# Patient Record
Sex: Male | Born: 1950
Health system: Southern US, Community
[De-identification: ages and names within clinical notes are randomized; demographics above are authoritative.]

## PROBLEM LIST (undated history)

## (undated) DIAGNOSIS — I2510 Atherosclerotic heart disease of native coronary artery without angina pectoris: Secondary | ICD-10-CM

## (undated) DIAGNOSIS — F329 Major depressive disorder, single episode, unspecified: Secondary | ICD-10-CM

## (undated) DIAGNOSIS — E785 Hyperlipidemia, unspecified: Secondary | ICD-10-CM

## (undated) DIAGNOSIS — I10 Essential (primary) hypertension: Secondary | ICD-10-CM

## (undated) DIAGNOSIS — M5416 Radiculopathy, lumbar region: Secondary | ICD-10-CM

## (undated) DIAGNOSIS — I209 Angina pectoris, unspecified: Secondary | ICD-10-CM

## (undated) DIAGNOSIS — I639 Cerebral infarction, unspecified: Secondary | ICD-10-CM

## (undated) DIAGNOSIS — Z86010 Personal history of colonic polyps: Secondary | ICD-10-CM

## (undated) DIAGNOSIS — F32A Depression, unspecified: Secondary | ICD-10-CM

## (undated) DIAGNOSIS — M1990 Unspecified osteoarthritis, unspecified site: Secondary | ICD-10-CM

## (undated) DIAGNOSIS — I714 Abdominal aortic aneurysm, without rupture: Secondary | ICD-10-CM

## (undated) DIAGNOSIS — N400 Enlarged prostate without lower urinary tract symptoms: Secondary | ICD-10-CM

## (undated) HISTORY — DX: Atherosclerotic heart disease of native coronary artery without angina pectoris: I25.10

## (undated) HISTORY — DX: Essential (primary) hypertension: I10

## (undated) HISTORY — DX: Personal history of colonic polyps: Z86.010

## (undated) HISTORY — DX: Radiculopathy, lumbar region: M54.16

## (undated) HISTORY — DX: Major depressive disorder, single episode, unspecified: F32.9

## (undated) HISTORY — DX: Benign prostatic hyperplasia without lower urinary tract symptoms: N40.0

## (undated) HISTORY — DX: Depression, unspecified: F32.A

## (undated) HISTORY — PX: CORONARY ANGIOPLASTY WITH STENT PLACEMENT: SHX49

## (undated) HISTORY — PX: VASECTOMY: SHX75

## (undated) HISTORY — DX: Hyperlipidemia, unspecified: E78.5

## (undated) HISTORY — DX: Cerebral infarction, unspecified: I63.9

## (undated) HISTORY — DX: Abdominal aortic aneurysm, without rupture: I71.4

---
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
## 1998-10-30 ENCOUNTER — Encounter: Payer: Self-pay | Admitting: *Deleted

## 1998-10-30 ENCOUNTER — Ambulatory Visit (HOSPITAL_COMMUNITY): Admission: RE | Admit: 1998-10-30 | Discharge: 1998-10-31 | Payer: Self-pay | Admitting: *Deleted

## 2003-06-01 HISTORY — PX: CYSTOSCOPY: SUR368

## 2003-10-23 ENCOUNTER — Observation Stay (HOSPITAL_COMMUNITY): Admission: EM | Admit: 2003-10-23 | Discharge: 2003-10-25 | Payer: Self-pay | Admitting: Emergency Medicine

## 2003-10-29 ENCOUNTER — Ambulatory Visit (HOSPITAL_COMMUNITY): Admission: RE | Admit: 2003-10-29 | Discharge: 2003-10-30 | Payer: Self-pay | Admitting: Cardiology

## 2004-10-27 ENCOUNTER — Ambulatory Visit: Payer: Self-pay | Admitting: *Deleted

## 2004-10-27 ENCOUNTER — Observation Stay (HOSPITAL_COMMUNITY): Admission: AD | Admit: 2004-10-27 | Discharge: 2004-10-28 | Payer: Self-pay | Admitting: *Deleted

## 2004-10-28 ENCOUNTER — Ambulatory Visit: Payer: Self-pay | Admitting: *Deleted

## 2004-11-17 ENCOUNTER — Ambulatory Visit: Payer: Self-pay | Admitting: *Deleted

## 2004-12-24 ENCOUNTER — Ambulatory Visit: Payer: Self-pay | Admitting: Internal Medicine

## 2005-10-27 ENCOUNTER — Ambulatory Visit: Payer: Self-pay | Admitting: *Deleted

## 2005-11-16 ENCOUNTER — Ambulatory Visit: Payer: Self-pay

## 2005-11-16 ENCOUNTER — Ambulatory Visit: Payer: Self-pay | Admitting: *Deleted

## 2005-11-24 ENCOUNTER — Ambulatory Visit: Payer: Self-pay | Admitting: Internal Medicine

## 2006-05-26 ENCOUNTER — Ambulatory Visit: Payer: Self-pay | Admitting: Internal Medicine

## 2006-05-26 LAB — CONVERTED CEMR LAB
ALT: 63 units/L — ABNORMAL HIGH (ref 0–40)
AST: 41 units/L — ABNORMAL HIGH (ref 0–37)
Albumin: 3.6 g/dL (ref 3.5–5.2)
Alkaline Phosphatase: 62 units/L (ref 39–117)
BUN: 11 mg/dL (ref 6–23)
Basophils Absolute: 0 10*3/uL (ref 0.0–0.1)
Basophils Relative: 0.5 % (ref 0.0–1.0)
CO2: 29 meq/L (ref 19–32)
Calcium: 9.1 mg/dL (ref 8.4–10.5)
Chloride: 104 meq/L (ref 96–112)
Chol/HDL Ratio, serum: 3.2
Cholesterol: 129 mg/dL (ref 0–200)
Creatinine, Ser: 0.9 mg/dL (ref 0.4–1.5)
Eosinophil percent: 3 % (ref 0.0–5.0)
GFR calc non Af Amer: 93 mL/min
Glomerular Filtration Rate, Af Am: 113 mL/min/{1.73_m2}
Glucose, Bld: 96 mg/dL (ref 70–99)
HCT: 42 % (ref 39.0–52.0)
HDL: 40.6 mg/dL (ref >39.0)
Hemoglobin: 14.9 g/dL (ref 13.0–17.0)
Hgb A1c MFr Bld: 5.2 % (ref 4.6–6.0)
LDL Cholesterol: 66 mg/dL (ref 0–99)
Lymphocytes Relative: 37.7 % (ref 12.0–46.0)
MCHC: 35.5 g/dL (ref 30.0–36.0)
MCV: 98.5 fL (ref 78.0–100.0)
Monocytes Absolute: 0.3 10*3/uL (ref 0.2–0.7)
Monocytes Relative: 7.4 % (ref 3.0–11.0)
Neutro Abs: 2.2 10*3/uL (ref 1.4–7.7)
Neutrophils Relative %: 51.4 % (ref 43.0–77.0)
PSA: 0.2 ng/mL (ref 0.10–4.00)
Platelets: 228 10*3/uL (ref 150–400)
Potassium: 3.8 meq/L (ref 3.5–5.1)
RBC: 4.27 M/uL (ref 4.22–5.81)
RDW: 12 % (ref 11.5–14.6)
Sodium: 139 meq/L (ref 135–145)
TSH: 1.67 microintl units/mL (ref 0.35–5.50)
Total Bilirubin: 0.9 mg/dL (ref 0.3–1.2)
Total Protein: 6.6 g/dL (ref 6.0–8.3)
Triglyceride fasting, serum: 110 mg/dL (ref 0–149)
VLDL: 22 mg/dL (ref 0–40)
WBC: 4.1 10*3/uL — ABNORMAL LOW (ref 4.5–10.5)

## 2006-05-31 DIAGNOSIS — Z860101 Personal history of adenomatous and serrated colon polyps: Secondary | ICD-10-CM

## 2006-05-31 DIAGNOSIS — Z86010 Personal history of colonic polyps: Secondary | ICD-10-CM

## 2006-05-31 HISTORY — DX: Personal history of colonic polyps: Z86.010

## 2006-05-31 HISTORY — DX: Personal history of adenomatous and serrated colon polyps: Z86.0101

## 2006-05-31 HISTORY — PX: COLONOSCOPY W/ POLYPECTOMY: SHX1380

## 2006-06-29 ENCOUNTER — Ambulatory Visit: Payer: Self-pay

## 2006-07-05 ENCOUNTER — Ambulatory Visit: Payer: Self-pay | Admitting: Gastroenterology

## 2006-07-08 ENCOUNTER — Ambulatory Visit: Payer: Self-pay | Admitting: *Deleted

## 2006-07-27 ENCOUNTER — Encounter (INDEPENDENT_AMBULATORY_CARE_PROVIDER_SITE_OTHER): Payer: Self-pay | Admitting: *Deleted

## 2006-07-27 ENCOUNTER — Ambulatory Visit: Payer: Self-pay | Admitting: Gastroenterology

## 2006-11-08 ENCOUNTER — Ambulatory Visit: Payer: Self-pay | Admitting: *Deleted

## 2006-11-08 ENCOUNTER — Ambulatory Visit: Payer: Self-pay

## 2006-11-08 LAB — CONVERTED CEMR LAB
ALT: 49 units/L — ABNORMAL HIGH (ref 0–40)
AST: 41 units/L — ABNORMAL HIGH (ref 0–37)
Albumin: 4 g/dL (ref 3.5–5.2)
Alkaline Phosphatase: 64 units/L (ref 39–117)
BUN: 14 mg/dL (ref 6–23)
CO2: 29 meq/L (ref 19–32)
Chloride: 101 meq/L (ref 96–112)
Creatinine, Ser: 0.9 mg/dL (ref 0.4–1.5)
Potassium: 3.9 meq/L (ref 3.5–5.1)
Total Bilirubin: 1.1 mg/dL (ref 0.3–1.2)
Total Protein: 6.6 g/dL (ref 6.0–8.3)
Triglycerides: 90 mg/dL (ref 0–149)

## 2006-11-29 ENCOUNTER — Ambulatory Visit: Payer: Self-pay | Admitting: Internal Medicine

## 2006-11-29 DIAGNOSIS — F3289 Other specified depressive episodes: Secondary | ICD-10-CM | POA: Insufficient documentation

## 2006-11-29 DIAGNOSIS — F329 Major depressive disorder, single episode, unspecified: Secondary | ICD-10-CM

## 2006-12-15 ENCOUNTER — Ambulatory Visit: Payer: Self-pay | Admitting: Internal Medicine

## 2006-12-15 LAB — CONVERTED CEMR LAB: T3, Free: 2.6 pg/mL (ref 2.3–4.2)

## 2006-12-22 ENCOUNTER — Ambulatory Visit: Payer: Self-pay | Admitting: Internal Medicine

## 2007-01-13 ENCOUNTER — Telehealth (INDEPENDENT_AMBULATORY_CARE_PROVIDER_SITE_OTHER): Payer: Self-pay | Admitting: *Deleted

## 2007-03-21 ENCOUNTER — Ambulatory Visit: Payer: Self-pay | Admitting: Cardiovascular Disease

## 2007-04-14 ENCOUNTER — Ambulatory Visit: Payer: Self-pay

## 2007-09-26 ENCOUNTER — Ambulatory Visit: Payer: Self-pay | Admitting: Cardiovascular Disease

## 2007-09-26 LAB — CONVERTED CEMR LAB
ALT: 50 units/L (ref 0–53)
Bilirubin, Direct: 0.1 mg/dL (ref 0.0–0.3)
Calcium: 9.3 mg/dL (ref 8.4–10.5)
Creatinine, Ser: 0.8 mg/dL (ref 0.4–1.5)
GFR calc Af Amer: 129 mL/min
GFR calc non Af Amer: 106 mL/min
HDL: 36.4 mg/dL — ABNORMAL LOW (ref >39.0)
LDL Cholesterol: 63 mg/dL (ref 0–99)
Sodium: 142 meq/L (ref 135–145)
Total Bilirubin: 0.9 mg/dL (ref 0.3–1.2)
Total CHOL/HDL Ratio: 3.3
Triglycerides: 105 mg/dL (ref 0–149)
VLDL: 21 mg/dL (ref 0–40)

## 2007-12-07 ENCOUNTER — Ambulatory Visit: Payer: Self-pay

## 2008-03-27 ENCOUNTER — Encounter: Payer: Self-pay | Admitting: Internal Medicine

## 2008-03-28 ENCOUNTER — Ambulatory Visit: Payer: Self-pay | Admitting: Internal Medicine

## 2008-03-28 DIAGNOSIS — I10 Essential (primary) hypertension: Secondary | ICD-10-CM | POA: Insufficient documentation

## 2008-03-28 DIAGNOSIS — T887XXA Unspecified adverse effect of drug or medicament, initial encounter: Secondary | ICD-10-CM | POA: Insufficient documentation

## 2008-03-28 DIAGNOSIS — IMO0002 Reserved for concepts with insufficient information to code with codable children: Secondary | ICD-10-CM | POA: Insufficient documentation

## 2008-03-28 DIAGNOSIS — N400 Enlarged prostate without lower urinary tract symptoms: Secondary | ICD-10-CM | POA: Insufficient documentation

## 2008-03-28 DIAGNOSIS — D126 Benign neoplasm of colon, unspecified: Secondary | ICD-10-CM | POA: Insufficient documentation

## 2008-03-28 DIAGNOSIS — E785 Hyperlipidemia, unspecified: Secondary | ICD-10-CM | POA: Insufficient documentation

## 2008-03-28 DIAGNOSIS — I2510 Atherosclerotic heart disease of native coronary artery without angina pectoris: Secondary | ICD-10-CM | POA: Insufficient documentation

## 2008-03-28 LAB — CONVERTED CEMR LAB
Bilirubin Urine: NEGATIVE
Glucose, Urine, Semiquant: NEGATIVE
Ketones, urine, test strip: NEGATIVE
Protein, U semiquant: NEGATIVE
Urobilinogen, UA: NEGATIVE
pH: 6

## 2008-04-01 LAB — CONVERTED CEMR LAB
Basophils Absolute: 0.1 10*3/uL (ref 0.0–0.1)
Basophils Relative: 1.3 % (ref 0.0–3.0)
Bilirubin, Direct: 0.1 mg/dL (ref 0.0–0.3)
Calcium: 9.1 mg/dL (ref 8.4–10.5)
Cholesterol: 108 mg/dL (ref 0–200)
Creatinine, Ser: 0.9 mg/dL (ref 0.4–1.5)
Eosinophils Absolute: 0.1 10*3/uL (ref 0.0–0.7)
GFR calc Af Amer: 112 mL/min
GFR calc non Af Amer: 92 mL/min
Hemoglobin: 15.4 g/dL (ref 13.0–17.0)
LDL Cholesterol: 49 mg/dL (ref 0–99)
Lymphocytes Relative: 36.3 % (ref 12.0–46.0)
MCHC: 34.7 g/dL (ref 30.0–36.0)
MCV: 99 fL (ref 78.0–100.0)
Neutro Abs: 2.3 10*3/uL (ref 1.4–7.7)
PSA: 0.19 ng/mL (ref 0.10–4.00)
RBC: 4.48 M/uL (ref 4.22–5.81)
RDW: 12 % (ref 11.5–14.6)
Sodium: 140 meq/L (ref 135–145)
TSH: 1.14 microintl units/mL (ref 0.35–5.50)
Total Bilirubin: 1 mg/dL (ref 0.3–1.2)
Triglycerides: 107 mg/dL (ref 0–149)
VLDL: 21 mg/dL (ref 0–40)

## 2008-04-02 ENCOUNTER — Encounter (INDEPENDENT_AMBULATORY_CARE_PROVIDER_SITE_OTHER): Payer: Self-pay | Admitting: *Deleted

## 2008-04-12 ENCOUNTER — Ambulatory Visit: Payer: Self-pay | Admitting: Cardiovascular Disease

## 2008-05-13 ENCOUNTER — Ambulatory Visit: Payer: Self-pay | Admitting: Cardiovascular Disease

## 2008-05-13 LAB — CONVERTED CEMR LAB
AST: 51 units/L — ABNORMAL HIGH (ref 0–37)
Albumin: 3.8 g/dL (ref 3.5–5.2)
Alkaline Phosphatase: 63 units/L (ref 39–117)
Bilirubin, Direct: 0.1 mg/dL (ref 0.0–0.3)
Total Bilirubin: 0.8 mg/dL (ref 0.3–1.2)

## 2008-06-19 ENCOUNTER — Ambulatory Visit: Payer: Self-pay

## 2008-11-05 DIAGNOSIS — E669 Obesity, unspecified: Secondary | ICD-10-CM | POA: Insufficient documentation

## 2008-11-05 DIAGNOSIS — Z9889 Other specified postprocedural states: Secondary | ICD-10-CM | POA: Insufficient documentation

## 2008-11-08 ENCOUNTER — Ambulatory Visit: Payer: Self-pay | Admitting: Cardiovascular Disease

## 2008-11-26 ENCOUNTER — Ambulatory Visit: Payer: Self-pay | Admitting: Internal Medicine

## 2008-11-26 DIAGNOSIS — M545 Low back pain, unspecified: Secondary | ICD-10-CM | POA: Insufficient documentation

## 2008-12-13 ENCOUNTER — Ambulatory Visit: Payer: Self-pay

## 2008-12-13 ENCOUNTER — Encounter: Payer: Self-pay | Admitting: Cardiovascular Disease

## 2009-05-02 ENCOUNTER — Ambulatory Visit: Payer: Self-pay | Admitting: Cardiovascular Disease

## 2009-05-02 DIAGNOSIS — I10 Essential (primary) hypertension: Secondary | ICD-10-CM | POA: Insufficient documentation

## 2009-06-16 ENCOUNTER — Ambulatory Visit: Payer: Self-pay | Admitting: Cardiovascular Disease

## 2009-06-23 LAB — CONVERTED CEMR LAB
ALT: 98 units/L — ABNORMAL HIGH (ref 0–53)
Alkaline Phosphatase: 59 units/L (ref 39–117)
Bilirubin, Direct: 0.1 mg/dL (ref 0.0–0.3)
CO2: 24 meq/L (ref 19–32)
Chloride: 108 meq/L (ref 96–112)
Creatinine, Ser: 1 mg/dL (ref 0.4–1.5)
Glucose, Bld: 103 mg/dL — ABNORMAL HIGH (ref 70–99)
Total Protein: 7.1 g/dL (ref 6.0–8.3)

## 2009-07-01 ENCOUNTER — Telehealth: Payer: Self-pay | Admitting: Cardiovascular Disease

## 2009-07-14 ENCOUNTER — Ambulatory Visit: Payer: Self-pay

## 2009-07-14 ENCOUNTER — Encounter: Payer: Self-pay | Admitting: Cardiovascular Disease

## 2009-11-24 ENCOUNTER — Encounter: Payer: Self-pay | Admitting: Cardiovascular Disease

## 2009-11-25 ENCOUNTER — Ambulatory Visit: Payer: Self-pay | Admitting: Cardiovascular Disease

## 2009-12-08 ENCOUNTER — Encounter: Payer: Self-pay | Admitting: Cardiovascular Disease

## 2009-12-12 ENCOUNTER — Ambulatory Visit: Payer: Self-pay | Admitting: Cardiovascular Disease

## 2009-12-25 LAB — CONVERTED CEMR LAB
AST: 76 units/L — ABNORMAL HIGH (ref 0–37)
Alkaline Phosphatase: 70 units/L (ref 39–117)
BUN: 21 mg/dL (ref 6–23)
CO2: 31 meq/L (ref 19–32)
Calcium: 9.2 mg/dL (ref 8.4–10.5)
Creatinine, Ser: 0.8 mg/dL (ref 0.4–1.5)
Glucose, Bld: 103 mg/dL — ABNORMAL HIGH (ref 70–99)
Total Bilirubin: 1 mg/dL (ref 0.3–1.2)
Total CHOL/HDL Ratio: 3

## 2010-01-09 ENCOUNTER — Encounter: Payer: Self-pay | Admitting: Cardiovascular Disease

## 2010-01-12 ENCOUNTER — Ambulatory Visit: Payer: Self-pay

## 2010-01-12 ENCOUNTER — Encounter: Payer: Self-pay | Admitting: Cardiovascular Disease

## 2010-03-31 ENCOUNTER — Ambulatory Visit: Payer: Self-pay | Admitting: Cardiovascular Disease

## 2010-06-11 ENCOUNTER — Encounter: Payer: Self-pay | Admitting: Cardiovascular Disease

## 2010-06-11 ENCOUNTER — Other Ambulatory Visit: Payer: Self-pay | Admitting: Cardiovascular Disease

## 2010-06-11 ENCOUNTER — Ambulatory Visit
Admission: RE | Admit: 2010-06-11 | Discharge: 2010-06-11 | Payer: Self-pay | Source: Home / Self Care | Attending: Cardiovascular Disease | Admitting: Cardiovascular Disease

## 2010-06-11 LAB — LIPID PANEL
Cholesterol: 138 mg/dL (ref 0–200)
HDL: 45 mg/dL (ref >39.00)
LDL Cholesterol: 71 mg/dL (ref 0–99)
Total CHOL/HDL Ratio: 3
Triglycerides: 110 mg/dL (ref 0.0–149.0)
VLDL: 22 mg/dL (ref 0.0–40.0)

## 2010-06-11 LAB — HEPATIC FUNCTION PANEL
ALT: 80 U/L — ABNORMAL HIGH (ref 0–53)
AST: 59 U/L — ABNORMAL HIGH (ref 0–37)
Albumin: 4.1 g/dL (ref 3.5–5.2)
Alkaline Phosphatase: 66 U/L (ref 39–117)
Bilirubin, Direct: 0.2 mg/dL (ref 0.0–0.3)
Total Bilirubin: 1.2 mg/dL (ref 0.3–1.2)
Total Protein: 7 g/dL (ref 6.0–8.3)

## 2010-06-11 LAB — BASIC METABOLIC PANEL
BUN: 19 mg/dL (ref 6–23)
CO2: 29 mEq/L (ref 19–32)
Calcium: 9.1 mg/dL (ref 8.4–10.5)
Chloride: 98 mEq/L (ref 96–112)
Creatinine, Ser: 0.9 mg/dL (ref 0.4–1.5)
GFR: 94.14 mL/min (ref >60.00)
Glucose, Bld: 103 mg/dL — ABNORMAL HIGH (ref 70–99)
Potassium: 3.9 mEq/L (ref 3.5–5.1)
Sodium: 135 mEq/L (ref 135–145)

## 2010-06-28 LAB — CONVERTED CEMR LAB
ALT: 104 units/L — ABNORMAL HIGH (ref 0–53)
Chloride: 105 meq/L (ref 96–112)
GFR calc non Af Amer: 81.54 mL/min (ref >60)
Glucose, Bld: 108 mg/dL — ABNORMAL HIGH (ref 70–99)
HDL: 44 mg/dL (ref >39.00)
LDL Cholesterol: 74 mg/dL (ref 0–99)
Potassium: 5.6 meq/L — ABNORMAL HIGH (ref 3.5–5.1)
Sodium: 142 meq/L (ref 135–145)
Total Bilirubin: 2 mg/dL — ABNORMAL HIGH (ref 0.3–1.2)
Total CHOL/HDL Ratio: 3
VLDL: 18.4 mg/dL (ref 0.0–40.0)

## 2010-06-30 NOTE — Miscellaneous (Signed)
Summary: Orders Update  Clinical Lists Changes  Orders: Added new Test order of Abdominal Aorta Duplex (Abd Aorta Duplex) - Signed 

## 2010-06-30 NOTE — Progress Notes (Signed)
Summary: med question  Phone Note Call from Patient Call back at Home Phone [phone]   Caller: Patient Reason for Call: Talk to Nurse Summary of Call: needs to know what over the counter meds he can take for cough Initial call taken by: Migdalia Dk,  July 01, 2009 9:07 AM  Follow-up for Phone Call        ok to take Robitussin OTC.  On Sat was blowing leaves and thats when it all started.  Feels like it is progressing.  He is going to go to an Urgent Care near where he lives.  He says he is 30 miles away from Dr [name].  He will call back if needed. Dennis Bast, RN, BSN  July 01, 2009 10:43 AM

## 2010-06-30 NOTE — Assessment & Plan Note (Signed)
Summary: ROV   Visit Type:  Follow-up Primary Provider:  Marga Melnick MD  CC:  No cardiac complains.  History of Present Illness: This is a 60 year old gentleman with coronary artery disease, hypertension and abdominal aortic aneurysm.  He has undergone stenting of the left circumflex and right coronary artery several years back and had a relook catheterization in 2006 when his stents were widely patent.   His most recent abdominal ultrasound was performed in Feb 2011 demonstrating his AAA at 4.8x4.8 in maximum dimension.  He continues to do hard physical work and has no exertional complaints. He specifically denies CP, dyspnea, edema, orthopnea, or PND. No abdominal pain.   Current Medications (verified): 1)  Toprol Xl 100 Mg Xr24h-Tab (Metoprolol Succinate) .... Take One Tablet By Mouth Daily 2)  Plavix 75 Mg  Tabs (Clopidogrel Bisulfate) .Marland Kitchen.. 1 By Mouth Qd 3)  Crestor 10 Mg Tabs (Rosuvastatin Calcium) .... Take One-Half  Tablet By Mouth Daily. 4)  Aspirin 81 Mg Tbec (Aspirin) .... Take One Tablet By Mouth Daily 5)  Multivitamins  Tabs (Multiple Vitamin) .... Take 1 Tablet By Mouth Once A Day 6)  Fish Oil 1200 Mg Caps (Omega-3 Fatty Acids) .... Take 1 Capsule By Mouth Once A Day 7)  Micardis Hct 40-12.5 Mg Tabs (Telmisartan-Hctz) .... Take 1 Tablet By Mouth Once A Day  Allergies (verified): No Known Drug Allergies  Past History:  Past medical history reviewed for relevance to current acute and chronic problems.  Past Medical History: Current Problems:  CAD (ICD-414.00)- He underwent stenting of his circumflex and right coronary artery with drug-eluting stents. ABDOMINAL AORTIC ANEURYSM (ICD-441.4) - 4.8x4.8 cm in 2011 followed by serial duplex ultrasound UNSPECIFIED ESSENTIAL HYPERTENSION (ICD-401.9) DYSLIPIDEMIA (ICD-272.4) OBESITY (ICD-278.00) LUMBAR RADICULOPATHY, LEFT (ICD-724.4) ROUTINE GENERAL MEDICAL EXAM@HEALTH  CARE FACL (ICD-V70.0) HYPERPLASIA PROSTATE UNS W/O UR  OBST & OTH LUTS (ICD-600.90) UNS ADVRS EFF UNS RX MEDICINAL&BIOLOGICAL SBSTNC (ICD-995.20) OTHER AND UNSPECIFIED HYPERLIPIDEMIA (ICD-272.4) COLONIC POLYPS, ADENOMATOUS (ICD-211.3) DEPRESSION (ICD-311)  Review of Systems       Positive for erectile dysfunction, otherwise negative except as per HPI   Vital Signs:  Patient profile:   60 year old male Height:      72 inches Weight:      281.75 pounds BMI:     38.35 Pulse rate:   60 / minute Pulse rhythm:   regular Resp:     18 per minute BP sitting:   124 / 90  (left arm) Cuff size:   large  Vitals Entered By: Vikki Ports (November 25, 2009 8:58 AM)  Physical Exam  General:  Pt is alert and oriented, obese male, in no acute distress. HEENT: normal Neck: normal carotid upstrokes without bruits, JVP normal Lungs: CTA CV: RRR without murmur or gallop Abd: soft, NT, positive BS, no bruit, no organomegaly Ext: no clubbing, cyanosis, or edema. peripheral pulses 2+ and equal Skin: warm and dry without rash    EKG  Procedure date:  11/25/2009  Findings:      NSR, HR 62 bpm, within normal limits.  Impression & Recommendations:  Problem # 1:  CAD (ICD-414.00) Stable without angina. Continue current Rx with ASA, plavix, beta-blocker.  His updated medication list for this problem includes:    Toprol Xl 100 Mg Xr24h-tab (Metoprolol succinate) .Marland Kitchen... Take one tablet by mouth daily    Plavix 75 Mg Tabs (Clopidogrel bisulfate) .Marland Kitchen... 1 by mouth qd    Aspirin 81 Mg Tbec (Aspirin) .Marland Kitchen... Take one tablet by mouth daily  Orders: EKG w/ Interpretation (93000)  Problem # 2:  ABDOMINAL AORTIC ANEURYSM (ICD-441.4) AAA approaching 5 cm. Continue close observation with serial ultrasound.  Problem # 3:  UNSPECIFIED ESSENTIAL HYPERTENSION (ICD-401.9) Diastolic BP borderline. In setting of AAA, we need to be aggressive with BP control. Will increase his Micardis-HCT to 80/25 mg daily.  His updated medication list for this problem  includes:    Toprol Xl 100 Mg Xr24h-tab (Metoprolol succinate) .Marland Kitchen... Take one tablet by mouth daily    Aspirin 81 Mg Tbec (Aspirin) .Marland Kitchen... Take one tablet by mouth daily    Micardis Hct 80-25 Mg Tabs (Telmisartan-hctz) .Marland Kitchen... Take one tablet by mouth once a day  Problem # 4:  DYSLIPIDEMIA (ICD-272.4) Lipids reviewed and pt is at goal. Will repeat since crestor dose has been lowered and check LFT's as well. LFT's have been elevated but less than 3x ULN  His updated medication list for this problem includes:    Crestor 10 Mg Tabs (Rosuvastatin calcium) .Marland Kitchen... Take one-half  tablet by mouth daily.  CHOL: 136 (05/02/2009)   LDL: 74 (05/02/2009)   HDL: 44.00 (05/02/2009)   TG: 92.0 (05/02/2009)  Patient Instructions: 1)  Your physician has recommended you make the following change in your medication: INCREASE Micardis HCT to 80/25mg  once a day 2)  Your physician recommends that you return for a FASTING LIPID, LIVER, BMP in 2 WEEKS (Nothing to eat or drink after midnight)--414.01, 272.0  3)  Your physician wants you to follow-up in:  6 MONTHS.  You will receive a reminder letter in the mail two months in advance. If you don't receive a letter, please call our office to schedule the follow-up appointment. Prescriptions: MICARDIS HCT 80-25 MG TABS (TELMISARTAN-HCTZ) take one tablet by mouth once a day  #30 x 8   Entered by:   Julieta Gutting, RN, BSN   Authorized by:   Norva Karvonen, MD   Signed by:   Julieta Gutting, RN, BSN on 11/25/2009   Method used:   Electronically to        [facility]  [address] [phone]* (retail)       [address]       [place], [place]  [address]       Ph: [phone] or [phone]       Fax: [phone]   RxID:   [id number]

## 2010-06-30 NOTE — Miscellaneous (Signed)
  Clinical Lists Changes  Observations: Added new observation of ABDOM US: Stable dimensions on infrarenal, distal AAA at 4.7cmX 4.8cm  Dilated but stable iliac arteries  f/u 6 months (12/13/2008 10:35)      [place] of Abdomen  Procedure date:  12/13/2008  Findings:      Stable dimensions on infrarenal, distal AAA at 4.7cmX 4.8cm  Dilated but stable iliac arteries  f/u 6 months

## 2010-06-30 NOTE — Letter (Signed)
Summary: [facility] Read[facility]   [facility] Read[facility]   Imported By: Roderic Ovens 12/12/2009 12:50:57  _____________________________________________________________________  External Attachment:    Type:   Image     Comment:   External Document

## 2010-07-02 NOTE — Assessment & Plan Note (Signed)
Summary: f79m   Visit Type:  Follow-up Primary Provider:  Marga Melnick MD  CC:  no complaints.  History of Present Illness: This is a 60 year old gentleman with coronary artery disease, hypertension and abdominal aortic aneurysm.  He has undergone stenting of the left circumflex and right coronary artery several years back and had a relook catheterization in 2006 when his stents were widely patent.   His most recent abdominal ultrasound was performed in August 2011 demonstrating his AAA at 4.8x4.9 cm in maximum dimension. This was stable from his prior study.  The patient denies chest pain, dyspnea, orthopnea, PND, edema, palpitations, lightheadedness, or syncope.  He admits to seasonal depression - this is longstanding. He has been drinking alcohol heavily.  Current Medications (verified): 1)  Toprol Xl 100 Mg Xr24h-Tab (Metoprolol Succinate) .... Take One Tablet By Mouth Daily 2)  Plavix 75 Mg  Tabs (Clopidogrel Bisulfate) .Marland Kitchen.. 1 By Mouth Qd 3)  Crestor 10 Mg Tabs (Rosuvastatin Calcium) .... Take One-Half  Tablet By Mouth Daily. 4)  Aspirin 81 Mg Tbec (Aspirin) .... Take One Tablet By Mouth Daily 5)  Multivitamins  Tabs (Multiple Vitamin) .... Take 1 Tablet By Mouth Once A Day 6)  Fish Oil 1200 Mg Caps (Omega-3 Fatty Acids) .... Take 1 Capsule By Mouth Once A Day 7)  Micardis Hct 80-25 Mg Tabs (Telmisartan-Hctz) .... Take One Tablet By Mouth Once A Day  Allergies (verified): No Known Drug Allergies  Past History:  Past medical history reviewed for relevance to current acute and chronic problems.  Past Medical History: Reviewed history from 11/25/2009 and no changes required. Current Problems:  CAD (ICD-414.00)- He underwent stenting of his circumflex and right coronary artery with drug-eluting stents. ABDOMINAL AORTIC ANEURYSM (ICD-441.4) - 4.8x4.8 cm in 2011 followed by serial duplex ultrasound UNSPECIFIED ESSENTIAL HYPERTENSION (ICD-401.9) DYSLIPIDEMIA (ICD-272.4) OBESITY  (ICD-278.00) LUMBAR RADICULOPATHY, LEFT (ICD-724.4) ROUTINE GENERAL MEDICAL EXAM@HEALTH  CARE FACL (ICD-V70.0) HYPERPLASIA PROSTATE UNS W/O UR OBST & OTH LUTS (ICD-600.90) UNS ADVRS EFF UNS RX MEDICINAL&BIOLOGICAL SBSTNC (ICD-995.20) OTHER AND UNSPECIFIED HYPERLIPIDEMIA (ICD-272.4) COLONIC POLYPS, ADENOMATOUS (ICD-211.3) DEPRESSION (ICD-311)  Review of Systems       Negative except as per HPI   Vital Signs:  Patient profile:   60 year old male Height:      72 inches Weight:      281 pounds BMI:     38.25 Pulse rate:   69 / minute BP sitting:   112 / 80  (left arm) Cuff size:   large  Vitals Entered By: Hardin Negus, RMA (June 11, 2010 8:38 AM)  Physical Exam  General:  Pt is alert and oriented, obese male, in no acute distress. HEENT: normal Neck: normal carotid upstrokes without bruits, JVP normal Lungs: CTA CV: RRR without murmur or gallop Abd: soft, NT, positive BS, no bruit, no organomegaly Ext: no clubbing, cyanosis, or edema. peripheral pulses 2+ and equal Skin: warm and dry without rash    EKG  Procedure date:  06/11/2010  Findings:      NSR, within normal limits, HR 69 bpm.  Impression & Recommendations:  Problem # 1:  CAD (ICD-414.00) Stable without angina. Continue current medical program.  His updated medication list for this problem includes:    Toprol Xl 100 Mg Xr24h-tab (Metoprolol succinate) .Marland Kitchen... Take one tablet by mouth daily    Plavix 75 Mg Tabs (Clopidogrel bisulfate) .Marland Kitchen... 1 by mouth qd    Aspirin 81 Mg Tbec (Aspirin) .Marland Kitchen... Take one tablet by mouth daily  Orders:  EKG w/ Interpretation (93000) Abdominal Aorta Duplex (Abd Aorta Duplex) TLB-BMP (Basic Metabolic Panel-BMET) (80048-METABOL) TLB-Lipid Panel (80061-LIPID) TLB-Hepatic/Liver Function Pnl (80076-HEPATIC)  Problem # 2:  ABDOMINAL AORTIC ANEURYSM (ICD-441.4) Continue with serial duplex scans. Pt with tight blood pressure control.  Problem # 3:  HYPERTENSION, BENIGN  (ICD-401.1) Well-controlled.  His updated medication list for this problem includes:    Toprol Xl 100 Mg Xr24h-tab (Metoprolol succinate) .Marland Kitchen... Take one tablet by mouth daily    Aspirin 81 Mg Tbec (Aspirin) .Marland Kitchen... Take one tablet by mouth daily    Micardis Hct 80-25 Mg Tabs (Telmisartan-hctz) .Marland Kitchen... Take one tablet by mouth once a day  Orders: Abdominal Aorta Duplex (Abd Aorta Duplex) TLB-BMP (Basic Metabolic Panel-BMET) (80048-METABOL) TLB-Lipid Panel (80061-LIPID) TLB-Hepatic/Liver Function Pnl (80076-HEPATIC)  Problem # 4:  DYSLIPIDEMIA (ICD-272.4) Lipids have been at goal. LFT's increased but less than 3x ULN. Continue current med program, followup lipids and LFT's today, and discussed reduction in alcohol intake at length.  His updated medication list for this problem includes:    Crestor 10 Mg Tabs (Rosuvastatin calcium) .Marland Kitchen... Take one-half  tablet by mouth daily.  Orders: Abdominal Aorta Duplex (Abd Aorta Duplex) TLB-BMP (Basic Metabolic Panel-BMET) (80048-METABOL) TLB-Lipid Panel (80061-LIPID) TLB-Hepatic/Liver Function Pnl (80076-HEPATIC)  CHOL: 140 (12/12/2009)   LDL: 76 (12/12/2009)   HDL: 43.80 (12/12/2009)   TG: 103.0 (12/12/2009)  Patient Instructions: 1)  Your physician recommends that you have lab work today: BMP, LIVER, LIPID 2)  Your physician recommends that you continue on your current medications as directed. Please refer to the Current Medication list given to you today. 3)  Your physician wants you to follow-up in:  6 MONTHS. You will receive a reminder letter in the mail two months in advance. If you don't receive a letter, please call our office to schedule the follow-up appointment. 4)  Your physician has requested that you have an abdominal aorta duplex in [place]. During this test, an ultrasound is used to evaluate the aorta. Allow 30 minutes for this exam. Do not eat after midnight the day before and avoid carbonated beverages. There are no restrictions or  special instructions.

## 2010-07-09 ENCOUNTER — Encounter (INDEPENDENT_AMBULATORY_CARE_PROVIDER_SITE_OTHER): Payer: Commercial Indemnity

## 2010-07-09 ENCOUNTER — Encounter: Payer: Self-pay | Admitting: Cardiovascular Disease

## 2010-07-09 DIAGNOSIS — I714 Abdominal aortic aneurysm, without rupture, unspecified: Secondary | ICD-10-CM

## 2010-09-01 ENCOUNTER — Other Ambulatory Visit: Payer: Self-pay | Admitting: Cardiovascular Disease

## 2010-10-13 NOTE — Assessment & Plan Note (Signed)
[facility]                            CARDIOLOGY OFFICE NOTE   Alfred [name]                         MRN:          [id number]  DATE:09/26/2007                            DOB:          Feb 22, 1951    Alfred [name] returns for follow-up at the [facility] Cardiology office on  September 26, 2007.  Alfred [name] is a very nice 60 year old gentleman with  coronary artery disease and abdominal aortic aneurysm.  He underwent  stenting of his circumflex and right coronary artery with drug-eluting  stents in the past and at his most recent catheterization in 2006, these  stents were widely patent.  He also has an infrarenal AAA most recently  measured at just over 4 cm in diameter back in November 2008.   From a symptomatic standpoint Alfred [name] is doing well.  He has been  sedentary over the winter.  He had some depression back last fall but  this is now improved.  He has no chest pain, dyspnea, orthopnea, PND,  edema, palpitations, lightheadedness or syncope.  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   MEDICATIONS:  1. Diovan/HCT 80/12.5 mg daily.  2. Vytorin 10/20 mg daily.  3. Plavix 75 mg daily.  4. Aspirin 81 mg daily.  5. Multivitamin daily.  6. Metoprolol 50 mg daily.  7. Fish oil 1200 mg daily.  8. Fluoxetine 20 mg b.i.d.   ALLERGIES:  No known drug allergies.   On exam the patient is alert and oriented, in no distress.  Weight is 270.  That is up from 261 at his last visit in October.  Blood  pressure 121/80, heart rate 63, respiratory rate 12.  HEENT:  Normal.  NECK:  Normal carotid upstrokes without bruits.  Jugular venous pressure  is normal.  LUNGS:  Clear to auscultation bilaterally.  HEART:  Regular rate and rhythm without murmurs or gallops.  ABDOMEN:  Soft, nontender.  No organomegaly.  No bruits.  No masses.  BACK:  No CVA tenderness.  EXTREMITIES:  No clubbing, cyanosis or edema.   Peripheral pulses 2+ and  equal throughout.   EKG shows sinus rhythm and is within normal limits.  Heart rate 61 beats  per minute.   ASSESSMENT:  1. Coronary artery disease, status post drug-eluting stents in the      right coronary artery and left circumflex.  Remains asymptomatic      with hard physical activity.  Continue dual antiplatelet therapy      with aspirin and Plavix as he is tolerating well.  Continue      secondary risk reduction as outlined below.  Encourage weight loss      and regular aerobic activity.  2. Infrarenal abdominal aortic aneurysm.  Follow-up abdominal      ultrasound in May of this year.  Most recent measurements were 4.1      x 4.1 cm on April 14, 2007.  3. Dyslipidemia.  Cholesterol 125, triglycerides 90, HDL 40, LDL 67.      This  is on Vytorin 10/20 mg.  Repeat lipids and LFTs today.  4. Hypertension.  Blood pressure under ideal control.  5. For follow-up, Alfred [name] will have his abdominal ultrasound in May.      I would like to see him back in 6 months.     Veverly Fells. Excell Seltzer, MD  Electronically Signed    MDC/MedQ  DD: 09/26/2007  DT: 09/26/2007  Job #: [id number]   cc:   Titus Dubin. [name], MD,FACP,FCCP

## 2010-10-13 NOTE — Assessment & Plan Note (Signed)
[facility]                            CARDIOLOGY OFFICE NOTE   Alfred, [name]                         MRN:          [id number]  DATE:04/12/2008                            DOB:          March 10, 1951    [name] returns for followup with the [facility] Cardiology Office on  April 12, 2008.  He is a 60 year old gentleman with coronary artery  disease, hypertension and abdominal aortic aneurysm.  He has undergone  stenting of the left circumflex and right coronary artery several years  back and had a relook catheterization in 2006 when his stents were  widely patent.  His abdominal aortic aneurysm has been followed by  serial ultrasound and more recently a CT angiogram was done.  He is  ultrasound findings were reviewed and his aneurysm has increased from  2.9 cm in diameter in 2000 to 3.4 cm in 2004, up to now 4.5 cm.  His CT  angiogram confirmed this size with a maximum dimension of 4.2 x 4.5 cm  in the transverse and AP dimensions respectively.   From a symptomatic standpoint, he is doing well.  He continues to do  hard work and has no symptoms.  He denies chest pain, dyspnea, edema,  orthopnea, or PND.  He does have some lightheadedness when he first  stands up.  He has no other complaints.  He specifically denies  abdominal pain.   MEDICATIONS:  1. Diovan/HCT 80/12.5 mg daily.  2. Vytorin 10/20 mg daily.  3. Plavix 75 mg daily.  4. Aspirin 81 mg daily.  5. Multivitamin 1 daily.  6. Metoprolol 50 mg daily.  7. Fish oil 1200 mg daily.  8. Keflex.   ALLERGIES:  NKDA.   PHYSICAL EXAMINATION:  GENERAL:  The patient is alert and oriented obese  male in no acute distress.  VITAL SIGNS:  Weight 275 pounds, blood pressure 142/90, heart rate 70,  and respiratory rate is 12.  HEENT:  Normal.  NECK:  Normal carotid upstrokes.  No bruits.  JVP normal.  LUNGS:  Clear bilaterally.  HEART:  Regular rate and rhythm.  No murmurs or gallops.  ABDOMEN:   Soft and nontender no organomegaly.  No masses.  Aneurysm not  palpable.  EXTREMITIES:  No clubbing, cyanosis, or edema.  Peripheral pulses intact  and equal.  SKIN:  Warm and dry without rash.   EKG shows normal sinus rhythm and is within normal limits.   ASSESSMENT:  1. Coronary artery disease, status post percutaneous coronary      intervention.  The patient remains asymptomatic.  He has no angina      or other complaints.  Continue his medical program with aspirin,      Plavix, beta-blocker and statin.  I am going to increase his Toprol      dose to 75 mg of metoprolol succinate at bedtime.  This in the      setting of elevated blood pressure and abdominal aortic      aneurysm/coronary artery disease.  2. Abdominal aortic aneurysm.  He is due for followup abdominal  ultrasound in January.  We will try to improve blood pressure      control as outlined above and increase his beta-blocker dose.  3. Hypertension.  He has a $50 cope a with Diovan/hydrochlorothiazide.      I switched him to Micardis/hydrochlorothiazide 40/12.5 mg.      Hopefully, this is a lower tear drug for his cope.  4. Dyslipidemia.  He remains on Vytorin and fish oil.  His liver      function tests were elevated at last check on March 28, 2008.      There are still within 3 times upper limit normal.  He also has      moderate alcohol consumption, but is not plan on reducing that.  We      will repeat lipids and liver function tests in approximately 4      weeks and he will followup with Dr. [name] for this as well.   For followup, I would like to see [name] in 6 months.  We will be in  touch at the time of his followup labs and followup abdominal  ultrasound.     Alfred [name]. Excell Seltzer, MD  Electronically Signed    MDC/MedQ  DD: 04/12/2008  DT: 04/12/2008  Job #: [id number]   cc:   Titus Dubin. [name], MD,FACP,FCCP

## 2010-10-13 NOTE — Assessment & Plan Note (Signed)
[facility]                            CARDIOLOGY OFFICE NOTE   NAME:Alfred [name], Alfred [name]                         MRN:          [id number]  DATE:03/21/2007                            DOB:          Jul 26, 1950    Alfred [name] was seen in followup at the [facility] Cardiology office on  March 21, 2007. Alfred [name] is a 60 year old gentleman with coronary  artery disease and abdominal aortic aneurysm who presents today for a  followup. He has been a patient of Joe [facility]wen's and I will be assuming  his care from here forward.   Alfred [name] remains very physically active. He does not participate in  regular aerobic exercise but he works very hard. 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 With exertion he denies any chest  pain or pressure. He has no dyspnea, orthopnea, PND or palpitations. He  really has no complaints at this point.   Alfred [name] has undergone stenting of both his circumflex and right  coronary artery with CYPHER drug-eluting stents. He has also had  angioplasty of the PDA. His most recent catheterization in 2006  demonstrated patent stents and nonobstructive CAD as well as normal left  ventricular function.   He has also had a small infrarenal abdominal aortic aneurysm that has  been followed over time. His most recent aneurysm measurements were 4.1  x 4.1 cm. This had increased from the previous year where the aneurysm  measured 3.8 x 3.8 cm by ultrasound.   CURRENT MEDICATIONS:  1. Diovan HCT 80/12.5 mg daily.  2. Vytorin 10/20 mg daily.  3. Plavix 75 mg daily.  4. Aspirin 81 mg daily.  5. Multivitamin daily.  6. Omega 3 daily.  7. Metoprolol succinate 50 mg daily.  8. Fish oil 1200 mg daily.  9. Fluoxetine HCl 20 mg twice daily.   ALLERGIES:  NKDA.   PHYSICAL EXAMINATION:  The patient is alert and oriented, he is in no  acute distress.  Weight is 261, blood pressure is 120/80, heart rate is 57, respiratory  rate 16.  HEENT:  Normal.  NECK:  Normal. Carotid upstrokes without bruits. Jugular venous pressure  is normal.  LUNGS:  Clear to auscultation bilaterally.  HEART:  Regular rate and rhythm without murmurs or gallops.  ABDOMEN:  Soft, nontender, obese, no organomegaly.  EXTREMITIES:  No clubbing, cyanosis or edema. Peripheral pulses 2+ and  equal throughout.   EKG shows sinus bradycardia and is otherwise within normal limits.   ASSESSMENT:  1. Coronary artery disease. Alfred [name] remains on a stable medical      regimen and has no symptoms of angina. He has an excellent capacity      for physical exertion. I did not recommend any changes in his      medical regimen today. I would like to see him back in 6 months for      followup.  2. Dyslipidemia. Most recent lipids from June of this year showed a      cholesterol of 125 with triglycerides 90, HDL 40  and LDL 67. He      should remain on Vytorin 10/20 with repeat lipids and LFTs next      June.  3. Infrarenal abdominal aortic aneurysm.  Dimensions slightly      increased on last ultrasound. Abdominal ultrasound is scheduled for      December. If stable will return to yearly serial studies.   For a followup, I would like to see Alfred [name] back in 6 months.     Veverly Fells. Excell Seltzer, MD  Electronically Signed    MDC/MedQ  DD: 03/22/2007  DT: 03/22/2007  Job #: [id number]   cc:   Titus Dubin. [name], MD,FACP,FCCP

## 2010-10-13 NOTE — Assessment & Plan Note (Signed)
[facility]                            CARDIOLOGY OFFICE NOTE   KURON, DOCKEN                         MRN:          [id number]  DATE:11/08/2006                            DOB:          [birth year]    CARDIOLOGY FOLLOWUP NOTE   Mr. Franca is a very pleasant 60 year old white male from [place], [place].  He owns a nursery.  He has coronary artery disease,  hyperlipidemia, and hypertension, moderate obesity.   He is 3 years post stenting of the circumflex and RCA with CYPHER  stents.  He had a total of 3 stents.  He also had angioplasty of a 90%  stenosis of a posterior descending coronary artery.   The patient got along quite well.  Most recent angiogram by Dr.  Gala Romney Oct 27, 2004 revealed patent stents, normal LV, and EF of 65%.  The patient has had no recurrent symptoms.  Most recent stress Myoview  January, 2008, revealed no ischemia, an EF of 63%.   MEDICATIONS:  1. Diovan/hydrochlorothiazide 180/12.5 daily.  2. Vytorin 10/20.  3. Plavix 75.  4. Aspirin 81.  5. Omega 3.  6. Metoprolol 50.   Blood pressure 137/88, pulse 58.  Normal sinus rhythm.  GENERAL APPEARANCE:  Normal.  JVPs not elevated.  Carotid pulses palpable and equal without bruits.  LUNGS:  Clear.  CARDIAC:  S4.  No murmur.  ABDOMEN:  Normal with no bruits.  EXTREMITIES:  No edema.   IMPRESSION:  1. Diagnosis as above.  The patient quite stable 3 years post CYPHER      stenting of the circumflex and right coronary artery using a total      of 3 CYPHER stents.  At that time, he also had angioplasty of a 90%      lesion of the posterior descending.  2. Hypertension, fair control.  3. Hyperlipidemia, well controlled.  4. Abdominal aortic aneurysm with increased size to 4.1x4.1 today as      compared to 3.8x3.8 a year ago.   An EKG revealed sinus bradycardia.  Otherwise, normal.   I have suggested the patient have a lipid, LFT, and BMP today.  He is to  decrease his sodium intake, lose weight, and return to see Dr. Excell Seltzer in  4 months or p.r.n.     E. Graceann Congress, MD, [facility]  Electronically Signed    EJL/MedQ  DD: 11/08/2006  DT: 11/08/2006  Job #: [id number]

## 2010-10-16 NOTE — Letter (Signed)
May 26, 2006    E. Graceann Congress, MD, [facility]  [address]  [address]  [place], [place]  [address]   RE:  Alfred [name], Alfred [name]  MRN:  [id number]  /  DOB:  [birth year]   Dear Alfred [name] had a complete physical examination May 26, 2006. Other than  dietary noncompliance, there were no significant historical items. He is  going through a trial separation from his wife and has been to  counseling. Since that time he states I have eaten with reckless  abandon. 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 His alcohol intake has increased to  3 drinks per day.   The only positive review of systems was some urethral burning present  for 3-4 weeks. On examination, there was initiation of these symptoms  with examination of the prostate. The prostate was enlarged. He was  prescribed Septra DS for up to 1 month to eliminate prostatitis.   His blood pressure was 132/94 and the Toprol was increased to 1 a day  and sodium restriction recommended.   There is no change in his low-grade systolic murmur. He did have  evidence of degenerative joint disease particularly in the knees.   Normal and negative studies include a CBC and differential, lipid  profile, A1c, TSH and PSA. His CMET was normal except for mild elevation  of SGOT and SGPT at 41 and 63 respectively. I  have recommended that he decrease his alcohol intake and avoid excessive  Tylenol or vitamin A.   I have encouraged him to have a screening colonoscopy.Do you need to see  him for clearance prior to that? At this time, he has no active  cardiopulmonary symptoms and EKG is normal.    Sincerely,      Titus Dubin. [name], MD,FACP,FCCP  Electronically Signed    WFH/MedQ  DD: 05/27/2006  DT: 05/27/2006  Job #: [id number]

## 2010-10-16 NOTE — Discharge Summary (Signed)
NAME:  Alfred [name], Alfred [name] NO.:  [id number]   MEDICAL RECORD NO.:  [id number]          PATIENT TYPE:  INP   LOCATION:  2027                         FACILITY:  [facility]   PHYSICIAN:  Cecil Cranker, M.D.DATE OF BIRTH:  [birth year]   DATE OF ADMISSION:  10/27/2004  DATE OF DISCHARGE:  10/28/2004                                 DISCHARGE SUMMARY   PRINCIPAL DIAGNOSIS:  Chest pain/coronary artery disease.   OTHER DIAGNOSES:  1. Hyperlipidemia.  2. Obesity.  3. History of abdominal aortic aneurysm.     ALLERGIES:  NO KNOWN DRUG ALLERGIES.   PROCEDURE:  Left heart cardiac catheterization.   HISTORY OF PRESENT ILLNESS:  A 60 year old white male with a prior history  of coronary artery disease status post PCI and stenting of the circumflex  and right coronary artery in May of 2005 with Cipher drug-alluding stents  who over the past two to three weeks, has noticed increased exertional chest  discomfort especially during periods of hot weather.  He saw DrCorinda Gubler in  clinic on Oct 27, 2004 and subsequently was admitted to the hospital for  cardiac catheterization.   HOSPITAL COURSE:  Catheterization on Oct 27, 2004 revealed a normal left  main, a 30-40% lesion of the proximal and mid left anterior descending, a  30% lesion in the mid circumflex with a widely patent stent in the distal  circumflex into the OM2.  The right coronary artery had 30-40% stenosis in  the previously placed proximal stent, 20-30% stenosis in the mid section of  the artery and a patent stent in the distal right coronary artery.  Ejection  fraction was measured at 65% and a small stable-appearing abdominal aortic  aneurysm was noted while panning down on the LV gram.  The patient tolerated  this procedure well and this morning, has been ambulating without difficulty  or recurrent symptoms.  He is being discharged home today in satisfactory  condition.   DISCHARGE LABORATORIES:  Hematocrit 42.7,  WBC 5.2, platelets 223,000.  Sodium 140, potassium 3.9, chloride 107, CO2 26, BUN 10, creatinine 0.8,  glucose 110.  PT 12.8, INR 1.0, PTT 31.  Total bilirubin 1.0, alkaline  phosphatase 86, AST 32, ALT 44.  Cardiac enzymes are negative x2.  Calcium  is 8.7.   DISPOSITION:  The patient is being discharged to home in good condition.   FOLLOW UP:  Follow up with Dr. [name] and has an appointment on November 13, 2004.  He also has an appointment with Dr. Graceann Congress on November 17, 2004  at 9:15 a.m.   DISCHARGE MEDICATIONS:  1. Toprol XL 25 mg daily.  2. Lipitor 20 mg daily.  3. Aspirin 81 mg daily.  4. Plavix 75 mg daily.  5. Nitroglycerin 0.4 mg sublingual p.r.n. chest pain.     OUTSTANDING LABORATORY STUDIES:  None.   DURATION OF DISCHARGE ENCOUNTER:  45 minutes.      CRB/MEDQ  D:  10/28/2004  T:  10/28/2004  Job:  [id number]

## 2010-10-16 NOTE — Cardiovascular Report (Signed)
NAME:  Alfred [name], Alfred [name] NO.:  [id number]   MEDICAL RECORD NO.:  [id number]                   PATIENT TYPE:  OIB   LOCATION:  2856                                 FACILITY:  [facility]   PHYSICIAN:  Arturo Morton. Riley Kill, M.D. [facility]         DATE OF BIRTH:  1951-05-05   DATE OF PROCEDURE:  10/29/2003  DATE OF DISCHARGE:                              CARDIAC CATHETERIZATION   INDICATIONS:  Mr. Penton is a 60 year old gentleman who is well-known to [place].  He underwent percutaneous intervention of the circumflex coronary artery  last week.  He had had a previous catheterization five years ago with  basically subtotal occlusion of the right coronary artery with left-to-right  collaterals and was treated medically.  The circumflex was stented last week  and the right was noted to have been recanalized with a chronically  dissected-appearing proximal right coronary artery and a fairly high-grade  lesion in the posterior descending branch.  We had recommended an attempt to  reopen this as his LV function was well-preserved in the RCA territory.  The  patient was well-informed about risks, benefits, and alternatives.  He has  tolerated aspirin and Plavix well.  He has had no complications.  The  patient requested that he have femoral closure if at all possible.   PROCEDURES:  1. Percutaneous stenting of the proximal right coronary artery.  2. Percutaneous stenting of the posterior descending artery.  3. Femoral closure of the left femoral artery.  4. Selective coronary arteriography of the left coronary artery.   DESCRIPTION OF PROCEDURE:  The patient was brought to the catheterization  lab and prepped and draped in the usual fashion.  Through an anterior  puncture the left femoral artery was easily entered.  A 7 French sheath was  placed.  Bivalirudin was given according to protocol.  ACT was checked and  found to be appropriate.  A standard left 4 Judkins catheter was then  utilized to intubate the left coronary artery.  Views of the circumflex were  then laid out.  This demonstrated continued patency at the stent site with  continued patency of the side branch that had been previously dilated  through the stent.  With this we made the decision to proceed on to the  right coronary.  The right coronary was engaged using a JR4 7 [place] guide  with side holes.  We employed a high-torque floppy 0.014 wire for the  initial attempt at crossing this chronically dissected area.  With some wire  manipulation we were ultimately able to get across the lesion and into the  midvessel.  We did not cross the distal lesion because the wire was at this  point fairly tight within the vessel itself.  Following this, pre-dilatation  was done with a 2 mm balloon and then the wire was entirely free.  The wire  was passed across the distal stenosis.  A 2.5  mm balloon was then used to  pre-dilate the proximal vessel.  The distal vessel was then pre-dilated  using a 9 x 2.25 mm Maverick balloon.  This opened the lesion, but there was  a small area of dissection.  The PDA was then promptly stented using a 2.5 x  18 Cypher stent.  The stent had been carefully measured previously.  This  was taken up to about 12-13 atmospheres.  There was marked improvement in  the appearance of the artery with good runoff into the distal vessel.  The  proximal vessel was then stented after careful placement of the stent.  A 28  x 3.0 Cypher stent was placed across the proximal area of chronic  dissection.  This was taken up to about 15 atmospheres.  We elected to go  ahead and post-dilated this area, and a 3.5 mm PowerSail was used to post-  dilate the entire stent.  We then went down and post-dilated the stent in  the posterior descending artery using a 2.5 mm balloon throughout the course  of the stent.  This was then pulled back and the 3.5 reinserted to the  distal edge of the stent, which opened up  into a fairly larger portion of  the artery.  This was then post-dilated up to about 14 atmospheres, yielding  a size of about 3.66.  This was then removed.  Marked improvement was noted  in the appearance of the artery.  The wire was removed and nitroglycerin  given.  Final angiographic views were obtained.  Following this all  catheters were subsequently removed.  The femoral sheath was then removed  over a wire after sterile prep, drape, and changing of gloves.  After an  appropriate period of time, the Angioseal device was then deployed in the  left femoral artery.  The patient became slightly vagal, but this resolved  without treatment.  He was taken to the holding area in satisfactory  clinical condition.   ANGIOGRAPHIC DATA:  1. The left main is widely patent.  This leads into the circumflex.  2. The circumflex has been stented across an AV portion distally.  The     stented area is widely patent.  The vessel does taper distal to this but     is probably chronically undersized.  The AV portion of the vessel also     continues to appear widely patent after this was opened through the sides     of the stent.  3. The right coronary artery is a large, dominant vessel providing a     moderate-sized posterior descending branch.  The PDA looked bigger after     intracoronary nitroglycerin.  The proximal area, which measures around 18-     20 mm in length with an area of what appears to be chronic dissection at     the site of prior subtotal occlusion, was stented to 0 residual from 80%.     This appeared to be smooth and with an excellent angiographic appearance     and no evidence of edge tear or dissection.  The vessel opened up.  There     was about a 30% area of irregularity in the distal vessel leading into     the PDA.  The PDA previously had a 90% stenosis, which was reduced to 0%,     with excellent TIMI-3 runoff into the distal vessel.   CONCLUSION: 1. Successful percutaneous  stenting of the proximal right coronary  artery at     the site of previous subtotal occlusion and chronic dissection.  2. Successful percutaneous stenting of the posterior descending branch.  3. Continued patency of the circumflex coronary artery at the site of     previous stenting.  4. Successful femoral closure.   DISPOSITION:  1. The patient will follow up with Dr. Corinda Gubler in approximately two weeks.  2. The patient will be treated with aspirin and clopidogrel, and I would     recommended treatment for approximately one year if the patient tolerates     this.  3. Continued work at risk factor reduction will be recommended.                                               Arturo Morton. Riley Kill, M.D. [facility]    TDS/MEDQ  D:  10/29/2003  T:  10/29/2003  Job:  [id number]   cc:   Titus Dubin. [name], M.D. [facility]   Bea Laura Graceann Congress, M.D.   Cardiovascular Laboratory

## 2010-10-16 NOTE — Assessment & Plan Note (Signed)
[facility]                         GASTROENTEROLOGY OFFICE NOTE   Alfred [name], Alfred [name]                         MRN:          [id number]  DATE:07/05/2006                            DOB:          04/13/1951    REFERRING PHYSICIAN:  Titus Dubin. [name], MD,FACP,FCCP   REASON FOR ADMISSION:  Family history of colon polyps.   [name] is a very nice, 60 year old white male referred through the  courtesy of Dr. Marga [name].  He relates that his father had colon  polyps diagnosed around age 81.  The patient has no gastrointestinal  complaints and, specifically, denies any change in bowel habits, change  in stool caliber, constipation, diarrhea, melena, hematochezia,  abdominal pain, or rectal pain.  No other family members with colon  polyps or colon cancer.  He has not previously had colonoscopy.   PAST MEDICAL HISTORY:  Coronary artery disease status post drug-eluting  stent placement in May 2005.  Repeat catheterization in May 2006 showed  patency of the stents.  A small infrarenal abdominal aortic aneurysm,  hyperlipidemia, hypertension, status post appendectomy, status post T  and A.   SOCIAL HISTORY:  Per the handwritten forms.   REVIEW OF SYSTEMS:  Per the handwritten forms.   PHYSICAL EXAM:  Overweight white male in no acute distress.  Height 6 feet.  Weight 266 pounds.  Blood pressure is 104/62.  Pulse 72  and regular.  HEENT:  Anicteric sclerae.  Oropharynx clear.  CHEST:  Clear to auscultation bilaterally.  CARDIAC:  Regular rate and rhythm without murmurs appreciated.  ABDOMEN:  Soft, nontender, nondistended.  Normoactive bowel sounds.  No  palpable organomegaly, masses, or hernias.  RECTAL:  Deferred at the time of colonoscopy.  Rectal examination by Dr.  [name] on May 26, 2006, showed no lesions and hemoccult-negative  stool.  EXTREMITIES:  Without clubbing, cyanosis, edema.  NEUROLOGIC:  Alert and oriented x3.  Grossly  nonfocal.   ASSESSMENT AND PLAN:  First-degree relative with colon polyps.  The  patient on Plavix and aspirin status post drug-eluting stent placement  in May 2005.  Risks, benefits, and alternatives to colonoscopy with  possible biopsy and possible polypectomy discussed with the patient.  He  consents to proceed.  This will be scheduled electively.  We will obtain  clearance from Dr. Glennon Hamilton to hold Plavix for 7 days prior to the procedure if possible.  He will remain on enteric-coated aspirin 81 mg daily throughout the time  of the procedure.     Venita Lick. Russella Dar, MD, [facility]  Electronically Signed    MTS/MedQ  DD: 07/05/2006  DT: 07/05/2006  Job #: [id number]   cc:   Titus Dubin. [name], MD,FACP,FCCP  Cecil Cranker, MD, [facility]

## 2010-10-16 NOTE — H&P (Signed)
NAME:  Alfred [name], Alfred [name]                ACCOUNT NO.:  [id number]   MEDICAL RECORD NO.:  [id number]          PATIENT TYPE:  OIB   LOCATION:  6531                         FACILITY:  [facility]   PHYSICIAN:  Arturo Morton. Riley Kill, M.D. [facility] OF BIRTH:  February 02, 1951   DATE OF ADMISSION:  10/29/2003  DATE OF DISCHARGE:  10/30/2003                                HISTORY & PHYSICAL   CHIEF COMPLAINT:  Here for second angioplasty.   HISTORY OF PRESENT ILLNESS:  The patient is a 60 year old gentleman who  returns for an attempt at a PCI of the right coronary artery.  He had the  circumflex previously dilated last week.  He is feeling better.  Films were  reviewed in detail.  He understood the risks, benefits, and alternatives,  and he is brought in today for a repeat cardiac catheterization.  The  patient was initially seen here 5 years ago.  At that time, he was having  chest pain.  He had an abnormal Cardiolite.  He underwent coronary  angiography, which revealed a 99% proximal right, 20% proximal LAD, and a  distal right filter collaterals from the left anterior descending.  He has  been stable with hyperlipidemia and obesity until approximately 2 weeks ago.  He has noted very typical chest discomfort since that time.   PAST HISTORY:  1.  Appendectomy.  2.  Tonsillectomy and adenoidectomy.   ALLERGIES:  None.   CURRENT MEDICATIONS:  1.  Enteric-coated aspirin 325 mg daily.  2.  Plavix 75 mg daily.  3.  Lipitor 20 mg q.h.s.  4.  Toprol-XL 25 mg daily.  5.  Multivitamin daily.  6.  Sublingual nitroglycerin p.r.n.   SOCIAL HISTORY:  The patient is a former smoker.  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   FAMILY HISTORY:  Father died of a myocardial infarction at 75.  Mother died  of an myocardial infarction in her 68s.   PHYSICAL EXAMINATION:  GENERAL:  An alert, oriented gentleman in no  distress.  VITAL SIGNS:  Temperature 96.9, pulse 66, respiratory rate 20,  blood  pressure 126/74, O2 saturation of 94%.  Height 6 feet, weight 269.  NECK:  Jugular venous pressure not elevated.  Carotid pulses are equal.  LUNGS:  Clear.  CARDIAC:  PMI is not displaced.  There is a 1/6 systolic ejection murmur.  No gallop.  ABDOMEN:  Unremarkable.  NEUROLOGIC:  Unremarkable.   IMPRESSION:  1.  Recanalized right coronary artery for percutaneous intervention status      post percutaneous intervention of the circumflex.  2.  Other problems as noted above.   PLAN:  Risks, benefits, and alternatives have been discussed with the  patient in detail, and he is agreeable to proceed.   ADDENDUM:  The patient does have an identified abdominal aortic aneurysm.       TDS/MEDQ  D:  02/27/2004  T:  02/27/2004  Job:  [id number]

## 2010-10-16 NOTE — Cardiovascular Report (Signed)
NAME:  Alfred [name], BRANCATO NO.:  [id number]   MEDICAL RECORD NO.:  [id number]          PATIENT TYPE:  INP   LOCATION:  2027                         FACILITY:  [facility]   PHYSICIAN:  Arvilla Meres, M.D. [facility] OF BIRTH:  April 12, 1951   DATE OF PROCEDURE:  10/27/2004  DATE OF DISCHARGE:                              CARDIAC CATHETERIZATION   PRIMARY CARE PHYSICIAN:  Titus Dubin. [name], M.D. [facility].   CARDIOLOGIST:  Cecil Cranker, M.D.   PROCEDURES PERFORMED:  1.  Selective coronary angiography.  2.  Left heart catheterization.  3.  Left ventriculogram.   DESCRIPTION OF PROCEDURE:  The risks and benefits of the catheterization  were explained to Mr. Bowden.  Consent was signed and placed on the chart.  Right groin area was prepped and draped in routine sterile fashion and then  anesthetized with 1% local lidocaine.  A 6 French arterial sheath was placed  in the right femoral artery using a modified Seldinger technique.  Standard  catheters including a JL-4, JR-4, and angled pigtail were used for the  procedure.  All catheter exchanges were made over a wire.  There were no  apparent complications.  At the end of the procedure, the patient was taken  to the holding area for removal of his vascular access.   FINDINGS:  Central aortic pressure was 136/85, with a mean of 106.  LV  pressure was 149/12/17.   CORONARY ANATOMY:  Left main was normal.   LAD was a large vessel wrapping the apex.  It gave off a very large first  diagonal and two small distal diagonals.  There was a 30-40% tubular lesion  in the proximal LAD prior to the takeoff of the first diagonal.  There was a  30% lesion in the mid-LAD after the first diagonal.  Otherwise, there was no  significant CAD.   The left circumflex was a large vessel.  It gave off a large ramus as well  as a large OM1, a small to moderate-size OM2, and a small OM3.  There was a  previous stent extending from the mid-left  circumflex in the AV groove into  the large first marginal.  There was a 30% lesion in the AV groove  circumflex prior to the beginning of the stent.  The stent was widely  patent.  There was no significant CAD vessel otherwise.   Right coronary artery was a large dominant vessel.  It gave off a large PDA  and a small PL.  There was a long area of stenting in the proximal right  coronary as well as in the proximal portion of the PDA.  There was a 20%  ostial lesion inside of the proximal stent.  There was also a 30-40% focal  stenosis in the midportion of the stent.  There was a 20-30% tubular lesion  in the mid-RCA as well as minor irregularities throughout the distal RCA  prior to the takeoff of the PDA.  The stent in the PDA was widely patent.   Left ventriculogram done in the RAO approach showed an EF of  65% with no  mitral regurgitation.  On panning down over the abdominal aorta after the  ventriculogram, there was again the appearance of a small abdominal aneurysm  which appeared stable.   ASSESSMENT:  1.  Nonobstructive coronary artery disease as above, with patent stents.  2.  Normal left ventricular function.  3.  Small infrarenal abdominal aortic aneurysm which appears stable.   PLAN:  To continue medical management and risk factor modification.  Consider a proton pump inhibitor for his recent chest pain.  If this is  unsuccessful, he may warrant GI followup.  Presuming his groin remains  stable, he should be suitable for discharge home in the morning after his  bed rest.      DB/MEDQ  D:  10/27/2004  T:  10/28/2004  Job:  [id number]   cc:   Titus Dubin. [name], M.D. [facility]   Bea Laura Graceann Congress, M.D.

## 2010-10-16 NOTE — Discharge Summary (Signed)
NAME:  Alfred [name], Alfred [name]                            ACCOUNT NO.:  [id number]   MEDICAL RECORD NO.:  [id number]                   PATIENT TYPE:  OIB   LOCATION:  6531                                 FACILITY:  [facility]   PHYSICIAN:  Arturo Morton. Riley Kill, M.D. [facility]         DATE OF BIRTH:  Sep 30, 1950   DATE OF ADMISSION:  10/29/2003  DATE OF DISCHARGE:  10/30/2003                           DISCHARGE SUMMARY - REFERRING   DISCHARGE DIAGNOSES:  1. Coronary artery disease status post stent placement to the circumflex as     well as the right coronary artery.  2. Dyslipidemia, treated.  3. Infrarenal abdominal aortic aneurysm.  4. Hypertension, treated.  5. Remote tobacco use.  6. Family history of coronary artery disease.   HOSPITAL COURSE:  Mr. Noxon is a 60 year old male patient who was admitted  on Oct 23, 2003, through Oct 25, 2003, for unstable angina.  During that  hospitalization, he received a Sypher stent to the left circumflex at the  bifurcation of the obtuse marginal.  He was also shown at that time to have  an 80% chronic dissection of the proximal right coronary artery.  He was  discharged to home with plans for elective intervention on Oct 29, 2003.  On  Oct 29, 2003, he was admitted for this procedure and underwent a Sypher  stent placement to the proximal right coronary artery lesion reducing the 80-  90% lesion to a 0% postprocedure.  There was a proximal 30% right lesion and  there was a PDA lesion, proximal, that was 90%.  A Sypher stent was placed  at that lesion reducing to a 0% postprocedure.   The patient tolerated the procedure well and the following morning, his  vitals were stable, blood pressure 130/80, pulse 72, heart regular with no  murmur, abdomen soft, and extremities were with good pulses.  The BUN was 9,  creatinine 0.9, potassium  4.0, hemoglobin 13.8, hematocrit 38.7, and  platelets were 218.   DISPOSITION/DISCHARGE MEDICATIONS:  The patient will be discharged  to home  today in stable condition.  He will continue his home medications which  include:  1. Enteric coated aspirin 325 mg.  2. Plavix 75 mg daily.  3. Lipitor 20 mg q.h.s.  4. Toprol XL 25 mg daily.  5. Multivitamin daily.  6. Sublingual nitroglycerin p.r.n. chest pain.  7. Tylenol 1-2 tablets q.4-6h. as needed for pain.   He is not to strain or lift over 10 pounds for 1 week, no tub soaking for 1  week, remain on a low-fat diet, clean over catheterization site with soap  and water, no scrubbing.  He is to call for any questions or concerns.  He  does have a followup appointment with Dr. Glennon Hamilton on November 14, 2003, at  10:45 a.m.      Guy Franco, P.A. [facility]  Arturo Morton. Riley Kill, M.D. [facility]    LB/MEDQ  D:  10/30/2003  T:  10/30/2003  Job:  [id number]   cc:   Cecil Cranker, M.D.

## 2010-10-16 NOTE — Assessment & Plan Note (Signed)
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  He is nearly 3 years post Cypher stenting  of the circumflex and RCA.  He had a total of 3 Cypher stents.  He also  had angioplasty for 90% posterior descending coronary artery.   The patient has gotten along quite well.  Most recent angiogram by Dr.  Gala Romney Oct 27, 2004, revealed patent stents.  There was normal LV  with an EF of 65%.  The patient has had no recurrent symptoms.  He is  scheduled for screening colonoscopy, and is referred here for pre-op  evaluation and question concerning Plavix.   He had a stress Myoview which revealed no ischemia and an EF of 63%.   MEDICATIONS:  1. Diovan HCT 180/12.5.  2. Vytorin 10/20.  3. Plavix 75.  4. Toprol XL 50.  5. Aspirin 81.  6. Metoprolol 50.   PHYSICAL EXAMINATION:  Blood pressure 120/81, pulse 68, normal sinus  rhythm.  GENERAL APPEARANCE:  Normal.  JVP is not elevated.  Carotid pulses are palpable and equal without  bruits.  LUNGS:  Clear.  CARDIAC:  Exam normal.  ABDOMEN:  Exam normal.  EXTREMITIES:  Normal.   IMPRESSION:  Diagnoses as above.  We plan to discontinue the Plavix 7  days prior to the colonoscopy, then he can restart following that.  Apparently he was told by Dr. Ardell Isaacs office that he could continue on  the aspirin.  I suggest the patient be seen in 5 to 6 months and he will  see Dr. Riley Kill, who did his prior stenting.     Cecil Cranker, MD, [facility]  Electronically Signed    EJL/MedQ  DD: 07/08/2006  DT: 07/09/2006  Job #: [id number]

## 2010-10-16 NOTE — Cardiovascular Report (Signed)
NAME:  Alfred [name], Alfred [name]                            ACCOUNT NO.:  [id number]   MEDICAL RECORD NO.:  [id number]                   PATIENT TYPE:  INP   LOCATION:  6532                                 FACILITY:  [facility]   PHYSICIAN:  Arturo Morton. Riley Kill, M.D. [facility]         DATE OF BIRTH:  1950-06-08   DATE OF PROCEDURE:  10/24/2003  DATE OF DISCHARGE:  10/25/2003                              CARDIAC CATHETERIZATION   INDICATIONS:  Mr. Mandler is a 60 year old gentleman who presents with some  exertional angina.  He underwent cardiac catheterization in 2000 at which  time he had a subtotal occlusion in the right coronary artery with left-to-  right collaterals.  He has had recurrent chest discomfort.  He also has a  known abdominal aortic aneurysm and distal aortography was recommended.   PROCEDURES:  1. Left heart catheterization.  2. Selective coronary arteriography.  3. Selective left ventriculography.  4. Distal aortography.  5. Percutaneous coronary intervention of the circumflex coronary artery.   DESCRIPTION OF PROCEDURE:  The patient was brought to the catheterization  lab and prepped and draped in the usual fashion.  Through an anterior  puncture, the right femoral artery was easily entered.  A 6 French sheath  was placed.  Views of the left and right coronary arteries were obtained in  multiple angiographic projections.  Central aortic and left ventricular  pressures were measured with pigtail.  Ventriculography was performed in the  RAO projection.  Distal aortography was also performed to assess abdominal  aortic aneurysm.  The patient was noted to have a chronically dissected  right coronary artery with recannulization and a distal stenosis.  He was  also noted to have a distal high grade circumflex stenosis.  Given the  contrast load, it was felt that the best approach would be to approach the  circumflex today and to stage the right coronary procedure as it could  potentially be a  long procedure.  The risks and benefits were discussed with  the patient and subsequently with his parents.  The patient was agreeable to  proceed.  Heparin and integrilin were given according to protocol.  ACT was  checked and found to be appropriate.  The JL-4 guiding catheter was  utilized.  Hi-Torque Floppy was placed down to the circumflex artery across  the origin of the distal portion of the circumflex.  We then predilated  using a 2.0 x 12 balloon.  Following predilatation, the vessel was then  stented with a 2.5 x 18 Cypher stent.  There was marked improvement in  appearance of the native vessel.  However, there was some pinching of the AV  circumflex beyond this which had about 40% narrowing and then 70% after this  was done.  We then post dilated using a 3 mm x 8 mm length Voyage PowerSail.  Multiple inflations were done throughout the course of the stent. The wire  was then  redirected through the side of the stent into the AV portion of the  circumflex and a couple of low pressure inflations done with a 2.25 mm  balloon.  This resulted in improvement in the appearance of the side branch  from 40 to subsequently 70 and then back down to 40% narrowing.  The patient  tolerated the procedure well.  All catheters were subsequently removed and  the femoral sheath sewn into place.  He was taken to the holding area in  satisfactory clinical condition.   HEMODYNAMIC DATA:  1. Central aorta:  157/101, mean 124.  2. Left ventricle:  141/15.  3. No gradient on pullback across the aortic valve.   ANGIOGRAPHIC DATA:  1. Ventriculography was performed in the RAO projection.  Overall systolic     function appears to be well preserved.  There is not a definite wall     motion abnormality.  2. Distal aortography reveals an infrarenal abdominal aortic aneurysm.  This     extends down to the aortic bifurcation.  Overall, the vessels appear to     be relatively smooth.  The aneurysm itself is  calcified and at least     moderate in size.  This has been previously documented by ultrasound as     well.  3. The left main coronary artery is free of critical disease.  4. The LAD courses to the apex and wraps the apex providing a significant     inferior component.  There are two diagonal branches.  There is 30-40%     area of segmental narrowing in the mid portion of the LAD does not appear     to be high grade nor flow-limiting.  5. There is a ramus intermedius that is free of critical disease.  6. The AV circumflex has about a 90% stenosis leading into a very large     marginal branch.  The AV circumflex has a slightly smaller marginal     branch coming off of this area and the stenosis goes up to about the     bifurcation point.  Following stenting, this area was reduced from 90 to     0%.  The continuation portion of the AV circumflex was 40% prior to the     stent placement, then 70% after the stent placement, but then 40% after     redilatation through the side of the stent.  7. The right coronary artery is a large caliber vessel providing     predominantly posterior descending branch.  Up in the proximal vessel is     a chronically appearing dissected vessel with about 80% narrowing, but     with good TIMI-3 antegrade flow and no evidence of retrograde collaterals     as noted on the previous study.  The proximal portion of the PDA has     about a 75% area of narrowing. Both of these appear to be approachable by     percutaneous intervention.   CONCLUSION:  1. Preserved left ventricular function.  2. Recannulization of the right coronary artery with chronically dissected     vessel and tandem lesions involving the posterior descending artery.  3. New high grade stenosis in the distal circumflex with successful     percutaneous bifurcation intervention as noted above.   DISPOSITION:  The patient will be treated with aspirin and Plavix. Lifestyle modification should be  recommended.  Finally, the patient will be  brought back for percutaneous intervention in  the right coronary artery if  he is agreeable.                                               Arturo Morton. Riley Kill, M.D. [facility]    TDS/MEDQ  D:  10/24/2003  T:  10/25/2003  Job:  [id number]

## 2010-10-16 NOTE — Discharge Summary (Signed)
NAME:  Alfred, THRALL                            ACCOUNT NO.:  [id number]   MEDICAL RECORD NO.:  [id number]                   PATIENT TYPE:  INP   LOCATION:  6532                                 FACILITY:  [facility]   PHYSICIAN:  Cecil Cranker, M.D.             DATE OF BIRTH:  10-04-1950   DATE OF ADMISSION:  10/23/2003  DATE OF DISCHARGE:  10/25/2003                                 DISCHARGE SUMMARY   DISCHARGE DIAGNOSES:  1. Admitted Oct 23, 2003 with unstable angina.  2. History of coronary artery disease with left heart catheterization, Oct 23, 2003, left ventricular function preserved, a CYPHER stent placed at     the circumflex at the bifurcation of the first obtuse marginal.  There is     residual disease:  Chronic proximal right coronary artery dissection.   SECONDARY DISCHARGE DIAGNOSES:  1. Known coronary artery disease.  2. Dyslipidemia.  3. Strong family history of coronary artery disease.  4. Infrarenal abdominal aortic aneurysm.  5. Remote tobacco.  6. Hypertension.   PROCEDURE:  Oct 24, 2003, left heart catheterization with finding of 90%  stenosis at the left circumflex at the bifurcation of the obtuse marginal;  this 90% stenosis was reduced with 0% with a CYPHER stent.  He was also  shown to have an 80% chronic dissection in the proximal right coronary  artery which will have percutaneous coronary intervention, Oct 29, 2003,  next week.  He had a 75% distal stenosis in the posterior descending artery  and a 30% stenosis in the left anterior descending, just after the first  diagonal.  Once again, left ventricular function preserved.   DISCHARGE DISPOSITION:  Alfred [name] is going home post-procedure day #1  after undergoing left heart catheterization and stent placement in the left  circumflex.  He has had no chest pain since the procedure, his vital signs  are stable, he has maintained sinus rhythm, this hospitalization, he is  achieving 94% oxygen  saturation on room air.  His discharge laboratories are  within normal limits.  He also underwent fasting lipid profile and TSH  study, this hospitalization.   DISCHARGE MEDICATIONS:  He goes home on the following medications:  1. Enteric-coated aspirin 325 mg daily.  2. Plavix 75 mg daily.  3. Lipitor 20 mg daily at bedtime.  4. Toprol-XL 25 mg daily.  5. Multivitamin daily.  6. Nitroglycerin 0.4 mg 1 tablet under the tongue every 5 minutes x3 doses     as needed for chest pain.  7. Tylenol 325 mg 1-2 tabs every 4-6 hours as needed for pain at the     catheterization site.   ACTIVITY:  He is to avoid straining of heavy lifting for the next 2 weeks.  He may drive beginning Sunday, Oct 27, 2003.  He is able to shower.   DISCHARGE DIET:  Low-sodium, low-cholesterol diet.  SPECIAL DISCHARGE INSTRUCTIONS:  He is to call [facility] at [phone] if he experiences pain or increased swelling at the catheterization  site.  He is to have nothing to eat after midnight, Monday, Oct 28, 2003.  He reports to Short-Stay C at [facility], Tuesday morning, Oct 29, 2003, at 7:30 for a 9:30 case.   BRIEF HISTORY:  Alfred [name] is a 60 year old male.  [place]  He has known coronary artery disease.  He presents with unstable  angina.  He was initially seen 5 years ago.  He had chest pain with exertion  and an abnormal Cardiolite study.  He underwent coronary angiography which  showed a 99% proximal right, 20% proximal left anterior descending and  irregular circumflex.  The distal right filled with collaterals from the  left anterior descending and circumflex.  The LAD was normal.  He was noted  to have a probable infrarenal aortic aneurysm, which was subsequently  documented by abdominal ultrasound, and has been followed on a regular  basis.  The patient has been stable with his hyperlipidemia and obesity,  until approximately 2 weeks ago; since that time,  he has had very typical  chest discomfort radiating to both shoulders with exertion, particularly  walking up slight incline; this resolves with rest or when he takes  nitroglycerin.  The patient has a strong family history of coronary artery  disease.  He has no shortness of breath, diaphoresis, palpitations or  dizziness.  The patient claims that his most severe pain to date occurred  Oct 22, 2003, the day before this admission.  He decided to seek attention  because he was concerned about progression of angina to acute myocardial  infarction.   HOSPITAL COURSE:  Admitted to [facility], Oct 23, 2003, started on  IV heparin, scheduled for a left heart catheterization, which was done the  following day, Oct 24, 2003, with the study as dictated above.  Stent was  placed in the left circumflex and he returned to having his residual disease  in the right coronary artery attended to on Tuesday, Oct 29, 2003.  He goes  home, post-procedure day #1, with the medications and followup as dictated.   DISCHARGE PROCESS:  This has taken greater than 30 minutes, rounding and  dictation.   LABORATORY STUDIES AT DISCHARGE:  The serum electrolytes:  Sodium 141,  potassium 4.2, chloride 107, bicarbonate 31, BUN 7, creatinine 1, glucose  102.  Cardiac enzymes, post procedure, Oct 24, 2003 at 2105 hours:  CK is  79, CK-MB is 1.5.  TSH, this admission, 2.8.  Lipid study:  Cholesterol 115,  triglycerides 67, HDL cholesterol 40, LDL 62.  Complete blood count on Oct 25, 2003:  White cells 4.9, hemoglobin 13.8, hematocrit 39.6, platelets  242,000.      Maple Mirza, Anders Simmonds, M.D.    GM/MEDQ  D:  10/25/2003  T:  10/25/2003  Job:  [id number]   cc:   Cecil Cranker, M.D.   Titus Dubin. [name], M.D. [facility]   [facility] Short-Stay C

## 2010-11-04 ENCOUNTER — Other Ambulatory Visit: Payer: Self-pay | Admitting: *Deleted

## 2010-11-04 MED ORDER — ROSUVASTATIN CALCIUM 10 MG PO TABS: ORAL_TABLET | ORAL | Status: DC

## 2010-11-25 ENCOUNTER — Encounter: Payer: Self-pay | Admitting: Cardiovascular Disease

## 2010-12-08 ENCOUNTER — Telehealth: Payer: Self-pay | Admitting: *Deleted

## 2010-12-08 ENCOUNTER — Telehealth: Payer: Self-pay | Admitting: Cardiovascular Disease

## 2010-12-08 MED ORDER — LOSARTAN POTASSIUM-HCTZ 100-25 MG PO TABS: 1.0000 | ORAL_TABLET | Freq: Every day | ORAL | Status: DC

## 2010-12-08 NOTE — Telephone Encounter (Signed)
Fine with me for him to switch to hyzaar 100/25mg 

## 2010-12-08 NOTE — Telephone Encounter (Signed)
I talked with pt. Pt currently taking Micardis/HCT 80/25. Pt states Hyzaar is on preferred drug list. Pt is asking if he can change Micardis/HCT to Hyzaar. Pt took last Micardis/HCT today.  Pt will go  ahead and get a 30 day supply of Micardis/HCT. I will forward to Dr Excell Seltzer for review.

## 2010-12-08 NOTE — Telephone Encounter (Signed)
I talked with pt and he is aware he can change to Hyzaar 100/25 from Micardis/HCT 80/25.

## 2010-12-08 NOTE — Telephone Encounter (Signed)
Per pt call, pt would like to know if he can substitute cozarr for the micardis. If so pt needs RX for cozarr called in to [facility] in [place]. Please call pt to inform which RX he must take. Pt would like to switch from micardis to cozarr due to price difference.

## 2010-12-08 NOTE — Telephone Encounter (Signed)
Katina Dung, RN 12/08/2010 3:52 PM Signed  I talked with pt and he is aware he can change to Hyzaar 100/25 from Micardis/HCT 80/25. Tonny Bollman, MD 12/08/2010 3:36 PM Signed  Fine with me for him to switch to hyzaar 100/25mg  Katina Dung, RN 12/08/2010 3:22 PM Signed  I talked with pt. Pt currently taking Micardis/HCT 80/25. Pt states Hyzaar is on preferred drug list. Pt is asking if he can change Micardis/HCT to Hyzaar. Pt took last Micardis/HCT today. Pt will go ahead and get a 30 day supply of Micardis/HCT. I will forward to Dr Excell Seltzer for review

## 2010-12-25 ENCOUNTER — Encounter: Payer: Self-pay | Admitting: Internal Medicine

## 2010-12-25 ENCOUNTER — Ambulatory Visit (INDEPENDENT_AMBULATORY_CARE_PROVIDER_SITE_OTHER): Payer: Commercial Indemnity | Admitting: Internal Medicine

## 2010-12-25 ENCOUNTER — Telehealth: Payer: Self-pay | Admitting: *Deleted

## 2010-12-25 DIAGNOSIS — I714 Abdominal aortic aneurysm, without rupture, unspecified: Secondary | ICD-10-CM

## 2010-12-25 DIAGNOSIS — E785 Hyperlipidemia, unspecified: Secondary | ICD-10-CM

## 2010-12-25 DIAGNOSIS — I10 Essential (primary) hypertension: Secondary | ICD-10-CM

## 2010-12-25 NOTE — Telephone Encounter (Signed)
Per Dr.Hopper he will check in 3 weeks- Dr.Cooper has been checking labs.

## 2010-12-25 NOTE — Telephone Encounter (Signed)
Pt called and was just making sure that he did not need to have labs done while he was here.

## 2010-12-25 NOTE — Patient Instructions (Addendum)
Preventive Health Care: Blood Pressure Goal  Ideally is an AVERAGE < 135/85. This AVERAGE should be calculated from @ least 5-7 BP readings taken @ different times of day on different days of week. You should not respond to isolated BP readings , but rather the AVERAGE for that week  The  smokeless tobacco is the greatest risk for oral cancer. It also increases acid production. Exercise at least 30-45 minutes a day,  3-4 days a week.  Eat a low-fat diet with lots of fruits and vegetables, up to 7-9 servings per day. Avoid obesity; your goal is waist measurement < 40 inches.Consume less than 40 grams of sugar per day from foods & drinks with High Fructose Corn Sugar as # 1,2,3 or # 4 on label.                         Alcohol If you drink, do it moderately,less than 9 drinks per week, preferably less than 6 @ most. Health Care Power of Attorney & Living Will. Complete if not in place ; these place you in charge of your health care decisions. Mild elevation of liver enzyme test; avoid excess Tylenol, alcohol & vitamin A.Recheck in 3-4 months with A1c ( 790.4, 790.29)

## 2010-12-25 NOTE — Progress Notes (Signed)
Subjective:    Patient ID: Alfred [name], male    DOB: 08-May-1951, 60 y.o.   MRN: [id number]  HPI  Mr Swor  is here for a physical; he has no acute issues.      Review of Systems Patient reports no  vision/ hearing changes,anorexia, weight change, fever ,adenopathy, persistant / recurrent hoarseness, swallowing issues, chest pain,palpitations, edema,persistant / recurrent cough, hemoptysis, dyspnea(rest, exertional, paroxysmal nocturnal), gastrointestinal  bleeding (melena, rectal bleeding), abdominal pain, excessive heart burn, GU symptoms( dysuria, hematuria, pyuria, voiding/incontinence  issues) syncope, focal weakness, memory loss,  skin/hair/nail changes,depression, anxiety, abnormal bruising/bleeding, or significant  musculoskeletal symptoms/signs.  He will have numbness and tingling in ulnar distribution  after sleeping in recliner occasionally.  Labs 06/11/2010 were reviewed. Glucose was minimally elevated at 103. AST was 59 and ALT 80. Triglycerides were 110.     Objective:   Physical Exam Gen.: Healthy and well-nourished in appearance. Alert, appropriate and cooperative throughout exam. Head: Normocephalic without obvious abnormalities; goatee  Eyes: No corneal or conjunctival inflammation noted. Pupils equal round reactive to light and accommodation. Fundal exam is benign without hemorrhages, exudate, papilledema. Extraocular motion intact. Vision grossly normal with lenses. Ears: External  ear exam reveals no significant lesions or deformities. Canals clear .TMs normal. Hearing is grossly normal bilaterally. Nose: External nasal exam reveals no deformity or inflammation. Nasal mucosa are pink and moist. No lesions or exudates noted.  Mouth: Oral mucosa and oropharynx reveal no lesions or exudates. Teeth in good repair. Neck: No deformities, masses, or tenderness noted. Range of motion &. Thyroid normal( chiefly SS in location). Lungs: Normal respiratory effort; chest expands  symmetrically. Lungs are clear to auscultation without rales, wheezes, or increased work of breathing. Heart: Normal rate and rhythm. Normal S1 and S2. No gallop, click, or rub. No  murmur. Abdomen: Bowel sounds normal; abdomen soft and nontender. No masses, organomegaly or hernias noted. No palpable  AAA  Genitalia/DRE: Normal exam with no prostate enlargement or nodularity.   .                                                                                   Musculoskeletal/extremities: No deformity or scoliosis noted of  the thoracic or lumbar spine. No clubbing, cyanosis, edema, or deformity noted. Range of motion  normal .Tone & strength  normal.Joints normal. Nail health  good. Vascular: Carotid, radial artery, dorsalis pedis and  posterior tibial pulses are full and equal. No bruits present. Neurologic: Alert and oriented x3. Deep tendon reflexes symmetrical and normal.          Skin: Intact without suspicious lesions or rashes. Lymph: No cervical, axillary, or inguinal lymphadenopathy present. Psych: Mood and affect are normal. Normally interactive  Assessment & Plan:  #1 comprehensive physical exam; no acute findings #2 see Problem List with Assessments & Recommendations Plan: see Orders

## 2010-12-29 ENCOUNTER — Other Ambulatory Visit: Payer: Self-pay | Admitting: Cardiovascular Disease

## 2011-01-05 ENCOUNTER — Telehealth: Payer: Self-pay | Admitting: Cardiovascular Disease

## 2011-01-05 NOTE — Telephone Encounter (Signed)
Crestor 5 mg --7 tablets left at front desk. Crestor savings card also left with meds.

## 2011-01-05 NOTE — Telephone Encounter (Signed)
Spoke with pt and told him there was no generic for Crestor at this time. He states cost of med has increased recently.  I told pt I would leave samples of Crestor 5 mg at desk for him to pick up.  I also told pt about Crestor savings card available to pt's with insurance.  Pt has appointment with Dr. Excell Seltzer on January 11, 2011. He is due for lipid and liver profile at that time and will fast prior to appt.

## 2011-01-05 NOTE — Telephone Encounter (Signed)
Pt requesting generic for crestor, uses [facility] [place], 

## 2011-01-11 ENCOUNTER — Other Ambulatory Visit: Payer: Self-pay | Admitting: Cardiology

## 2011-01-11 ENCOUNTER — Ambulatory Visit (INDEPENDENT_AMBULATORY_CARE_PROVIDER_SITE_OTHER): Payer: Commercial Indemnity | Admitting: Cardiovascular Disease

## 2011-01-11 ENCOUNTER — Encounter: Payer: Self-pay | Admitting: Cardiovascular Disease

## 2011-01-11 ENCOUNTER — Encounter (INDEPENDENT_AMBULATORY_CARE_PROVIDER_SITE_OTHER): Payer: Commercial Indemnity | Admitting: Cardiology

## 2011-01-11 DIAGNOSIS — E785 Hyperlipidemia, unspecified: Secondary | ICD-10-CM

## 2011-01-11 DIAGNOSIS — I714 Abdominal aortic aneurysm, without rupture, unspecified: Secondary | ICD-10-CM

## 2011-01-11 DIAGNOSIS — I2510 Atherosclerotic heart disease of native coronary artery without angina pectoris: Secondary | ICD-10-CM

## 2011-01-11 DIAGNOSIS — E7800 Pure hypercholesterolemia, unspecified: Secondary | ICD-10-CM

## 2011-01-11 DIAGNOSIS — I10 Essential (primary) hypertension: Secondary | ICD-10-CM

## 2011-01-11 LAB — BASIC METABOLIC PANEL
CO2: 28 mEq/L (ref 19–32)
Calcium: 9.1 mg/dL (ref 8.4–10.5)
GFR: 96.48 mL/min (ref >60.00)
Glucose, Bld: 103 mg/dL — ABNORMAL HIGH (ref 70–99)
Potassium: 3.8 mEq/L (ref 3.5–5.1)
Sodium: 137 mEq/L (ref 135–145)

## 2011-01-11 LAB — HEPATIC FUNCTION PANEL
AST: 46 U/L — ABNORMAL HIGH (ref 0–37)
Albumin: 4.2 g/dL (ref 3.5–5.2)
Total Bilirubin: 1 mg/dL (ref 0.3–1.2)

## 2011-01-11 LAB — LIPID PANEL
HDL: 46.6 mg/dL (ref >39.00)
Total CHOL/HDL Ratio: 3
Triglycerides: 96 mg/dL (ref 0.0–149.0)
VLDL: 19.2 mg/dL (ref 0.0–40.0)

## 2011-01-11 MED ORDER — ATORVASTATIN CALCIUM 10 MG PO TABS: 10.0000 mg | ORAL_TABLET | Freq: Every day | ORAL | Status: DC

## 2011-01-11 NOTE — Assessment & Plan Note (Signed)
Stable without angina. Continue dual antiplatelet therapy with aspirin and Plavix. Secondary risk reduction measures including statin therapy with low-dose Crestor. See discussion under 'hyperlipidemia.'

## 2011-01-11 NOTE — Assessment & Plan Note (Signed)
Blood pressure is very well controlled on combination of losartan, hydrochlorothiazide, and metoprolol

## 2011-01-11 NOTE — Assessment & Plan Note (Signed)
The patient requests a change from receive a statin to atorvastatin. He is taking 5 mg of Crestor we will change him to 10 mg of Lipitor. He is due for followup lipids and LFTs today. The patient has had elevated LFTs in the past and this needs to continue to be under close monitoring.

## 2011-01-11 NOTE — Progress Notes (Signed)
HPI:  This is a 60 year old gentleman presenting for followup evaluation. He is followed for coronary disease, hypertension, and abdominal aortic aneurysm. The patient has been undergoing semiannual surveillance of his abdominal aneurysm. 6 months ago his maximum dimensions were 5.2 x 5.2 cm. He is going to undergo repeat duplex scanning today. Overall he is doing well. He denies chest pain, abdominal pain, dyspnea, or edema. He reports compliance with his medications. He is very active and continues to do regular outdoor work. He is not engaged in formal exercise.  Outpatient Encounter Prescriptions as of 01/11/2011  Medication Sig Dispense Refill  . aspirin 81 MG tablet Take 81 mg by mouth daily.        . clopidogrel (PLAVIX) 75 MG tablet Take 75 mg by mouth daily.        Marland Kitchen losartan-hydrochlorothiazide (HYZAAR) 100-25 MG per tablet Take 1 tablet by mouth daily.  30 tablet  6  . metoprolol (TOPROL-XL) 100 MG 24 hr tablet Take 100 mg by mouth daily.        . Multiple Vitamin (MULTIVITAMIN) capsule Take 1 capsule by mouth daily.        . rosuvastatin (CRESTOR) 10 MG tablet Take 10 mg by mouth daily.       . vitamin E 400 UNIT capsule Take 400 Units by mouth daily.        Marland Kitchen DISCONTD: CRESTOR 10 MG tablet TAKE 1 TABLET BY MOUTH EVERY DAY  30 tablet  5  . DISCONTD: Omega-3 Fatty Acids (FISH OIL) 1200 MG CAPS Take 1 capsule by mouth daily.          No Known Allergies  Past Medical History  Diagnosis Date  . CAD (coronary artery disease)     DES to RCA and CFX  . AAA (abdominal aortic aneurysm)   . HTN (hypertension)     unspecified essential  . Dyslipidemia   . Left lumbar radiculopathy   . Hyperplasia of prostate without lower urinary tract symptoms (LUTS)   . Other and unspecified hyperlipidemia   . Hx of adenomatous colonic polyps 2008  . Depression     ROS: Negative except as per HPI  BP 118/79  Pulse 64  Resp 14  Ht 6\' 1"  (1.854 m)  Wt 284 lb (128.822 kg)  BMI 37.47  kg/m2  PHYSICAL EXAM: Pt is alert and oriented, overweight male in NAD HEENT: normal Neck: JVP - normal, carotids 2+= without bruits Lungs: CTA bilaterally CV: RRR without murmur or gallop Abd: soft, obese, NT, Positive BS, no hepatomegaly Ext: no C/C/E, distal pulses intact and equal Skin: warm/dry no rash  EKG:  Normal sinus rhythm 64 beats per minute, within normal limits.  ASSESSMENT AND PLAN:

## 2011-01-11 NOTE — Patient Instructions (Addendum)
Your physician wants you to follow-up in: 6 months.  You will receive a reminder letter in the mail two months in advance. If you don't receive a letter, please call our office to schedule the follow-up appointment.  Your physician has recommended you make the following change in your medication: Stop Crestor. Start Lipitor (Atorvastatin) 10 mg by mouth daily.

## 2011-01-11 NOTE — Assessment & Plan Note (Signed)
The patient is approaching the need for repair of his aneurysm. He is asymptomatic and he has been below the maximum size threshold for repair of 5.5 cm. He will have a repeat duplex scan today for surveillance. He has had previous CT angiography of the abdominal aorta and his dimensions correlate well with ultrasound. Will plan on referral to vascular surgery if the size of the aneurysm is greater than 5.5 cm in maximum diameter.

## 2011-01-14 ENCOUNTER — Encounter: Payer: Self-pay | Admitting: Cardiovascular Disease

## 2011-04-07 ENCOUNTER — Other Ambulatory Visit: Payer: Self-pay | Admitting: Cardiovascular Disease

## 2011-05-10 ENCOUNTER — Other Ambulatory Visit: Payer: Self-pay | Admitting: Cardiovascular Disease

## 2011-07-10 ENCOUNTER — Other Ambulatory Visit: Payer: Self-pay | Admitting: Cardiovascular Disease

## 2011-07-12 NOTE — Telephone Encounter (Signed)
Refilled generic hyzaar

## 2011-07-15 ENCOUNTER — Encounter: Payer: Self-pay | Admitting: Cardiovascular Disease

## 2011-07-15 ENCOUNTER — Ambulatory Visit (INDEPENDENT_AMBULATORY_CARE_PROVIDER_SITE_OTHER): Payer: Commercial Indemnity | Admitting: Cardiovascular Disease

## 2011-07-15 DIAGNOSIS — E785 Hyperlipidemia, unspecified: Secondary | ICD-10-CM

## 2011-07-15 DIAGNOSIS — I714 Abdominal aortic aneurysm, without rupture, unspecified: Secondary | ICD-10-CM

## 2011-07-15 DIAGNOSIS — I2510 Atherosclerotic heart disease of native coronary artery without angina pectoris: Secondary | ICD-10-CM

## 2011-07-15 DIAGNOSIS — I10 Essential (primary) hypertension: Secondary | ICD-10-CM

## 2011-07-15 LAB — HEPATIC FUNCTION PANEL
ALT: 85 U/L — ABNORMAL HIGH (ref 0–53)
AST: 68 U/L — ABNORMAL HIGH (ref 0–37)
Total Bilirubin: 1 mg/dL (ref 0.3–1.2)
Total Protein: 7.3 g/dL (ref 6.0–8.3)

## 2011-07-15 LAB — LIPID PANEL
Cholesterol: 122 mg/dL (ref 0–200)
HDL: 45.4 mg/dL (ref >39.00)
Triglycerides: 82 mg/dL (ref 0.0–149.0)

## 2011-07-15 LAB — BASIC METABOLIC PANEL
Calcium: 9.4 mg/dL (ref 8.4–10.5)
GFR: 86.92 mL/min (ref >60.00)
Sodium: 138 mEq/L (ref 135–145)

## 2011-07-15 NOTE — Assessment & Plan Note (Signed)
The patient is stable with ideal control of his blood pressure. He is due for a repeat duplex scan and is approaching the need for treatment of his aneurysm. We discussed this today and discussed the fact that I will likely refer him to vascular surgery in the near future.

## 2011-07-15 NOTE — Assessment & Plan Note (Signed)
Well-controlled on a combination of losartan/hydrochlorothiazide and metoprolol

## 2011-07-15 NOTE — Patient Instructions (Signed)
Your physician recommends that you have lab work today: BMP, LIPID, LIVER  Your physician has requested that you have an abdominal aorta duplex. During this test, an ultrasound is used to evaluate the aorta. Allow 30 minutes for this exam. Do not eat after midnight the day before and avoid carbonated beverages  Your physician wants you to follow-up in: 6 MONTHS.  You will receive a reminder letter in the mail two months in advance. If you don't receive a letter, please call our office to schedule the follow-up appointment.

## 2011-07-15 NOTE — Progress Notes (Signed)
HPI:  61 year old gentleman presented for followup of abdominal aortic aneurysm, hypertension, and coronary artery disease. [name] is doing well. He denies cardiopulmonary symptoms and specifically denies chest pain or pressure, dyspnea, edema, orthopnea, PND, or palpitations. He has no abdominal pain.  His last abdominal duplex from August 2012 showed that his infrarenal abdominal aortic aneurysm measured 5.4 x 5.3 cm. This was slight growth from his previous study where his maximum dimension was 5.2 cm.  Outpatient Encounter Prescriptions as of 07/15/2011  Medication Sig Dispense Refill  . aspirin 81 MG tablet Take 81 mg by mouth daily.        Marland Kitchen atorvastatin (LIPITOR) 10 MG tablet Take 1 tablet (10 mg total) by mouth daily.  30 tablet  11  . clopidogrel (PLAVIX) 75 MG tablet TAKE 1 TABLET BY MOUTH EVERY DAY  30 tablet  10  . losartan-hydrochlorothiazide (HYZAAR) 100-25 MG per tablet TAKE 1 TABLET BY MOUTH DAILY.  30 tablet  6  . metoprolol (TOPROL-XL) 100 MG 24 hr tablet TAKE ONE TABLET BY MOUTH DAILY  30 tablet  8  . Multiple Vitamin (MULTIVITAMIN) capsule Take 1 capsule by mouth daily.        . vitamin E 400 UNIT capsule Take 400 Units by mouth daily.          No Known Allergies  Past Medical History  Diagnosis Date  . CAD (coronary artery disease)     DES to RCA and CFX  . AAA (abdominal aortic aneurysm)   . HTN (hypertension)     unspecified essential  . Dyslipidemia   . Left lumbar radiculopathy   . Hyperplasia of prostate without lower urinary tract symptoms (LUTS)   . Other and unspecified hyperlipidemia   . Hx of adenomatous colonic polyps 2008  . Depression     ROS: Negative except as per HPI  BP 122/76  Pulse 64  Ht 6' (1.829 m)  Wt 130.545 kg (287 lb 12.8 oz)  BMI 39.03 kg/m2  PHYSICAL EXAM: Pt is alert and oriented, NAD HEENT: normal Neck: JVP - normal, carotids 2+= without bruits Lungs: CTA bilaterally CV: RRR without murmur or gallop Abd: soft, NT,  Positive BS, enlarged abdominal aorta is palpable Ext: no C/C/E, distal pulses intact and equal Skin: warm/dry no rash  EKG:  Normal sinus rhythm 64 beats per minute, within normal limits.  ASSESSMENT AND PLAN:

## 2011-07-15 NOTE — Assessment & Plan Note (Signed)
The patient is stable without angina. He has been on long-term oral antiplatelet therapy after treatment with first generation drug alluding stents. We'll continue the same for now.

## 2011-07-15 NOTE — Assessment & Plan Note (Signed)
Last LDL was 81. He has a history of elevated transaminases. Will followup lipids and LFTs today as he is fasting.

## 2011-07-16 ENCOUNTER — Encounter: Payer: Self-pay | Admitting: Gastroenterology

## 2011-07-21 ENCOUNTER — Encounter: Payer: Self-pay | Admitting: Gastroenterology

## 2011-08-04 ENCOUNTER — Encounter (INDEPENDENT_AMBULATORY_CARE_PROVIDER_SITE_OTHER): Payer: Commercial Indemnity

## 2011-08-04 DIAGNOSIS — I714 Abdominal aortic aneurysm, without rupture, unspecified: Secondary | ICD-10-CM

## 2011-08-04 DIAGNOSIS — I700 Atherosclerosis of aorta: Secondary | ICD-10-CM

## 2011-08-06 ENCOUNTER — Encounter: Payer: Commercial Indemnity | Admitting: Vascular Surgery

## 2011-08-09 ENCOUNTER — Other Ambulatory Visit: Payer: Self-pay | Admitting: *Deleted

## 2011-08-09 DIAGNOSIS — I714 Abdominal aortic aneurysm, without rupture, unspecified: Secondary | ICD-10-CM

## 2011-08-18 ENCOUNTER — Encounter: Payer: Self-pay | Admitting: Vascular Surgery

## 2011-08-19 ENCOUNTER — Encounter: Payer: Self-pay | Admitting: Vascular Surgery

## 2011-08-19 ENCOUNTER — Ambulatory Visit (INDEPENDENT_AMBULATORY_CARE_PROVIDER_SITE_OTHER): Payer: Commercial Indemnity | Admitting: Vascular Surgery

## 2011-08-19 ENCOUNTER — Ambulatory Visit
Admission: RE | Admit: 2011-08-19 | Discharge: 2011-08-19 | Disposition: A | Discharge status: Home / Self Care | Payer: Commercial Indemnity | Source: Ambulatory Visit | Attending: Vascular Surgery | Admitting: Vascular Surgery

## 2011-08-19 VITALS — BP 114/71 | HR 78 | Temp 98.2°F | Ht 72.0 in | Wt 283.0 lb

## 2011-08-19 DIAGNOSIS — I714 Abdominal aortic aneurysm, without rupture, unspecified: Secondary | ICD-10-CM

## 2011-08-19 MED ORDER — IOHEXOL 350 MG/ML SOLN
125.0000 mL | Freq: Once | INTRAVENOUS | Status: AC | PRN
  Administered 2011-08-19: 125 mL via INTRAVENOUS

## 2011-08-19 NOTE — Progress Notes (Signed)
[facility] [place] HISTORY AND PHYSICAL   History of Present Illness:  Patient is a 61 y.o. year old male who presents for evaluation of abdominal aortic aneurysm.  The aneurysm is currently 5.6 cm in diameter by CT performed today.  The patient denies abdominal pain.  The patient denies back pain.  The patient hahas family history of AAA in a grand parent.  The aneurysm has been slowly growing over time under surveillance ultrasound by Dr. Excell Seltzer.  Other medical problems include coronary artery disease status post stenting, hyperlipidemia, hypertension, tobacco abuse in the form of snuff.  These problems are all currently stable. I did discuss with the patient potentially quitting his tobacco use and he is not interested at this time  Past Medical History  Diagnosis Date  . CAD (coronary artery disease)     DES to RCA and CFX  . AAA (abdominal aortic aneurysm)   . Dyslipidemia   . Left lumbar radiculopathy   . Hyperplasia of prostate without lower urinary tract symptoms (LUTS)   . Other and unspecified hyperlipidemia   . Hx of adenomatous colonic polyps 2008  . Depression   . HTN (hypertension)     unspecified essential    Past Surgical History  Procedure Date  . Cardiac catheterization 2005  . Appendectomy     age 49  . Colonoscopy w/ polypectomy 2008    adenomatous  & hyperplastic polyp;due 2013  . Cystoscopy 2005    varicose veins in bladder, Dr Retta Diones  . Coronary artey stent 2002 & 2005     Social History History  Substance Use Topics  . Smoking status: Former Smoker    Quit date: 05/31/1993  . Smokeless tobacco: Current User    Types: Snuff  . Alcohol Use: 16.8 oz/week    14 Cans of beer, 14 Shots of liquor per week    Family History Family History  Problem Relation Age of Onset  . Coronary artery disease Father     CABG in 70s  . Diabetes Father   . Colon polyps Father   . Heart attack Father     ?  Marland Kitchen Cancer Father     non-small  cell carcinoma/bone CA  . Hypertension Father   . Heart disease Father   . Coronary artery disease Maternal Grandfather   . Coronary artery disease Paternal Grandfather   . Other Mother     varicose veins    Allergies  No Known Allergies   Current Outpatient Prescriptions  Medication Sig Dispense Refill  . aspirin 81 MG tablet Take 81 mg by mouth daily.        Marland Kitchen atorvastatin (LIPITOR) 10 MG tablet Take 1 tablet (10 mg total) by mouth daily.  30 tablet  11  . clopidogrel (PLAVIX) 75 MG tablet TAKE 1 TABLET BY MOUTH EVERY DAY  30 tablet  10  . fish oil-omega-3 fatty acids 1000 MG capsule Take 1 g by mouth daily.      Marland Kitchen losartan-hydrochlorothiazide (HYZAAR) 100-25 MG per tablet TAKE 1 TABLET BY MOUTH DAILY.  30 tablet  6  . metoprolol (TOPROL-XL) 100 MG 24 hr tablet TAKE ONE TABLET BY MOUTH DAILY  30 tablet  8  . Multiple Vitamin (MULTIVITAMIN) capsule Take 1 capsule by mouth daily.        . vitamin E 400 UNIT capsule Take 400 Units by mouth daily.         No current facility-administered medications for this visit.  Facility-Administered Medications Ordered in Other Visits  Medication Dose Route Frequency Provider Last Rate Last Dose  . iohexol (OMNIPAQUE) 350 MG/ML injection 125 mL  125 mL Intravenous Once PRN Medication Radiologist, MD   125 mL at 08/19/11 0846    ROS:   General:  No weight loss, Fever, chills  HEENT: No recent headaches, no nasal bleeding, no visual changes, no sore throat  Neurologic: No dizziness, blackouts, seizures. No recent symptoms of stroke or mini- stroke. No recent episodes of slurred speech, or temporary blindness.  Cardiac: No recent episodes of chest pain/pressure, no shortness of breath at rest.  No shortness of breath with exertion.  Denies history of atrial fibrillation or irregular heartbeat  Vascular: No history of rest pain in feet.  No history of claudication.  No history of non-healing ulcer, No history of DVT   Pulmonary: No home  oxygen, no productive cough, no hemoptysis,  No asthma or wheezing  Musculoskeletal:  [ ]  Arthritis, [ ]  Low back pain,  [ ]  Joint pain  Hematologic:No history of hypercoagulable state.  No history of easy bleeding.  No history of anemia  Gastrointestinal: No hematochezia or melena,  No gastroesophageal reflux, no trouble swallowing  Urinary: [ ]  chronic Kidney disease, [ ]  on HD - [ ]  MWF or [ ]  TTHS, [ ]  Burning with urination, [ ]  Frequent urination, [ ]  Difficulty urinating;   Skin: No rashes  Psychological: No history of anxiety,  No history of depression   Physical Examination  Filed Vitals:   08/19/11 1225  BP: 114/71  Pulse: 78  Temp: 98.2 F (36.8 C)  Height: 6' (1.829 m)  Weight: 283 lb (128.368 kg)  SpO2: 98%    Body mass index is 38.38 kg/(m^2).  General:  Alert and oriented, no acute distress HEENT: Normal Neck: No bruit or JVD Pulmonary: Clear to auscultation bilaterally Cardiac: Regular Rate and Rhythm without murmur Gastrointestinal: Soft, non-tender, non-distended, no mass, right lower quadrant scar Skin: No rash Extremity Pulses:  2+ radial, brachial, femoral, 2+ dorsalis pedis right ,2+ posterior tibial pulse left Musculoskeletal: No deformity or edema  Neurologic: Upper and lower extremity motor 5/5 and symmetric  DATA: CT angiogram the abdomen and pelvis is reviewed today. He has an adequate neck for aortic stent grafting. The aneurysm is 5. 6 cm with no iliac aneurysm there is mild to moderate tortuosity of iliacs   ASSESSMENT: Abdominal aortic aneurysm of size it now warrants repair   PLAN:  The patient should be a candidate for Gore Excluder stent graft repair. We have scheduled this for the second week in April. Risks benefits possible complications procedure details of open versus stent graft repair were explained the patient today. He understands and agrees to proceed. He has been seen by Dr. Excell Seltzer recently and is okay from a cardiac  standpoint to undergo the operation.   Fabienne Bruns, MD [facility] [place] Office: [phone] Pager: [phone]

## 2011-08-23 ENCOUNTER — Other Ambulatory Visit: Payer: Self-pay

## 2011-08-24 ENCOUNTER — Encounter (HOSPITAL_COMMUNITY): Payer: Self-pay | Admitting: Pharmacy Technician

## 2011-08-30 DIAGNOSIS — I714 Abdominal aortic aneurysm, without rupture, unspecified: Secondary | ICD-10-CM

## 2011-08-30 HISTORY — DX: Abdominal aortic aneurysm, without rupture, unspecified: I71.40

## 2011-08-30 HISTORY — DX: Abdominal aortic aneurysm, without rupture: I71.4

## 2011-08-31 ENCOUNTER — Encounter (HOSPITAL_COMMUNITY)
Admission: RE | Admit: 2011-08-31 | Discharge: 2011-08-31 | Disposition: A | Discharge status: Home / Self Care | Payer: Managed Care, Other (non HMO) | Source: Ambulatory Visit | Attending: Anesthesiology | Admitting: Anesthesiology

## 2011-08-31 ENCOUNTER — Encounter (HOSPITAL_COMMUNITY)
Admission: RE | Admit: 2011-08-31 | Discharge: 2011-08-31 | Disposition: A | Discharge status: Home / Self Care | Payer: Managed Care, Other (non HMO) | Source: Ambulatory Visit | Attending: Vascular Surgery | Admitting: Vascular Surgery

## 2011-08-31 ENCOUNTER — Encounter (HOSPITAL_COMMUNITY): Payer: Self-pay

## 2011-08-31 LAB — DIFFERENTIAL
Eosinophils Relative: 3 % (ref 0–5)
Lymphocytes Relative: 41 % (ref 12–46)
Lymphs Abs: 1.7 10*3/uL (ref 0.7–4.0)

## 2011-08-31 LAB — CBC
MCH: 34.7 pg — ABNORMAL HIGH (ref 26.0–34.0)
MCHC: 36 g/dL (ref 30.0–36.0)
Platelets: 158 10*3/uL (ref 150–400)
RDW: 12.8 % (ref 11.5–15.5)

## 2011-08-31 LAB — COMPREHENSIVE METABOLIC PANEL
ALT: 58 U/L — ABNORMAL HIGH (ref 0–53)
AST: 46 U/L — ABNORMAL HIGH (ref 0–37)
Calcium: 9.4 mg/dL (ref 8.4–10.5)
GFR calc Af Amer: >90 mL/min (ref >90)
Sodium: 136 mEq/L (ref 135–145)
Total Protein: 6.9 g/dL (ref 6.0–8.3)

## 2011-08-31 LAB — BLOOD GAS, ARTERIAL
Drawn by: 206361
FIO2: 0.21 %
O2 Saturation: 96.9 %
Patient temperature: 98.6

## 2011-08-31 LAB — URINALYSIS, ROUTINE W REFLEX MICROSCOPIC
Hgb urine dipstick: NEGATIVE
Leukocytes, UA: NEGATIVE
Nitrite: NEGATIVE
Specific Gravity, Urine: 1.017 (ref 1.005–1.030)
Urobilinogen, UA: 1 mg/dL (ref 0.0–1.0)

## 2011-08-31 LAB — APTT: aPTT: 26 seconds (ref 24–37)

## 2011-08-31 LAB — PROTIME-INR: Prothrombin Time: 13.4 seconds (ref 11.6–15.2)

## 2011-08-31 LAB — SURGICAL PCR SCREEN: MRSA, PCR: NEGATIVE

## 2011-08-31 NOTE — Pre-Procedure Instructions (Signed)
20 Alfred [name]  08/31/2011   Your procedure is scheduled on:  April 8  Report to [facility] at 0530 AM.  Call this number if you have problems the morning of surgery: [phone]   Remember:   Do not eat food:After Midnight.  May have clear liquids: up to 4 Hours before arrival.  Clear liquids include soda, tea, black coffee, apple or grape juice, broth.  Take these medicines the morning of surgery with A SIP OF WATER: Metoprolol   STOP fish oil, multiple vitamins today  Do not wear jewelry, make-up or nail polish.  Do not wear lotions, powders, or perfumes. You may wear deodorant.  Do not shave 48 hours prior to surgery.  Do not bring valuables to the hospital.  Contacts, dentures or bridgework may not be worn into surgery.  Leave suitcase in the car. After surgery it may be brought to your room.  For patients admitted to the hospital, checkout time is 11:00 AM the day of discharge.   Patients discharged the day of surgery will not be allowed to drive home.  Special Instructions: CHG Shower Use Special Wash: 1/2 bottle night before surgery and 1/2 bottle morning of surgery.   Please read over the following fact sheets that you were given: Pain Booklet, Coughing and Deep Breathing, Blood Transfusion Information and Surgical Site Infection Prevention

## 2011-08-31 NOTE — Progress Notes (Signed)
At patient request type and screen will be drawn DOS. 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

## 2011-09-05 MED ORDER — DEXTROSE 5 % IV SOLN
1.5000 g | Freq: Once | INTRAVENOUS | Status: DC
  Filled 2011-09-05: qty 1.5

## 2011-09-06 ENCOUNTER — Ambulatory Visit (HOSPITAL_COMMUNITY): Payer: Managed Care, Other (non HMO)

## 2011-09-06 ENCOUNTER — Ambulatory Visit (HOSPITAL_COMMUNITY): Payer: Managed Care, Other (non HMO) | Admitting: Certified Registered"

## 2011-09-06 ENCOUNTER — Encounter (HOSPITAL_COMMUNITY): Payer: Self-pay | Admitting: *Deleted

## 2011-09-06 ENCOUNTER — Inpatient Hospital Stay (HOSPITAL_COMMUNITY)
Admission: RE | Admit: 2011-09-06 | Discharge: 2011-09-07 | DRG: 238 | Disposition: A | Discharge status: Home / Self Care | Payer: Managed Care, Other (non HMO) | Source: Ambulatory Visit | Attending: Vascular Surgery | Admitting: Vascular Surgery

## 2011-09-06 ENCOUNTER — Encounter (HOSPITAL_COMMUNITY): Payer: Self-pay | Admitting: Certified Registered"

## 2011-09-06 ENCOUNTER — Encounter (HOSPITAL_COMMUNITY)
Admission: RE | Disposition: A | Discharge status: Home / Self Care | Payer: Self-pay | Source: Ambulatory Visit | Attending: Vascular Surgery

## 2011-09-06 DIAGNOSIS — Z01818 Encounter for other preprocedural examination: Secondary | ICD-10-CM

## 2011-09-06 DIAGNOSIS — Z6837 Body mass index (BMI) 37.0-37.9, adult: Secondary | ICD-10-CM

## 2011-09-06 DIAGNOSIS — Z7982 Long term (current) use of aspirin: Secondary | ICD-10-CM

## 2011-09-06 DIAGNOSIS — Z01812 Encounter for preprocedural laboratory examination: Secondary | ICD-10-CM

## 2011-09-06 DIAGNOSIS — I714 Abdominal aortic aneurysm, without rupture, unspecified: Principal | ICD-10-CM | POA: Diagnosis present

## 2011-09-06 DIAGNOSIS — F17200 Nicotine dependence, unspecified, uncomplicated: Secondary | ICD-10-CM | POA: Diagnosis present

## 2011-09-06 DIAGNOSIS — N400 Enlarged prostate without lower urinary tract symptoms: Secondary | ICD-10-CM | POA: Diagnosis present

## 2011-09-06 DIAGNOSIS — IMO0002 Reserved for concepts with insufficient information to code with codable children: Secondary | ICD-10-CM | POA: Diagnosis not present

## 2011-09-06 DIAGNOSIS — E785 Hyperlipidemia, unspecified: Secondary | ICD-10-CM | POA: Diagnosis present

## 2011-09-06 DIAGNOSIS — Z79899 Other long term (current) drug therapy: Secondary | ICD-10-CM

## 2011-09-06 DIAGNOSIS — Y838 Other surgical procedures as the cause of abnormal reaction of the patient, or of later complication, without mention of misadventure at the time of the procedure: Secondary | ICD-10-CM | POA: Diagnosis not present

## 2011-09-06 DIAGNOSIS — I2510 Atherosclerotic heart disease of native coronary artery without angina pectoris: Secondary | ICD-10-CM | POA: Diagnosis present

## 2011-09-06 DIAGNOSIS — I10 Essential (primary) hypertension: Secondary | ICD-10-CM | POA: Diagnosis present

## 2011-09-06 DIAGNOSIS — F3289 Other specified depressive episodes: Secondary | ICD-10-CM | POA: Diagnosis present

## 2011-09-06 DIAGNOSIS — F329 Major depressive disorder, single episode, unspecified: Secondary | ICD-10-CM | POA: Diagnosis present

## 2011-09-06 DIAGNOSIS — Z9861 Coronary angioplasty status: Secondary | ICD-10-CM

## 2011-09-06 DIAGNOSIS — Z7902 Long term (current) use of antithrombotics/antiplatelets: Secondary | ICD-10-CM

## 2011-09-06 DIAGNOSIS — Z860100 Personal history of colon polyps, unspecified: Secondary | ICD-10-CM

## 2011-09-06 HISTORY — PX: ABDOMINAL AORTIC ANEURYSM REPAIR: SUR1152

## 2011-09-06 HISTORY — PX: ARTERY REPAIR: SHX5117

## 2011-09-06 LAB — CBC
Hemoglobin: 12 g/dL — ABNORMAL LOW (ref 13.0–17.0)
Platelets: 137 10*3/uL — ABNORMAL LOW (ref 150–400)
RBC: 3.47 MIL/uL — ABNORMAL LOW (ref 4.22–5.81)
WBC: 7.5 10*3/uL (ref 4.0–10.5)

## 2011-09-06 LAB — ABO/RH: ABO/RH(D): O POS

## 2011-09-06 LAB — BASIC METABOLIC PANEL
BUN: 18 mg/dL (ref 6–23)
Calcium: 8.4 mg/dL (ref 8.4–10.5)
GFR calc Af Amer: >90 mL/min (ref >90)
GFR calc non Af Amer: >90 mL/min (ref >90)
Potassium: 3.4 mEq/L — ABNORMAL LOW (ref 3.5–5.1)
Sodium: 141 mEq/L (ref 135–145)

## 2011-09-06 LAB — PROTIME-INR: INR: 1.5 — ABNORMAL HIGH (ref 0.00–1.49)

## 2011-09-06 SURGERY — INSERTION, ENDOVASCULAR STENT GRAFT, AORTA, ABDOMINAL
Anesthesia: General | Site: Groin | Wound class: Clean

## 2011-09-06 MED ORDER — DEXTROSE 5 % IV SOLN
1.5000 g | INTRAVENOUS | Status: DC | PRN
  Administered 2011-09-06: 1.5 g via INTRAVENOUS

## 2011-09-06 MED ORDER — BISACODYL 5 MG PO TBEC: 5.0000 mg | DELAYED_RELEASE_TABLET | Freq: Every day | ORAL | Status: DC | PRN

## 2011-09-06 MED ORDER — GUAIFENESIN-DM 100-10 MG/5ML PO SYRP: 15.0000 mL | ORAL_SOLUTION | ORAL | Status: DC | PRN

## 2011-09-06 MED ORDER — 0.9 % SODIUM CHLORIDE (POUR BTL) OPTIME
TOPICAL | Status: DC | PRN
  Administered 2011-09-06: 2000 mL

## 2011-09-06 MED ORDER — HEPARIN SODIUM (PORCINE) 1000 UNIT/ML IJ SOLN
INTRAMUSCULAR | Status: DC | PRN
  Administered 2011-09-06 (×2): 5000 [IU] via INTRAVENOUS
  Administered 2011-09-06: 10000 [IU] via INTRAVENOUS

## 2011-09-06 MED ORDER — LIDOCAINE HCL (CARDIAC) 20 MG/ML IV SOLN
INTRAVENOUS | Status: DC | PRN
  Administered 2011-09-06: 70 mg via INTRAVENOUS

## 2011-09-06 MED ORDER — ACETAMINOPHEN 650 MG RE SUPP: 325.0000 mg | RECTAL | Status: DC | PRN

## 2011-09-06 MED ORDER — HYDROMORPHONE HCL PF 1 MG/ML IJ SOLN
0.2500 mg | INTRAMUSCULAR | Status: DC | PRN
  Administered 2011-09-06 (×2): 0.5 mg via INTRAVENOUS

## 2011-09-06 MED ORDER — PROPOFOL 10 MG/ML IV EMUL
INTRAVENOUS | Status: DC | PRN
  Administered 2011-09-06: 200 mg via INTRAVENOUS

## 2011-09-06 MED ORDER — LOSARTAN POTASSIUM-HCTZ 100-25 MG PO TABS
1.0000 | ORAL_TABLET | Freq: Every day | ORAL | Status: DC
Start: 2011-09-06 — End: 2011-09-06

## 2011-09-06 MED ORDER — DOCUSATE SODIUM 100 MG PO CAPS: 100.0000 mg | ORAL_CAPSULE | Freq: Every day | ORAL | Status: DC

## 2011-09-06 MED ORDER — SODIUM CHLORIDE 0.9 % IV SOLN: 500.0000 mL | Freq: Once | INTRAVENOUS | Status: AC | PRN

## 2011-09-06 MED ORDER — METOPROLOL TARTRATE 1 MG/ML IV SOLN: 2.0000 mg | INTRAVENOUS | Status: DC | PRN

## 2011-09-06 MED ORDER — OXYCODONE HCL 5 MG PO TABS: 5.0000 mg | ORAL_TABLET | ORAL | Status: DC | PRN

## 2011-09-06 MED ORDER — ASPIRIN 81 MG PO TABS: 81.0000 mg | ORAL_TABLET | Freq: Every day | ORAL | Status: DC

## 2011-09-06 MED ORDER — HYDRALAZINE HCL 20 MG/ML IJ SOLN: 10.0000 mg | INTRAMUSCULAR | Status: DC | PRN

## 2011-09-06 MED ORDER — POTASSIUM CHLORIDE IN NACL 20-0.9 MEQ/L-% IV SOLN
INTRAVENOUS | Status: DC
  Administered 2011-09-06: 125 mL/h via INTRAVENOUS
  Administered 2011-09-07: 03:00:00 via INTRAVENOUS
  Filled 2011-09-06 (×4): qty 1000

## 2011-09-06 MED ORDER — ACETAMINOPHEN 325 MG PO TABS: 325.0000 mg | ORAL_TABLET | ORAL | Status: DC | PRN

## 2011-09-06 MED ORDER — SUFENTANIL CITRATE 50 MCG/ML IV SOLN
INTRAVENOUS | Status: DC | PRN
  Administered 2011-09-06: 10 ug via INTRAVENOUS
  Administered 2011-09-06: 30 ug via INTRAVENOUS

## 2011-09-06 MED ORDER — CLOPIDOGREL BISULFATE 75 MG PO TABS
75.0000 mg | ORAL_TABLET | Freq: Every day | ORAL | Status: DC
  Filled 2011-09-06 (×2): qty 1

## 2011-09-06 MED ORDER — SENNOSIDES-DOCUSATE SODIUM 8.6-50 MG PO TABS
1.0000 | ORAL_TABLET | Freq: Every evening | ORAL | Status: DC | PRN
  Filled 2011-09-06: qty 1

## 2011-09-06 MED ORDER — MORPHINE SULFATE 2 MG/ML IJ SOLN: 2.0000 mg | INTRAMUSCULAR | Status: DC | PRN

## 2011-09-06 MED ORDER — METOPROLOL SUCCINATE ER 100 MG PO TB24
100.0000 mg | ORAL_TABLET | Freq: Every day | ORAL | Status: DC
  Filled 2011-09-06: qty 1

## 2011-09-06 MED ORDER — ONDANSETRON HCL 4 MG/2ML IJ SOLN
4.0000 mg | Freq: Once | INTRAMUSCULAR | Status: AC | PRN
  Administered 2011-09-06: 4 mg via INTRAVENOUS

## 2011-09-06 MED ORDER — DOPAMINE-DEXTROSE 3.2-5 MG/ML-% IV SOLN: 3.0000 ug/kg/min | INTRAVENOUS | Status: DC

## 2011-09-06 MED ORDER — LORAZEPAM 2 MG/ML IJ SOLN
1.0000 mg | Freq: Once | INTRAMUSCULAR | Status: AC
  Administered 2011-09-07: 04:00:00 via INTRAVENOUS
  Filled 2011-09-06: qty 1

## 2011-09-06 MED ORDER — LACTATED RINGERS IV SOLN
INTRAVENOUS | Status: DC | PRN
  Administered 2011-09-06 (×3): via INTRAVENOUS

## 2011-09-06 MED ORDER — ASPIRIN EC 81 MG PO TBEC
81.0000 mg | DELAYED_RELEASE_TABLET | Freq: Every day | ORAL | Status: DC
  Filled 2011-09-06 (×2): qty 1

## 2011-09-06 MED ORDER — MIDAZOLAM HCL 5 MG/5ML IJ SOLN
INTRAMUSCULAR | Status: DC | PRN
  Administered 2011-09-06: 1 mg via INTRAVENOUS

## 2011-09-06 MED ORDER — ATORVASTATIN CALCIUM 10 MG PO TABS
10.0000 mg | ORAL_TABLET | Freq: Every day | ORAL | Status: DC
  Administered 2011-09-06: 10 mg via ORAL
  Filled 2011-09-06 (×2): qty 1

## 2011-09-06 MED ORDER — ROCURONIUM BROMIDE 100 MG/10ML IV SOLN
INTRAVENOUS | Status: DC | PRN
  Administered 2011-09-06: 50 mg via INTRAVENOUS

## 2011-09-06 MED ORDER — LABETALOL HCL 5 MG/ML IV SOLN: 10.0000 mg | INTRAVENOUS | Status: DC | PRN

## 2011-09-06 MED ORDER — MAGNESIUM SULFATE 40 MG/ML IJ SOLN
2.0000 g | Freq: Once | INTRAMUSCULAR | Status: AC | PRN
  Administered 2011-09-06: 2 g via INTRAVENOUS
  Filled 2011-09-06: qty 50

## 2011-09-06 MED ORDER — DEXTROSE 5 % IV SOLN
1.5000 g | Freq: Two times a day (BID) | INTRAVENOUS | Status: AC
  Administered 2011-09-06 – 2011-09-07 (×2): 1.5 g via INTRAVENOUS
  Filled 2011-09-06 (×2): qty 1.5

## 2011-09-06 MED ORDER — LOSARTAN POTASSIUM 50 MG PO TABS
100.0000 mg | ORAL_TABLET | Freq: Every day | ORAL | Status: DC
  Administered 2011-09-06: 100 mg via ORAL
  Filled 2011-09-06 (×2): qty 2

## 2011-09-06 MED ORDER — GLYCOPYRROLATE 0.2 MG/ML IJ SOLN
INTRAMUSCULAR | Status: DC | PRN
  Administered 2011-09-06: 0.4 mg via INTRAVENOUS

## 2011-09-06 MED ORDER — FAMOTIDINE IN NACL 20-0.9 MG/50ML-% IV SOLN
20.0000 mg | Freq: Two times a day (BID) | INTRAVENOUS | Status: DC
  Administered 2011-09-06: 20 mg via INTRAVENOUS
  Filled 2011-09-06 (×3): qty 50

## 2011-09-06 MED ORDER — ONDANSETRON HCL 4 MG/2ML IJ SOLN: 4.0000 mg | Freq: Four times a day (QID) | INTRAMUSCULAR | Status: DC | PRN

## 2011-09-06 MED ORDER — NEOSTIGMINE METHYLSULFATE 1 MG/ML IJ SOLN
INTRAMUSCULAR | Status: DC | PRN
  Administered 2011-09-06: 3 mg via INTRAVENOUS

## 2011-09-06 MED ORDER — VECURONIUM BROMIDE 10 MG IV SOLR
INTRAVENOUS | Status: DC | PRN
  Administered 2011-09-06 (×2): 2 mg via INTRAVENOUS

## 2011-09-06 MED ORDER — EPHEDRINE SULFATE 50 MG/ML IJ SOLN
INTRAMUSCULAR | Status: DC | PRN
  Administered 2011-09-06: 10 mg via INTRAVENOUS
  Administered 2011-09-06 (×3): 5 mg via INTRAVENOUS
  Administered 2011-09-06: 10 mg via INTRAVENOUS
  Administered 2011-09-06 (×2): 5 mg via INTRAVENOUS

## 2011-09-06 MED ORDER — PHENOL 1.4 % MT LIQD: 1.0000 | OROMUCOSAL | Status: DC | PRN

## 2011-09-06 MED ORDER — LABETALOL HCL 5 MG/ML IV SOLN
INTRAVENOUS | Status: DC | PRN
  Administered 2011-09-06: 5 mg via INTRAVENOUS

## 2011-09-06 MED ORDER — SODIUM CHLORIDE 0.9 % IR SOLN
Status: DC | PRN
  Administered 2011-09-06: 08:00:00

## 2011-09-06 MED ORDER — POTASSIUM CHLORIDE CRYS ER 20 MEQ PO TBCR
20.0000 meq | EXTENDED_RELEASE_TABLET | Freq: Once | ORAL | Status: AC | PRN
  Administered 2011-09-06: 20 meq via ORAL
  Filled 2011-09-06: qty 1

## 2011-09-06 MED ORDER — HYDROCHLOROTHIAZIDE 25 MG PO TABS
25.0000 mg | ORAL_TABLET | Freq: Every day | ORAL | Status: DC
  Administered 2011-09-06: 25 mg via ORAL
  Filled 2011-09-06 (×2): qty 1

## 2011-09-06 MED ORDER — IODIXANOL 320 MG/ML IV SOLN
INTRAVENOUS | Status: DC | PRN
  Administered 2011-09-06: 150 mL via INTRA_ARTERIAL

## 2011-09-06 MED ORDER — SODIUM CHLORIDE 0.9 % IV SOLN: INTRAVENOUS | Status: DC

## 2011-09-06 MED ORDER — PROTAMINE SULFATE 10 MG/ML IV SOLN
INTRAVENOUS | Status: DC | PRN
  Administered 2011-09-06: 50 mg via INTRAVENOUS

## 2011-09-06 SURGICAL SUPPLY — 85 items
ADH SKN CLS APL DERMABOND .7 (GAUZE/BANDAGES/DRESSINGS) ×4
BAG SNAP BAND KOVER 36X36 (MISCELLANEOUS) ×3 IMPLANT
BALLN CODA OCL 2-9.0-35-120-3 (BALLOONS)
BALLOON COD OCL 2-9.0-35-120-3 (BALLOONS) ×2 IMPLANT
CANISTER SUCTION 2500CC (MISCELLANEOUS) ×3 IMPLANT
CATH BEACON 5.038 65CM KMP-01 (CATHETERS) ×1 IMPLANT
CATH OMNI FLUSH .035X70CM (CATHETERS) ×1 IMPLANT
CLIP TI MEDIUM 24 (CLIP) ×1 IMPLANT
CLIP TI WIDE RED SMALL 24 (CLIP) ×1 IMPLANT
CLOTH BEACON ORANGE TIMEOUT ST (SAFETY) ×3 IMPLANT
COVER MAYO STAND STRL (DRAPES) ×3 IMPLANT
COVER PROBE W GEL 5X96 (DRAPES) ×1 IMPLANT
COVER SURGICAL LIGHT HANDLE (MISCELLANEOUS) ×6 IMPLANT
DERMABOND ADVANCED (GAUZE/BANDAGES/DRESSINGS) ×2
DERMABOND ADVANCED .7 DNX12 (GAUZE/BANDAGES/DRESSINGS) ×2 IMPLANT
DEVICE CLOSURE PERCLS PRGLD 6F (VASCULAR PRODUCTS) IMPLANT
DEVICE TORQUE 50000 (MISCELLANEOUS) ×1 IMPLANT
DRAIN CHANNEL 10F 3/8 F FF (DRAIN) IMPLANT
DRAIN CHANNEL 10M FLAT 3/4 FLT (DRAIN) IMPLANT
DRAPE C-ARM 42X72 X-RAY (DRAPES) ×3 IMPLANT
DRAPE PROXIMA HALF (DRAPES) ×2 IMPLANT
DRAPE TABLE COVER HEAVY DUTY (DRAPES) ×3 IMPLANT
DRESSING OPSITE X SMALL 2X3 (GAUZE/BANDAGES/DRESSINGS) ×2 IMPLANT
DRYSEAL FLEXSHEATH 18FR 33CM (SHEATH) ×1
ELECT CAUTERY BLADE 6.4 (BLADE) ×3 IMPLANT
ELECT REM PT RETURN 9FT ADLT (ELECTROSURGICAL) ×6
ELECTRODE REM PT RTRN 9FT ADLT (ELECTROSURGICAL) ×4 IMPLANT
EVACUATOR 3/16  PVC DRAIN (DRAIN)
EVACUATOR 3/16 PVC DRAIN (DRAIN) IMPLANT
EVACUATOR SILICONE 100CC (DRAIN) IMPLANT
EXCLUDER TRUNK (Endovascular Graft) ×1 IMPLANT
Excluder Contralateral Leg ×1 IMPLANT
Excluder Contralaterel Leg ×1 IMPLANT
GAUZE SPONGE 4X4 16PLY XRAY LF (GAUZE/BANDAGES/DRESSINGS) ×2 IMPLANT
GLOVE BIO SURGEON STRL SZ 6 (GLOVE) ×1 IMPLANT
GLOVE BIO SURGEON STRL SZ7.5 (GLOVE) ×3 IMPLANT
GLOVE BIOGEL PI IND STRL 6 (GLOVE) IMPLANT
GLOVE BIOGEL PI INDICATOR 6 (GLOVE) ×1
GOWN PREVENTION PLUS XLARGE (GOWN DISPOSABLE) ×5 IMPLANT
GOWN STRL NON-REIN LRG LVL3 (GOWN DISPOSABLE) ×12 IMPLANT
GRAFT BALLN CATH 65CM (STENTS) IMPLANT
GUIDEWIRE ANGLED .035X150CM (WIRE) ×1 IMPLANT
KIT BASIN OR (CUSTOM PROCEDURE TRAY) ×3 IMPLANT
KIT ROOM TURNOVER OR (KITS) ×3 IMPLANT
NDL PERC 18GX7CM (NEEDLE) ×2 IMPLANT
NEEDLE PERC 18GX7CM (NEEDLE) ×3 IMPLANT
NS IRRIG 1000ML POUR BTL (IV SOLUTION) ×6 IMPLANT
PACK AORTA (CUSTOM PROCEDURE TRAY) ×3 IMPLANT
PAD ARMBOARD 7.5X6 YLW CONV (MISCELLANEOUS) ×6 IMPLANT
PATCH VASCULAR VASCU GUARD 1X6 (Vascular Products) ×1 IMPLANT
PENCIL BUTTON HOLSTER BLD 10FT (ELECTRODE) IMPLANT
PERCLOSE PROGLIDE 6F (VASCULAR PRODUCTS) ×24
SHEATH AVANTI 11CM 8FR (MISCELLANEOUS) ×1 IMPLANT
SHEATH BRITE TIP 8FR 23CM (MISCELLANEOUS) ×1 IMPLANT
SHEATH DRYSEAL FLEX 18FR 33CM (SHEATH) IMPLANT
SHEATH DRYSEAL GORE 12FRX28 (SHEATH) ×1 IMPLANT
SPONGE GAUZE 4X4 12PLY (GAUZE/BANDAGES/DRESSINGS) ×2 IMPLANT
SPONGE LAP 18X18 X RAY DECT (DISPOSABLE) ×1 IMPLANT
SPONGE SURGIFOAM ABS GEL 100 (HEMOSTASIS) IMPLANT
STAPLER VISISTAT 35W (STAPLE) IMPLANT
STENT GRAFT BALLN CATH 65CM (STENTS) ×1
STOPCOCK MORSE 400PSI 3WAY (MISCELLANEOUS) ×3 IMPLANT
SUT PROLENE 5 0 C 1 24 (SUTURE) ×2 IMPLANT
SUT PROLENE 6 0 CC (SUTURE) ×12 IMPLANT
SUT SILK 3 0 (SUTURE) ×3
SUT SILK 3-0 18XBRD TIE 12 (SUTURE) IMPLANT
SUT VIC AB 2-0 CT1 27 (SUTURE) ×6
SUT VIC AB 2-0 CT1 TAPERPNT 27 (SUTURE) IMPLANT
SUT VIC AB 3-0 SH 27 (SUTURE) ×6
SUT VIC AB 3-0 SH 27X BRD (SUTURE) IMPLANT
SUT VICRYL 4-0 PS2 18IN ABS (SUTURE) ×6 IMPLANT
SYR 20CC LL (SYRINGE) ×6 IMPLANT
SYR 30ML LL (SYRINGE) ×1 IMPLANT
SYR 5ML LL (SYRINGE) ×3 IMPLANT
SYR MEDRAD MARK V 150ML (SYRINGE) ×3 IMPLANT
SYRINGE 10CC LL (SYRINGE) ×9 IMPLANT
TAPE CLOTH SURG 4X10 WHT LF (GAUZE/BANDAGES/DRESSINGS) ×2 IMPLANT
TOWEL OR 17X24 6PK STRL BLUE (TOWEL DISPOSABLE) ×8 IMPLANT
TOWEL OR 17X26 10 PK STRL BLUE (TOWEL DISPOSABLE) ×6 IMPLANT
TRAY FOLEY CATH 14FRSI W/METER (CATHETERS) ×3 IMPLANT
TUBING HIGH PRESSURE 120CM (CONNECTOR) ×3 IMPLANT
VISIPAQUE 150ML ×1 IMPLANT
WATER STERILE IRR 1000ML POUR (IV SOLUTION) ×3 IMPLANT
WIRE AMPLATZ SS-J .035X180CM (WIRE) ×2 IMPLANT
WIRE BENTSON .035X145CM (WIRE) ×2 IMPLANT

## 2011-09-06 NOTE — Transfer of Care (Signed)
Immediate Anesthesia Transfer of Care Note  Patient: Alfred [name]  Procedure(s) Performed: Procedure(s) (LRB): ABDOMINAL AORTIC ENDOVASCULAR STENT GRAFT (N/A) ARTERY REPAIR (Bilateral)  Patient Location: PACU  Anesthesia Type: General  Level of Consciousness: awake, alert , oriented and patient cooperative  Airway & Oxygen Therapy: Patient Spontanous Breathing and Patient connected to face mask oxygen  Post-op Assessment: Report given to PACU RN  Post vital signs: Reviewed and stable  Complications: No apparent anesthesia complications

## 2011-09-06 NOTE — H&P (View-Only) (Signed)
[facility] [place] HISTORY AND PHYSICAL   History of Present Illness:  Patient is a 61 y.o. year old male who presents for evaluation of abdominal aortic aneurysm.  The aneurysm is currently 5.6 cm in diameter by CT performed today.  The patient denies abdominal pain.  The patient denies back pain.  The patient hahas family history of AAA in a grand parent.  The aneurysm has been slowly growing over time under surveillance ultrasound by Dr. Excell Seltzer.  Other medical problems include coronary artery disease status post stenting, hyperlipidemia, hypertension, tobacco abuse in the form of snuff.  These problems are all currently stable. I did discuss with the patient potentially quitting his tobacco use and he is not interested at this time  Past Medical History  Diagnosis Date  . CAD (coronary artery disease)     DES to RCA and CFX  . AAA (abdominal aortic aneurysm)   . Dyslipidemia   . Left lumbar radiculopathy   . Hyperplasia of prostate without lower urinary tract symptoms (LUTS)   . Other and unspecified hyperlipidemia   . Hx of adenomatous colonic polyps 2008  . Depression   . HTN (hypertension)     unspecified essential    Past Surgical History  Procedure Date  . Cardiac catheterization 2005  . Appendectomy     age 45  . Colonoscopy w/ polypectomy 2008    adenomatous  & hyperplastic polyp;due 2013  . Cystoscopy 2005    varicose veins in bladder, Dr Retta Diones  . Coronary artey stent 2002 & 2005     Social History History  Substance Use Topics  . Smoking status: Former Smoker    Quit date: 05/31/1993  . Smokeless tobacco: Current User    Types: Snuff  . Alcohol Use: 16.8 oz/week    14 Cans of beer, 14 Shots of liquor per week    Family History Family History  Problem Relation Age of Onset  . Coronary artery disease Father     CABG in 25s  . Diabetes Father   . Colon polyps Father   . Heart attack Father     ?  Marland Kitchen Cancer Father     non-small  cell carcinoma/bone CA  . Hypertension Father   . Heart disease Father   . Coronary artery disease Maternal Grandfather   . Coronary artery disease Paternal Grandfather   . Other Mother     varicose veins    Allergies  No Known Allergies   Current Outpatient Prescriptions  Medication Sig Dispense Refill  . aspirin 81 MG tablet Take 81 mg by mouth daily.        Marland Kitchen atorvastatin (LIPITOR) 10 MG tablet Take 1 tablet (10 mg total) by mouth daily.  30 tablet  11  . clopidogrel (PLAVIX) 75 MG tablet TAKE 1 TABLET BY MOUTH EVERY DAY  30 tablet  10  . fish oil-omega-3 fatty acids 1000 MG capsule Take 1 g by mouth daily.      Marland Kitchen losartan-hydrochlorothiazide (HYZAAR) 100-25 MG per tablet TAKE 1 TABLET BY MOUTH DAILY.  30 tablet  6  . metoprolol (TOPROL-XL) 100 MG 24 hr tablet TAKE ONE TABLET BY MOUTH DAILY  30 tablet  8  . Multiple Vitamin (MULTIVITAMIN) capsule Take 1 capsule by mouth daily.        . vitamin E 400 UNIT capsule Take 400 Units by mouth daily.         No current facility-administered medications for this visit.  Facility-Administered Medications Ordered in Other Visits  Medication Dose Route Frequency Provider Last Rate Last Dose  . iohexol (OMNIPAQUE) 350 MG/ML injection 125 mL  125 mL Intravenous Once PRN Medication Radiologist, MD   125 mL at 08/19/11 0846    ROS:   General:  No weight loss, Fever, chills  HEENT: No recent headaches, no nasal bleeding, no visual changes, no sore throat  Neurologic: No dizziness, blackouts, seizures. No recent symptoms of stroke or mini- stroke. No recent episodes of slurred speech, or temporary blindness.  Cardiac: No recent episodes of chest pain/pressure, no shortness of breath at rest.  No shortness of breath with exertion.  Denies history of atrial fibrillation or irregular heartbeat  Vascular: No history of rest pain in feet.  No history of claudication.  No history of non-healing ulcer, No history of DVT   Pulmonary: No home  oxygen, no productive cough, no hemoptysis,  No asthma or wheezing  Musculoskeletal:  [ ]  Arthritis, [ ]  Low back pain,  [ ]  Joint pain  Hematologic:No history of hypercoagulable state.  No history of easy bleeding.  No history of anemia  Gastrointestinal: No hematochezia or melena,  No gastroesophageal reflux, no trouble swallowing  Urinary: [ ]  chronic Kidney disease, [ ]  on HD - [ ]  MWF or [ ]  TTHS, [ ]  Burning with urination, [ ]  Frequent urination, [ ]  Difficulty urinating;   Skin: No rashes  Psychological: No history of anxiety,  No history of depression   Physical Examination  Filed Vitals:   08/19/11 1225  BP: 114/71  Pulse: 78  Temp: 98.2 F (36.8 C)  Height: 6' (1.829 m)  Weight: 283 lb (128.368 kg)  SpO2: 98%    Body mass index is 38.38 kg/(m^2).  General:  Alert and oriented, no acute distress HEENT: Normal Neck: No bruit or JVD Pulmonary: Clear to auscultation bilaterally Cardiac: Regular Rate and Rhythm without murmur Gastrointestinal: Soft, non-tender, non-distended, no mass, right lower quadrant scar Skin: No rash Extremity Pulses:  2+ radial, brachial, femoral, 2+ dorsalis pedis right ,2+ posterior tibial pulse left Musculoskeletal: No deformity or edema  Neurologic: Upper and lower extremity motor 5/5 and symmetric  DATA: CT angiogram the abdomen and pelvis is reviewed today. He has an adequate neck for aortic stent grafting. The aneurysm is 5. 6 cm with no iliac aneurysm there is mild to moderate tortuosity of iliacs   ASSESSMENT: Abdominal aortic aneurysm of size it now warrants repair   PLAN:  The patient should be a candidate for Gore Excluder stent graft repair. We have scheduled this for the second week in April. Risks benefits possible complications procedure details of open versus stent graft repair were explained the patient today. He understands and agrees to proceed. He has been seen by Dr. Excell Seltzer recently and is okay from a cardiac  standpoint to undergo the operation.   Fabienne Bruns, MD [facility] [place] Office: [phone] Pager: [phone]

## 2011-09-06 NOTE — Progress Notes (Signed)
09/06/2011 1650 Pt arrived from pacu. Vss. Bilateral groin dressing are C,D,&I. Right DP and PT are palpable and left DP and PT are dopplerable. Patient has no complaints of pain. Safety discussed. Bed in low positon and call bell within reach. Family at bedside. Will continue to monitor. Celesta Gentile

## 2011-09-06 NOTE — Preoperative (Signed)
Beta Blockers   Reason not to administer Beta Blockers:Not Applicable 

## 2011-09-06 NOTE — Anesthesia Procedure Notes (Addendum)
Procedure Name: Intubation Date/Time: 09/06/2011 7:51 AM Performed by: Glendora Score A Pre-anesthesia Checklist: Patient identified, Emergency Drugs available, Suction available, Patient being monitored and Timeout performed Patient Re-evaluated:Patient Re-evaluated prior to inductionOxygen Delivery Method: Circle system utilized Preoxygenation: Pre-oxygenation with 100% oxygen Intubation Type: IV induction Ventilation: Two handed mask ventilation required and Nasal airway inserted- appropriate to patient size Laryngoscope Size: Mac and 3 Grade View: Grade III Tube type: Oral Tube size: 7.5 mm Number of attempts: 1 Airway Equipment and Method: Stylet Secured at: 23 cm Tube secured with: Tape Dental Injury: Teeth and Oropharynx as per pre-operative assessment

## 2011-09-06 NOTE — Op Note (Signed)
Operative findings:   #1 Attempted Bilateral Proglide closure   #2 28x14x12 cm main body Gore Excluder device delivered via a right femoral system   #3 18 x 13.5 cm left iliac limb contralateral    #4 16 x 9.5 ipsilateral iliac extension right   #5 Patch angioplasty left common femoral artery  #6 Cutdown and repair of right common femoral artery  PROCEDURE DETAIL: After obtaining informed consent the patient was taken to the operating room. The patient was placed in supine position the operating room table. After induction of general anesthesia and endotracheal intubation a Foley catheter was placed. Next the patient was prepped and draped in usual sterile fashion from the nipples down to the knees. Ultrasound was used to identify the right common femoral artery as well as the femoral bifurcation. An 11 blade was used to make a small neck in the skin over the level of the right common femoral artery. An introducer needle was then used to cannulate the right common femoral artery without difficulty. A 0.035 Bentson wire was then threaded up into the abdominal aorta through the right femoral system. A short 9 French dilator was placed over the guidewire the right femoral system. This was used to dilate the tract. The dilator was then removed and a Proglide device inserted over the guidewire into the right femoral system and this was deployed at the 2:00 position. The Proglide device was then removed and an additional Proglide device was brought in operative field and deployed at the 10:00 position. The sutures were kept separate and tagged with suture tags. Next the short 9 French sheath was then placed back over the guidewire into the right femoral system and the dilator removed and the sheath thoroughly flushed with heparinized saline. Attention was then turned to the left groin. Again using ultrasound the left common femoral artery was identified. The femoral bifurcation was also identified. A small  nick was made in the skin with the 11 blade. A hemostat was used to dilate a tract down to the artery. An introducer needle was then used to cannulate the left common femoral artery without difficulty. A 0.035 Bentson wire was then threaded up into the abdominal aorta under fluoroscopic guidance. A 9 French dilator was then placed over the wire to dilate the tract. Two Proglide devices were again brought on operative field and these were fired sequentially in the 10:00 position followed by an additional Proglide at the 2:00 position. The Proglide delivery systems were removed and the long 9 French sheath placed back over the guidewire up to the level of the iliac of the aortic bifurcation.  At this point, a 5 [place] Omni flush catheter was placed over the guidewire and the left groin up into the abdominal aorta. An abdominal aortogram was obtained in the AP position to determine level of the left and right renal arteries. At this point a 28 x 14 x 12 Gore Excluder main body device was selected. The Bentson wire from the right groin was advanced up into the descending thoracic aorta over a Kumpe catheter and the Bentsen wire replaced with an 035 Amplatz wire. The main body device was then placed over the Amplatz wire in the right groin and advanced up to the level of the renal arteries.    Magnified views of the renal arteries were performed to make sure that we were not covering these. The top portion of the stent graft was then deployed with the end of the stent  just below the level of the left renal artery and this came over to just below the level of the right renal artery. The main body was delivered all the way down to the contralateral gate. Attention was then turned to the left groin. The Omni flush catheter was pulled down over a guidewire and removed and the Bentson wire placed in position to cannulate the contralateral gate. The Kumpe catheter was placed back over the guidewire in the left femoral  system. A 5 [place] Kumpe catheter was placed over this and this was used to selectively catheterize the contralateral gate using and 035 angled Glidewire and the guidewire was then advanced into the descending thoracic aorta. The main body portion of the gate was confirmed by twirling the pigtail catheter. The pigtail  catheter was then placed in a location so that we could use its markers to determine the exact length to the iliac bifurcation. An Amplatz wire was placed back through the pigtail catheter. A retrograde contrast angiogram was performed to determine the level of the left internal iliac artery and a 18 x 13.5 cm iliac limb was selected. The pigtail catheter was removed over the guidewire and the 18 x 13.5 cm limb advanced so there was full coverage of the long marker on the contralateral limb. This was then deployed in the usual fashion down to the iliac bifurcation. The delivery system was then removed. The remainder of the ipsilateral iliac limb was also deployed.  A retrograde contrast angiogram was also performed to make sure that the right iliac limb did not cover the right internal iliac artery.  Measurement was made of the right iliac bifurcation and a 16 x 9.5 cm ipsilateral limb was placed.  Attention was then turned back to the left iliac system and a Gore aortic balloon placed over the wire up to the level of the proximal end of the stent and this was ballooned to profile. The limb attachment site was also ballooned as well as the distal attachment site. Attention was then turned to the right groin and the balloon was advanced over the guidewire on the right side and the distal attachment site as well as the midportion of iliac limb were also ballooned. The 5 [place] Omni flush catheter was then placed back to the guidewire on the right side and a completion arteriogram was obtained. This showed no evidence of proximal or distal type I endoleak. There was no type II endoleak. There is no  filling of the aneurysm sac.    At this point the Omni flush catheter was removed over a guidewire. All delivery devices were removed. We then proceeded to remove the large 18 French sheath from the right side with the guidewire in place. With pressure held above and below the insertion site the lateral Proglide closure was secured down.  An additional lateral Proglide was then placed over the guidewire and deployed.  There was poor hemostasis so an additional proglide was placed over the wire and fired in an AP orientation.  I snugged all of the proglides down once again and there was still bleeding so the 9 Fr sheath was placed back over the guidewire to achieve hemostasis.   The guidewire was removed from the right side.   Attention was then turned to the left groin. In similar fashion the 12 French sheath was removed and the guidewire left in place.  The lateral and medial Proglides were secured but were not hemostatic so an additional Proglides was  deployed over the wire to obtain hemostasis.  There was still poor hemostasis so a 9 Fr sheath was also placed back over the wire and the guidewire removed.   The patient had been given 10000 units of heparin before introducing the main body device. I gave an additional 5000 units of heparin and a cutdown was performed via an oblique incision in the left groin.  The was staining of the tissues and some oozing which made dissection more tedious but I was able to dissect the common femoral artery circumferentially above the sheath and a vessel loop placed around it.  I then dissected out the profunda and SFA and loops were placed around these.  The artery was controlled proximally with a Henley clamp and distally with loops and the sheath removed.  There was a tear in the lateral wall of the artery which required repair with several interrupted 6 0 prolenes.  To repair the artery it had to be opened some longitudinally so a bovine pericardial patch was brought up  and sewn on as a patch angioplasty using a running 6 0 Prolene. Just prior to completion everything was flushed thoroughly and the clamps were released.  The foot was immediately pink and everything was hemostatic.     I gave an additional 5000 units of heparin and a cutdown was performed via an oblique incision in the right groin.  The was staining of the tissues and some oozing which made dissection more tedious but I was able to dissect the common femoral artery circumferentially above the sheath and a vessel loop placed around it.  I then dissected out the distal common femoral and a loop was placed around this.  The artery was controlled proximally with a Henley clamp and distally with loops and the sheath removed.  The artery was then repaired using interrupted 6 0 Prolene. Just prior to completion everything was flushed thouroughly and the clamps were relaeased.  The foot was immediately pink and everything was hemostatic.    The heparin was reversed with 50 mg of protamine at the end of the case. Both groins were closed in multiple layers using running 2 0, 3 0, and 4 0 Vicryl sutures.  Each groin site was then closed with a running 4-0 Vicryl subcuticular stitch.  The patient tolerated procedure well and there were no complications. Instrument sponge and needle counts correct in the case. Patient was awakened in the operating room extubated and taken to the recovery room in stable condition.  Fabienne Bruns, MD [facility] [place] Office: [phone] Pager: [phone]

## 2011-09-06 NOTE — Anesthesia Preprocedure Evaluation (Addendum)
Anesthesia Evaluation  Patient identified by MRN, date of birth, ID band Patient awake    Reviewed: Allergy & Precautions, H&P , NPO status , Patient's Chart, lab work & pertinent test results, reviewed documented beta blocker date and time   Airway Mallampati: I TM Distance: >3 FB Neck ROM: Full    Dental  (+) Teeth Intact   Pulmonary  breath sounds clear to auscultation        Cardiovascular hypertension, Pt. on medications - angina+ CAD and + Peripheral Vascular Disease Rhythm:regular Rate:Normal  AAA   Neuro/Psych Depression    GI/Hepatic   Endo/Other  Morbid obesity  Renal/GU      Musculoskeletal   Abdominal   Peds  Hematology   Anesthesia Other Findings   Reproductive/Obstetrics                          Anesthesia Physical Anesthesia Plan  ASA: III  Anesthesia Plan: General   Post-op Pain Management:    Induction: Intravenous  Airway Management Planned: Oral ETT  Additional Equipment: Arterial line and PA Cath  Intra-op Plan:   Post-operative Plan: Extubation in OR  Informed Consent: I have reviewed the patients History and Physical, chart, labs and discussed the procedure including the risks, benefits and alternatives for the proposed anesthesia with the patient or authorized representative who has indicated his/her understanding and acceptance.   Dental advisory given  Plan Discussed with: CRNA, Anesthesiologist and Surgeon  Anesthesia Plan Comments:         Anesthesia Quick Evaluation

## 2011-09-06 NOTE — Plan of Care (Signed)
Problem: Consults Goal: Diagnosis CEA/CES/AAA Stent AAA stent repair

## 2011-09-06 NOTE — Anesthesia Postprocedure Evaluation (Signed)
  Anesthesia Post-op Note  Patient: Alfred [name]  Procedure(s) Performed: Procedure(s) (LRB): ABDOMINAL AORTIC ENDOVASCULAR STENT GRAFT (N/A) ARTERY REPAIR (Bilateral)  Patient Location: PACU  Anesthesia Type: General  Level of Consciousness: awake  Airway and Oxygen Therapy: Patient Spontanous Breathing and Patient connected to nasal cannula oxygen  Post-op Pain: mild  Post-op Assessment: Post-op Vital signs reviewed, Patient's Cardiovascular Status Stable, Respiratory Function Stable, Patent Airway and No signs of Nausea or vomiting  Post-op Vital Signs: Reviewed and stable  Complications: No apparent anesthesia complications

## 2011-09-06 NOTE — Interval H&P Note (Signed)
History and Physical Interval Note:  09/06/2011 7:33 AM  Alfred [name]  has presented today for surgery, with the diagnosis of Abdominal Aortic Aneurysm  The various methods of treatment have been discussed with the patient and family. After consideration of risks, benefits and other options for treatment, the patient has consented to  Procedure(s) (LRB): ABDOMINAL AORTIC ENDOVASCULAR STENT GRAFT (N/A) as a surgical intervention .  The patients' history has been reviewed, patient examined, no change in status, stable for surgery.  I have reviewed the patients' chart and labs.  Questions were answered to the patient's satisfaction.     Shakeira Rhee E

## 2011-09-06 NOTE — Progress Notes (Signed)
09/06/2011 2000  Unable to doppler left dorsalis pedis pulse. Dr. Darrick Penna notified. No new orders received. Will continue to monitor.  [place] Syriah Delisi, RN

## 2011-09-07 ENCOUNTER — Encounter (HOSPITAL_COMMUNITY): Payer: Self-pay | Admitting: Vascular Surgery

## 2011-09-07 ENCOUNTER — Other Ambulatory Visit: Payer: Self-pay | Admitting: Thoracic Diseases

## 2011-09-07 DIAGNOSIS — I714 Abdominal aortic aneurysm, without rupture, unspecified: Secondary | ICD-10-CM

## 2011-09-07 LAB — CBC
Hemoglobin: 10.2 g/dL — ABNORMAL LOW (ref 13.0–17.0)
MCH: 34.3 pg — ABNORMAL HIGH (ref 26.0–34.0)
MCHC: 34.7 g/dL (ref 30.0–36.0)
MCV: 99 fL (ref 78.0–100.0)

## 2011-09-07 LAB — BASIC METABOLIC PANEL
BUN: 13 mg/dL (ref 6–23)
CO2: 25 mEq/L (ref 19–32)
Calcium: 8.1 mg/dL — ABNORMAL LOW (ref 8.4–10.5)
Creatinine, Ser: 0.75 mg/dL (ref 0.50–1.35)
GFR calc non Af Amer: >90 mL/min (ref >90)
Glucose, Bld: 134 mg/dL — ABNORMAL HIGH (ref 70–99)
Sodium: 135 mEq/L (ref 135–145)

## 2011-09-07 MED ORDER — SODIUM CHLORIDE 0.9 % IJ SOLN
INTRAMUSCULAR | Status: AC
  Administered 2011-09-07: 10 mL
  Filled 2011-09-07: qty 20

## 2011-09-07 MED ORDER — OXYCODONE HCL 5 MG PO TABS: 5.0000 mg | ORAL_TABLET | ORAL | Status: AC | PRN

## 2011-09-07 MED ORDER — SODIUM CHLORIDE 0.9 % IJ SOLN
INTRAMUSCULAR | Status: AC
  Administered 2011-09-07: 10 mL
  Filled 2011-09-07: qty 10

## 2011-09-07 NOTE — Progress Notes (Signed)
09/07/2011 0950  Discharge instructions reviewed with patient and patient's niece.  Both stated understanding.   IV discontinued without complications. Patient ready for discharge.  Jaclyn Shaggy Everhart

## 2011-09-07 NOTE — Discharge Summary (Signed)
[facility] Discharge Summary   Patient ID:  Alfred [name] MRN: [id number] DOB/AGE: 12/09/1950 61 y.o.  Admit date: 09/06/2011 Discharge date: 09/07/2011 Date of Surgery: 09/06/2011 Surgeon: Surgeon(s): Sherren Kerns, MD  Admission Diagnosis: Abdominal Aortic Aneurysm  Discharge Diagnoses:  Abdominal Aortic Aneurysm  Secondary Diagnoses: Past Medical History  Diagnosis Date  . AAA (abdominal aortic aneurysm)   . Dyslipidemia   . Left lumbar radiculopathy   . Hyperplasia of prostate without lower urinary tract symptoms (LUTS)   . Other and unspecified hyperlipidemia   . Hx of adenomatous colonic polyps 2008  . Depression   . HTN (hypertension)     unspecified essential  . CAD (coronary artery disease)     DES to RCA and CFX; Dr. Excell Seltzer cardiologist    Procedure(s): ABDOMINAL AORTIC ENDOVASCULAR STENT GRAFT ARTERY REPAIR  Discharged Condition: good  HPI:  Patient is a 61 y.o. year old male who presents for evaluation of abdominal aortic aneurysm. The aneurysm is currently 5.6 cm in diameter by CT performed today. The patient denies abdominal pain. The patient denies back pain. The patient hahas family history of AAA in a grand parent. The aneurysm has been slowly growing over time under surveillance ultrasound by Dr. Excell Seltzer. Other medical problems include coronary artery disease status post stenting, hyperlipidemia, hypertension, tobacco abuse in the form of snuff. These problems are all currently stable. I did discuss with the patient potentially quitting his tobacco use and he is not interested at this time Pt scheduled for AAA  Repair with stent graft.  Hospital Course:  Alfred [name] is a 61 y.o. male is S/P  Procedure(s): ABDOMINAL AORTIC ENDOVASCULAR STENT GRAFT ARTERY REPAIR Extubated: POD # 0 Post-op wounds healing well Pt. Ambulating, voiding and taking PO diet without difficulty. Pt pain controlled with PO pain meds. Labs as  below Complications:attempted perclose devices converted to open sec to bleeding     Significant Diagnostic Studies: CBC Lab Results  Component Value Date   WBC 5.6 09/07/2011   HGB 10.2* 09/07/2011   HCT 29.4* 09/07/2011   MCV 99.0 09/07/2011   PLT 128* 09/07/2011    BMET    Component Value Date/Time   NA 135 09/07/2011 0420   K 3.6 09/07/2011 0420   CL 100 09/07/2011 0420   CO2 25 09/07/2011 0420   GLUCOSE 134* 09/07/2011 0420   GLUCOSE 96 05/26/2006 1118   BUN 13 09/07/2011 0420   CREATININE 0.75 09/07/2011 0420   CALCIUM 8.1* 09/07/2011 0420   GFRNONAA >90 09/07/2011 0420   GFRAA >90 09/07/2011 0420   COAG Lab Results  Component Value Date   INR 1.50* 09/06/2011   INR 1.00 08/31/2011     Disposition:  Discharge to :Home Discharge Orders    Future Orders Please Complete By Expires   Resume previous diet      Driving Restrictions      Comments:   No driving for 4 weeks   Lifting restrictions      Comments:   No lifting for 6 weeks   Call MD for:  temperature >100.5      Call MD for:  redness, tenderness, or signs of infection (pain, swelling, bleeding, redness, odor or green/yellow discharge around incision site)      Call MD for:  severe or increased pain, loss or decreased feeling  in affected limb(s)      Increase activity slowly      Comments:   Walk with assistance use  walker or cane as needed   May shower       Scheduling Instructions:   Wednesday    may wash over wound with mild soap and water      No dressing needed      ABDOMINAL PROCEDURE/ANEURYSM REPAIR/AORTO-BIFEMORAL BYPASS:  Call MD for increased abdominal pain; cramping diarrhea; nausea/vomiting      Discharge wound care:      Comments:   Dry wounds well after shower      Koki, [place]  Home Medication Instructions [id number]   Printed on:09/07/11 1028  Medication Information                    aspirin 81 MG tablet Take 81 mg by mouth daily.             Multiple Vitamin (MULTIVITAMIN) capsule Take 1  capsule by mouth daily.            fish oil-omega-3 fatty acids 1000 MG capsule Take 1 g by mouth daily.           clopidogrel (PLAVIX) 75 MG tablet Take 75 mg by mouth daily.           metoprolol succinate (TOPROL-XL) 100 MG 24 hr tablet Take 100 mg by mouth daily. Take with or immediately following a meal.           losartan-hydrochlorothiazide (HYZAAR) 100-25 MG per tablet Take 1 tablet by mouth daily.           atorvastatin (LIPITOR) 10 MG tablet Take 10 mg by mouth daily.           oxyCODONE (OXY IR/ROXICODONE) 5 MG immediate release tablet Take 1-2 tablets (5-10 mg total) by mouth every 4 (four) hours as needed.            Verbal and written Discharge instructions given to the patient. Wound care per Discharge AVS   Signed: Marlowe Shores 09/07/2011, 10:28 AM

## 2011-09-07 NOTE — Progress Notes (Addendum)
[facility] [place]  Post-op EVAR Date of Surgery: 09/06/2011 Surgeon: Surgeon(s): Sherren Kerns, MD POD: 1 Day Post-Op Device: [phone] cm main body Gore Excluder device delivered via a right femoral system  #3 18 x 13.5 cm left iliac limb contralateral  #4 16 x 9.5 ipsilateral iliac extension right  Endoleak: No:   History of Present Illness  Alfred [name] is a 61 y.o. male who is s/p EVAR. The patient denies back pain; denies abdominal pain; denies lower extremity pain.  He is Ambulating and taking PO well without nausea or vomiting. Pt. has not yet voided with foley out  IMAGING: Dg Chest Portable 1 View  09/06/2011  *RADIOLOGY REPORT*  Clinical Data: Line placement.  PORTABLE CHEST - 1 VIEW  Comparison: 08/31/2011 radiographs.  Findings: 1345 hours.  Right IJ sheath tip overlies the right lung apex, near the confluence of the internal jugular and subclavian veins.  There are low lung volumes with increased bibasilar atelectasis.  No confluent airspace opacity, pneumothorax or significant pleural effusion is seen.  Heart size and mediastinal contours are stable.  IMPRESSION: Right IJ sheath near the confluence of the IJ and subclavian veins. No pneumothorax.  Original Report Authenticated By: Gerrianne Scale, M.D.   Dg Abd 2 Views  09/06/2011  *RADIOLOGY REPORT*  Clinical Data: Abdominal aortic aneurysm, endovascular stent graft repair  C-ARM 61-120 MIN,ABDOMEN - 2 VIEW  Comparison: 08/19/2011  Findings: Nine spot fluoroscopic intraoperative views demonstrate endovascular stent graft repair of the infrarenal abdominal aortic aneurysm.  Stent graft extends from below the renal arteries into the common iliac arteries bilaterally.  Stent graft lumen is patent.  No gross Endo leak demonstrated by limited intraoperative views.  IMPRESSION: Intraoperative stent graft repair of the infrarenal abdominal aortic aneurysm.  Original Report Authenticated By: Judie Petit. Ruel Favors,  M.D.   Dg Abd Portable 1v  09/06/2011  *RADIOLOGY REPORT*  Clinical Data: Aortic stent graft placement  PORTABLE ABDOMEN - 1 VIEW  Comparison: 08/19/2011  Findings: Bifurcated aortic stent graft projects in expected location.  There is residual contrast material in the nondistended renal collecting systems and urinary bladder.  Foley catheter in the bladder.  Normal bowel gas pattern.  Minimal spondylitic changes in the lumbar spine.  IMPRESSION:  1.  Aortic stent graft placement without apparent complication.  Original Report Authenticated By: Osa Craver, M.D.   Dg C-arm [phone] Min  09/06/2011  *RADIOLOGY REPORT*  Clinical Data: Abdominal aortic aneurysm, endovascular stent graft repair  C-ARM 61-120 MIN,ABDOMEN - 2 VIEW  Comparison: 08/19/2011  Findings: Nine spot fluoroscopic intraoperative views demonstrate endovascular stent graft repair of the infrarenal abdominal aortic aneurysm.  Stent graft extends from below the renal arteries into the common iliac arteries bilaterally.  Stent graft lumen is patent.  No gross Endo leak demonstrated by limited intraoperative views.  IMPRESSION: Intraoperative stent graft repair of the infrarenal abdominal aortic aneurysm.  Original Report Authenticated By: Judie Petit. Ruel Favors, M.D.    Significant Diagnostic Studies: CBC Lab Results  Component Value Date   WBC 5.6 09/07/2011   HGB 10.2* 09/07/2011   HCT 29.4* 09/07/2011   MCV 99.0 09/07/2011   PLT 128* 09/07/2011     BMET    Component Value Date/Time   NA 135 09/07/2011 0420   K 3.6 09/07/2011 0420   CL 100 09/07/2011 0420   CO2 25 09/07/2011 0420   GLUCOSE 134* 09/07/2011 0420   GLUCOSE 96 05/26/2006 1118   BUN  13 09/07/2011 0420   CREATININE 0.75 09/07/2011 0420   CALCIUM 8.1* 09/07/2011 0420   GFRNONAA >90 09/07/2011 0420   GFRAA >90 09/07/2011 0420    COAG Lab Results  Component Value Date   INR 1.50* 09/06/2011   INR 1.00 08/31/2011   No results found for this basename: PTT     I/O last 3 completed  shifts: In: 3835 [P.O.:240; I.V.:3595] Out: 1965 [Urine:1315; Blood:650] No data found.   Physical Examination  BP Readings from Last 3 Encounters:  09/07/11 112/57  09/07/11 112/57  08/31/11 116/74   Temp Readings from Last 3 Encounters:  09/07/11 98.1 F (36.7 C) Oral  09/07/11 98.1 F (36.7 C) Oral  08/31/11 97.1 F (36.2 C) Oral   SpO2 Readings from Last 3 Encounters:  09/07/11 98%  09/07/11 98%  08/31/11 95%   Pulse Readings from Last 3 Encounters:  09/07/11 79  09/07/11 79  08/31/11 66    General: A&O x 3, WDWN male in NAD Gait: Normal Pulmonary: normal non-labored breathing  Cardiac: RRR Abdomen: soft, NT, NABS Bilateral groin wounds: clean, dry, intact, without hematoma Extremities without ischemic changes, no Gangrene , no cellulitis; no open wounds;   Neurologic: A&O X 3; Appropriate Affect  Assessment: Alfred [name] is a 61 y.o. male who is 1 Day Post-Op EVAR.  Pt is doing well with no complaints Bilateral groin wounds without hematoma  Plan: Home after voids, ambulates and takes PO  The importance of surveillance of the endograft was discussed with the patient  A CTA of abdomen and pelvis will be scheduled for one month to assess for endoleak.  The patient will follow up with [place] in one month with these studies.   SignedMarlowe Shores [phone] 09/07/2011 7:47 AM.   Agree with above Hopefully d/c later today  Fabienne Bruns, MD [facility] [place] Office: [phone] Pager: [phone]

## 2011-09-08 LAB — TYPE AND SCREEN
ABO/RH(D): O POS
Antibody Screen: NEGATIVE
Unit division: 0

## 2011-09-15 ENCOUNTER — Encounter: Payer: Commercial Indemnity | Admitting: Vascular Surgery

## 2011-10-06 ENCOUNTER — Encounter: Payer: Self-pay | Admitting: Vascular Surgery

## 2011-10-07 ENCOUNTER — Ambulatory Visit
Admission: RE | Admit: 2011-10-07 | Discharge: 2011-10-07 | Disposition: A | Discharge status: Home / Self Care | Payer: Managed Care, Other (non HMO) | Source: Ambulatory Visit | Attending: Thoracic Diseases | Admitting: Thoracic Diseases

## 2011-10-07 ENCOUNTER — Encounter: Payer: Self-pay | Admitting: Vascular Surgery

## 2011-10-07 ENCOUNTER — Ambulatory Visit (INDEPENDENT_AMBULATORY_CARE_PROVIDER_SITE_OTHER): Payer: Managed Care, Other (non HMO) | Admitting: Vascular Surgery

## 2011-10-07 VITALS — BP 116/67 | HR 75 | Temp 98.1°F | Ht 72.0 in | Wt 272.0 lb

## 2011-10-07 DIAGNOSIS — I714 Abdominal aortic aneurysm, without rupture, unspecified: Secondary | ICD-10-CM

## 2011-10-07 DIAGNOSIS — Z48812 Encounter for surgical aftercare following surgery on the circulatory system: Secondary | ICD-10-CM

## 2011-10-07 MED ORDER — IOHEXOL 350 MG/ML SOLN
100.0000 mL | Freq: Once | INTRAVENOUS | Status: AC | PRN
  Administered 2011-10-07: 100 mL via INTRAVENOUS

## 2011-10-07 NOTE — Progress Notes (Signed)
[facility] [place] HISTORY AND PHYSICAL    History of Present Illness:  Patient is a 61 y.o.61 y.o. year old male who presents for follow-up evaluation of AAA. He underwent Gore Excluder aneurysm stent graft repair on 09/16/2011.  He had bilateral femoral cutdowns. He also required a patch angioplasty of his left common femoral artery. He reports overall he is feeling well. He does still has some right groin soreness. The patient denies new abdominal or back pain.  The patient's atherosclerotic risk factors remain hyperlipidemia, hypertension, and coronary artery disease.  These are all currently stable and followed by his primary care physician.   He denies claudication symptoms  Past Medical History  Diagnosis Date  . AAA (abdominal aortic aneurysm)   . Dyslipidemia   . Left lumbar radiculopathy   . Hyperplasia of prostate without lower urinary tract symptoms (LUTS)   . Other and unspecified hyperlipidemia   . Hx of adenomatous colonic polyps 2008  . Depression   . HTN (hypertension)     unspecified essential  . CAD (coronary artery disease)     DES to RCA and CFX; Dr. Excell Seltzer cardiologist     Past Surgical History  Procedure Date  . Cardiac catheterization 2005  . Appendectomy     age 81  . Colonoscopy w/ polypectomy 2008    adenomatous  & hyperplastic polyp;due 2013  . Cystoscopy 2005    varicose veins in bladder, Dr Retta Diones  . Coronary artey stent 2002 & 2005  . Tonsillectomy and adenoidectomy     removed at age 47  . Artery repair 09/06/2011    Procedure: ARTERY REPAIR;  Surgeon: Sherren Kerns, MD;  Location: [facility] OR;  Service: Vascular;  Laterality: Bilateral;       Review of Systems:  Neurologic: denies symptoms of TIA, amaurosis, or stroke Cardiac:denies shortness of breath or chest pain Pulmonary: denies cough or wheeze Abdomen: denies abdominal pain nausea or vomiting  History   Social History  . Marital Status: Married    Spouse Name:  N/A    Number of Children: N/A  . Years of Education: N/A   Occupational History  . Farming    Social History Main Topics  . Smoking status: Former Smoker -- 1.0 packs/day for 27 years    Quit date: 05/31/1993  . Smokeless tobacco: Current User    Types: Snuff  . Alcohol Use: 12.6 oz/week    21 Shots of liquor per week     2-3 drinks of Vodka a night, occasional beer  . Drug Use: No  . Sexually Active: Not on file   Other Topics Concern  . Not on file   Social History Narrative  . No narrative on file    No Known Allergies  Current Outpatient Prescriptions on File Prior to Visit  Medication Sig Dispense Refill  . aspirin 81 MG tablet Take 81 mg by mouth daily.        Marland Kitchen atorvastatin (LIPITOR) 10 MG tablet Take 10 mg by mouth daily.      . clopidogrel (PLAVIX) 75 MG tablet Take 75 mg by mouth daily.      . fish oil-omega-3 fatty acids 1000 MG capsule Take 1 g by mouth daily.      Marland Kitchen losartan-hydrochlorothiazide (HYZAAR) 100-25 MG per tablet Take 1 tablet by mouth daily.      . metoprolol succinate (TOPROL-XL) 100 MG 24 hr tablet Take 100 mg by mouth daily. Take with or immediately following  a meal.      . Multiple Vitamin (MULTIVITAMIN) capsule Take 1 capsule by mouth daily.        No current facility-administered medications on file prior to visit.       Physical Examination    Filed Vitals:   10/07/11 1118  BP: 116/67  Pulse: 75  Temp: 98.1 F (36.7 C)  TempSrc: Oral  Height: 6' (1.829 m)  Weight: 272 lb (123.378 kg)     General:  Alert and oriented, no acute distress HEENT: Normal Neck: No bruit or JVD Pulmonary: Clear to auscultation bilaterally Cardiac: Regular Rate and Rhythm without murmur Abdomen: Soft, non-tender, non-distended, normal bowel sounds, no pulsatile mass Extremities: 2+ femoral pulses, 2+ right dorsalis pedis pulse absent left dorsalis pedis pulse feet pink warm well perfused    DATA:  CT angiogram the abdomen and pelvis images were  reviewed today. Current aneurysm diameter is  5.6-5.7cm.  There is no evidence of endoleak. The top portion of the stent graft is adjacent to the renal arteries and there is no evidence of migration.  There is a small seroma in the right groin   ASSESSMENT:  Doing well status post Gore Excluder stent graft repair aneurysm   PLAN: Patient will return in 6 months to review his stent graft.   Fabienne Bruns, MD [facility] [place] Office: [phone] Pager: [phone]

## 2011-10-07 NOTE — Progress Notes (Signed)
Addended by: Sharee Pimple on: 10/07/2011 02:56 PM   Modules accepted: Orders

## 2011-11-22 ENCOUNTER — Telehealth: Payer: Self-pay | Admitting: Cardiovascular Disease

## 2011-11-22 NOTE — Telephone Encounter (Signed)
I reccommended that Alfred [name] go to Urgent Care for his possible infection and to tell them his history and that if they have any questions to call.

## 2011-11-22 NOTE — Telephone Encounter (Signed)
Pt thinks he has a kidney infection, going to an er or urgent care to have it checked and wants to know due to his medical  History does he need to tell them anything? pls call [phone]

## 2011-11-22 NOTE — Telephone Encounter (Signed)
N/A.  LMTC on voicemail. 

## 2011-11-22 NOTE — Telephone Encounter (Signed)
xfer to [employer], per Debby direction

## 2011-12-21 ENCOUNTER — Encounter: Payer: Self-pay | Admitting: Internal Medicine

## 2011-12-21 ENCOUNTER — Ambulatory Visit (INDEPENDENT_AMBULATORY_CARE_PROVIDER_SITE_OTHER): Payer: Managed Care, Other (non HMO) | Admitting: Internal Medicine

## 2011-12-21 VITALS — BP 110/80 | HR 78 | Temp 98.3°F | Wt 256.8 lb

## 2011-12-21 DIAGNOSIS — I714 Abdominal aortic aneurysm, without rupture, unspecified: Secondary | ICD-10-CM

## 2011-12-21 DIAGNOSIS — K5730 Diverticulosis of large intestine without perforation or abscess without bleeding: Secondary | ICD-10-CM

## 2011-12-21 DIAGNOSIS — R109 Unspecified abdominal pain: Secondary | ICD-10-CM

## 2011-12-21 DIAGNOSIS — K5790 Diverticulosis of intestine, part unspecified, without perforation or abscess without bleeding: Secondary | ICD-10-CM

## 2011-12-21 DIAGNOSIS — R509 Fever, unspecified: Secondary | ICD-10-CM

## 2011-12-21 LAB — CBC WITH DIFFERENTIAL/PLATELET
Basophils Absolute: 0 10*3/uL (ref 0.0–0.1)
Basophils Relative: 0.2 % (ref 0.0–3.0)
Eosinophils Absolute: 0.1 10*3/uL (ref 0.0–0.7)
MCHC: 33.5 g/dL (ref 30.0–36.0)
MCV: 96.4 fl (ref 78.0–100.0)
Monocytes Absolute: 0.7 10*3/uL (ref 0.1–1.0)
Neutrophils Relative %: 72.2 % (ref 43.0–77.0)
RBC: 4.09 Mil/uL — ABNORMAL LOW (ref 4.22–5.81)
RDW: 12.4 % (ref 11.5–14.6)

## 2011-12-21 LAB — BASIC METABOLIC PANEL
BUN: 9 mg/dL (ref 6–23)
Creatinine, Ser: 0.8 mg/dL (ref 0.4–1.5)
GFR: 104.54 mL/min (ref >60.00)
Potassium: 3.1 mEq/L — ABNORMAL LOW (ref 3.5–5.1)

## 2011-12-21 LAB — POCT URINALYSIS DIPSTICK
Ketones, UA: NEGATIVE
Leukocytes, UA: NEGATIVE
Nitrite, UA: NEGATIVE
Protein, UA: NEGATIVE
Urobilinogen, UA: 0.2
pH, UA: 6.5

## 2011-12-21 MED ORDER — TRAMADOL HCL 50 MG PO TABS: 50.0000 mg | ORAL_TABLET | Freq: Four times a day (QID) | ORAL | Status: DC | PRN

## 2011-12-21 NOTE — Patient Instructions (Addendum)
Until symptoms resolve  stay on a low residue diet and avoid items such as popcorn, peanuts, fruits with seeds or other  Please try to go on My Chart within the next 24 hours to allow me to release the results directly to you.

## 2011-12-21 NOTE — Progress Notes (Signed)
  Subjective:    Patient ID: Alfred [name], male    DOB: 04-Oct-1950, 61 y.o.   MRN: [id number]  HPI He was awakened 11/29/11 with "gas pain" in the suprapubic area. This pain can be up to level 6 on a 10 scale. Position seems to cause it to radiate to the left hip, back, or testicle. It has been intermittent since. He actually began to have some sensitivity in the lower abdomen following his vascular surgery 09/06/11 in the area of the "femoral split". CAT scan in May had shown a localized fluid pocket (seroma). This stent in the aortic aneurysm was noted to be in good position with no endovascular leak. Severe fatty infiltration the liver was noted. That study did show a few scattered rectosigmoid colonic diverticula. The vascular  surgery  required that he delay followup colonoscopy. In 2008 he been found to have adenomatous polyps.  He was seen @ an urgent care 2 weeks ago; no treatment was initiated. At that time his red cells "looked a little large"    Review of Systems Nausea/Vomiting: no Diarrhea: no  Constipation: no Melena/BRBPR: no Hematemesis: no Anorexia: yes over 2-3 days Fever/Chills:temp to 100.4  Dysuria/hamaturia/pyuria: no Rash: no Wt loss:purposeful, 30-40 # EtOH use: 21/ week NSAIDs/ASA: no        Objective:   Physical Exam General appearance good  nourishment w/o distress but uncomfortable.  Eyes: No conjunctival inflammation or scleral icterus is present.  Oral exam: Dental hygiene is good; lips and gums are healthy appearing.There is minimal oropharyngeal erythema w/o  exudate noted.   Heart:  Normal rate and regular rhythm. S1 and S2 normal without gallop, murmur, click, rub . S 4 w/o extra sounds     Lungs:Chest clear to auscultation; no wheezes, rhonchi,rales ,or rubs present.No increased work of breathing.   Abdomen: bowel sounds normal, soft and non-tender without masses, organomegaly or hernias noted.  No guarding or rebound   No tenderness to  percussion over the flanks  Skin:Warm & dry.  Intact without suspicious lesions or rashes ; no jaundice . Minor tenting  Lymphatic: No lymphadenopathy is noted about the head, neck, axilla, or inguinal areas.   Genital exam unremarkable. Prostate upper limits of normal to minimally enlarged. No nodules, asymmetry, or induration. Scant stool was Hemoccult negative  Musculoskeletal: Some discomfort with range of motion left hip. Negative straight leg raising. Gait normal             Assessment & Plan:  #1 suprapubic pain in the context of AAA stent placement 09/06/11.  #2 fever  #3 history of adenomatous colon polyps & diverticulosis  Plan: See orders and recommendations. Repeat imaging may be needed if labs are nondiagnostic

## 2011-12-22 ENCOUNTER — Telehealth: Payer: Self-pay

## 2011-12-22 ENCOUNTER — Encounter: Payer: Self-pay | Admitting: Internal Medicine

## 2011-12-22 MED ORDER — METRONIDAZOLE 500 MG PO TABS: 500.0000 mg | ORAL_TABLET | Freq: Three times a day (TID) | ORAL | Status: DC

## 2011-12-22 MED ORDER — CIPROFLOXACIN HCL 500 MG PO TABS: 500.0000 mg | ORAL_TABLET | Freq: Two times a day (BID) | ORAL | Status: DC

## 2011-12-22 MED ORDER — HYDROCODONE-ACETAMINOPHEN 5-325 MG PO TABS: 1.0000 | ORAL_TABLET | ORAL | Status: DC | PRN

## 2011-12-22 NOTE — Telephone Encounter (Signed)
RX's sent to pharmacy 

## 2011-12-22 NOTE — Telephone Encounter (Signed)
Message copied by Maurice Small on Wed Dec 22, 2011  4:22 PM ------      Message from: Pecola Lawless      Created: Wed Dec 22, 2011  2:39 PM       Ciprofloxacin 500 mg 1 twice a day dispense 14; metronidazole 500 mg 1 3 times a day dispense 21. Generic Vicodin one every 4 hours as needed for pain dispense 30

## 2011-12-24 ENCOUNTER — Encounter: Payer: Self-pay | Admitting: Internal Medicine

## 2011-12-24 ENCOUNTER — Ambulatory Visit
Admission: RE | Admit: 2011-12-24 | Discharge: 2011-12-24 | Disposition: A | Discharge status: Home / Self Care | Payer: Managed Care, Other (non HMO) | Source: Ambulatory Visit | Attending: Internal Medicine | Admitting: Internal Medicine

## 2011-12-24 ENCOUNTER — Telehealth: Payer: Self-pay | Admitting: Internal Medicine

## 2011-12-24 ENCOUNTER — Encounter (HOSPITAL_COMMUNITY): Payer: Self-pay | Admitting: Emergency Medicine

## 2011-12-24 ENCOUNTER — Inpatient Hospital Stay (HOSPITAL_COMMUNITY)
Admission: EM | Admit: 2011-12-24 | Discharge: 2011-12-29 | DRG: 316 | Disposition: A | Discharge status: Home Health | Payer: Managed Care, Other (non HMO) | Attending: Vascular Surgery | Admitting: Vascular Surgery

## 2011-12-24 ENCOUNTER — Ambulatory Visit (INDEPENDENT_AMBULATORY_CARE_PROVIDER_SITE_OTHER): Payer: Managed Care, Other (non HMO) | Admitting: Internal Medicine

## 2011-12-24 VITALS — BP 124/82 | HR 95 | Temp 98.1°F | Wt 256.0 lb

## 2011-12-24 DIAGNOSIS — R109 Unspecified abdominal pain: Secondary | ICD-10-CM

## 2011-12-24 DIAGNOSIS — R1031 Right lower quadrant pain: Secondary | ICD-10-CM

## 2011-12-24 DIAGNOSIS — Z860100 Personal history of colon polyps, unspecified: Secondary | ICD-10-CM

## 2011-12-24 DIAGNOSIS — R1032 Left lower quadrant pain: Secondary | ICD-10-CM

## 2011-12-24 DIAGNOSIS — Z7902 Long term (current) use of antithrombotics/antiplatelets: Secondary | ICD-10-CM

## 2011-12-24 DIAGNOSIS — T827XXA Infection and inflammatory reaction due to other cardiac and vascular devices, implants and grafts, initial encounter: Principal | ICD-10-CM | POA: Diagnosis present

## 2011-12-24 DIAGNOSIS — Y832 Surgical operation with anastomosis, bypass or graft as the cause of abnormal reaction of the patient, or of later complication, without mention of misadventure at the time of the procedure: Secondary | ICD-10-CM | POA: Diagnosis present

## 2011-12-24 DIAGNOSIS — R509 Fever, unspecified: Secondary | ICD-10-CM

## 2011-12-24 DIAGNOSIS — F3289 Other specified depressive episodes: Secondary | ICD-10-CM | POA: Diagnosis present

## 2011-12-24 DIAGNOSIS — Z87891 Personal history of nicotine dependence: Secondary | ICD-10-CM

## 2011-12-24 DIAGNOSIS — Z7982 Long term (current) use of aspirin: Secondary | ICD-10-CM

## 2011-12-24 DIAGNOSIS — Z79899 Other long term (current) drug therapy: Secondary | ICD-10-CM

## 2011-12-24 DIAGNOSIS — E785 Hyperlipidemia, unspecified: Secondary | ICD-10-CM | POA: Diagnosis present

## 2011-12-24 DIAGNOSIS — I10 Essential (primary) hypertension: Secondary | ICD-10-CM | POA: Diagnosis present

## 2011-12-24 DIAGNOSIS — F329 Major depressive disorder, single episode, unspecified: Secondary | ICD-10-CM | POA: Diagnosis present

## 2011-12-24 DIAGNOSIS — E876 Hypokalemia: Secondary | ICD-10-CM | POA: Diagnosis present

## 2011-12-24 DIAGNOSIS — I2510 Atherosclerotic heart disease of native coronary artery without angina pectoris: Secondary | ICD-10-CM | POA: Diagnosis present

## 2011-12-24 LAB — URINALYSIS, ROUTINE W REFLEX MICROSCOPIC
Bilirubin Urine: NEGATIVE
Glucose, UA: NEGATIVE mg/dL
Ketones, ur: 15 mg/dL — AB
Leukocytes, UA: NEGATIVE
pH: 6.5 (ref 5.0–8.0)

## 2011-12-24 LAB — COMPREHENSIVE METABOLIC PANEL
ALT: 20 U/L (ref 0–53)
AST: 24 U/L (ref 0–37)
CO2: 29 mEq/L (ref 19–32)
Chloride: 89 mEq/L — ABNORMAL LOW (ref 96–112)
GFR calc non Af Amer: >90 mL/min (ref >90)
Sodium: 131 mEq/L — ABNORMAL LOW (ref 135–145)
Total Bilirubin: 0.6 mg/dL (ref 0.3–1.2)

## 2011-12-24 LAB — CBC WITH DIFFERENTIAL/PLATELET
Basophils Absolute: 0 10*3/uL (ref 0.0–0.1)
HCT: 36.8 % — ABNORMAL LOW (ref 39.0–52.0)
Lymphocytes Relative: 14 % (ref 12–46)
Neutro Abs: 6.7 10*3/uL (ref 1.7–7.7)
Platelets: 304 10*3/uL (ref 150–400)
RDW: 12.2 % (ref 11.5–15.5)
WBC: 8.6 10*3/uL (ref 4.0–10.5)

## 2011-12-24 MED ORDER — SODIUM CHLORIDE 0.9 % IJ SOLN: 3.0000 mL | INTRAMUSCULAR | Status: DC | PRN

## 2011-12-24 MED ORDER — CIPROFLOXACIN HCL 500 MG PO TABS
500.0000 mg | ORAL_TABLET | Freq: Two times a day (BID) | ORAL | Status: DC
  Filled 2011-12-24: qty 1

## 2011-12-24 MED ORDER — POTASSIUM CHLORIDE CRYS ER 20 MEQ PO TBCR
20.0000 meq | EXTENDED_RELEASE_TABLET | Freq: Once | ORAL | Status: AC
  Administered 2011-12-24: 40 meq via ORAL
  Filled 2011-12-24: qty 2

## 2011-12-24 MED ORDER — ACETAMINOPHEN 500 MG PO TABS
1000.0000 mg | ORAL_TABLET | ORAL | Status: DC | PRN
  Filled 2011-12-24: qty 2

## 2011-12-24 MED ORDER — METRONIDAZOLE 500 MG PO TABS
500.0000 mg | ORAL_TABLET | Freq: Once | ORAL | Status: AC
  Administered 2011-12-24: 500 mg via ORAL
  Filled 2011-12-24: qty 1

## 2011-12-24 MED ORDER — TRAMADOL HCL 50 MG PO TABS
50.0000 mg | ORAL_TABLET | Freq: Four times a day (QID) | ORAL | Status: DC | PRN
  Administered 2011-12-25 – 2011-12-28 (×7): 50 mg via ORAL
  Filled 2011-12-24 (×8): qty 1

## 2011-12-24 MED ORDER — MORPHINE SULFATE 2 MG/ML IJ SOLN: 2.0000 mg | INTRAMUSCULAR | Status: DC | PRN

## 2011-12-24 MED ORDER — GUAIFENESIN-DM 100-10 MG/5ML PO SYRP
15.0000 mL | ORAL_SOLUTION | ORAL | Status: DC | PRN
Start: 2011-12-24 — End: 2011-12-29

## 2011-12-24 MED ORDER — ADULT MULTIVITAMIN W/MINERALS CH
1.0000 | ORAL_TABLET | Freq: Every morning | ORAL | Status: DC
  Administered 2011-12-25 – 2011-12-29 (×5): 1 via ORAL
  Filled 2011-12-24 (×5): qty 1

## 2011-12-24 MED ORDER — METOPROLOL SUCCINATE ER 100 MG PO TB24
100.0000 mg | ORAL_TABLET | Freq: Every day | ORAL | Status: DC
  Administered 2011-12-24 – 2011-12-28 (×5): 100 mg via ORAL
  Filled 2011-12-24 (×6): qty 1

## 2011-12-24 MED ORDER — LABETALOL HCL 5 MG/ML IV SOLN
10.0000 mg | INTRAVENOUS | Status: DC | PRN
  Filled 2011-12-24: qty 4

## 2011-12-24 MED ORDER — IOHEXOL 300 MG/ML  SOLN
125.0000 mL | Freq: Once | INTRAMUSCULAR | Status: AC | PRN
  Administered 2011-12-24: 125 mL via INTRAVENOUS

## 2011-12-24 MED ORDER — CIPROFLOXACIN HCL 500 MG PO TABS
500.0000 mg | ORAL_TABLET | Freq: Once | ORAL | Status: AC
  Administered 2011-12-24: 500 mg via ORAL
  Filled 2011-12-24: qty 1

## 2011-12-24 MED ORDER — CIPROFLOXACIN HCL 500 MG PO TABS
500.0000 mg | ORAL_TABLET | Freq: Two times a day (BID) | ORAL | Status: DC
  Filled 2011-12-24 (×3): qty 1

## 2011-12-24 MED ORDER — METRONIDAZOLE 500 MG PO TABS
500.0000 mg | ORAL_TABLET | Freq: Three times a day (TID) | ORAL | Status: DC
  Filled 2011-12-24: qty 1

## 2011-12-24 MED ORDER — MORPHINE SULFATE 4 MG/ML IJ SOLN
4.0000 mg | Freq: Once | INTRAMUSCULAR | Status: AC
  Administered 2011-12-24: 4 mg via INTRAVENOUS
  Filled 2011-12-24: qty 1

## 2011-12-24 MED ORDER — ATORVASTATIN CALCIUM 10 MG PO TABS
10.0000 mg | ORAL_TABLET | Freq: Every day | ORAL | Status: DC
  Administered 2011-12-24 – 2011-12-28 (×5): 10 mg via ORAL
  Filled 2011-12-24 (×6): qty 1

## 2011-12-24 MED ORDER — HYDROCHLOROTHIAZIDE 25 MG PO TABS
25.0000 mg | ORAL_TABLET | Freq: Every day | ORAL | Status: DC
  Administered 2011-12-25 – 2011-12-29 (×5): 25 mg via ORAL
  Filled 2011-12-24 (×5): qty 1

## 2011-12-24 MED ORDER — ONDANSETRON HCL 4 MG/2ML IJ SOLN: 4.0000 mg | Freq: Four times a day (QID) | INTRAMUSCULAR | Status: DC | PRN

## 2011-12-24 MED ORDER — HYDROCODONE-ACETAMINOPHEN 5-325 MG PO TABS
1.0000 | ORAL_TABLET | ORAL | Status: DC | PRN
  Administered 2011-12-24 – 2011-12-25 (×3): 1 via ORAL
  Filled 2011-12-24 (×5): qty 1

## 2011-12-24 MED ORDER — METRONIDAZOLE 500 MG PO TABS
500.0000 mg | ORAL_TABLET | Freq: Three times a day (TID) | ORAL | Status: DC
  Administered 2011-12-24: 500 mg via ORAL
  Filled 2011-12-24 (×4): qty 1

## 2011-12-24 MED ORDER — PANTOPRAZOLE SODIUM 40 MG PO TBEC
40.0000 mg | DELAYED_RELEASE_TABLET | Freq: Every day | ORAL | Status: DC
  Administered 2011-12-28 – 2011-12-29 (×2): 40 mg via ORAL
  Filled 2011-12-24 (×4): qty 1

## 2011-12-24 MED ORDER — OXYCODONE-ACETAMINOPHEN 5-325 MG PO TABS
1.0000 | ORAL_TABLET | Freq: Once | ORAL | Status: AC
  Administered 2011-12-24: 1 via ORAL
  Filled 2011-12-24: qty 1

## 2011-12-24 MED ORDER — ALUM & MAG HYDROXIDE-SIMETH 200-200-20 MG/5ML PO SUSP: 15.0000 mL | ORAL | Status: DC | PRN

## 2011-12-24 MED ORDER — HYDRALAZINE HCL 20 MG/ML IJ SOLN
10.0000 mg | INTRAMUSCULAR | Status: DC | PRN
  Filled 2011-12-24: qty 0.5

## 2011-12-24 MED ORDER — LOSARTAN POTASSIUM-HCTZ 100-25 MG PO TABS: 1.0000 | ORAL_TABLET | Freq: Every day | ORAL | Status: DC

## 2011-12-24 MED ORDER — METOPROLOL TARTRATE 1 MG/ML IV SOLN: 2.0000 mg | INTRAVENOUS | Status: DC | PRN

## 2011-12-24 MED ORDER — SODIUM CHLORIDE 0.9 % IJ SOLN
3.0000 mL | Freq: Two times a day (BID) | INTRAMUSCULAR | Status: DC
  Administered 2011-12-24 – 2011-12-28 (×7): 3 mL via INTRAVENOUS

## 2011-12-24 MED ORDER — ASPIRIN 81 MG PO CHEW
81.0000 mg | CHEWABLE_TABLET | Freq: Every day | ORAL | Status: DC
  Administered 2011-12-24 – 2011-12-28 (×5): 81 mg via ORAL
  Filled 2011-12-24 (×9): qty 1

## 2011-12-24 MED ORDER — CLOPIDOGREL BISULFATE 75 MG PO TABS
75.0000 mg | ORAL_TABLET | Freq: Every morning | ORAL | Status: DC
  Administered 2011-12-25 – 2011-12-29 (×5): 75 mg via ORAL
  Filled 2011-12-24 (×5): qty 1

## 2011-12-24 MED ORDER — LOSARTAN POTASSIUM 50 MG PO TABS
100.0000 mg | ORAL_TABLET | Freq: Every day | ORAL | Status: DC
  Administered 2011-12-25 – 2011-12-29 (×5): 100 mg via ORAL
  Filled 2011-12-24 (×5): qty 2

## 2011-12-24 MED ORDER — PHENOL 1.4 % MT LIQD
1.0000 | OROMUCOSAL | Status: DC | PRN
  Filled 2011-12-24: qty 177

## 2011-12-24 NOTE — ED Notes (Signed)
Pt c/o increasing pain in his abd

## 2011-12-24 NOTE — ED Notes (Signed)
EKg shown to Dr. Beaton, copies in the chart 

## 2011-12-24 NOTE — ED Notes (Addendum)
The pt was eating soup when i came in.  He was told by the doctor that he could eat.  Pain med given

## 2011-12-24 NOTE — ED Notes (Signed)
The pt was seen by his medical doctor Wednesday and was placed on antibiotics for poss diverticulitis.  He does not feel better.

## 2011-12-24 NOTE — Telephone Encounter (Signed)
Caller: Rooney/Patient; PCP: Marga Melnick; CB#: [phone]; ; ; Call regarding Abdominal Pain and Fever; Cedrik was in the office on 12/21/11 d/t pain in lower abdomen.  Had an aortic aneurysm stent placed in April 2013.  Started Cipro on 12/22/11 d/t diverticulitis.  Pain is worsening.  Temp 99.2 O.  Pain is constant  today.  Denies diarrhea.  Pain radiates to hips and back.  Utilized Abdominal Pain Guideline.  ED disposition.  Scheduled appt in office today at 1130 with Dr. [name].

## 2011-12-24 NOTE — ED Notes (Signed)
The pt had a Lobbyist in April.  He did well until 3 weeks ago when he began to experience lower abd pain and for the past 3 days he has had a temp.Marland Kitchen

## 2011-12-24 NOTE — ED Notes (Signed)
Iv inserted.  Bi-lateral pedal pulses strong.  Both legs normal color and sensation.  No visible swelling

## 2011-12-24 NOTE — H&P (Signed)
The patient presents today status post stent graft repair of infrarenal abdominal aortic aneurysm in April. He had an uneventful course. He did have bilateral groin cutdowns for the procedure. He had a CT scan as planned in one month showing good placement of the stent graft and no evidence of endoleak. This was in mid May. Since that time he has had vague lower abdominal discomfort. He has taken pain meds for this with some relief. He has seen urgent care center thinking this may be urinary tract infection which was negative. He has had a fever on one occasion up to 100.4 but otherwise has been afebrile. Today he saw his medical doctor and was sent for a CT scan to further evaluation. This reveals a very unusual situation with thickening of his aortic wall and potentially some fluid around his proximal aorta below the level of the renal arteries. There is no evidence of leak.  Past Medical History  Diagnosis Date  . AAA (abdominal aortic aneurysm)   . Dyslipidemia   . Left lumbar radiculopathy   . Hyperplasia of prostate without lower urinary tract symptoms (LUTS)   . Other and unspecified hyperlipidemia   . Hx of adenomatous colonic polyps 2008  . Depression   . HTN (hypertension)     unspecified essential  . CAD (coronary artery disease)     DES to RCA and CFX; Dr. Excell Seltzer cardiologist    History  Substance Use Topics  . Smoking status: Former Smoker -- 1.0 packs/day for 27 years    Quit date: 05/31/1993  . Smokeless tobacco: Current User    Types: Snuff  . Alcohol Use: 12.6 oz/week    21 Shots of liquor per week     2-3 drinks of Vodka a night, occasional beer    Family History  Problem Relation Age of Onset  . Coronary artery disease Father     CABG in 29s  . Diabetes Father   . Colon polyps Father   . Heart attack Father     ?  Marland Kitchen Cancer Father     non-small cell carcinoma/bone CA  . Hypertension Father   . Heart disease Father   . Coronary artery disease Maternal  Grandfather   . Coronary artery disease Paternal Grandfather   . Other Mother     varicose veins  . Anesthesia problems Neg Hx     No Known Allergies  Current facility-administered medications:oxyCODONE-acetaminophen (PERCOCET/ROXICET) 5-325 MG per tablet 1 tablet, 1 tablet, Oral, Once, [employer], PA-C, 1 tablet at 12/24/11 1825 Current outpatient prescriptions:acetaminophen (TYLENOL) 500 MG tablet, Take 1,000 mg by mouth every 4 (four) hours as needed. For pain, Disp: , Rfl: ;  aspirin 81 MG tablet, Take 81 mg by mouth at bedtime. , Disp: , Rfl: ;  atorvastatin (LIPITOR) 10 MG tablet, Take 10 mg by mouth at bedtime. , Disp: , Rfl: ;  ciprofloxacin (CIPRO) 500 MG tablet, Take 500 mg by mouth 2 (two) times daily., Disp: , Rfl:  clopidogrel (PLAVIX) 75 MG tablet, Take 75 mg by mouth every morning. , Disp: , Rfl: ;  HYDROcodone-acetaminophen (NORCO/VICODIN) 5-325 MG per tablet, Take 1 tablet by mouth every 4 (four) hours as needed. For paim, Disp: , Rfl: ;  losartan-hydrochlorothiazide (HYZAAR) 100-25 MG per tablet, Take 1 tablet by mouth at bedtime. , Disp: , Rfl:  metoprolol succinate (TOPROL-XL) 100 MG 24 hr tablet, Take 100 mg by mouth at bedtime. Take with or immediately following a meal., Disp: , Rfl: ;  metroNIDAZOLE (FLAGYL) 500 MG tablet, Take 500 mg by mouth 3 (three) times daily., Disp: , Rfl: ;  Multiple Vitamin (MULTIVITAMIN WITH MINERALS) TABS, Take 1 tablet by mouth every morning., Disp: , Rfl:  traMADol (ULTRAM) 50 MG tablet, Take 50 mg by mouth every 6 (six) hours as needed. For pain, Disp: , Rfl:  Facility-Administered Medications Ordered in Other Encounters: iohexol (OMNIPAQUE) 300 MG/ML solution 125 mL, 125 mL, Intravenous, Once PRN, Medication Radiologist, MD, 125 mL at 12/24/11 1521  BP 131/81  Pulse 88  Temp 98.2 F (36.8 C) (Oral)  Resp 20  SpO2 95%  There is no height or weight on file to calculate BMI.       Review of systems is unchanged since his  last admission. Negative except for past medical history  Physical exam alert oriented white male in no acute distress Abdomen soft nontender no rebound and no masses noted Groins well healed with no evidence of fluctuance no erythema and no tenderness Pulses are 2+ on his popliteal bilaterally. He has 2+ femoral pulses bilaterally  Impression and plan: Unusual situation with the inflammation in his aortic wall 2 months status post aortic stent graft repair. Discussed this at length with the family and the patient. We will admit him for observation and for blood cultures. I have consulted infectious disease for their assistance as well.

## 2011-12-24 NOTE — Progress Notes (Signed)
  Subjective:    Patient ID: Alfred [name], male    DOB: 07/10/1950, 61 y.o.   MRN: [id number]  HPI The tramadol was ineffective in controlling the pain; he took one Vicodin without relief also. He's continued to have fever up to 100.4 despite taking Tylenol on a  regular basis and taking Cipro and metronidazole. The pain is described as sharp and throbbing and constant. It is across the lower abdomen with some radiation to the left flank. Position change can result in temporary improvement of the pain. It does not radiate into the legs. He has had no bowel movement for 2 days.  Labs done 7/24 were reviewed. There was no elevation of white count or anemia. Potassium was 3.1. Renal function was normal  He noted some tingling in the right thigh which he related possibly to the Cipro    Review of Systems He has had intention gaseous distention after meals. He is not having diarrhea, melena, or rectal bleeding. He also denies dysuria, pyuria or hematuria.     Objective:   Physical Exam  General appearance: He appears uncomfortable and exhibits some grimacing facially Eyes: No conjunctival inflammation or scleral icterus is present.  Oral exam: Dental hygiene is good; lips and gums are healthy appearing.There is no oropharyngeal erythema or exudate noted. Tongue is somewhat dry  Heart:  Normal rate and regular rhythm. S1 and S2 normal without gallop, murmur, click, rub .S 4 .  Lungs:Chest clear to auscultation; no wheezes, rhonchi,rales ,or rubs present.No increased work of breathing.   Abdomen: bowel sounds normal, soft and non-tender without masses, organomegaly or hernias noted.  No guarding or rebound   Skin:Warm & dry.  Intact without suspicious lesions or rashes ; no jaundice or tenting  Lymphatic: No lymphadenopathy is noted about the head, neck, axilla areas.   Genital rectal exam was performed 7/24 not repeated            Assessment & Plan:  #1 constant lower abdominal  pain; no relief with tramadol or Vicodin (x1)  #2 fever, intermittent  #3 status post stenting of abdominal aortic aneurysm  #4 diverticulosis on prior CT scan  Plan: CT scan needed to define the intra-abdominal process in the context of #3 above and lack of response to broad-spectrum antibiotics for possible diverticulitis

## 2011-12-24 NOTE — ED Provider Notes (Signed)
History     CSN: [id number]  Arrival date & time 12/24/11  1626   First MD Initiated Contact with Patient 12/24/11 1712      Chief Complaint  Patient presents with  . Abdominal Pain    (Consider location/radiation/quality/duration/timing/severity/associated sxs/prior treatment) HPI 61 y/o M with PMH HTN, CAD, HLD, diverticulitis, and AAA s/p aortic stent placement 08/2011 by Dr Darrick Penna presents to ED with c/c gradually worsening mid abd pain x 3 weeks. Pain rad to back and left hip. Assoc with fever for the last 3 days, tmax 100.8 at home. Denies N/V. Does endorse constipation x 2 days that he feels is related to narcotic use. No CP, SOB, extremity weakness, numbness, or pain. No aggravating factors, pain initially relieved with PO narcotics that are no longer effective. Recent tx includes abx for suspected diverticulitis with no improvement. CT scan of abd/pelvis was ordered by Dr [name] (PMD) today and demonstrates possible periaortic infection- pt was advised to go directly to ED.     Past Medical History  Diagnosis Date  . AAA (abdominal aortic aneurysm)   . Dyslipidemia   . Left lumbar radiculopathy   . Hyperplasia of prostate without lower urinary tract symptoms (LUTS)   . Other and unspecified hyperlipidemia   . Hx of adenomatous colonic polyps 2008  . Depression   . HTN (hypertension)     unspecified essential  . CAD (coronary artery disease)     DES to RCA and CFX; Dr. Excell Seltzer cardiologist    Past Surgical History  Procedure Date  . Cardiac catheterization 2005  . Appendectomy     age 4  . Colonoscopy w/ polypectomy 2008    adenomatous  & hyperplastic polyp;due 2013  . Cystoscopy 2005    varicose veins in bladder, Dr Retta Diones  . Coronary artey stent 2002 & 2005  . Tonsillectomy and adenoidectomy     removed at age 36  . Artery repair 09/06/2011    Procedure: ARTERY REPAIR;  Surgeon: Sherren Kerns, MD;  Location: [facility] OR;  Service: Vascular;  Laterality:  Bilateral;    Family History  Problem Relation Age of Onset  . Coronary artery disease Father     CABG in 18s  . Diabetes Father   . Colon polyps Father   . Heart attack Father     ?  Marland Kitchen Cancer Father     non-small cell carcinoma/bone CA  . Hypertension Father   . Heart disease Father   . Coronary artery disease Maternal Grandfather   . Coronary artery disease Paternal Grandfather   . Other Mother     varicose veins  . Anesthesia problems Neg Hx     History  Substance Use Topics  . Smoking status: Former Smoker -- 1.0 packs/day for 27 years    Quit date: 05/31/1993  . Smokeless tobacco: Current User    Types: Snuff  . Alcohol Use: 12.6 oz/week    21 Shots of liquor per week     2-3 drinks of Vodka a night, occasional beer      Review of Systems 10 systems reviewed and are negative for acute change except as noted in the HPI.  Allergies  Review of patient's allergies indicates no known allergies.  Home Medications   Current Outpatient Rx  Name Route Sig Dispense Refill  . ACETAMINOPHEN 500 MG PO TABS Oral Take 1,000 mg by mouth every 4 (four) hours as needed. For pain    . ASPIRIN 81 MG PO TABS  Oral Take 81 mg by mouth at bedtime.     . ATORVASTATIN CALCIUM 10 MG PO TABS Oral Take 10 mg by mouth at bedtime.     Marland Kitchen CIPROFLOXACIN HCL 500 MG PO TABS Oral Take 500 mg by mouth 2 (two) times daily.    Marland Kitchen CLOPIDOGREL BISULFATE 75 MG PO TABS Oral Take 75 mg by mouth every morning.     Marland Kitchen HYDROCODONE-ACETAMINOPHEN 5-325 MG PO TABS Oral Take 1 tablet by mouth every 4 (four) hours as needed. For paim    . LOSARTAN POTASSIUM-HCTZ 100-25 MG PO TABS Oral Take 1 tablet by mouth at bedtime.     Marland Kitchen METOPROLOL SUCCINATE ER 100 MG PO TB24 Oral Take 100 mg by mouth at bedtime. Take with or immediately following a meal.    . METRONIDAZOLE 500 MG PO TABS Oral Take 500 mg by mouth 3 (three) times daily.    . ADULT MULTIVITAMIN W/MINERALS CH Oral Take 1 tablet by mouth every morning.    Marland Kitchen  TRAMADOL HCL 50 MG PO TABS Oral Take 50 mg by mouth every 6 (six) hours as needed. For pain      BP 131/81  Pulse 88  Temp 98.2 F (36.8 C) (Oral)  Resp 20  SpO2 95%  Physical Exam  Nursing note and vitals reviewed. Constitutional: He is oriented to person, place, and time. He appears well-developed and well-nourished. No distress.          HENT:  Head: Normocephalic and atraumatic.  Right Ear: External ear normal.       MMM  Eyes: Conjunctivae are normal. Pupils are equal, round, and reactive to light.  Neck: Neck supple.  Cardiovascular: Normal rate, regular rhythm and normal heart sounds.        Bilateral radial and DP pulses are 2+, symmetric.  Pulmonary/Chest: Effort normal and breath sounds normal. No respiratory distress. He has no wheezes. He exhibits no tenderness.  Abdominal: Soft. Bowel sounds are normal. He exhibits no distension. There is no tenderness.  Musculoskeletal: He exhibits no edema and no tenderness.  Lymphadenopathy:    He has no cervical adenopathy.  Neurological: He is alert and oriented to person, place, and time.       MAEW. Sensation intact to light touch in all extremities distally.  Skin: Skin is warm and dry. No rash noted.       Well-healed surgical scar to right groin, no surrounding erythema    ED Course  Procedures (including critical care time)  Labs Reviewed  CBC WITH DIFFERENTIAL - Abnormal; Notable for the following:    RBC 3.98 (*)     Hemoglobin 12.8 (*)     HCT 36.8 (*)     Neutrophils Relative 78 (*)     All other components within normal limits  COMPREHENSIVE METABOLIC PANEL - Abnormal; Notable for the following:    Sodium 131 (*)     Potassium 3.0 (*)     Chloride 89 (*)     Glucose, Bld 120 (*)     Albumin 3.0 (*)     All other components within normal limits  SEDIMENTATION RATE - Abnormal; Notable for the following:    Sed Rate 129 (*)     All other components within normal limits  URINALYSIS, ROUTINE W REFLEX  MICROSCOPIC - Abnormal; Notable for the following:    Ketones, ur 15 (*)     All other components within normal limits  C-REACTIVE PROTEIN   Ct Abdomen Pelvis  W Contrast  12/24/2011  *RADIOLOGY REPORT*  Clinical Data:  Lower abdominal pain for several weeks with associated intermittent fevers not responsive to Vicodin, superolateral Flagyl.  History of EVAR for AAA on 09/06/2011  CT ABDOMEN AND PELVIS WITH CONTRAST  Technique:  Multidetector CT imaging of the abdomen and pelvis was performed following the standard protocol during bolus administration of intravenous contrast.  Contrast: OMNIPAQUE IOHEXOL 300 MG/ML  SOLN  Comparison: CT abdomen and pelvis 10/07/2011, and pretreatment CT scan 08/19/2011.  Findings:  VASCULAR  Aorta: Interval development of a 3.3 x 2.0 cm ring enhancing fluid collection at the left posterolateral aspect of the of the excluded infrarenal abdominal aortic aneurysm sac.  The fluid collection is located at the level of the endoprosthesis flow divider. There is associated stranding of the retroperitoneal periaortic fat extending inferiorly along the iliac vessels to the pelvis. The Gore Excluder endograft is in unchanged position.  No crimping, or thrombosis.  The aneurysm sac itself is unchanged in size at 5.6 x 5.6 cm in greatest transverse dimension.  Additionally, there is no enhancement of the excluded aneurysm sac on the portal venous, or delayed phase images confirming that there is no endoleak.  Celiac: Patent, unremarkable  SMA: Patent, unremarkable  Renals: Single renal arteries bilaterally.  The renal arteries are widely patent.  Next flow next flow minimal atherosclerotic vascular disease, no evidence of focal stenosis, or aneurysmal dilatation.  Hypogastric arteries remain patent bilaterally. Interventional radiology  IMA: Excluded by the Endograft.  Distal branches fill via retrograde flow.  Inflow: The trace atherosclerotic vascular disease.  Bilateral hypogastric  arteries remain patent.  Surgical changes of bilateral groins consistent with bilateral surgical cut downs.  Veins: Limited evaluation of the veins given non venous timing.  No gross abnormality.  NON-VASCULAR  Lower Chest: Normal sized heart.  Atherosclerotic vascular calcification noted in the left anterior descending, circumflex and right coronary arteries.  No pericardial effusion.  Lung bases are clear.  Abdomen: Diffuse hypoattenuation of the liver consistent with steatosis.  The spleen, pancreas, adrenal glands, and kidneys are unremarkable.  Scattered colonic diverticular disease without active inflammation to suggest acute diverticulitis.  No free fluid in the abdomen, or pelvis. No suspicious adenopathy.  Pelvis: Normal bladder, prostate and seminal vesicles.  No free fluid in the pelvis.  No suspicious pelvic adenopathy.  Bones: No acute fracture or aggressive appearing lytic or blastic osseous lesion.  IMPRESSION:  1.  CT images are concerning for periaortic infection with possible involvement of the aortic endoprosthesis.  Interval development of a 3 x 5.2 cm rim enhancing fluid collection at the left posterolateral aspect of the aortic endograft with associated inflammatory stranding of the a periaortic retroperitoneal fat. Mild inflammatory stranding continues inferiorly along the course of the iliac vessels into the pelvis.  The finding is located at the L3-L4 interspace and begins at the region of the flow divider of the endovascular graft.  This fluid collection is separate from the excluded aneurysm sac.  Recommend clinical correlation with serum CRP and ESR. Additionally, a tagged white blood cell study may be useful to confirm the presence of infection associated with the endograft and periaortic space.  2. The aneurysm sac itself is intact, and unchanged in size compared to prior.  There is no evidence of endoleak on delayed imaging.  Critical Value/emergent results were called by telephone at  the time of interpretation on 12/24/2011 at 1530 to Dr. Marga Melnick, and also to Dr. Fabienne Bruns  at approximately 1600., who verbally acknowledged these results.  Signed,  Sterling Big, MD Vascular & Interventional Radiologist [facility] Rad[facility]  Original Report Authenticated By: Vilma Prader    Date: 12/24/2011  Rate: 92  Rhythm: sinus  QRS Axis: normal  Intervals: normal  ST/T Wave abnormalities: normal  Conduction Disutrbances:incomplete RBBB  Old EKG Reviewed: unchanged from 10/28/2004    1. Abdominal pain       MDM  Abd pain x 3 weeks with fever, pt arrives with CT scan already completed with concerning findings for ?periaortic infection. ESR, CRP ordered as mentioned in interpretation for assistance in diagnosis. EDP spoke with Dr Karlene Lineman, on-call for NSGY upon pt arrival to unit and he will see in ED for admission.         Shaaron Adler, [place] 12/24/11 2306

## 2011-12-24 NOTE — ED Notes (Signed)
The pt says his pain is better

## 2011-12-24 NOTE — ED Notes (Signed)
Dr Karlene Lineman here to see

## 2011-12-24 NOTE — ED Notes (Signed)
Pt c/o mid abd pain for approx 3 weeks and has been getting worse.  Pt st's he had abd aortic stent placed in April had CT today and was told to come to ED ref. placement

## 2011-12-24 NOTE — ED Notes (Signed)
Pain med given 

## 2011-12-24 NOTE — Patient Instructions (Addendum)
Share results with all medical staff seen   Share results with all medical staff seen

## 2011-12-25 MED ORDER — HYDROCODONE-ACETAMINOPHEN 5-325 MG PO TABS
2.0000 | ORAL_TABLET | ORAL | Status: DC | PRN
  Administered 2011-12-25: 2 via ORAL
  Administered 2011-12-25: 1 via ORAL
  Administered 2011-12-26 – 2011-12-28 (×11): 2 via ORAL
  Filled 2011-12-25 (×13): qty 2

## 2011-12-25 MED ORDER — BISACODYL 10 MG RE SUPP
10.0000 mg | Freq: Every day | RECTAL | Status: DC | PRN
  Administered 2011-12-25: 10 mg via RECTAL
  Filled 2011-12-25: qty 1

## 2011-12-25 MED ORDER — POLYETHYLENE GLYCOL 3350 17 G PO PACK
17.0000 g | PACK | Freq: Every day | ORAL | Status: DC
  Administered 2011-12-27: 17 g via ORAL
  Filled 2011-12-25 (×5): qty 1

## 2011-12-25 NOTE — Consult Note (Signed)
Infectious Diseases Initial Consultation  Reason for Consultation:  Possible endovascular graft infection   HPI: Alfred [name] is a 61 y.o. male with CAD s/p PCI, HLD, AAA who underwent abdominal aneurysm gore excluder endovascular stent graft placement on 09/06/11 with bilateral fem cutdown requiring a patch angioplasty of Left- CFA. He was doing well at his 1 month follow up visit with repeat CT scan showing stable aneurysm of 5.6-5.7cm and no endovascular leaks associated with graft. There was evidence of a small seroma in right groin. Since beginning of July, he has episodic gas pain but otherwise doing well. His abd pain/back pain has become increasingly more painful over the last 3 wks. He presented to urgent care on 7/24 for "gas pain"/ abdominal discomfort that was sharp, constant radiated to left flank. He also mentions low back pain which have all started since his surgery.In addition to his abdominal/back pain, he has had nightsweats and chillls for the past week. He was started on ciprofloxacin and metronidazole for possible enteritis? UTI ruled out but over the next 2 days despite antibiotics, he developed a fever of 100.4. He was admitted on 7/26 for further evaluation. He had repeat abd ct which now shows signs of possible endovascular graft infection since there is a new ring enhancing lesion, fluid collection 3.3 x 2 cm.  Past Medical History  Diagnosis Date  . AAA (abdominal aortic aneurysm)   . Dyslipidemia   . Left lumbar radiculopathy   . Hyperplasia of prostate without lower urinary tract symptoms (LUTS)   . Other and unspecified hyperlipidemia   . Hx of adenomatous colonic polyps 2008  . Depression   . HTN (hypertension)     unspecified essential  . CAD (coronary artery disease)     DES to RCA and CFX; Dr. Excell Seltzer cardiologist    Allergies: No Known Allergies  Current antibiotics: cipro 500mg  BID (started as o/p on 7/24) Metronidazole 500mg  TID (started as an o/p on  7/24)   MEDICATIONS:    . aspirin  81 mg Oral QHS  . atorvastatin  10 mg Oral QHS  . ciprofloxacin  500 mg Oral Once  . clopidogrel  75 mg Oral q morning - 10a  . hydrochlorothiazide  25 mg Oral Daily  . losartan  100 mg Oral Daily  . metoprolol succinate  100 mg Oral QHS  . metroNIDAZOLE  500 mg Oral Once  .  morphine injection  4 mg Intravenous Once  . multivitamin with minerals  1 tablet Oral q morning - 10a  . oxyCODONE-acetaminophen  1 tablet Oral Once  . pantoprazole  40 mg Oral Q1200  . potassium chloride  20-40 mEq Oral Once  . sodium chloride  3 mL Intravenous Q12H  . DISCONTD: ciprofloxacin  500 mg Oral BID  . DISCONTD: ciprofloxacin  500 mg Oral BID  . DISCONTD: losartan-hydrochlorothiazide  1 tablet Oral QHS  . DISCONTD: metroNIDAZOLE  500 mg Oral TID  . DISCONTD: metroNIDAZOLE  500 mg Oral TID    History  Substance Use Topics  . Smoking status: Former Smoker -- 1.0 packs/day for 27 years    Quit date: 05/31/1993  . Smokeless tobacco: Current User    Types: Snuff  . 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 He has a cat and dog, occasionally gets scratched  by his cat, but no non-healing wounds of late.  Family History  Problem Relation Age of Onset  . Coronary artery disease Father     CABG in 37s  . Diabetes Father   . Colon polyps Father   . Heart attack Father     ?  Marland Kitchen Cancer Father     non-small cell carcinoma/bone CA  . Hypertension Father   . Heart disease Father   . Coronary artery disease Maternal Grandfather   . Coronary artery disease Paternal Grandfather   . Other Mother     varicose veins  . Anesthesia problems Neg Hx     Review of Systems  Constitutional: positive for fever, chills, diaphoresis, activity change, appetite change, fatigue and unexpected weight change.  HENT: Negative for congestion, sore  throat, rhinorrhea, sneezing, trouble swallowing and sinus pressure.  Eyes: Negative for photophobia and visual disturbance.  Respiratory: Negative for cough, chest tightness, shortness of breath, wheezing and stridor.  Cardiovascular: Negative for chest pain, palpitations and leg swelling.  Gastrointestinal: constipation x 3 days; abd pain per hpi Genitourinary: Negative for dysuria, hematuria, flank pain and difficulty urinating.  Musculoskeletal: per hpi Skin: Negative for color change, pallor, rash and wound.  Neurological: Negative for dizziness, tremors, weakness and light-headedness.  Hematological: Negative for adenopathy. Does not bruise/bleed easily.  Psychiatric/Behavioral: Negative for behavioral problems, confusion, sleep disturbance, dysphoric mood, decreased concentration and agitation.    OBJECTIVE: Temp:  [97.6 F (36.4 C)-99.4 F (37.4 C)] 97.6 F (36.4 C) (07/27 0942) Pulse Rate:  [67-107] 67  (07/27 0942) Resp:  [18-21] 20  (07/27 0942) BP: (107-139)/(62-86) 129/76 mmHg (07/27 0942) SpO2:  [89 %-96 %] 95 % (07/27 0942) Weight:  [256 lb (116.121 kg)] 256 lb (116.121 kg) (07/26 2241) BP 129/76  Pulse 67  Temp 97.6 F (36.4 C) (Oral)  Resp 20  Ht 5\' 11"  (1.803 m)  Wt 256 lb (116.121 kg)  BMI 35.70 kg/m2  SpO2 95%  General Appearance:    Alert, cooperative, no distress, appears stated age  Head:    Normocephalic, without obvious abnormality, atraumatic  Eyes:    PERRL, conjunctiva/corneas clear, EOM's intact,     Nose:   Nares normal, septum midline, mucosa normal, no drainage   or sinus tenderness  Throat:   Lips, mucosa, and tongue normal; teeth and gums normal  Neck:   Supple, symmetrical, trachea midline, no adenopathy;         Back:     Symmetric, no curvature, ROM normal, no CVA tenderness. TTP on lower spine  Lungs:     Clear to auscultation bilaterally, respirations unlabored  Chest wall:    No tenderness or deformity  Heart:    Regular rate and  rhythm, S1 and S2 normal, no murmur, rub   or gallop  Abdomen:     Soft, non-tender, bowel sounds active all four quadrants,    no masses, no organomegaly        Extremities:   Extremities normal, atraumatic, no cyanosis or edema  Pulses:   2+ and symmetric all extremities  Skin:   Skin color, texture, turgor normal, no rashes or lesions  Lymph nodes:   Cervical, supraclavicular, and axillary nodes normal  Neurologic:   CNII-XII intact. Normal strength, sensation and reflexes      throughout   LABS: Results for orders placed during the hospital encounter of 12/24/11 (from the past 48 hour(s))  CBC WITH DIFFERENTIAL     Status: Abnormal   Collection  Time   12/24/11  5:22 PM      Component Value Range Comment   WBC 8.6  4.0 - 10.5 K/uL    RBC 3.98 (*) 4.22 - 5.81 MIL/uL    Hemoglobin 12.8 (*) 13.0 - 17.0 g/dL    HCT 29.5 (*) 28.4 - 52.0 %    MCV 92.5  78.0 - 100.0 fL    MCH 32.2  26.0 - 34.0 pg    MCHC 34.8  30.0 - 36.0 g/dL    RDW 13.2  44.0 - 10.2 %    Platelets 304  150 - 400 K/uL    Neutrophils Relative 78 (*) 43 - 77 %    Neutro Abs 6.7  1.7 - 7.7 K/uL    Lymphocytes Relative 14  12 - 46 %    Lymphs Abs 1.2  0.7 - 4.0 K/uL    Monocytes Relative 7  3 - 12 %    Monocytes Absolute 0.6  0.1 - 1.0 K/uL    Eosinophils Relative 1  0 - 5 %    Eosinophils Absolute 0.0  0.0 - 0.7 K/uL    Basophils Relative 0  0 - 1 %    Basophils Absolute 0.0  0.0 - 0.1 K/uL   COMPREHENSIVE METABOLIC PANEL     Status: Abnormal   Collection Time   12/24/11  5:22 PM      Component Value Range Comment   Sodium 131 (*) 135 - 145 mEq/L    Potassium 3.0 (*) 3.5 - 5.1 mEq/L    Chloride 89 (*) 96 - 112 mEq/L    CO2 29  19 - 32 mEq/L    Glucose, Bld 120 (*) 70 - 99 mg/dL    BUN 11  6 - 23 mg/dL    Creatinine, Ser 7.25  0.50 - 1.35 mg/dL    Calcium 9.4  8.4 - 36.6 mg/dL    Total Protein 7.7  6.0 - 8.3 g/dL    Albumin 3.0 (*) 3.5 - 5.2 g/dL    AST 24  0 - 37 U/L    ALT 20  0 - 53 U/L    Alkaline  Phosphatase 80  39 - 117 U/L    Total Bilirubin 0.6  0.3 - 1.2 mg/dL    GFR calc non Af Amer >90  >90 mL/min    GFR calc Af Amer >90  >90 mL/min   C-REACTIVE PROTEIN     Status: Abnormal   Collection Time   12/24/11  5:22 PM      Component Value Range Comment   CRP 26.1 (*) <0.60 mg/dL   SEDIMENTATION RATE     Status: Abnormal   Collection Time   12/24/11  5:22 PM      Component Value Range Comment   Sed Rate 129 (*) 0 - 16 mm/hr   URINALYSIS, ROUTINE W REFLEX MICROSCOPIC     Status: Abnormal   Collection Time   12/24/11  5:41 PM      Component Value Range Comment   Color, Urine YELLOW  YELLOW    APPearance CLEAR  CLEAR    Specific Gravity, Urine 1.005  1.005 - 1.030    pH 6.5  5.0 - 8.0    Glucose, UA NEGATIVE  NEGATIVE mg/dL    Hgb urine dipstick NEGATIVE  NEGATIVE    Bilirubin Urine NEGATIVE  NEGATIVE    Ketones, ur 15 (*) NEGATIVE mg/dL    Protein, ur NEGATIVE  NEGATIVE mg/dL  Urobilinogen, UA 0.2  0.0 - 1.0 mg/dL    Nitrite NEGATIVE  NEGATIVE    Leukocytes, UA NEGATIVE  NEGATIVE MICROSCOPIC NOT DONE ON URINES WITH NEGATIVE PROTEIN, BLOOD, LEUKOCYTES, NITRITE, OR GLUCOSE <1000 mg/dL.  GLUCOSE, CAPILLARY     Status: Abnormal   Collection Time   12/25/11  6:25 AM      Component Value Range Comment   Glucose-Capillary 131 (*) 70 - 99 mg/dL     MICRO: 1/61 blood cx x 2 NGTD  IMAGING: Ct Abdomen Pelvis W Contrast  12/24/2011  *RADIOLOGY REPORT*  Clinical Data:  Lower abdominal pain for several weeks with associated intermittent fevers not responsive to Vicodin, superolateral Flagyl.  History of EVAR for AAA on 09/06/2011  CT ABDOMEN AND PELVIS WITH CONTRAST  Technique:  Multidetector CT imaging of the abdomen and pelvis was performed following the standard protocol during bolus administration of intravenous contrast.  Contrast: OMNIPAQUE IOHEXOL 300 MG/ML  SOLN  Comparison: CT abdomen and pelvis 10/07/2011, and pretreatment CT scan 08/19/2011.  Findings:  VASCULAR  Aorta:  Interval development of a 3.3 x 2.0 cm ring enhancing fluid collection at the left posterolateral aspect of the of the excluded infrarenal abdominal aortic aneurysm sac.  The fluid collection is located at the level of the endoprosthesis flow divider. There is associated stranding of the retroperitoneal periaortic fat extending inferiorly along the iliac vessels to the pelvis. The Gore Excluder endograft is in unchanged position.  No crimping, or thrombosis.  The aneurysm sac itself is unchanged in size at 5.6 x 5.6 cm in greatest transverse dimension.  Additionally, there is no enhancement of the excluded aneurysm sac on the portal venous, or delayed phase images confirming that there is no endoleak.  Celiac: Patent, unremarkable  SMA: Patent, unremarkable  Renals: Single renal arteries bilaterally.  The renal arteries are widely patent.  Next flow next flow minimal atherosclerotic vascular disease, no evidence of focal stenosis, or aneurysmal dilatation.  Hypogastric arteries remain patent bilaterally. Interventional radiology  IMA: Excluded by the Endograft.  Distal branches fill via retrograde flow.  Inflow: The trace atherosclerotic vascular disease.  Bilateral hypogastric arteries remain patent.  Surgical changes of bilateral groins consistent with bilateral surgical cut downs.  Veins: Limited evaluation of the veins given non venous timing.  No gross abnormality.  NON-VASCULAR  Lower Chest: Normal sized heart.  Atherosclerotic vascular calcification noted in the left anterior descending, circumflex and right coronary arteries.  No pericardial effusion.  Lung bases are clear.  Abdomen: Diffuse hypoattenuation of the liver consistent with steatosis.  The spleen, pancreas, adrenal glands, and kidneys are unremarkable.  Scattered colonic diverticular disease without active inflammation to suggest acute diverticulitis.  No free fluid in the abdomen, or pelvis. No suspicious adenopathy.  Pelvis: Normal bladder,  prostate and seminal vesicles.  No free fluid in the pelvis.  No suspicious pelvic adenopathy.  Bones: No acute fracture or aggressive appearing lytic or blastic osseous lesion.  IMPRESSION:  1.  CT images are concerning for periaortic infection with possible involvement of the aortic endoprosthesis.  Interval development of a 3 x 5.2 cm rim enhancing fluid collection at the left posterolateral aspect of the aortic endograft with associated inflammatory stranding of the a periaortic retroperitoneal fat. Mild inflammatory stranding continues inferiorly along the course of the iliac vessels into the pelvis.  The finding is located at the L3-L4 interspace and begins at the region of the flow divider of the endovascular graft.  This fluid collection  is separate from the excluded aneurysm sac.  Recommend clinical correlation with serum CRP and ESR. Additionally, a tagged white blood cell study may be useful to confirm the presence of infection associated with the endograft and periaortic space.  2. The aneurysm sac itself is intact, and unchanged in size compared to prior.  There is no evidence of endoleak on delayed imaging.  Critical Value/emergent results were called by telephone at the time of interpretation on 12/24/2011 at 1530 to Dr. Marga Melnick, and also to Dr. Fabienne Bruns at approximately 1600., who verbally acknowledged these results.  Signed,  Sterling Big, MD Vascular & Interventional Radiologist [facility] Radiology  Original Report Authenticated By: Barkley Bruns classification paradigm used by some, (grades 3-5 termed major graft infections) with higher grade infections more likely to require partial or total graft explanation:  Grade 1: superficial, involving the skin and/or subcutaneous tissue  Grade 2: deep incisional, involving deep soft tissues, such as fascia and muscle including abscess cavities, but not coming in grossly observable direct contact with the graft  Grade 3:  infection involving the body of the graft but without anastomosis site involvement  Grade 4: infection with anastomosis site involvement  Grade 5: infection with anastomotic disruption with bleeding and/or sepsis with bloodstream infection   Assessment/Plan:  61 yo Male with presumed to have early (< 61mo.) endovascular abdominal graft infection, presenting with abdominal discomfort, low grade fever, found to have CT findings of new abscess near graft as well as elevated inflammatory markers. Appears to be Grade 2 vs. Grade 3 by samson classification.  - I have discussed with interventional radiology if it is possible to sample the abscess to send fluid for culture which does not appear to be ideal/safe  -  Will still recommend to temporarily hold antibiotics in order to improve yield of blood culture results, since we  will repeat blood cultures today, as well as tomorrow.  - after repeat blood cultures on Sunday, we will start empiric regimen of vancomycin, ceftriaxone plus oral metronidazole. Will likely add rifampin 300mg  BID to help prevent biofilm.  - will likely need a prolonged course of antibiotics of 6 wks plus chronic suppression  - recommend to get MRI of spine to see if evidence of epidural abscess that could also explain significant back pain.  - for high grade vascular graft infections (grade 3 +), optimal treatment includes surgery to do partial resection of infected graft, will discuss with Dr. Arbie Cookey if there is possibility of resection if needed.   Thank you for consultation. Duke Salvia Drue Second MD MPH [facility] [phone] pager [phone] cell

## 2011-12-25 NOTE — Progress Notes (Addendum)
[facility] [place]  Post-op EVAR Date of Surgery: April 2013 - EVAR Endoleak: No:   History of Present Illness  Alfred [name] is a 61 y.o. male who is s/p EVAR. Pt states he pasdsed a large amount of flatus yesterday and feels much better today. He is taking po well.  Pt had C/O left flank pain as part of his pre-hospital symptoms. Denies HX of kidney stones. U/A with trace RBC on 12/21/11 / urobilinogen 0.2.     The patient denies back pain; denies abdominal pain; denies lower extremity pain.  He is Ambulating and taking PO well without nausea or vomiting.   IMAGING: Ct Abdomen Pelvis W Contrast  12/24/2011  *RADIOLOGY REPORT*  Clinical Data:  Lower abdominal pain for several weeks with associated intermittent fevers not responsive to Vicodin, superolateral Flagyl.  History of EVAR for AAA on 09/06/2011  CT ABDOMEN AND PELVIS WITH CONTRAST  Technique:  Multidetector CT imaging of the abdomen and pelvis was performed following the standard protocol during bolus administration of intravenous contrast.  Contrast: OMNIPAQUE IOHEXOL 300 MG/ML  SOLN  Comparison: CT abdomen and pelvis 10/07/2011, and pretreatment CT scan 08/19/2011.  Findings:  VASCULAR  Aorta: Interval development of a 3.3 x 2.0 cm ring enhancing fluid collection at the left posterolateral aspect of the of the excluded infrarenal abdominal aortic aneurysm sac.  The fluid collection is located at the level of the endoprosthesis flow divider. There is associated stranding of the retroperitoneal periaortic fat extending inferiorly along the iliac vessels to the pelvis. The Gore Excluder endograft is in unchanged position.  No crimping, or thrombosis.  The aneurysm sac itself is unchanged in size at 5.6 x 5.6 cm in greatest transverse dimension.  Additionally, there is no enhancement of the excluded aneurysm sac on the portal venous, or delayed phase images confirming that there is no endoleak.  Celiac: Patent,  unremarkable  SMA: Patent, unremarkable  Renals: Single renal arteries bilaterally.  The renal arteries are widely patent.  Next flow next flow minimal atherosclerotic vascular disease, no evidence of focal stenosis, or aneurysmal dilatation.  Hypogastric arteries remain patent bilaterally. Interventional radiology  IMA: Excluded by the Endograft.  Distal branches fill via retrograde flow.  Inflow: The trace atherosclerotic vascular disease.  Bilateral hypogastric arteries remain patent.  Surgical changes of bilateral groins consistent with bilateral surgical cut downs.  Veins: Limited evaluation of the veins given non venous timing.  No gross abnormality.  NON-VASCULAR  Lower Chest: Normal sized heart.  Atherosclerotic vascular calcification noted in the left anterior descending, circumflex and right coronary arteries.  No pericardial effusion.  Lung bases are clear.  Abdomen: Diffuse hypoattenuation of the liver consistent with steatosis.  The spleen, pancreas, adrenal glands, and kidneys are unremarkable.  Scattered colonic diverticular disease without active inflammation to suggest acute diverticulitis.  No free fluid in the abdomen, or pelvis. No suspicious adenopathy.  Pelvis: Normal bladder, prostate and seminal vesicles.  No free fluid in the pelvis.  No suspicious pelvic adenopathy.  Bones: No acute fracture or aggressive appearing lytic or blastic osseous lesion.  IMPRESSION:  1.  CT images are concerning for periaortic infection with possible involvement of the aortic endoprosthesis.  Interval development of a 3 x 5.2 cm rim enhancing fluid collection at the left posterolateral aspect of the aortic endograft with associated inflammatory stranding of the a periaortic retroperitoneal fat. Mild inflammatory stranding continues inferiorly along the course of the iliac vessels into the pelvis.  The finding is located at the L3-L4 interspace and begins at the region of the flow divider of the endovascular  graft.  This fluid collection is separate from the excluded aneurysm sac.  Recommend clinical correlation with serum CRP and ESR. Additionally, a tagged white blood cell study may be useful to confirm the presence of infection associated with the endograft and periaortic space.  2. The aneurysm sac itself is intact, and unchanged in size compared to prior.  There is no evidence of endoleak on delayed imaging.  Critical Value/emergent results were called by telephone at the time of interpretation on 12/24/2011 at 1530 to Dr. Marga Melnick, and also to Dr. Fabienne Bruns at approximately 1600., who verbally acknowledged these results.  Signed,  Sterling Big, MD Vascular & Interventional Radiologist [facility] Radiology  Original Report Authenticated By: Rocky Link Diagnostic Studies: CBC Lab Results  Component Value Date   WBC 8.6 12/24/2011   HGB 12.8* 12/24/2011   HCT 36.8* 12/24/2011   MCV 92.5 12/24/2011   PLT 304 12/24/2011     BMET    Component Value Date/Time   NA 131* 12/24/2011 1722   K 3.0* 12/24/2011 1722   CL 89* 12/24/2011 1722   CO2 29 12/24/2011 1722   GLUCOSE 120* 12/24/2011 1722   GLUCOSE 96 05/26/2006 1118   BUN 11 12/24/2011 1722   CREATININE 0.75 12/24/2011 1722   CALCIUM 9.4 12/24/2011 1722   GFRNONAA >90 12/24/2011 1722   GFRAA >90 12/24/2011 1722    COAG Lab Results  Component Value Date   INR 1.50* 09/06/2011   INR 1.00 08/31/2011   No results found for this basename: PTT   Physical Examination  BP Readings from Last 3 Encounters:  12/25/11 129/78  12/24/11 124/82  12/21/11 110/80   Temp Readings from Last 3 Encounters:  12/25/11 97.7 F (36.5 C) Oral  12/24/11 98.1 F (36.7 C) Oral  12/21/11 98.3 F (36.8 C) Oral   SpO2 Readings from Last 3 Encounters:  12/25/11 95%  12/24/11 98%  12/21/11 98%   Pulse Readings from Last 3 Encounters:  12/25/11 75  12/24/11 95  12/21/11 78    General: A&O x 3, WDWN male in NAD Gait:  Normal Pulmonary: normal non-labored breathing  Cardiac: RRR Abdomen: soft, NT, NABS Bilateral groin wounds: clean, dry, intact, well healed without signs of infection Vascular Exam/Pulses: palpable PT bilat Extremities without ischemic changes, no Gangrene , no cellulitis; no open wounds;   Neurologic: A&O X 3; Appropriate Affect  Assessment: CALAHAN PAK is a 61 y.o. male who is 3 1/2 mo s/p EVAR.  Pt abdominal discomfort and overall malaise much better Remains afebrile  repeat labs in am Hypokalemia - replaced yesterday - pt on HCTZ  Signed: Marlowe Shores [phone] 12/25/2011 9:22 AM.   I have examined the patient, reviewed and agree with above. Remains afebrile. Comfortable. Await ID consult Pixie Burgener, MD 12/25/2011 11:19 AM

## 2011-12-26 DIAGNOSIS — Y832 Surgical operation with anastomosis, bypass or graft as the cause of abnormal reaction of the patient, or of later complication, without mention of misadventure at the time of the procedure: Secondary | ICD-10-CM

## 2011-12-26 DIAGNOSIS — T827XXA Infection and inflammatory reaction due to other cardiac and vascular devices, implants and grafts, initial encounter: Secondary | ICD-10-CM

## 2011-12-26 LAB — CBC
MCH: 31.9 pg (ref 26.0–34.0)
MCHC: 34 g/dL (ref 30.0–36.0)
Platelets: 275 10*3/uL (ref 150–400)

## 2011-12-26 LAB — BASIC METABOLIC PANEL
BUN: 10 mg/dL (ref 6–23)
Calcium: 9.1 mg/dL (ref 8.4–10.5)
GFR calc non Af Amer: >90 mL/min (ref >90)
Glucose, Bld: 135 mg/dL — ABNORMAL HIGH (ref 70–99)

## 2011-12-26 MED ORDER — POTASSIUM CHLORIDE CRYS ER 20 MEQ PO TBCR
20.0000 meq | EXTENDED_RELEASE_TABLET | Freq: Two times a day (BID) | ORAL | Status: DC
  Administered 2011-12-26 – 2011-12-29 (×7): 20 meq via ORAL
  Filled 2011-12-26 (×7): qty 1

## 2011-12-26 MED ORDER — DEXTROSE 5 % IV SOLN
2.0000 g | INTRAVENOUS | Status: DC
  Administered 2011-12-26 – 2011-12-28 (×3): 2 g via INTRAVENOUS
  Filled 2011-12-26 (×4): qty 2

## 2011-12-26 MED ORDER — RIFAMPIN 300 MG PO CAPS
300.0000 mg | ORAL_CAPSULE | Freq: Two times a day (BID) | ORAL | Status: DC
  Administered 2011-12-27 – 2011-12-29 (×5): 300 mg via ORAL
  Filled 2011-12-26 (×6): qty 1

## 2011-12-26 MED ORDER — VANCOMYCIN HCL 1000 MG IV SOLR
1750.0000 mg | Freq: Once | INTRAVENOUS | Status: AC
  Administered 2011-12-26: 1750 mg via INTRAVENOUS
  Filled 2011-12-26: qty 1750

## 2011-12-26 MED ORDER — VANCOMYCIN HCL IN DEXTROSE 1-5 GM/200ML-% IV SOLN
1000.0000 mg | Freq: Two times a day (BID) | INTRAVENOUS | Status: DC
  Administered 2011-12-27 – 2011-12-29 (×5): 1000 mg via INTRAVENOUS
  Filled 2011-12-26 (×6): qty 200

## 2011-12-26 MED ORDER — METRONIDAZOLE 500 MG PO TABS
500.0000 mg | ORAL_TABLET | Freq: Three times a day (TID) | ORAL | Status: DC
  Administered 2011-12-26 – 2011-12-29 (×9): 500 mg via ORAL
  Filled 2011-12-26 (×11): qty 1

## 2011-12-26 NOTE — Progress Notes (Addendum)
[facility] [place]   History of Present Illness  Alfred [name] is a 61 y.o. male who is s/p EVAR in April. The patient complains of back pain; denies abdominal pain; denies lower extremity pain.  He is Ambulating and taking PO well without nausea or vomiting. Pt continues to have flatus.  Significant Diagnostic Studies: CBC Lab Results  Component Value Date   WBC 7.7 12/26/2011   HGB 11.5* 12/26/2011   HCT 33.8* 12/26/2011   MCV 93.9 12/26/2011   PLT 275 12/26/2011     BMET    Component Value Date/Time   NA 135 12/26/2011 0500   K 3.4* 12/26/2011 0500   CL 95* 12/26/2011 0500   CO2 33* 12/26/2011 0500   GLUCOSE 135* 12/26/2011 0500   GLUCOSE 96 05/26/2006 1118   BUN 10 12/26/2011 0500   CREATININE 0.79 12/26/2011 0500   CALCIUM 9.1 12/26/2011 0500   GFRNONAA >90 12/26/2011 0500   GFRAA >90 12/26/2011 0500    COAG Lab Results  Component Value Date   INR 1.50* 09/06/2011   INR 1.00 08/31/2011   No results found for this basename: PTT       Patient Vitals for the past 24 hrs:  Urine Occurrence  12/26/11 0300 1     Physical Examination  BP Readings from Last 3 Encounters:  12/26/11 113/72  12/24/11 124/82  12/21/11 110/80   Temp Readings from Last 3 Encounters:  12/26/11 98.3 F (36.8 C) Oral  12/24/11 98.1 F (36.7 C) Oral  12/21/11 98.3 F (36.8 C) Oral   SpO2 Readings from Last 3 Encounters:  12/26/11 94%  12/24/11 98%  12/21/11 98%   Pulse Readings from Last 3 Encounters:  12/26/11 68  12/24/11 95  12/21/11 78    General: A&O x 3, WDWN male in NAD Gait: Normal Pulmonary: normal non-labored breathing  Cardiac: RRR Abdomen: soft, NT, NABS Bilateral groin wounds: well healed  Assessment: Alfred [name] is a 61 y.o. male s/p EVAR.  With graft infection Appreciate ID input May need PICC for long term ABX BC again today K+ 3.4 - Will supplement K+ daily - pt on HCTZ   Signed: Marlowe Shores [phone] 12/26/2011 8:18  AM.   I have examined the patient, reviewed and agree with above. Appreciate Dr. Stanford Breed help with an ID consultation. Patient remains afebrile and does not have an elevated white count. Abdomen remains completely benign year he does continue to have some back and left flank pain. Continue with the wound culture and observation prior to initiation of antibiotics. I did discuss this again at length with the patient. This could be an inflammatory aneurysm response it is not infectious. Is difficult to rule this out. Mikayla Chiusano, MD 12/26/2011 10:23 AM

## 2011-12-26 NOTE — Progress Notes (Signed)
INFECTIOUS DISEASE PROGRESS NOTE  ID: Alfred [name] is a 61 y.o. male with  Hx of AAA repair with endovascular graft in April 2013 presents with hx of a week of progressive abdominal/back pain (started 3 wks ago) and low grade fevers. Ct showing ring enhancing fluid collection concerning for endovascular graft infection  Subjective: Afebrile. Still has some back pain, less abdominal pain. Denies fever/chills/nightsweats. He is passing more flatus, feels that improves his abdominal distension.  Abtx:  cipro  7/24-7/27 Metro 7/24-7/27  Medications:  Scheduled Meds:   . aspirin  81 mg Oral QHS  . atorvastatin  10 mg Oral QHS  . clopidogrel  75 mg Oral q morning - 10a  . hydrochlorothiazide  25 mg Oral Daily  . losartan  100 mg Oral Daily  . metoprolol succinate  100 mg Oral QHS  . multivitamin with minerals  1 tablet Oral q morning - 10a  . pantoprazole  40 mg Oral Q1200  . polyethylene glycol  17 g Oral Daily  . potassium chloride  20 mEq Oral BID  . sodium chloride  3 mL Intravenous Q12H    Objective: Vital signs in last 24 hours: Temp:  [97.9 F (36.6 C)-99.5 F (37.5 C)] 98.3 F (36.8 C) (07/28 0330) Pulse Rate:  [68-90] 68  (07/28 0330) Resp:  [18-20] 18  (07/28 0330) BP: (113-125)/(70-76) 113/72 mmHg (07/28 0330) SpO2:  [94 %-98 %] 94 % (07/28 0330)  Physical Exam  Constitutional: He is oriented to person, place, and time. He appears well-developed and well-nourished. No distress.  HENT:  Mouth/Throat: Oropharynx is clear and moist. No oropharyngeal exudate.  Cardiovascular: Normal rate, regular rhythm and normal heart sounds. Exam reveals no gallop and no friction rub.  No murmur heard.  Pulmonary/Chest: Effort normal and breath sounds normal. No respiratory distress. He has no wheezes.  Abdominal: Soft. Bowel sounds are normal. He exhibits no distension. There is no tenderness.  Lymphadenopathy:  He has no cervical adenopathy.  Neurological: He is alert and  oriented to person, place, and time.  Skin: Skin is warm and dry. No rash noted. No erythema.  Psychiatric: He has a normal mood and affect. His behavior is normal.    Lab Results  Basename 12/26/11 0500 12/24/11 1722  WBC 7.7 8.6  HGB 11.5* 12.8*  HCT 33.8* 36.8*  NA 135 131*  K 3.4* 3.0*  CL 95* 89*  CO2 33* 29  BUN 10 11  CREATININE 0.79 0.75  GLU -- --   Liver Panel  Basename 12/24/11 1722  PROT 7.7  ALBUMIN 3.0*  AST 24  ALT 20  ALKPHOS 80  BILITOT 0.6  BILIDIR --  IBILI --   Sedimentation Rate  Basename 12/24/11 1722  ESRSEDRATE 129*   C-Reactive Protein  Basename 12/24/11 1722  CRP 26.1*    Microbiology: Recent Results (from the past 240 hour(s))  URINE CULTURE     Status: Normal   Collection Time   12/21/11  1:16 PM      Component Value Range Status Comment   Colony Count NO GROWTH   Final    Organism ID, Bacteria NO GROWTH   Final   CULTURE, BLOOD (ROUTINE X 2)     Status: Normal (Preliminary result)   Collection Time   12/25/11 11:05 AM      Component Value Range Status Comment   Specimen Description BLOOD RIGHT ARM   Final    Special Requests BOTTLES DRAWN AEROBIC ONLY 8CC   Final  Culture  Setup Time 12/25/2011 19:29   Final    Culture     Final    Value:        BLOOD CULTURE RECEIVED NO GROWTH TO DATE CULTURE WILL BE HELD FOR 5 DAYS BEFORE ISSUING A FINAL NEGATIVE REPORT   Report Status PENDING   Incomplete   CULTURE, BLOOD (ROUTINE X 2)     Status: Normal (Preliminary result)   Collection Time   12/25/11 11:15 AM      Component Value Range Status Comment   Specimen Description BLOOD RIGHT HAND   Final    Special Requests BOTTLES DRAWN AEROBIC ONLY 6CC   Final    Culture  Setup Time 12/25/2011 19:29   Final    Culture     Final    Value:        BLOOD CULTURE RECEIVED NO GROWTH TO DATE CULTURE WILL BE HELD FOR 5 DAYS BEFORE ISSUING A FINAL NEGATIVE REPORT   Report Status PENDING   Incomplete     Studies/Results: Ct Abdomen Pelvis W  Contrast  12/24/2011  *RADIOLOGY REPORT*  Clinical Data:  Lower abdominal pain for several weeks with associated intermittent fevers not responsive to Vicodin, superolateral Flagyl.  History of EVAR for AAA on 09/06/2011  CT ABDOMEN AND PELVIS WITH CONTRAST  Technique:  Multidetector CT imaging of the abdomen and pelvis was performed following the standard protocol during bolus administration of intravenous contrast.  Contrast: OMNIPAQUE IOHEXOL 300 MG/ML  SOLN  Comparison: CT abdomen and pelvis 10/07/2011, and pretreatment CT scan 08/19/2011.  Findings:  VASCULAR  Aorta: Interval development of a 3.3 x 2.0 cm ring enhancing fluid collection at the left posterolateral aspect of the of the excluded infrarenal abdominal aortic aneurysm sac.  The fluid collection is located at the level of the endoprosthesis flow divider. There is associated stranding of the retroperitoneal periaortic fat extending inferiorly along the iliac vessels to the pelvis. The Gore Excluder endograft is in unchanged position.  No crimping, or thrombosis.  The aneurysm sac itself is unchanged in size at 5.6 x 5.6 cm in greatest transverse dimension.  Additionally, there is no enhancement of the excluded aneurysm sac on the portal venous, or delayed phase images confirming that there is no endoleak.  Celiac: Patent, unremarkable  SMA: Patent, unremarkable  Renals: Single renal arteries bilaterally.  The renal arteries are widely patent.  Next flow next flow minimal atherosclerotic vascular disease, no evidence of focal stenosis, or aneurysmal dilatation.  Hypogastric arteries remain patent bilaterally. Interventional radiology  IMA: Excluded by the Endograft.  Distal branches fill via retrograde flow.  Inflow: The trace atherosclerotic vascular disease.  Bilateral hypogastric arteries remain patent.  Surgical changes of bilateral groins consistent with bilateral surgical cut downs.  Veins: Limited evaluation of the veins given non venous  timing.  No gross abnormality.  NON-VASCULAR  Lower Chest: Normal sized heart.  Atherosclerotic vascular calcification noted in the left anterior descending, circumflex and right coronary arteries.  No pericardial effusion.  Lung bases are clear.  Abdomen: Diffuse hypoattenuation of the liver consistent with steatosis.  The spleen, pancreas, adrenal glands, and kidneys are unremarkable.  Scattered colonic diverticular disease without active inflammation to suggest acute diverticulitis.  No free fluid in the abdomen, or pelvis. No suspicious adenopathy.  Pelvis: Normal bladder, prostate and seminal vesicles.  No free fluid in the pelvis.  No suspicious pelvic adenopathy.  Bones: No acute fracture or aggressive appearing lytic or blastic osseous lesion.  IMPRESSION:  1.  CT images are concerning for periaortic infection with possible involvement of the aortic endoprosthesis.  Interval development of a 3 x 5.2 cm rim enhancing fluid collection at the left posterolateral aspect of the aortic endograft with associated inflammatory stranding of the a periaortic retroperitoneal fat. Mild inflammatory stranding continues inferiorly along the course of the iliac vessels into the pelvis.  The finding is located at the L3-L4 interspace and begins at the region of the flow divider of the endovascular graft.  This fluid collection is separate from the excluded aneurysm sac.  Recommend clinical correlation with serum CRP and ESR. Additionally, a tagged white blood cell study may be useful to confirm the presence of infection associated with the endograft and periaortic space.  2. The aneurysm sac itself is intact, and unchanged in size compared to prior.  There is no evidence of endoleak on delayed imaging.  Critical Value/emergent results were called by telephone at the time of interpretation on 12/24/2011 at 1530 to Dr. Marga Melnick, and also to Dr. Fabienne Bruns at approximately 1600., who verbally acknowledged these  results.  Signed,  Sterling Big, MD Vascular & Interventional Radiologist [facility] Radiology  Original Report Authenticated By: Vilma Prader     Assessment/Plan: ddx thought to be presumed endovascular graft infection vs. Inflammatory non-infectious process. His report of having chills, nightsweats, low grade fever x 1 wk is concerning for infection.  - will repeat blood cultures today and start empiric regimen for presumed endovascular graft infection on 7/29 with vancomycin (goal 15-20), rifampin 300mg  PO BID, ceftriaxone 2gm Iv daily, and metronidazole 500mg  po TID.  - will change his regimen if blood cultures isolates anything, not presently covered.  - would recommend 6 wk course of therapy and possibly chronic suppression depending on follow up imaging and serial blood work on inflammatory markers  Neely Cecena Infectious Diseases 12/26/2011, 11:15 AM

## 2011-12-26 NOTE — ED Provider Notes (Signed)
Medical screening examination/treatment/procedure(s) were performed by non-physician practitioner and as supervising physician I was immediately available for consultation/collaboration.    Nelia Shi, MD 12/26/11 2207

## 2011-12-26 NOTE — Progress Notes (Signed)
ANTIBIOTIC CONSULT NOTE - INITIAL  Pharmacy Consult for Vanco Indication: abdominal aortic endovascular graft ineff  No Known Allergies  Patient Measurements: Height: 5\' 11"  (180.3 cm) Weight: 256 lb (116.121 kg) IBW/kg (Calculated) : 75.3  Adjusted Body Weight:    Vital Signs: Temp: 99 F (37.2 C) (07/28 1446) Temp src: Oral (07/28 1446) BP: 143/84 mmHg (07/28 1446) Pulse Rate: 80  (07/28 1446) Intake/Output from previous day:   Intake/Output from this shift:    Labs:  Basename 12/26/11 0500 12/24/11 1722  WBC 7.7 8.6  HGB 11.5* 12.8*  PLT 275 304  LABCREA -- --  CREATININE 0.79 0.75   Estimated Creatinine Clearance: 127.2 ml/min (by C-G formula based on Cr of 0.79). No results found for this basename: VANCOTROUGH:2,VANCOPEAK:2,VANCORANDOM:2,GENTTROUGH:2,GENTPEAK:2,GENTRANDOM:2,TOBRATROUGH:2,TOBRAPEAK:2,TOBRARND:2,AMIKACINPEAK:2,AMIKACINTROU:2,AMIKACIN:2, in the last 72 hours   Microbiology: Recent Results (from the past 720 hour(s))  URINE CULTURE     Status: Normal   Collection Time   12/21/11  1:16 PM      Component Value Range Status Comment   Colony Count NO GROWTH   Final    Organism ID, Bacteria NO GROWTH   Final   CULTURE, BLOOD (ROUTINE X 2)     Status: Normal (Preliminary result)   Collection Time   12/25/11 11:05 AM      Component Value Range Status Comment   Specimen Description BLOOD RIGHT ARM   Final    Special Requests BOTTLES DRAWN AEROBIC ONLY 8CC   Final    Culture  Setup Time 12/25/2011 19:29   Final    Culture     Final    Value:        BLOOD CULTURE RECEIVED NO GROWTH TO DATE CULTURE WILL BE HELD FOR 5 DAYS BEFORE ISSUING A FINAL NEGATIVE REPORT   Report Status PENDING   Incomplete   CULTURE, BLOOD (ROUTINE X 2)     Status: Normal (Preliminary result)   Collection Time   12/25/11 11:15 AM      Component Value Range Status Comment   Specimen Description BLOOD RIGHT HAND   Final    Special Requests BOTTLES DRAWN AEROBIC ONLY 6CC   Final    Culture  Setup Time 12/25/2011 19:29   Final    Culture     Final    Value:        BLOOD CULTURE RECEIVED NO GROWTH TO DATE CULTURE WILL BE HELD FOR 5 DAYS BEFORE ISSUING A FINAL NEGATIVE REPORT   Report Status PENDING   Incomplete     Medical History: Past Medical History  Diagnosis Date  . AAA (abdominal aortic aneurysm)   . Dyslipidemia   . Left lumbar radiculopathy   . Hyperplasia of prostate without lower urinary tract symptoms (LUTS)   . Other and unspecified hyperlipidemia   . Hx of adenomatous colonic polyps 2008  . Depression   . HTN (hypertension)     unspecified essential  . CAD (coronary artery disease)     DES to RCA and CFX; Dr. Excell Seltzer cardiologist    Medications:  Prescriptions prior to admission  Medication Sig Dispense Refill  . acetaminophen (TYLENOL) 500 MG tablet Take 1,000 mg by mouth every 4 (four) hours as needed. For pain      . aspirin 81 MG tablet Take 81 mg by mouth at bedtime.       Marland Kitchen atorvastatin (LIPITOR) 10 MG tablet Take 10 mg by mouth at bedtime.       . ciprofloxacin (CIPRO) 500 MG tablet Take  500 mg by mouth 2 (two) times daily.      . clopidogrel (PLAVIX) 75 MG tablet Take 75 mg by mouth every morning.       Marland Kitchen HYDROcodone-acetaminophen (NORCO/VICODIN) 5-325 MG per tablet Take 1 tablet by mouth every 4 (four) hours as needed. For paim      . losartan-hydrochlorothiazide (HYZAAR) 100-25 MG per tablet Take 1 tablet by mouth at bedtime.       . metoprolol succinate (TOPROL-XL) 100 MG 24 hr tablet Take 100 mg by mouth at bedtime. Take with or immediately following a meal.      . metroNIDAZOLE (FLAGYL) 500 MG tablet Take 500 mg by mouth 3 (three) times daily.      . Multiple Vitamin (MULTIVITAMIN WITH MINERALS) TABS Take 1 tablet by mouth every morning.      . traMADol (ULTRAM) 50 MG tablet Take 50 mg by mouth every 6 (six) hours as needed. For pain       Assessment: Status post stent graft repair of infrarenal abdominal aortic aneurysm in April. One  week h/o progressive abdominal/back pain (started 3 wks ago) and low grade fevers, nightsweats, chills. CT: ring enhancing fluid collection concerning for endovascular graft infection  Goal of Therapy:  Vancomycin trough level 15-20 mcg/ml PER MD note.  Plan:  Vancomycin 1750mg  IV load x 1 then 1g IV q12h. Trough after 3-5 doses Rifampin 300 po bid Rocephin 2g IV daily Flagyl 500mg  po TID Abx x 6 weeks per ID recommendation.    Alfred [name], [employer] 12/26/2011,6:35 PM

## 2011-12-27 LAB — BASIC METABOLIC PANEL
BUN: 9 mg/dL (ref 6–23)
Calcium: 8.6 mg/dL (ref 8.4–10.5)
GFR calc Af Amer: >90 mL/min (ref >90)
GFR calc non Af Amer: >90 mL/min (ref >90)
Potassium: 3.5 mEq/L (ref 3.5–5.1)

## 2011-12-27 NOTE — Progress Notes (Signed)
Pt still with some flank pain but overall improved since admission.  He remains afebrile.  Filed Vitals:   12/27/11 0412  BP: 118/77  Pulse: 70  Temp: 97.7 F (36.5 C)  Resp: 18    Abdomen- soft non tender non distended  CBC    Component Value Date/Time   WBC 7.7 12/26/2011 0500   RBC 3.60* 12/26/2011 0500   HGB 11.5* 12/26/2011 0500   HCT 33.8* 12/26/2011 0500   PLT 275 12/26/2011 0500   MCV 93.9 12/26/2011 0500   MCH 31.9 12/26/2011 0500   MCHC 34.0 12/26/2011 0500   RDW 12.4 12/26/2011 0500   LYMPHSABS 1.2 12/24/2011 1722   MONOABS 0.6 12/24/2011 1722   EOSABS 0.0 12/24/2011 1722   BASOSABS 0.0 12/24/2011 1722    Assessment: Possible endograft infection.  Will do IV antibiotics for now and rescan in 6 weeks.  Will keep in hospital a few more days until pain is better controlled.  Appreciate ID input.  If clinical condition deteriorates may need graft removal. All of these details discussed with patient today.  Fabienne Bruns, MD [facility] [place] Office: [phone] Pager: [phone]

## 2011-12-27 NOTE — Care Management Note (Unsigned)
    Page 1 of 2   12/29/2011     2:00:04 PM   CARE MANAGEMENT NOTE 12/29/2011  Patient:  Alfred [name],Alfred [name]   Account Number:  [id number]  Date Initiated:  12/27/2011  Documentation initiated by:  SIMMONS,Annalie Wenner  Subjective/Objective Assessment:   ADMITTED WITH AORTIC WALL INFLAMMATION; LIVES AT HOME WITH WIFE- LARESA; WAS IPTA- WORKS AS A FARMER IN [place]; USES [facility] IN [place] FOR RX.     Action/Plan:   DISCHARGE PLANNING DISCUSSED AT BEDSIDE.   Anticipated DC Date:  12/29/2011   Anticipated DC Plan:  HOME/SELF CARE      DC Planning Services  CM consult      PAC Choice  DURABLE MEDICAL EQUIPMENT  HOME HEALTH   Choice offered to / List presented to:  C-1 Patient   DME arranged  IV PUMP/EQUIPMENT      DME agency  [facility]     [facility] arranged  HH-1 RN      [facility] agency  [facility]   Status of service:  In process, will continue to follow Medicare Important Message given?   (If response is "NO", the following Medicare IM given date fields will be blank) Date Medicare IM given:   Date Additional Medicare IM given:    Discharge Disposition:    Per UR Regulation:  Reviewed for med. necessity/level of care/duration of stay  If discussed at Long Length of Stay Meetings, dates discussed:    Comments:  12/29/11  1041  Mikah Poss SIMMONS RN, BSN [phone] REFERRAL PLACED TO [facility] WITH [facility] FOR [facility]HRN-  IV ABX PER PT CHOICE;  SOC DATE: WITHIN 24HRS POST DISCHARGE; ALL SET UP- HOPEFUL FOR DISCHARGE TODAY NCM WILL FOLLOW.  12/27/11  1037 Gunda Maqueda SIMMONS RN, BSN [phone] NCM WILL FOLLOW.

## 2011-12-27 NOTE — Progress Notes (Signed)
Patient ID: Alfred [name], male   DOB: 1950-10-06, 61 y.o.   MRN: [id number]    [facility] for Infectious Disease    Date of Admission:  12/24/2011   Total days of antibiotics 5        Day 5 metronidazole        Day 2 vancomycin        Day 2 ceftriaxone        Day 2 rifampin     . aspirin  81 mg Oral QHS  . atorvastatin  10 mg Oral QHS  . cefTRIAXone (ROCEPHIN)  IV  2 g Intravenous Q24H  . clopidogrel  75 mg Oral q morning - 10a  . hydrochlorothiazide  25 mg Oral Daily  . losartan  100 mg Oral Daily  . metoprolol succinate  100 mg Oral QHS  . metroNIDAZOLE  500 mg Oral Q8H  . multivitamin with minerals  1 tablet Oral q morning - 10a  . pantoprazole  40 mg Oral Q1200  . polyethylene glycol  17 g Oral Daily  . potassium chloride  20 mEq Oral BID  . rifampin  300 mg Oral Q12H  . sodium chloride  3 mL Intravenous Q12H  . vancomycin  1,750 mg Intravenous Once  . vancomycin  1,000 mg Intravenous Q12H    Subjective: He is feeling a little bit better. He doesn't feel like his fevers are going up as frequently. He has not had any more chills and his abdominal pain seems to be a little less frequent. He describes his pain as intermittent. When he has it it is a dull ache that can radiate into his back on the left side down into his left hip and down into his left testicle.  Objective: Temp:  [97.7 F (36.5 C)-99.7 F (37.6 C)] 99.5 F (37.5 C) (07/29 1312) Pulse Rate:  [66-83] 66  (07/29 1312) Resp:  [18-20] 18  (07/29 1312) BP: (118-132)/(72-80) 132/80 mmHg (07/29 1312) SpO2:  [95 %-97 %] 96 % (07/29 1312)  General: He is alert and in good spirits; he does not appear to be in any distress Skin: No rash Lungs: Clear Cor: Regular S1-S2 no murmur Abdomen: Obese, soft and nontender. He can set up on the side of the bed without any discomfort.   Lab Results Lab Results  Component Value Date   WBC 7.7 12/26/2011   HGB 11.5* 12/26/2011   HCT 33.8* 12/26/2011   MCV 93.9  12/26/2011   PLT 275 12/26/2011    Lab Results  Component Value Date   CREATININE 0.72 12/27/2011   BUN 9 12/27/2011   NA 130* 12/27/2011   K 3.5 12/27/2011   CL 92* 12/27/2011   CO2 29 12/27/2011    Lab Results  Component Value Date   ALT 20 12/24/2011   AST 24 12/24/2011   ALKPHOS 80 12/24/2011   BILITOT 0.6 12/24/2011      Microbiology: Recent Results (from the past 240 hour(s))  URINE CULTURE     Status: Normal   Collection Time   12/21/11  1:16 PM      Component Value Range Status Comment   Colony Count NO GROWTH   Final    Organism ID, Bacteria NO GROWTH   Final   CULTURE, BLOOD (ROUTINE X 2)     Status: Normal (Preliminary result)   Collection Time   12/25/11 11:05 AM      Component Value Range Status Comment   Specimen Description BLOOD RIGHT  ARM   Final    Special Requests BOTTLES DRAWN AEROBIC ONLY 8CC   Final    Culture  Setup Time 12/25/2011 19:29   Final    Culture     Final    Value:        BLOOD CULTURE RECEIVED NO GROWTH TO DATE CULTURE WILL BE HELD FOR 5 DAYS BEFORE ISSUING A FINAL NEGATIVE REPORT   Report Status PENDING   Incomplete   CULTURE, BLOOD (ROUTINE X 2)     Status: Normal (Preliminary result)   Collection Time   12/25/11 11:15 AM      Component Value Range Status Comment   Specimen Description BLOOD RIGHT HAND   Final    Special Requests BOTTLES DRAWN AEROBIC ONLY 6CC   Final    Culture  Setup Time 12/25/2011 19:29   Final    Culture     Final    Value:        BLOOD CULTURE RECEIVED NO GROWTH TO DATE CULTURE WILL BE HELD FOR 5 DAYS BEFORE ISSUING A FINAL NEGATIVE REPORT   Report Status PENDING   Incomplete   CULTURE, BLOOD (ROUTINE X 2)     Status: Normal (Preliminary result)   Collection Time   12/26/11  2:30 PM      Component Value Range Status Comment   Specimen Description BLOOD RIGHT ARM   Final    Special Requests BOTTLES DRAWN AEROBIC ONLY 6CC   Final    Culture  Setup Time 12/26/2011 18:09   Final    Culture     Final    Value:        BLOOD  CULTURE RECEIVED NO GROWTH TO DATE CULTURE WILL BE HELD FOR 5 DAYS BEFORE ISSUING A FINAL NEGATIVE REPORT   Report Status PENDING   Incomplete   CULTURE, BLOOD (ROUTINE X 2)     Status: Normal (Preliminary result)   Collection Time   12/26/11  2:40 PM      Component Value Range Status Comment   Specimen Description BLOOD RIGHT HAND   Final    Special Requests BOTTLES DRAWN AEROBIC ONLY 4CC   Final    Culture  Setup Time 12/26/2011 18:09   Final    Culture     Final    Value:        BLOOD CULTURE RECEIVED NO GROWTH TO DATE CULTURE WILL BE HELD FOR 5 DAYS BEFORE ISSUING A FINAL NEGATIVE REPORT   Report Status PENDING   Incomplete     Studies/Results: No results found.  Assessment: He seems to be responding to broad empiric antibiotic therapy for presumed peri-stent graft infection. If his blood cultures remain negative I will place a PICC tomorrow and plan on a minimum of 6 weeks of empiric therapy with IV vancomycin, along with oral ciprofloxacin, metronidazole and rifampin.  Plan: 1. Place PICC tomorrow with blood cultures remain negative 2. Continue current antibiotics for now  Cliffton Asters, MD [facility] for Infectious Disease [facility] [phone] pager   [phone] cell 12/27/2011, 3:32 PM

## 2011-12-28 MED ORDER — SODIUM CHLORIDE 0.9 % IJ SOLN: 10.0000 mL | INTRAMUSCULAR | Status: DC | PRN

## 2011-12-28 MED ORDER — OXYCODONE-ACETAMINOPHEN 5-325 MG PO TABS
1.0000 | ORAL_TABLET | ORAL | Status: DC | PRN
  Administered 2011-12-28 (×3): 2 via ORAL
  Administered 2011-12-29: 1 via ORAL
  Filled 2011-12-28 (×3): qty 2
  Filled 2011-12-28: qty 1

## 2011-12-28 NOTE — Progress Notes (Signed)
Peripherally Inserted Central Catheter/Midline Placement  The IV Nurse has discussed with the patient and/or persons authorized to consent for the patient, the purpose of this procedure and the potential benefits and risks involved with this procedure.  The benefits include less needle sticks, lab draws from the catheter and patient may be discharged home with the catheter.  Risks include, but not limited to, infection, bleeding, blood clot (thrombus formation), and puncture of an artery; nerve damage and irregular heat beat.  Alternatives to this procedure were also discussed.  PICC/Midline Placement Documentation        Alfred [name] 12/28/2011, 3:48 PM

## 2011-12-28 NOTE — Progress Notes (Addendum)
[facility] [place]  Post-op EVAR History of Present Illness  Alfred [name] is a 61 y.o. male who is s/p EVAR April 2013. The patient denies back pain; complains of abdominal pain; denies lower extremity pain.  He is Ambulating and taking PO well without nausea or vomiting.  Pt states the pain in his abdomen is in the left flank to LLQ to suprapubic area into the testicle. It is intermittent and somewhat relieved with passing flatus. He continues to have low grade fever - 99.  Significant Diagnostic Studies: CBC Lab Results  Component Value Date   WBC 7.7 12/26/2011   HGB 11.5* 12/26/2011   HCT 33.8* 12/26/2011   MCV 93.9 12/26/2011   PLT 275 12/26/2011     BMET    Component Value Date/Time   NA 130* 12/27/2011 0605   K 3.5 12/27/2011 0605   CL 92* 12/27/2011 0605   CO2 29 12/27/2011 0605   GLUCOSE 143* 12/27/2011 0605   GLUCOSE 96 05/26/2006 1118   BUN 9 12/27/2011 0605   CREATININE 0.72 12/27/2011 0605   CALCIUM 8.6 12/27/2011 0605   GFRNONAA >90 12/27/2011 0605   GFRAA >90 12/27/2011 0605    COAG Lab Results  Component Value Date   INR 1.50* 09/06/2011   INR 1.00 08/31/2011   No results found for this basename: PTT     I/O last 3 completed shifts: In: 1510 [P.O.:960; IV Piggyback:550] Out: -  Patient Vitals for the past 24 hrs:  Urine Occurrence  12/27/11 2132 1   12/27/11 1700 1   12/27/11 1230 2     Physical Examination  BP Readings from Last 3 Encounters:  12/28/11 120/77  12/24/11 124/82  12/21/11 110/80   Temp Readings from Last 3 Encounters:  12/28/11 98.6 F (37 C) Oral  12/24/11 98.1 F (36.7 C) Oral  12/21/11 98.3 F (36.8 C) Oral   SpO2 Readings from Last 3 Encounters:  12/28/11 95%  12/24/11 98%  12/21/11 98%   Pulse Readings from Last 3 Encounters:  12/28/11 63  12/24/11 95  12/21/11 78    General: A&O x 3, WDWN male in NAD Gait: Normal Pulmonary: normal non-labored breathing  Cardiac: RRR Abdomen: soft, NT,  NABS Bilateral groin wounds: clean, dry, intact and well healed Extremities without ischemic changes, no Gangrene , no cellulitis; no open wounds;   Neurologic: A&O X 3; Appropriate Affect  Assessment: Alfred [name] is a 61 y.o. male who continues to have low grade fever and pain in abd / left flank, LLQ 3 months S/P EVAR.  Pt states vicodin not helping when pain is severe  Plan: Will recheck UA to R/O blood -no HX kidney stones - ? In DDx Low grade fever - cont antibiotics Change pain meds to percocet  Signed: Marlowe Shores [phone] 12/28/2011 8:21 AM.   Overall improved but still some pain.  Appreciate ID input.  Will place PICC.  Will plan on d/c when abx set up as outpt.  Will need repeat CT and follow up with me in 6 weeks.  If decline in clinical condition will need graft removal.  Fabienne Bruns, MD [facility] [place] Office: [phone] Pager: [phone]

## 2011-12-28 NOTE — Progress Notes (Signed)
Patient ID: Alfred [name], male   DOB: 1950-06-17, 61 y.o.   MRN: [id number]    [facility]    Date of Admission:  12/24/2011   Total days of antibiotics 6         Active Problems:  * No active hospital problems. *       . aspirin  81 mg Oral QHS  . atorvastatin  10 mg Oral QHS  . cefTRIAXone (ROCEPHIN)  IV  2 g Intravenous Q24H  . clopidogrel  75 mg Oral q morning - 10a  . hydrochlorothiazide  25 mg Oral Daily  . losartan  100 mg Oral Daily  . metoprolol succinate  100 mg Oral QHS  . metroNIDAZOLE  500 mg Oral Q8H  . multivitamin with minerals  1 tablet Oral q morning - 10a  . pantoprazole  40 mg Oral Q1200  . polyethylene glycol  17 g Oral Daily  . potassium chloride  20 mEq Oral BID  . rifampin  300 mg Oral Q12H  . sodium chloride  3 mL Intravenous Q12H  . vancomycin  1,000 mg Intravenous Q12H    Subjective: He is feeling a little bit better. He believes that his pain has a little less severe and coming less frequently.  Objective: Temp:  [98.6 F (37 C)-100.1 F (37.8 C)] 100.1 F (37.8 C) (07/30 1324) Pulse Rate:  [63-74] 74  (07/30 1324) Resp:  [16-18] 18  (07/30 1324) BP: (120-126)/(73-77) 126/73 mmHg (07/30 1324) SpO2:  [95 %-98 %] 98 % (07/30 1324)  General: He is alert and in no distress Lungs: Clear Cor: Regular S1-S2 no murmurs Abdomen: Soft and nontender  Lab Results Lab Results  Component Value Date   WBC 7.7 12/26/2011   HGB 11.5* 12/26/2011   HCT 33.8* 12/26/2011   MCV 93.9 12/26/2011   PLT 275 12/26/2011    Lab Results  Component Value Date   CREATININE 0.72 12/27/2011   BUN 9 12/27/2011   NA 130* 12/27/2011   K 3.5 12/27/2011   CL 92* 12/27/2011   CO2 29 12/27/2011    Lab Results  Component Value Date   ALT 20 12/24/2011   AST 24 12/24/2011   ALKPHOS 80 12/24/2011   BILITOT 0.6 12/24/2011      Microbiology: Recent Results (from the past 240 hour(s))  URINE CULTURE     Status: Normal   Collection Time   12/21/11   1:16 PM      Component Value Range Status Comment   Colony Count NO GROWTH   Final    Organism ID, Bacteria NO GROWTH   Final   CULTURE, BLOOD (ROUTINE X 2)     Status: Normal (Preliminary result)   Collection Time   12/25/11 11:05 AM      Component Value Range Status Comment   Specimen Description BLOOD RIGHT ARM   Final    Special Requests BOTTLES DRAWN AEROBIC ONLY 8CC   Final    Culture  Setup Time 12/25/2011 19:29   Final    Culture     Final    Value:        BLOOD CULTURE RECEIVED NO GROWTH TO DATE CULTURE WILL BE HELD FOR 5 DAYS BEFORE ISSUING A FINAL NEGATIVE REPORT   Report Status PENDING   Incomplete   CULTURE, BLOOD (ROUTINE X 2)     Status: Normal (Preliminary result)   Collection Time   12/25/11 11:15 AM      Component  Value Range Status Comment   Specimen Description BLOOD RIGHT HAND   Final    Special Requests BOTTLES DRAWN AEROBIC ONLY 6CC   Final    Culture  Setup Time 12/25/2011 19:29   Final    Culture     Final    Value:        BLOOD CULTURE RECEIVED NO GROWTH TO DATE CULTURE WILL BE HELD FOR 5 DAYS BEFORE ISSUING A FINAL NEGATIVE REPORT   Report Status PENDING   Incomplete   CULTURE, BLOOD (ROUTINE X 2)     Status: Normal (Preliminary result)   Collection Time   12/26/11  2:30 PM      Component Value Range Status Comment   Specimen Description BLOOD RIGHT ARM   Final    Special Requests BOTTLES DRAWN AEROBIC ONLY 6CC   Final    Culture  Setup Time 12/26/2011 18:09   Final    Culture     Final    Value:        BLOOD CULTURE RECEIVED NO GROWTH TO DATE CULTURE WILL BE HELD FOR 5 DAYS BEFORE ISSUING A FINAL NEGATIVE REPORT   Report Status PENDING   Incomplete   CULTURE, BLOOD (ROUTINE X 2)     Status: Normal (Preliminary result)   Collection Time   12/26/11  2:40 PM      Component Value Range Status Comment   Specimen Description BLOOD RIGHT HAND   Final    Special Requests BOTTLES DRAWN AEROBIC ONLY 4CC   Final    Culture  Setup Time 12/26/2011 18:09   Final     Culture     Final    Value:        BLOOD CULTURE RECEIVED NO GROWTH TO DATE CULTURE WILL BE HELD FOR 5 DAYS BEFORE ISSUING A FINAL NEGATIVE REPORT   Report Status PENDING   Incomplete     Studies/Results: No results found.  Assessment: He seems to be responding slowly to broaden empiric antibiotic therapy. His buccal are negative so I will go ahead and order a PICC anticipating at least 6 weeks of outpatient IV vancomycin along with all her oral therapy. I would consider changing ceftriaxone back to ciprofloxacin on discharge and continued along with oral metronidazole and rifampin for a broad 4 drug regimen.  Plan: 1. Continue current antibiotics 2. PICC placement  Cliffton Asters, MD [facility] [phone] pager   [phone] cell 12/28/2011, 2:08 PM

## 2011-12-29 ENCOUNTER — Other Ambulatory Visit: Payer: Self-pay | Admitting: *Deleted

## 2011-12-29 DIAGNOSIS — I714 Abdominal aortic aneurysm, without rupture, unspecified: Secondary | ICD-10-CM

## 2011-12-29 DIAGNOSIS — Z48812 Encounter for surgical aftercare following surgery on the circulatory system: Secondary | ICD-10-CM

## 2011-12-29 LAB — URINALYSIS, ROUTINE W REFLEX MICROSCOPIC
Bilirubin Urine: NEGATIVE
Ketones, ur: NEGATIVE mg/dL
Nitrite: NEGATIVE
Protein, ur: NEGATIVE mg/dL
Specific Gravity, Urine: 1.019 (ref 1.005–1.030)
Urobilinogen, UA: 1 mg/dL (ref 0.0–1.0)

## 2011-12-29 LAB — VANCOMYCIN, TROUGH: Vancomycin Tr: 12.7 ug/mL (ref 10.0–20.0)

## 2011-12-29 LAB — URINE MICROSCOPIC-ADD ON

## 2011-12-29 MED ORDER — METRONIDAZOLE 500 MG PO TABS: 500.0000 mg | ORAL_TABLET | Freq: Three times a day (TID) | ORAL | Status: DC

## 2011-12-29 MED ORDER — CIPROFLOXACIN HCL 500 MG PO TABS: 500.0000 mg | ORAL_TABLET | Freq: Two times a day (BID) | ORAL | Status: DC

## 2011-12-29 MED ORDER — RIFAMPIN 300 MG PO CAPS: 300.0000 mg | ORAL_CAPSULE | Freq: Two times a day (BID) | ORAL | Status: AC

## 2011-12-29 MED ORDER — SODIUM CHLORIDE 0.9 % IV SOLN
500.0000 mg | INTRAVENOUS | Status: AC
  Administered 2011-12-29: 500 mg via INTRAVENOUS
  Filled 2011-12-29: qty 500

## 2011-12-29 MED ORDER — VANCOMYCIN HCL 1000 MG IV SOLR
1500.0000 mg | Freq: Two times a day (BID) | INTRAVENOUS | Status: DC
  Filled 2011-12-29 (×2): qty 1500

## 2011-12-29 MED ORDER — VANCOMYCIN HCL 1000 MG IV SOLR: 1500.0000 mg | Freq: Two times a day (BID) | INTRAVENOUS | Status: DC

## 2011-12-29 MED ORDER — HYDROCODONE-ACETAMINOPHEN 5-325 MG PO TABS: 1.0000 | ORAL_TABLET | ORAL | Status: DC | PRN

## 2011-12-29 NOTE — Progress Notes (Signed)
ANTIBIOTIC CONSULT NOTE - Follow-Up  Pharmacy Consult for Vancomycin Indication: endovascular graft infection  No Known Allergies  Patient Measurements: Height: 5\' 11"  (180.3 cm) Weight: 256 lb (116.121 kg) IBW/kg (Calculated) : 75.3   Vital Signs: Temp: 98.6 F (37 C) (07/31 0604) Temp src: Oral (07/31 0604) BP: 121/79 mmHg (07/31 0604) Pulse Rate: 73  (07/31 0604) Intake/Output from previous day: 07/30 0701 - 07/31 0700 In: 1640 [P.O.:1440; IV Piggyback:200] Out: -  Intake/Output from this shift:    Labs:  [facility] 12/27/11 0605  WBC --  HGB --  PLT --  LABCREA --  CREATININE 0.72   Estimated Creatinine Clearance: 127.2 ml/min (by C-G formula based on Cr of 0.72).  Basename 12/29/11 0630  VANCOTROUGH 12.7  VANCOPEAK --  VANCORANDOM --  GENTTROUGH --  GENTPEAK --  GENTRANDOM --  TOBRATROUGH --  TOBRAPEAK --  TOBRARND --  AMIKACINPEAK --  AMIKACINTROU --  AMIKACIN --     Microbiology: Recent Results (from the past 720 hour(s))  URINE CULTURE     Status: Normal   Collection Time   12/21/11  1:16 PM      Component Value Range Status Comment   Colony Count NO GROWTH   Final    Organism ID, Bacteria NO GROWTH   Final   CULTURE, BLOOD (ROUTINE X 2)     Status: Normal (Preliminary result)   Collection Time   12/25/11 11:05 AM      Component Value Range Status Comment   Specimen Description BLOOD RIGHT ARM   Final    Special Requests BOTTLES DRAWN AEROBIC ONLY 8CC   Final    Culture  Setup Time 12/25/2011 19:29   Final    Culture     Final    Value:        BLOOD CULTURE RECEIVED NO GROWTH TO DATE CULTURE WILL BE HELD FOR 5 DAYS BEFORE ISSUING A FINAL NEGATIVE REPORT   Report Status PENDING   Incomplete   CULTURE, BLOOD (ROUTINE X 2)     Status: Normal (Preliminary result)   Collection Time   12/25/11 11:15 AM      Component Value Range Status Comment   Specimen Description BLOOD RIGHT HAND   Final    Special Requests BOTTLES DRAWN AEROBIC ONLY 6CC    Final    Culture  Setup Time 12/25/2011 19:29   Final    Culture     Final    Value:        BLOOD CULTURE RECEIVED NO GROWTH TO DATE CULTURE WILL BE HELD FOR 5 DAYS BEFORE ISSUING A FINAL NEGATIVE REPORT   Report Status PENDING   Incomplete   CULTURE, BLOOD (ROUTINE X 2)     Status: Normal (Preliminary result)   Collection Time   12/26/11  2:30 PM      Component Value Range Status Comment   Specimen Description BLOOD RIGHT ARM   Final    Special Requests BOTTLES DRAWN AEROBIC ONLY 6CC   Final    Culture  Setup Time 12/26/2011 18:09   Final    Culture     Final    Value:        BLOOD CULTURE RECEIVED NO GROWTH TO DATE CULTURE WILL BE HELD FOR 5 DAYS BEFORE ISSUING A FINAL NEGATIVE REPORT   Report Status PENDING   Incomplete   CULTURE, BLOOD (ROUTINE X 2)     Status: Normal (Preliminary result)   Collection Time   12/26/11  2:40 PM  Component Value Range Status Comment   Specimen Description BLOOD RIGHT HAND   Final    Special Requests BOTTLES DRAWN AEROBIC ONLY 4CC   Final    Culture  Setup Time 12/26/2011 18:09   Final    Culture     Final    Value:        BLOOD CULTURE RECEIVED NO GROWTH TO DATE CULTURE WILL BE HELD FOR 5 DAYS BEFORE ISSUING A FINAL NEGATIVE REPORT   Report Status PENDING   Incomplete     Assessment:  Pt on Vanc/Rocephin/rifampin D#4/ Flaygl D#7 (total abx D#7-on cipro/flagyl PTA since 7/24) for endovascular graft infection. Plan for PICC/6 wks abx -IV vanc, po flagyl/rifampin/cipro. Bld cx x 4 ngtd. ID MD following. Tm 100.1, wbc wnl.  Vanco trough 12.7 mcg/ml (subtherapeutic) on 1 gm IV q12h   Goal of Therapy:  Vancomycin trough level 15-20 mcg/ml Per MD note  Plan:  1) Will give extra 500mg  IV Vanco now to make a.m. dose equal 1500mg . Change vancomycin to 1500 mg IV q12h (to give trough ~18 mcg/ml) 2) Will check trough at new Css if pt still here. If pt d/c home, suggest trough on Friday or Saturday then once weekly while on IV Vancomycin at home.  Christoper Fabian, PharmD, BCPS Clinical pharmacist, pager [phone] 12/29/2011,8:17 AM

## 2011-12-29 NOTE — Progress Notes (Addendum)
[facility] Progress Note  12/29/2011 8:12 AM HD 6  Subjective:  States he feels better after evacuating his flatus and having BM"  Tm 100.1 now afebrile  Otherwise VSS  92%RA Filed Vitals:   12/29/11 0604  BP: 121/79  Pulse: 73  Temp: 98.6 F (37 C)  Resp: 18    Physical Exam: Cardiac:  RRR Lungs:  CTAB Abdomen:  Soft NT/ND +BS Extremities:  +palpable DP bilaterally  CBC    Component Value Date/Time   WBC 7.7 12/26/2011 0500   RBC 3.60* 12/26/2011 0500   HGB 11.5* 12/26/2011 0500   HCT 33.8* 12/26/2011 0500   PLT 275 12/26/2011 0500   MCV 93.9 12/26/2011 0500   MCH 31.9 12/26/2011 0500   MCHC 34.0 12/26/2011 0500   RDW 12.4 12/26/2011 0500   LYMPHSABS 1.2 12/24/2011 1722   MONOABS 0.6 12/24/2011 1722   EOSABS 0.0 12/24/2011 1722   BASOSABS 0.0 12/24/2011 1722    BMET    Component Value Date/Time   NA 130* 12/27/2011 0605   K 3.5 12/27/2011 0605   CL 92* 12/27/2011 0605   CO2 29 12/27/2011 0605   GLUCOSE 143* 12/27/2011 0605   GLUCOSE 96 05/26/2006 1118   BUN 9 12/27/2011 0605   CREATININE 0.72 12/27/2011 0605   CALCIUM 8.6 12/27/2011 0605   GFRNONAA >90 12/27/2011 0605   GFRAA >90 12/27/2011 0605    INR    Component Value Date/Time   INR 1.50* 09/06/2011 1730     Intake/Output Summary (Last 24 hours) at 12/29/11 1191 Last data filed at 12/28/11 2103  Gross per 24 hour  Intake   1400 ml  Output      0 ml  Net   1400 ml   Blood Cx from 7/28 NGTD Blood Cx from 7/27 NGTD  Assessment/Plan:  61 y.o. male is  S/p AAA endograft in April HD 6  -continue ABx per ID -pt received PICC line for long term IV ABx -blood cultures thus far are negative -possibly home today if pt home ABx can be arranged. -f/u with Dr. Darrick Penna in 6 weeks-sooner if he decompensates.  Doreatha Massed, PA-C [facility] [phone] 12/29/2011 8:12 AM  Overall improved, less pain D/c home today F/U 6 weeks  Fabienne Bruns, MD [facility] [place] Office: [phone] Pager: [phone]

## 2011-12-29 NOTE — Discharge Summary (Signed)
[facility] Discharge Summary  Alfred [name] November 30, 1950 61 y.o. male  [id number]  Admission Date: 12/24/2011  Discharge Date: 12/29/11  Physician: No att. providers found  Admission Diagnosis: Abdominal pain [789.00] Stent Concern   HPI:   This is a 61 y.o. male who presents today status post stent graft repair of infrarenal abdominal aortic aneurysm in April. He had an uneventful course. He did have bilateral groin cutdowns for the procedure. He had a CT scan as planned in one month showing good placement of the stent graft and no evidence of endoleak. This was in mid May. Since that time he has had vague lower abdominal discomfort. He has taken pain meds for this with some relief. He has seen urgent care center thinking this may be urinary tract infection which was negative. He has had a fever on one occasion up to 100.4 but otherwise has been afebrile. Today he saw his medical doctor and was sent for a CT scan to further evaluation. This reveals a very unusual situation with thickening of his aortic wall and potentially some fluid around his proximal aorta below the level of the renal arteries. There is no evidence of leak.   Hospital Course:  The patient was admitted to the hospital. He underwent CTA and was found to have unusual situation with inflammation in his aortic wall 2 months s/p aortic stent graft repair.  He was admitted for IV ABx, and blood cx were drawn and have been negative to date.  An ID consult was obtained.  It was recommended that he undergo 6 weeks of IV ABx.  He remained afebrile and has had a normal WBC count.  He has continued to have decreasing abdominal pain throughout his hospitalization.  Pt did get a PICC line for anticipation of 6 weeks of IV ABx.  Pt appeared to be responding slowly to the broaden empiric ABx therapy.  He is discharged on broad spectrum ABx and home health for administration of IV Vancomycin.  The remainder of the  hospital course consisted of increasing mobilization and increasing intake of solids without difficulty.  CBC    Component Value Date/Time   WBC 7.7 12/26/2011 0500   RBC 3.60* 12/26/2011 0500   HGB 11.5* 12/26/2011 0500   HCT 33.8* 12/26/2011 0500   PLT 275 12/26/2011 0500   MCV 93.9 12/26/2011 0500   MCH 31.9 12/26/2011 0500   MCHC 34.0 12/26/2011 0500   RDW 12.4 12/26/2011 0500   LYMPHSABS 1.2 12/24/2011 1722   MONOABS 0.6 12/24/2011 1722   EOSABS 0.0 12/24/2011 1722   BASOSABS 0.0 12/24/2011 1722    BMET    Component Value Date/Time   NA 130* 12/27/2011 0605   K 3.5 12/27/2011 0605   CL 92* 12/27/2011 0605   CO2 29 12/27/2011 0605   GLUCOSE 143* 12/27/2011 0605   GLUCOSE 96 05/26/2006 1118   BUN 9 12/27/2011 0605   CREATININE 0.72 12/27/2011 0605   CALCIUM 8.6 12/27/2011 0605   GFRNONAA >90 12/27/2011 0605   GFRAA >90 12/27/2011 4098     Discharge Instructions:   The patient is discharged to home with extensive instructions on wound care and progressive ambulation.  They are instructed not to drive or perform any heavy lifting until returning to see the physician in his office.  Discharge Orders    Future Appointments: Provider: Department: Dept Phone: Center:   01/21/2012 8:30 AM [facility]  None   01/21/2012 9:15 AM Tonny Bollman,  MD [facility] [phone]   04/13/2012 12:00 PM [facility] [phone] [facility]   04/13/2012 1:00 PM Sherren Kerns, MD [facility] [phone] [facility]      Discharge Diagnosis:  Abdominal pain [789.00] Stent Concern  Secondary Diagnosis: Patient Active Problem List  Diagnosis  . COLONIC POLYPS, ADENOMATOUS  . Other and unspecified hyperlipidemia  . OBESITY  . DEPRESSION  . HYPERTENSION, BENIGN  . UNSPECIFIED ESSENTIAL HYPERTENSION  . CAD  . ABDOMINAL AORTIC ANEURYSM  . HYPERPLASIA PROSTATE UNS W/O UR OBST & OTH LUTS  . LOW BACK PAIN, ACUTE  . LUMBAR RADICULOPATHY, LEFT  . UNS ADVRS  EFF UNS RX MEDICINAL&BIOLOGICAL SBSTNC   Past Medical History  Diagnosis Date  . AAA (abdominal aortic aneurysm)   . Dyslipidemia   . Left lumbar radiculopathy   . Hyperplasia of prostate without lower urinary tract symptoms (LUTS)   . Other and unspecified hyperlipidemia   . Hx of adenomatous colonic polyps 2008  . Depression   . HTN (hypertension)     unspecified essential  . CAD (coronary artery disease)     DES to RCA and CFX; Dr. Excell Seltzer cardiologist   Discharge medications:  [place], [place]  Home Medication Instructions [id number]   Printed on:12/29/11 1440  Medication Information                    aspirin 81 MG tablet Take 81 mg by mouth at bedtime.            clopidogrel (PLAVIX) 75 MG tablet Take 75 mg by mouth every morning.            metoprolol succinate (TOPROL-XL) 100 MG 24 hr tablet Take 100 mg by mouth at bedtime. Take with or immediately following a meal.           losartan-hydrochlorothiazide (HYZAAR) 100-25 MG per tablet Take 1 tablet by mouth at bedtime.            atorvastatin (LIPITOR) 10 MG tablet Take 10 mg by mouth at bedtime.            Multiple Vitamin (MULTIVITAMIN WITH MINERALS) TABS Take 1 tablet by mouth every morning.           acetaminophen (TYLENOL) 500 MG tablet Take 1,000 mg by mouth every 4 (four) hours as needed. For pain           traMADol (ULTRAM) 50 MG tablet Take 50 mg by mouth every 6 (six) hours as needed. For pain           ciprofloxacin (CIPRO) 500 MG tablet Take 1 tablet (500 mg total) by mouth 2 (two) times daily. Rx given          HYDROcodone-acetaminophen (NORCO/VICODIN) 5-325 MG per tablet Take 1 tablet by mouth every 4 (four) hours as needed. For paim #30 NR          metroNIDAZOLE (FLAGYL) 500 MG tablet Take 1 tablet (500 mg total) by mouth 3 (three) times daily. Rx given          rifampin (RIFADIN) 300 MG capsule Take 1 capsule (300 mg total) by mouth every 12 (twelve) hours. Rx given          sodium  chloride 0.9 % SOLN 500 mL with vancomycin 1000 MG SOLR 1,500 mg Inject 1,500 mg into the vein every 12 (twelve) hours. Rx given             Disposition:  home  Patient's condition: is Good  Follow up: 1. Dr. Darrick Penna in 6 weeks with CTA.  He will follow up sooner if his condition decompensates. 2. Dr. Orvan Falconer in 2 weeks.  Doreatha Massed, PA-C [facility] [phone] 12/29/2011  8:45 AM

## 2011-12-30 ENCOUNTER — Telehealth: Payer: Self-pay | Admitting: Vascular Surgery

## 2011-12-30 NOTE — Telephone Encounter (Signed)
Message copied by Fredrich Birks on Thu Dec 30, 2011 11:25 AM ------      Message from: Lorin Mercy K      Created: Wed Dec 29, 2011  8:59 AM      Regarding: schedule                   ----- Message -----         From: Dara Lords, PA         Sent: 12/29/2011   8:42 AM           To: Sharee Pimple, CMA            S/p endograft for AAA in April.      Admitted past week with possible infection.            Is going home with 6 weeks of ABX and Dr. Darrick Penna wants to see the pt back in 6 weeks with a CTA for endograft protocol.            (will you order the CTA?)  If not, let me know and I will order.            Thanks,      Lelon Mast

## 2011-12-30 NOTE — Telephone Encounter (Signed)
Spoke with patient to schedule cta and ov, pt aware, sent letter, dpm

## 2011-12-31 LAB — CULTURE, BLOOD (ROUTINE X 2): Culture: NO GROWTH

## 2012-01-01 LAB — CULTURE, BLOOD (ROUTINE X 2): Culture: NO GROWTH

## 2012-01-03 ENCOUNTER — Ambulatory Visit (INDEPENDENT_AMBULATORY_CARE_PROVIDER_SITE_OTHER): Payer: Managed Care, Other (non HMO) | Admitting: Neurosurgery

## 2012-01-03 ENCOUNTER — Telehealth: Payer: Self-pay

## 2012-01-03 ENCOUNTER — Encounter: Payer: Self-pay | Admitting: Neurosurgery

## 2012-01-03 ENCOUNTER — Ambulatory Visit
Admission: RE | Admit: 2012-01-03 | Discharge: 2012-01-03 | Disposition: A | Discharge status: Home / Self Care | Payer: Managed Care, Other (non HMO) | Source: Ambulatory Visit | Attending: Surgery | Admitting: Surgery

## 2012-01-03 ENCOUNTER — Telehealth: Payer: Self-pay | Admitting: Vascular Surgery

## 2012-01-03 VITALS — BP 99/67 | HR 93 | Temp 98.1°F | Resp 18 | Ht 72.0 in | Wt 251.9 lb

## 2012-01-03 DIAGNOSIS — I714 Abdominal aortic aneurysm, without rupture, unspecified: Secondary | ICD-10-CM

## 2012-01-03 DIAGNOSIS — M549 Dorsalgia, unspecified: Secondary | ICD-10-CM

## 2012-01-03 DIAGNOSIS — R109 Unspecified abdominal pain: Secondary | ICD-10-CM | POA: Insufficient documentation

## 2012-01-03 MED ORDER — IOHEXOL 350 MG/ML SOLN
100.0000 mL | Freq: Once | INTRAVENOUS | Status: AC | PRN
  Administered 2012-01-03: 100 mL via INTRAVENOUS

## 2012-01-03 MED ORDER — IOHEXOL 350 MG/ML SOLN: 100.0000 mL | Freq: Once | INTRAVENOUS | Status: AC | PRN

## 2012-01-03 NOTE — Telephone Encounter (Signed)
Phone call from pt.  C/o having "worsening of pain in (L) low back/upper hip toward the spine, and radiating down into the left testicle".  States he has had a fever of approx. 100 degrees in the afternoon the past couple of days.  States the difference in the pain that he had in the hospital, is there is no abdominal pain at this time...reports only the (L) back/upper hip and pain into left testicle.  Describes pain as "relentless".  States the Hydrocodone is not working at all.  States Hydrocodone is like taking [employer] candy.  Denies any nausea or vomiting.  Did state that having a BM, eases-up the pain for a little while.  Discussed w/ Dr. Myra Gianotti.  Stated to schedule a CTA abdomen/pelvis, and bring pt. In to see the NP today.  Wife advised of the above.  Agrees with plan.

## 2012-01-03 NOTE — Progress Notes (Signed)
[facility] [place] AAA/PAD/PVD Office Note  CC: Post discharge followup for left low back flank and groin pain Referring Physician: Fields  History of Present Illness: 61-year-old patient of Dr. Darrick Penna who underwent AAA aneurysm repair with stent graft in April. The patient was subsequently admitted July 26 July 31 for abdominal pain. During the hospitalization there was a CT that showed good placement of the graft and no evidence of endoleak and had a vague finding of thickening of the aortic wall and fluid around proximal aorta. The patient call the office today and Dr. Myra Gianotti ordered a repeat CT scan and the patient came in to discuss that with him.  Past Medical History  Diagnosis Date  . AAA (abdominal aortic aneurysm)   . Dyslipidemia   . Left lumbar radiculopathy   . Hyperplasia of prostate without lower urinary tract symptoms (LUTS)   . Other and unspecified hyperlipidemia   . Hx of adenomatous colonic polyps 2008  . Depression   . HTN (hypertension)     unspecified essential  . CAD (coronary artery disease)     DES to RCA and CFX; Dr. Excell Seltzer cardiologist    ROS: [x]  Positive   [ ]  Denies    General: [ ]  Weight loss, [ ]  Fever, [ ]  chills Neurologic: [ ]  Dizziness, [ ]  Blackouts, [ ]  Seizure [ ]  Stroke, [ ]  "Mini stroke", [ ]  Slurred speech, [ ]  Temporary blindness; [ ]  weakness in arms or legs, [ ]  Hoarseness Cardiac: [ ]  Chest pain/pressure, [ ]  Shortness of breath at rest [ ]  Shortness of breath with exertion, [ ]  Atrial fibrillation or irregular heartbeat Vascular: [ ]  Pain in legs with walking, [ ]  Pain in legs at rest, [ ]  Pain in legs at night,  [ ]  Non-healing ulcer, [ ]  Blood clot in vein/DVT,   Pulmonary: [ ]  Home oxygen, [ ]  Productive cough, [ ]  Coughing up blood, [ ]  Asthma,  [ ]  Wheezing Musculoskeletal:  [ ]  Arthritis, [ ]  Low back pain, [ ]  Joint pain Hematologic: [ ]  Easy Bruising, [ ]  Anemia; [ ]  Hepatitis Gastrointestinal: [ ]  Blood  in stool, [ ]  Gastroesophageal Reflux/heartburn, [ ]  Trouble swallowing Urinary: [ ]  chronic Kidney disease, [ ]  on HD - [ ]  MWF or [ ]  TTHS, [ ]  Burning with urination, [ ]  Difficulty urinating Skin: [ ]  Rashes, [ ]  Wounds Psychological: [ ]  Anxiety, [ ]  Depression   Social History History  Substance Use Topics  . Smoking status: Former Smoker -- 1.0 packs/day for 27 years    Quit date: 05/31/1993  . Smokeless tobacco: Current User    Types: Snuff  . Alcohol Use: 12.6 oz/week    21 Shots of liquor per week     2-3 drinks of Vodka a night, occasional beer    Family History Family History  Problem Relation Age of Onset  . Coronary artery disease Father     CABG in 63s  . Diabetes Father   . Colon polyps Father   . Heart attack Father     ?  Marland Kitchen Cancer Father     non-small cell carcinoma/bone CA  . Hypertension Father   . Heart disease Father   . Coronary artery disease Maternal Grandfather   . Coronary artery disease Paternal Grandfather   . Other Mother     varicose veins  . Anesthesia problems Neg Hx     No Known Allergies  Current Outpatient Prescriptions  Medication Sig Dispense Refill  . acetaminophen (TYLENOL) 500 MG tablet Take 1,000 mg by mouth every 4 (four) hours as needed. For pain      . aspirin 81 MG tablet Take 81 mg by mouth at bedtime.       Marland Kitchen atorvastatin (LIPITOR) 10 MG tablet Take 10 mg by mouth at bedtime.       . ciprofloxacin (CIPRO) 500 MG tablet Take 1 tablet (500 mg total) by mouth 2 (two) times daily.  84 tablet  0  . clopidogrel (PLAVIX) 75 MG tablet Take 75 mg by mouth every morning.       . Heparin Sodium, Porcine, (SASH KIT IJ) Inject as directed.      Marland Kitchen HYDROcodone-acetaminophen (NORCO/VICODIN) 5-325 MG per tablet Take 1 tablet by mouth every 4 (four) hours as needed. For paim  30 tablet  0  . losartan-hydrochlorothiazide (HYZAAR) 100-25 MG per tablet Take 1 tablet by mouth at bedtime.       . metoprolol succinate (TOPROL-XL) 100 MG 24 hr  tablet Take 100 mg by mouth at bedtime. Take with or immediately following a meal.      . metroNIDAZOLE (FLAGYL) 500 MG tablet Take 1 tablet (500 mg total) by mouth 3 (three) times daily.  126 tablet  0  . Multiple Vitamin (MULTIVITAMIN WITH MINERALS) TABS Take 1 tablet by mouth every morning.      . rifampin (RIFADIN) 300 MG capsule Take 1 capsule (300 mg total) by mouth every 12 (twelve) hours.  84 capsule  0  . sodium chloride 0.9 % SOLN 500 mL with vancomycin 1000 MG SOLR 1,500 mg Inject 1,500 mg into the vein every 12 (twelve) hours.  84 application  0   No current facility-administered medications for this visit.   Facility-Administered Medications Ordered in Other Visits  Medication Dose Route Frequency Provider Last Rate Last Dose  . iohexol (OMNIPAQUE) 350 MG/ML injection 100 mL  100 mL Intravenous Once PRN Medication Radiologist, MD   100 mL at 01/03/12 1503  . iohexol (OMNIPAQUE) 350 MG/ML injection 100 mL  100 mL Intravenous Once PRN Medication Radiologist, MD        Physical Examination  Filed Vitals:   01/03/12 1550  BP: 99/67  Pulse: 93  Temp: 98.1 F (36.7 C)  Resp: 18    Body mass index is 34.16 kg/(m^2).  General:  WDWN in NAD Gait: Normal HEENT: WNL Eyes: Pupils equal Pulmonary: normal non-labored breathing , without Rales, rhonchi,  wheezing Cardiac: RRR, without  Murmurs, rubs or gallops; No carotid bruits Abdomen: soft, NT, no masses Skin: no rashes, ulcers noted Vascular Exam/Pulses: Femoral pulses are palpable bilaterally, no abdominal pulses palpated  Extremities without ischemic changes, no Gangrene , no cellulitis; no open wounds;  Musculoskeletal: no muscle wasting or atrophy  Neurologic: A&O X 3; Appropriate Affect ; SENSATION: normal; MOTOR FUNCTION:  moving all extremities equally. Speech is fluent/normal  Non-Invasive Vascular Imaging: Please refer to CT A. findings, (slight increase in appearance of questionable fluid)  ASSESSMENT/PLAN: Dr.  Myra Gianotti spoke with the patient and his wife at length and reviewed the CTA from today with them. The plan will be for Dr. Myra Gianotti to discuss the situation with Dr. Darrick Penna and someone from our office will contact the patient tomorrow regarding a final disposition and plan. The patient's questions were encouraged and answered, they're in agreement with this plan. Dr. Myra Gianotti issued a new pain prescription for the patient today.  Lauree Chandler ANP next  Clinic M.D.: Myra Gianotti

## 2012-01-03 NOTE — Telephone Encounter (Signed)
Spoke with patient regarding worsening pain and increase in perigraft fluid collection on CT today.  He will be NPO post midnight and come to [facility] tomorrow morning for CT guided aspiration of the fluid collection with planned hospital admission post procedure.  The patient is to go to the [facility] ER tonight if worsening pain or fever.  Fabienne Bruns, MD [facility] [place] Office: [phone] Pager: [phone]

## 2012-01-04 ENCOUNTER — Inpatient Hospital Stay (HOSPITAL_COMMUNITY)
Admission: AD | Admit: 2012-01-04 | Discharge: 2012-01-10 | DRG: 920 | Disposition: A | Discharge status: Home / Self Care | Payer: Managed Care, Other (non HMO) | Source: Ambulatory Visit | Attending: Vascular Surgery | Admitting: Vascular Surgery

## 2012-01-04 ENCOUNTER — Encounter (HOSPITAL_COMMUNITY): Payer: Self-pay

## 2012-01-04 ENCOUNTER — Other Ambulatory Visit: Payer: Self-pay | Admitting: Vascular Surgery

## 2012-01-04 ENCOUNTER — Inpatient Hospital Stay (HOSPITAL_COMMUNITY)
Admission: RE | Admit: 2012-01-04 | Discharge: 2012-01-04 | Disposition: A | Discharge status: Home / Self Care | Payer: Managed Care, Other (non HMO) | Source: Ambulatory Visit | Attending: Vascular Surgery | Admitting: Vascular Surgery

## 2012-01-04 DIAGNOSIS — Z9861 Coronary angioplasty status: Secondary | ICD-10-CM

## 2012-01-04 DIAGNOSIS — Y832 Surgical operation with anastomosis, bypass or graft as the cause of abnormal reaction of the patient, or of later complication, without mention of misadventure at the time of the procedure: Secondary | ICD-10-CM | POA: Diagnosis present

## 2012-01-04 DIAGNOSIS — E785 Hyperlipidemia, unspecified: Secondary | ICD-10-CM | POA: Diagnosis present

## 2012-01-04 DIAGNOSIS — M545 Low back pain, unspecified: Secondary | ICD-10-CM | POA: Diagnosis present

## 2012-01-04 DIAGNOSIS — N138 Other obstructive and reflux uropathy: Secondary | ICD-10-CM | POA: Diagnosis present

## 2012-01-04 DIAGNOSIS — T8579XA Infection and inflammatory reaction due to other internal prosthetic devices, implants and grafts, initial encounter: Principal | ICD-10-CM | POA: Diagnosis present

## 2012-01-04 DIAGNOSIS — L0291 Cutaneous abscess, unspecified: Secondary | ICD-10-CM

## 2012-01-04 DIAGNOSIS — M79609 Pain in unspecified limb: Secondary | ICD-10-CM

## 2012-01-04 DIAGNOSIS — N401 Enlarged prostate with lower urinary tract symptoms: Secondary | ICD-10-CM | POA: Diagnosis present

## 2012-01-04 DIAGNOSIS — I10 Essential (primary) hypertension: Secondary | ICD-10-CM | POA: Diagnosis present

## 2012-01-04 DIAGNOSIS — Y92009 Unspecified place in unspecified non-institutional (private) residence as the place of occurrence of the external cause: Secondary | ICD-10-CM

## 2012-01-04 DIAGNOSIS — I2510 Atherosclerotic heart disease of native coronary artery without angina pectoris: Secondary | ICD-10-CM | POA: Diagnosis present

## 2012-01-04 DIAGNOSIS — Z87891 Personal history of nicotine dependence: Secondary | ICD-10-CM

## 2012-01-04 DIAGNOSIS — F3289 Other specified depressive episodes: Secondary | ICD-10-CM | POA: Diagnosis present

## 2012-01-04 DIAGNOSIS — F329 Major depressive disorder, single episode, unspecified: Secondary | ICD-10-CM | POA: Diagnosis present

## 2012-01-04 DIAGNOSIS — E876 Hypokalemia: Secondary | ICD-10-CM | POA: Diagnosis present

## 2012-01-04 DIAGNOSIS — Z8249 Family history of ischemic heart disease and other diseases of the circulatory system: Secondary | ICD-10-CM

## 2012-01-04 DIAGNOSIS — Z860100 Personal history of colon polyps, unspecified: Secondary | ICD-10-CM

## 2012-01-04 DIAGNOSIS — R188 Other ascites: Secondary | ICD-10-CM | POA: Diagnosis present

## 2012-01-04 DIAGNOSIS — IMO0002 Reserved for concepts with insufficient information to code with codable children: Secondary | ICD-10-CM | POA: Diagnosis present

## 2012-01-04 DIAGNOSIS — R109 Unspecified abdominal pain: Secondary | ICD-10-CM | POA: Diagnosis present

## 2012-01-04 DIAGNOSIS — Z9089 Acquired absence of other organs: Secondary | ICD-10-CM

## 2012-01-04 LAB — URINALYSIS, ROUTINE W REFLEX MICROSCOPIC
Leukocytes, UA: NEGATIVE
Nitrite: NEGATIVE
Specific Gravity, Urine: 1.013 (ref 1.005–1.030)
pH: 6.5 (ref 5.0–8.0)

## 2012-01-04 LAB — CBC
MCV: 91.2 fL (ref 78.0–100.0)
Platelets: 534 10*3/uL — ABNORMAL HIGH (ref 150–400)
RDW: 12.5 % (ref 11.5–15.5)
WBC: 6.7 10*3/uL (ref 4.0–10.5)

## 2012-01-04 LAB — PROTIME-INR: INR: 1.24 (ref 0.00–1.49)

## 2012-01-04 LAB — COMPREHENSIVE METABOLIC PANEL
AST: 17 U/L (ref 0–37)
Albumin: 2.8 g/dL — ABNORMAL LOW (ref 3.5–5.2)
Calcium: 8.9 mg/dL (ref 8.4–10.5)
Chloride: 91 mEq/L — ABNORMAL LOW (ref 96–112)
Creatinine, Ser: 0.74 mg/dL (ref 0.50–1.35)
Sodium: 132 mEq/L — ABNORMAL LOW (ref 135–145)
Total Bilirubin: 0.5 mg/dL (ref 0.3–1.2)

## 2012-01-04 MED ORDER — CIPROFLOXACIN HCL 500 MG PO TABS
500.0000 mg | ORAL_TABLET | Freq: Two times a day (BID) | ORAL | Status: DC
  Administered 2012-01-04 – 2012-01-05 (×2): 500 mg via ORAL
  Filled 2012-01-04 (×4): qty 1

## 2012-01-04 MED ORDER — FENTANYL CITRATE 0.05 MG/ML IJ SOLN
INTRAMUSCULAR | Status: AC | PRN
  Administered 2012-01-04: 25 ug via INTRAVENOUS
  Administered 2012-01-04 (×2): 50 ug via INTRAVENOUS

## 2012-01-04 MED ORDER — ASPIRIN 81 MG PO TABS: 81.0000 mg | ORAL_TABLET | Freq: Every day | ORAL | Status: DC

## 2012-01-04 MED ORDER — PHENOL 1.4 % MT LIQD
1.0000 | OROMUCOSAL | Status: DC | PRN
  Filled 2012-01-04: qty 177

## 2012-01-04 MED ORDER — CLOPIDOGREL BISULFATE 75 MG PO TABS
75.0000 mg | ORAL_TABLET | Freq: Every morning | ORAL | Status: DC
  Administered 2012-01-05 – 2012-01-10 (×6): 75 mg via ORAL
  Filled 2012-01-04 (×7): qty 1

## 2012-01-04 MED ORDER — MIDAZOLAM HCL 2 MG/2ML IJ SOLN
INTRAMUSCULAR | Status: AC
  Filled 2012-01-04: qty 4

## 2012-01-04 MED ORDER — POTASSIUM CHLORIDE 20 MEQ PO PACK: 20.0000 meq | PACK | Freq: Three times a day (TID) | ORAL | Status: DC

## 2012-01-04 MED ORDER — LOSARTAN POTASSIUM-HCTZ 100-25 MG PO TABS: 1.0000 | ORAL_TABLET | Freq: Every day | ORAL | Status: DC

## 2012-01-04 MED ORDER — ADULT MULTIVITAMIN W/MINERALS CH
1.0000 | ORAL_TABLET | Freq: Every morning | ORAL | Status: DC
  Administered 2012-01-05 – 2012-01-10 (×6): 1 via ORAL
  Filled 2012-01-04 (×7): qty 1

## 2012-01-04 MED ORDER — RIFAMPIN 300 MG PO CAPS
300.0000 mg | ORAL_CAPSULE | Freq: Two times a day (BID) | ORAL | Status: DC
  Administered 2012-01-04 – 2012-01-09 (×11): 300 mg via ORAL
  Filled 2012-01-04 (×14): qty 1

## 2012-01-04 MED ORDER — VANCOMYCIN HCL IN DEXTROSE 1-5 GM/200ML-% IV SOLN
1000.0000 mg | Freq: Three times a day (TID) | INTRAVENOUS | Status: DC
  Administered 2012-01-05 – 2012-01-10 (×17): 1000 mg via INTRAVENOUS
  Filled 2012-01-04 (×21): qty 200

## 2012-01-04 MED ORDER — DIPHENHYDRAMINE HCL 12.5 MG/5ML PO ELIX
12.5000 mg | ORAL_SOLUTION | Freq: Four times a day (QID) | ORAL | Status: DC | PRN
  Filled 2012-01-04: qty 5

## 2012-01-04 MED ORDER — VANCOMYCIN HCL 1000 MG IV SOLR
1500.0000 mg | Freq: Two times a day (BID) | INTRAVENOUS | Status: DC
  Administered 2012-01-04: 1500 mg via INTRAVENOUS
  Filled 2012-01-04 (×2): qty 1500

## 2012-01-04 MED ORDER — ONDANSETRON HCL 4 MG/2ML IJ SOLN: 4.0000 mg | Freq: Four times a day (QID) | INTRAMUSCULAR | Status: DC | PRN

## 2012-01-04 MED ORDER — POTASSIUM CHLORIDE CRYS ER 20 MEQ PO TBCR
20.0000 meq | EXTENDED_RELEASE_TABLET | Freq: Three times a day (TID) | ORAL | Status: DC
  Administered 2012-01-04 – 2012-01-10 (×19): 20 meq via ORAL
  Filled 2012-01-04 (×21): qty 1

## 2012-01-04 MED ORDER — DEXTROSE-NACL 5-0.45 % IV SOLN
INTRAVENOUS | Status: DC
  Administered 2012-01-09: 13:00:00 via INTRAVENOUS

## 2012-01-04 MED ORDER — ASPIRIN 81 MG PO CHEW
81.0000 mg | CHEWABLE_TABLET | Freq: Every day | ORAL | Status: DC
  Administered 2012-01-04 – 2012-01-09 (×6): 81 mg via ORAL
  Filled 2012-01-04 (×6): qty 1

## 2012-01-04 MED ORDER — ATORVASTATIN CALCIUM 10 MG PO TABS
10.0000 mg | ORAL_TABLET | Freq: Every day | ORAL | Status: DC
  Administered 2012-01-04 – 2012-01-09 (×6): 10 mg via ORAL
  Filled 2012-01-04 (×8): qty 1

## 2012-01-04 MED ORDER — LOSARTAN POTASSIUM 50 MG PO TABS
100.0000 mg | ORAL_TABLET | Freq: Every day | ORAL | Status: DC
  Administered 2012-01-04 – 2012-01-10 (×7): 100 mg via ORAL
  Filled 2012-01-04 (×7): qty 2

## 2012-01-04 MED ORDER — VANCOMYCIN HCL 1000 MG IV SOLR: 1500.0000 mg | Freq: Two times a day (BID) | INTRAVENOUS | Status: DC

## 2012-01-04 MED ORDER — MIDAZOLAM HCL 5 MG/5ML IJ SOLN
INTRAMUSCULAR | Status: AC | PRN
  Administered 2012-01-04: 1 mg via INTRAVENOUS
  Administered 2012-01-04: 2 mg via INTRAVENOUS

## 2012-01-04 MED ORDER — GUAIFENESIN-DM 100-10 MG/5ML PO SYRP: 15.0000 mL | ORAL_SOLUTION | ORAL | Status: DC | PRN

## 2012-01-04 MED ORDER — FENTANYL CITRATE 0.05 MG/ML IJ SOLN
INTRAMUSCULAR | Status: AC
  Filled 2012-01-04: qty 4

## 2012-01-04 MED ORDER — METOPROLOL TARTRATE 1 MG/ML IV SOLN: 2.0000 mg | INTRAVENOUS | Status: DC | PRN

## 2012-01-04 MED ORDER — SODIUM CHLORIDE 0.9 % IJ SOLN: 9.0000 mL | INTRAMUSCULAR | Status: DC | PRN

## 2012-01-04 MED ORDER — HYDROCHLOROTHIAZIDE 25 MG PO TABS
25.0000 mg | ORAL_TABLET | Freq: Every day | ORAL | Status: DC
  Administered 2012-01-05 – 2012-01-10 (×6): 25 mg via ORAL
  Filled 2012-01-04 (×7): qty 1

## 2012-01-04 MED ORDER — PANTOPRAZOLE SODIUM 40 MG PO TBEC
40.0000 mg | DELAYED_RELEASE_TABLET | Freq: Every day | ORAL | Status: DC
  Administered 2012-01-04 – 2012-01-09 (×6): 40 mg via ORAL
  Filled 2012-01-04 (×6): qty 1

## 2012-01-04 MED ORDER — DIPHENHYDRAMINE HCL 50 MG/ML IJ SOLN: 12.5000 mg | Freq: Four times a day (QID) | INTRAMUSCULAR | Status: DC | PRN

## 2012-01-04 MED ORDER — HYDRALAZINE HCL 20 MG/ML IJ SOLN
10.0000 mg | INTRAMUSCULAR | Status: DC | PRN
  Filled 2012-01-04: qty 0.5

## 2012-01-04 MED ORDER — NALOXONE HCL 0.4 MG/ML IJ SOLN: 0.4000 mg | INTRAMUSCULAR | Status: DC | PRN

## 2012-01-04 MED ORDER — ALUM & MAG HYDROXIDE-SIMETH 200-200-20 MG/5ML PO SUSP: 15.0000 mL | ORAL | Status: DC | PRN

## 2012-01-04 MED ORDER — METOPROLOL SUCCINATE ER 100 MG PO TB24
100.0000 mg | ORAL_TABLET | Freq: Every day | ORAL | Status: DC
  Administered 2012-01-04 – 2012-01-09 (×6): 100 mg via ORAL
  Filled 2012-01-04 (×8): qty 1

## 2012-01-04 MED ORDER — LABETALOL HCL 5 MG/ML IV SOLN
10.0000 mg | INTRAVENOUS | Status: DC | PRN
  Filled 2012-01-04: qty 4

## 2012-01-04 MED ORDER — METRONIDAZOLE 500 MG PO TABS
500.0000 mg | ORAL_TABLET | Freq: Three times a day (TID) | ORAL | Status: DC
  Administered 2012-01-04 – 2012-01-10 (×18): 500 mg via ORAL
  Filled 2012-01-04 (×22): qty 1

## 2012-01-04 MED ORDER — POTASSIUM CHLORIDE CRYS ER 20 MEQ PO TBCR
20.0000 meq | EXTENDED_RELEASE_TABLET | Freq: Once | ORAL | Status: AC
  Administered 2012-01-04: 20 meq via ORAL
  Filled 2012-01-04: qty 1

## 2012-01-04 MED ORDER — HYDROMORPHONE 0.3 MG/ML IV SOLN
INTRAVENOUS | Status: DC
  Administered 2012-01-04: 12:00:00 via INTRAVENOUS
  Administered 2012-01-04: 3.3 mg via INTRAVENOUS
  Administered 2012-01-05: 0.6 mg via INTRAVENOUS
  Administered 2012-01-05: 1.5 mg via INTRAVENOUS
  Administered 2012-01-05: 0.6 mg via INTRAVENOUS
  Administered 2012-01-05: 03:00:00 via INTRAVENOUS
  Administered 2012-01-05 – 2012-01-06 (×2): 1.2 mg via INTRAVENOUS
  Administered 2012-01-06: 0.9 mg via INTRAVENOUS
  Administered 2012-01-06: 05:00:00 via INTRAVENOUS
  Filled 2012-01-04 (×3): qty 25

## 2012-01-04 NOTE — Progress Notes (Signed)
ANTIBIOTIC CONSULT NOTE - INITIAL  Pharmacy Consult for Vancomycion Indication: Suspected graft infection; abscess  No Known Allergies  Patient Measurements: Height: 6' (182.9 cm) Weight: 246 lb 0.5 oz (111.6 kg) IBW/kg (Calculated) : 77.6  Adjusted Body Weight:   Vital Signs: Temp: 98.4 F (36.9 C) (08/06 1321) Temp src: Oral (08/06 1321) BP: 118/73 mmHg (08/06 1442) Pulse Rate: 84  (08/06 1442) Intake/Output from previous day:   Intake/Output from this shift:    Labs:  Basename 01/04/12 1212  WBC 6.7  HGB 11.7*  PLT 534*  LABCREA --  CREATININE 0.74   Estimated Creatinine Clearance: 126.7 ml/min (by C-G formula based on Cr of 0.74). No results found for this basename: VANCOTROUGH:2,VANCOPEAK:2,VANCORANDOM:2,GENTTROUGH:2,GENTPEAK:2,GENTRANDOM:2,TOBRATROUGH:2,TOBRAPEAK:2,TOBRARND:2,AMIKACINPEAK:2,AMIKACINTROU:2,AMIKACIN:2, in the last 72 hours   Microbiology: Recent Results (from the past 720 hour(s))  URINE CULTURE     Status: Normal   Collection Time   12/21/11  1:16 PM      Component Value Range Status Comment   Colony Count NO GROWTH   Final    Organism ID, Bacteria NO GROWTH   Final   CULTURE, BLOOD (ROUTINE X 2)     Status: Normal   Collection Time   12/25/11 11:05 AM      Component Value Range Status Comment   Specimen Description BLOOD RIGHT ARM   Final    Special Requests BOTTLES DRAWN AEROBIC ONLY 8CC   Final    Culture  Setup Time 12/25/2011 19:29   Final    Culture NO GROWTH 5 DAYS   Final    Report Status 12/31/2011 FINAL   Final   CULTURE, BLOOD (ROUTINE X 2)     Status: Normal   Collection Time   12/25/11 11:15 AM      Component Value Range Status Comment   Specimen Description BLOOD RIGHT HAND   Final    Special Requests BOTTLES DRAWN AEROBIC ONLY [facility]   Final    Culture  Setup Time 12/25/2011 19:29   Final    Culture NO GROWTH 5 DAYS   Final    Report Status 12/31/2011 FINAL   Final   CULTURE, BLOOD (ROUTINE X 2)     Status: Normal   Collection Time   12/26/11  2:30 PM      Component Value Range Status Comment   Specimen Description BLOOD RIGHT ARM   Final    Special Requests BOTTLES DRAWN AEROBIC ONLY [facility]   Final    Culture  Setup Time 12/26/2011 18:09   Final    Culture NO GROWTH 5 DAYS   Final    Report Status 01/01/2012 FINAL   Final   CULTURE, BLOOD (ROUTINE X 2)     Status: Normal   Collection Time   12/26/11  2:40 PM      Component Value Range Status Comment   Specimen Description BLOOD RIGHT HAND   Final    Special Requests BOTTLES DRAWN AEROBIC ONLY 4CC   Final    Culture  Setup Time 12/26/2011 18:09   Final    Culture NO GROWTH 5 DAYS   Final    Report Status 01/01/2012 FINAL   Final     Medical History: Past Medical History  Diagnosis Date  . AAA (abdominal aortic aneurysm)   . Dyslipidemia   . Left lumbar radiculopathy   . Hyperplasia of prostate without lower urinary tract symptoms (LUTS)   . Other and unspecified hyperlipidemia   . Hx of adenomatous colonic polyps 2008  . Depression   .  HTN (hypertension)     unspecified essential  . CAD (coronary artery disease)     DES to RCA and CFX; Dr. Excell Seltzer cardiologist    Medications:  Prescriptions prior to admission  Medication Sig Dispense Refill  . acetaminophen (TYLENOL) 500 MG tablet Take 1,000 mg by mouth every 4 (four) hours as needed. For pain      . aspirin 81 MG tablet Take 81 mg by mouth at bedtime.       Marland Kitchen atorvastatin (LIPITOR) 10 MG tablet Take 10 mg by mouth at bedtime.       . ciprofloxacin (CIPRO) 500 MG tablet Take 1 tablet (500 mg total) by mouth 2 (two) times daily.  84 tablet  0  . clopidogrel (PLAVIX) 75 MG tablet Take 75 mg by mouth every morning.       . Heparin Sodium, Porcine, (SASH KIT IJ) Inject as directed.      Marland Kitchen HYDROcodone-acetaminophen (NORCO/VICODIN) 5-325 MG per tablet Take 1 tablet by mouth every 4 (four) hours as needed. For paim  30 tablet  0  . losartan-hydrochlorothiazide (HYZAAR) 100-25 MG per tablet Take 1  tablet by mouth at bedtime.       . metoprolol succinate (TOPROL-XL) 100 MG 24 hr tablet Take 100 mg by mouth at bedtime. Take with or immediately following a meal.      . metroNIDAZOLE (FLAGYL) 500 MG tablet Take 1 tablet (500 mg total) by mouth 3 (three) times daily.  126 tablet  0  . Multiple Vitamin (MULTIVITAMIN WITH MINERALS) TABS Take 1 tablet by mouth every morning.      . rifampin (RIFADIN) 300 MG capsule Take 1 capsule (300 mg total) by mouth every 12 (twelve) hours.  84 capsule  0  . sodium chloride 0.9 % SOLN 500 mL with vancomycin 1000 MG SOLR 1,500 mg Inject 1,500 mg into the vein every 12 (twelve) hours.  84 application  0   Assessment: 60yom admitted for worsening flank and groin pain to continue antibiotics for possible graft infection and abscess. Patient started Vancomycin during previous hospitalization on 7/28 and continued as outpatient. Vancomycin dose was increased to 1.5g IV q12h on 7/31 due to subtherapeutic level (12.7) - regimen was continued and patient reports receiving most recent dose this AM ~0800. Will plan to check Vancomycin trough this evening prior to next scheduled dose. Patient will also have Flagyl, Rifampin and Cipro continued. - Afebrile, WBC wnl - CrCl >100 - Wt: 112kg  Goal of Therapy:  Vancomycin trough level 15-20 mcg/ml  Plan:  1. Vancomycin trough @ 1900 tonight - if therapeutic, will continue Vancomycin 1.5g IV q12h 2. Continue Cipro 500mg  po q12h, Rifampin 300mg  po q12h, and Flagyl 500mg  q8h - doses appropriate 3. Monitor renal function, UOP and vascular plan  Waylin, Dorko [phone] 01/04/2012,2:42 PM

## 2012-01-04 NOTE — Progress Notes (Signed)
ANTIBIOTIC CONSULT NOTE - Follow up  Pharmacy Consult for Vancomycion Indication: Suspected graft infection; abscess   Labs:  [facility] 01/04/12 1212  WBC 6.7  HGB 11.7*  PLT 534*  LABCREA --  CREATININE 0.74   Estimated Creatinine Clearance: 126.7 ml/min (by C-G formula based on Cr of 0.74).  Basename 01/04/12 2009  VANCOTROUGH 10.0  VANCOPEAK --  VANCORANDOM --  GENTTROUGH --  GENTPEAK --  GENTRANDOM --  TOBRATROUGH --  TOBRAPEAK --  TOBRARND --  AMIKACINPEAK --  AMIKACINTROU --  AMIKACIN --     Assessment: 60yom admitted for worsening flank and groin pain to continue antibiotics for possible graft infection and abscess. Patient started Vancomycin during previous hospitalization on 7/28 and continued as outpatient. Vancomycin dose was increased to 1.5g IV q12h on 7/31 due to subtherapeutic level (12.7) - regimen was continued and patient reports receiving most recent dose this AM ~0800. Patient will also have Flagyl, Rifampin and Cipro continued. Vancomycin trough still low at 10 mg/L.  Goal of Therapy:  Vancomycin trough level 15-20 mcg/ml  Plan:  1. Change vancomycin to 1g IV q8h aiming for a goal trough of 15-20 2. Continue Cipro 500mg  po q12h, Rifampin 300mg  po q12h, and Flagyl 500mg  q8h - doses appropriate 3. Monitor renal function, UOP and vascular plan  Mickeal Skinner [phone] 01/04/2012,9:37 PM

## 2012-01-04 NOTE — Progress Notes (Signed)
PA on-call Doreatha Massed notified of critical lab result. At 1425. RN received call back at 1430. New orders given. Patient in procedure at this time.    Ostin Mathey Keefe,RN,BSN

## 2012-01-04 NOTE — Progress Notes (Signed)
Patient returned from Radiology, band-aide on left side of back. No drainage noted. Patient placed on monitor.   Denis Koppel Goulart,RN,BSN

## 2012-01-04 NOTE — Procedures (Signed)
Procedure:  CT guided aspiration of left retroperitoneal abscess Findings:  Very little liquified material in left retroperitoneal process.  With saline lavage, 4 mL of turbid fluid able to be aspirated. Sent for culture studies.

## 2012-01-04 NOTE — H&P (Addendum)
[facility]  HISTORY AND PHYSICAL   History of Present Illness:  Patient is a 61 y.o. year old male who presents for evaluation of worsening left flank and groin pain with know periaortic fluid collection and possible stent graft infection.  He was recently admitted and placed on IV antibiotics.  He had a repeat CT yesterday which showed increase in the fluid collection.  He is requiring frequent narcotic for pain control.  Intermittent fever 99-100.  Other medical problems include hypertension, CAD.  Past Medical History  Diagnosis Date  . AAA (abdominal aortic aneurysm)   . Dyslipidemia   . Left lumbar radiculopathy   . Hyperplasia of prostate without lower urinary tract symptoms (LUTS)   . Other and unspecified hyperlipidemia   . Hx of adenomatous colonic polyps 2008  . Depression   . HTN (hypertension)     unspecified essential  . CAD (coronary artery disease)     DES to RCA and CFX; Dr. Excell Seltzer cardiologist    Past Surgical History  Procedure Date  . Cardiac catheterization 2005  . Appendectomy     age 46  . Colonoscopy w/ polypectomy 2008    adenomatous  & hyperplastic polyp;due 2013  . Cystoscopy 2005    varicose veins in bladder, Dr Retta Diones  . Coronary artey stent 2002 & 2005  . Tonsillectomy and adenoidectomy     removed at age 23  . Artery repair 09/06/2011    Procedure: ARTERY REPAIR;  Surgeon: Sherren Kerns, MD;  Location: [facility] OR;  Service: Vascular;  Laterality: Bilateral;     Social History History  Substance Use Topics  . Smoking status: Former Smoker -- 1.0 packs/day for 27 years    Quit date: 05/31/1993  . Smokeless tobacco: Current User    Types: Snuff  . Alcohol Use: 12.6 oz/week    21 Shots of liquor per week     2-3 drinks of Vodka a night, occasional beer    Family History Family History  Problem Relation Age of Onset  . Coronary artery disease Father     CABG in 35s  . Diabetes Father   . Colon polyps Father     . Heart attack Father     ?  Marland Kitchen Cancer Father     non-small cell carcinoma/bone CA  . Hypertension Father   . Heart disease Father   . Coronary artery disease Maternal Grandfather   . Coronary artery disease Paternal Grandfather   . Other Mother     varicose veins  . Anesthesia problems Neg Hx     Allergies  No Known Allergies   Current Facility-Administered Medications  Medication Dose Route Frequency Provider Last Rate Last Dose  . diphenhydrAMINE (BENADRYL) injection 12.5 mg  12.5 mg Intravenous Q6H PRN Sherren Kerns, MD       Or  . diphenhydrAMINE (BENADRYL) 12.5 MG/5ML elixir 12.5 mg  12.5 mg Oral Q6H PRN Sherren Kerns, MD      . HYDROmorphone (DILAUDID) PCA injection 0.3 mg/mL   Intravenous Q4H Sherren Kerns, MD      . naloxone [facility]) injection 0.4 mg  0.4 mg Intravenous PRN Sherren Kerns, MD       And  . sodium chloride 0.9 % injection 9 mL  9 mL Intravenous PRN Sherren Kerns, MD      . ondansetron [facility]) injection 4 mg  4 mg Intravenous Q6H PRN Sherren Kerns, MD  Facility-Administered Medications Ordered in Other Encounters  Medication Dose Route Frequency Provider Last Rate Last Dose  . iohexol (OMNIPAQUE) 350 MG/ML injection 100 mL  100 mL Intravenous Once PRN Medication Radiologist, MD   100 mL at 01/03/12 1503  . iohexol (OMNIPAQUE) 350 MG/ML injection 100 mL  100 mL Intravenous Once PRN Medication Radiologist, MD       Current Facility-Administered Medications on File Prior to Encounter  Medication Dose Route Frequency Provider Last Rate Last Dose  . iohexol (OMNIPAQUE) 350 MG/ML injection 100 mL  100 mL Intravenous Once PRN Medication Radiologist, MD   100 mL at 01/03/12 1503  . iohexol (OMNIPAQUE) 350 MG/ML injection 100 mL  100 mL Intravenous Once PRN Medication Radiologist, MD       Current Outpatient Prescriptions on File Prior to Encounter  Medication Sig Dispense Refill  . acetaminophen (TYLENOL) 500 MG tablet Take 1,000 mg by  mouth every 4 (four) hours as needed. For pain      . aspirin 81 MG tablet Take 81 mg by mouth at bedtime.       Marland Kitchen atorvastatin (LIPITOR) 10 MG tablet Take 10 mg by mouth at bedtime.       . ciprofloxacin (CIPRO) 500 MG tablet Take 1 tablet (500 mg total) by mouth 2 (two) times daily.  84 tablet  0  . clopidogrel (PLAVIX) 75 MG tablet Take 75 mg by mouth every morning.       . Heparin Sodium, Porcine, (SASH KIT IJ) Inject as directed.      Marland Kitchen HYDROcodone-acetaminophen (NORCO/VICODIN) 5-325 MG per tablet Take 1 tablet by mouth every 4 (four) hours as needed. For paim  30 tablet  0  . losartan-hydrochlorothiazide (HYZAAR) 100-25 MG per tablet Take 1 tablet by mouth at bedtime.       . metoprolol succinate (TOPROL-XL) 100 MG 24 hr tablet Take 100 mg by mouth at bedtime. Take with or immediately following a meal.      . metroNIDAZOLE (FLAGYL) 500 MG tablet Take 1 tablet (500 mg total) by mouth 3 (three) times daily.  126 tablet  0  . Multiple Vitamin (MULTIVITAMIN WITH MINERALS) TABS Take 1 tablet by mouth every morning.      . rifampin (RIFADIN) 300 MG capsule Take 1 capsule (300 mg total) by mouth every 12 (twelve) hours.  84 capsule  0  . sodium chloride 0.9 % SOLN 500 mL with vancomycin 1000 MG SOLR 1,500 mg Inject 1,500 mg into the vein every 12 (twelve) hours.  84 application  0    ROS:   General:  No weight loss, Fever, chills  HEENT: No recent headaches, no nasal bleeding, no visual changes, no sore throat  Neurologic: No dizziness, blackouts, seizures. No recent symptoms of stroke or mini- stroke. No recent episodes of slurred speech, or temporary blindness.  Cardiac: No recent episodes of chest pain/pressure, no shortness of breath at rest.  No shortness of breath with exertion.  Denies history of atrial fibrillation or irregular heartbeat  Vascular: No history of rest pain in feet.  No history of claudication.  No history of non-healing ulcer, No history of DVT   Pulmonary: No home  oxygen, no productive cough, no hemoptysis,  No asthma or wheezing  Musculoskeletal:  [ ]  Arthritis, [x ] Low back pain,  [ ]  Joint pain  Hematologic:No history of hypercoagulable state.  No history of easy bleeding.  No history of anemia  Gastrointestinal: No hematochezia or melena,  No gastroesophageal  reflux, no trouble swallowing  Urinary: [ ]  chronic Kidney disease, [ ]  on HD - [ ]  MWF or [ ]  TTHS, [ ]  Burning with urination, [ ]  Frequent urination, [ ]  Difficulty urinating;   Skin: No rashes  Psychological: No history of anxiety,  No history of depression   Physical Examination  Filed Vitals:   01/04/12 1140  BP: 103/70  Pulse: 74  Temp: 98 F (36.7 C)  TempSrc: Oral  Resp: 18  Height: 6' (1.829 m)  Weight: 246 lb 0.5 oz (111.6 kg)  SpO2: 99%    Body mass index is 33.37 kg/(m^2).  General:  Alert and oriented, no acute distress HEENT: Normal Neck: No bruit or JVD Pulmonary: Clear to auscultation bilaterally Cardiac: Regular Rate and Rhythm without murmur Abdomen: Soft, non-tender, non-distended, no pulsatile mass, mild left lower quadrant tenderness Skin: No rash Extremity Pulses:  2+ radial, brachial, femoral bilaterally Musculoskeletal: No deformity or edema  Neurologic: Upper and lower extremity motor 5/5 and symmetric   ASSESSMENT: Possible infected excluder graft   PLAN:  CT guided aspiration of fluid collection today for symptomatic relief and culture Dilaudid PCA May need graft removal. Quoted pt at least 10% mortality if graft needs removal  Discussed with pt current situation.  Dr Myra Gianotti will be following pt while I am out of town.  Fabienne Bruns, MD [facility] [place] Office: [phone] Pager: [phone]

## 2012-01-04 NOTE — Progress Notes (Signed)
CRITICAL VALUE ALERT  Critical value received:  Potassium 2.7  Date of notification:  01/04/12  Time of notification:  1410  Critical value read back:yes  Nurse who received alert:  August Saucer   MD notified (1st page):   Time of first page:   MD notified (2nd page):  Time of second page:  Responding MD:   Time MD responded:

## 2012-01-05 DIAGNOSIS — I739 Peripheral vascular disease, unspecified: Secondary | ICD-10-CM

## 2012-01-05 DIAGNOSIS — T827XXA Infection and inflammatory reaction due to other cardiac and vascular devices, implants and grafts, initial encounter: Secondary | ICD-10-CM

## 2012-01-05 DIAGNOSIS — Y832 Surgical operation with anastomosis, bypass or graft as the cause of abnormal reaction of the patient, or of later complication, without mention of misadventure at the time of the procedure: Secondary | ICD-10-CM

## 2012-01-05 LAB — CBC
Hemoglobin: 10.9 g/dL — ABNORMAL LOW (ref 13.0–17.0)
Platelets: 483 10*3/uL — ABNORMAL HIGH (ref 150–400)
RBC: 3.45 MIL/uL — ABNORMAL LOW (ref 4.22–5.81)
WBC: 6.2 10*3/uL (ref 4.0–10.5)

## 2012-01-05 LAB — BASIC METABOLIC PANEL
CO2: 31 mEq/L (ref 19–32)
Chloride: 95 mEq/L — ABNORMAL LOW (ref 96–112)
Glucose, Bld: 106 mg/dL — ABNORMAL HIGH (ref 70–99)
Sodium: 134 mEq/L — ABNORMAL LOW (ref 135–145)

## 2012-01-05 MED ORDER — DEXTROSE 5 % IV SOLN
2.0000 g | Freq: Three times a day (TID) | INTRAVENOUS | Status: DC
  Administered 2012-01-05 – 2012-01-10 (×15): 2 g via INTRAVENOUS
  Filled 2012-01-05 (×17): qty 2

## 2012-01-05 MED ORDER — ACETAMINOPHEN 325 MG PO TABS
650.0000 mg | ORAL_TABLET | Freq: Four times a day (QID) | ORAL | Status: DC | PRN
  Administered 2012-01-05 – 2012-01-09 (×9): 650 mg via ORAL
  Filled 2012-01-05 (×9): qty 2

## 2012-01-05 NOTE — H&P (Signed)
[facility]  Total days of antibiotics :10        Day 10: vancomycin        Day 10: rifampin        Day 10: metronidazole                                                                                     Day 10: Ciprofloxacin       Reason for Consult: Possible aortic grft infection and abscess  Referring Physician: FIELDS, C MD   . aspirin  81 mg Oral QHS  . atorvastatin  10 mg Oral QHS  . cefTAZidime (FORTAZ)  IV  2 g Intravenous Q8H  . clopidogrel  75 mg Oral q morning - 10a  . fentaNYL      . hydrochlorothiazide  25 mg Oral Daily  . HYDROmorphone PCA 0.3 mg/mL   Intravenous Q4H  . losartan  100 mg Oral Daily  . metoprolol succinate  100 mg Oral QHS  . metroNIDAZOLE  500 mg Oral TID  . midazolam      . multivitamin with minerals  1 tablet Oral q morning - 10a  . pantoprazole  40 mg Oral Q1200  . potassium chloride  20 mEq Oral TID  . potassium chloride  20-40 mEq Oral Once  . rifampin  300 mg Oral Q12H  . vancomycin  1,000 mg Intravenous Q8H  . DISCONTD: aspirin  81 mg Oral QHS  . DISCONTD: ciprofloxacin  500 mg Oral BID  . DISCONTD: losartan-hydrochlorothiazide  1 tablet Oral QHS  . DISCONTD: potassium chloride  20 mEq Oral TID  . DISCONTD: vancomycin (VANCOCIN) 1500 mg IVPB  1,500 mg Intravenous Q12H  . DISCONTD: vancomycin  1,500 mg Intravenous Q12H   HPI: Alfred [name] is a 61 y.o. male  s/p AAA repair through bilateral groin cutdown on 09/06/2011 p/w worsening left flank pain, radiating to groin. Pt was fine after surgery but feeling down as well as GI issues on July 26 and had Ct Abdomen Pelvis, and showed periaortic infection and  3 x 5.2 cm  fluid collection at the left posterolateral aspect of the aortic endo graft. 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 Repeat CT on 8/5 and shows progression of periaortic infection with increase in size 6.1 x  5.4 cm abscess. CT guided aspiration of 4 ml of turbid fluid collected yesterday and sent for culture. Pt is feeling better today.Tmax 999.  Pt denies n/v, chest pain, cough, rashes, SOB, abdominal pain,diarrhea or dysuria.   Review of Systems: Pertinent items are noted in HPI.  Past Medical History  Diagnosis Date  . AAA (abdominal aortic aneurysm)   . Dyslipidemia   . Left lumbar radiculopathy   . Hyperplasia of prostate without lower urinary tract symptoms (LUTS)   . Other and unspecified hyperlipidemia   . Hx of adenomatous colonic polyps 2008  . Depression   . HTN (hypertension)     unspecified essential  . CAD (coronary artery disease)     DES to RCA and CFX; Dr. Excell Seltzer cardiologist  History  Substance Use Topics  . Smoking status: Former Smoker -- 1.0 packs/day for 27 years    Quit date: 05/31/1993  . Smokeless tobacco: Current User    Types: Snuff  . Alcohol Use: 12.6 oz/week    21 Shots of liquor per week     2-3 drinks of Vodka a night, occasional beer    Family History  Problem Relation Age of Onset  . Coronary artery disease Father     CABG in 82s  . Diabetes Father   . Colon polyps Father   . Heart attack Father     ?  Marland Kitchen Cancer Father     non-small cell carcinoma/bone CA  . Hypertension Father   . Heart disease Father   . Coronary artery disease Maternal Grandfather   . Coronary artery disease Paternal Grandfather   . Other Mother     varicose veins  . Anesthesia problems Neg Hx    No Known Allergies  OBJECTIVE: Blood pressure 103/64, pulse 77, temperature 98.7 F (37.1 C), temperature source Oral, resp. rate 18, height 6' (1.829 m), weight 111.6 kg (246 lb 0.5 oz), SpO2 98.00%.  General: resting in bed HEENT: PERRL, EOMI, no scleral icterus Cardiac: RRR, no rubs, murmurs or gallops Pulm: clear to auscultation bilaterally, moving normal volumes of air Abd: soft, nontender, nondistended, BS present Ext: warm and well perfused, no pedal edema,  Rt sided PICC line , no redness.  Neuro: alert and oriented X3, cranial nerves II-XII grossly intact. Some numbness in left groin  Microbiology: Recent Results (from the past 240 hour(s))  CULTURE, BLOOD (ROUTINE X 2)     Status: Normal   Collection Time   12/26/11  2:30 PM      Component Value Range Status Comment   Specimen Description BLOOD RIGHT ARM   Final    Special Requests BOTTLES DRAWN AEROBIC ONLY [facility]   Final    Culture  Setup Time 12/26/2011 18:09   Final    Culture NO GROWTH 5 DAYS   Final    Report Status 01/01/2012 FINAL   Final   CULTURE, BLOOD (ROUTINE X 2)     Status: Normal   Collection Time   12/26/11  2:40 PM      Component Value Range Status Comment   Specimen Description BLOOD RIGHT HAND   Final    Special Requests BOTTLES DRAWN AEROBIC ONLY 4CC   Final    Culture  Setup Time 12/26/2011 18:09   Final    Culture NO GROWTH 5 DAYS   Final    Report Status 01/01/2012 FINAL   Final   CULTURE, BLOOD (SINGLE)     Status: Normal (Preliminary result)   Collection Time   01/04/12 12:12 PM      Component Value Range Status Comment   Specimen Description BLOOD ARM LEFT   Final    Special Requests BOTTLES DRAWN AEROBIC AND ANAEROBIC 10CC EACH   Final    Culture  Setup Time 01/04/2012 20:16   Final    Culture     Final    Value: GRAM NEGATIVE COCCOBACILLI     Note: Gram Stain Report Called to,Read Back By and Verified With: NEGO CROSSON @1103  01/05/12   Report Status PENDING   Incomplete   CULTURE, ROUTINE-ABSCESS     Status: Normal (Preliminary result)   Collection Time   01/04/12  2:57 PM      Component Value Range Status Comment   Specimen Description ABSCESS ASPIRATE  LEFT   Final    Special Requests ASPIRATE OF LEFT RETROPERITONEAL ABSCESS   Final    Gram Stain     Final    Value: ABUNDANT WBC PRESENT,BOTH PMN AND MONONUCLEAR     NO SQUAMOUS EPITHELIAL CELLS SEEN     NO ORGANISMS SEEN   Culture NO GROWTH 1 DAY   Final    Report Status PENDING   Incomplete     Assessment: [name] is a 61 y.o. male  s/p AAA repair through bilateral groin cutdown on 09/06/2011 p/w worsening left flank pain, radiating to groin.CT on 8/5 positive for progression of periaortic infection with abscess. CT guided aspiration of 4 ml of turbid fluid collected yesterday and sent for culture. Pt is feeling better today.Tmax 999. 1 Blood culture positive for GRAM NEGATIVE COCCOBACILLI.   Recommendations: 1. Continue vancomycin  IV 1,000 mg, IV, Q8H 2. Continue rifampin 300 mg, PO, Q12H 3. MetroNIDAZOLE 500 mg, PO, TID 4. Changed FORTAZ  IV 2 g, IV, Q8H for better gram negative coverage 5. D/C'd Ciprofloxacin   Junious Silk,  01/05/2012, 11:58 AM  INFECTIOUS DISEASE ATTENDING ADDENDUM:     [facility]   Date: 01/07/2012  Patient name: Alfred [name]  Medical record number: [id number]  Date of birth: 1950-12-14    This patient has been seen and discussed with the house staff. Please see their note for complete details. I concur with their findings with the following additions/corrections:  Still no ID on blood stream organism. Plan as above.   Dr. Drue Second will be covering this weekend and is available for questions.   Acey Lav 01/07/2012, 5:22 PM

## 2012-01-05 NOTE — Progress Notes (Signed)
ANTIBIOTIC CONSULT NOTE - INITIAL  Pharmacy Consult for Ceftazidime Indication: Suspected graft infection; abscess  No Known Allergies  Patient Measurements: Height: 6' (182.9 cm) Weight: 246 lb 0.5 oz (111.6 kg) IBW/kg (Calculated) : 77.6  Adjusted Body Weight:   Vital Signs: Temp: 98.7 F (37.1 C) (08/07 0800) Temp src: Oral (08/07 0800) BP: 103/64 mmHg (08/07 0800) Pulse Rate: 77  (08/07 0800) Intake/Output from previous day: 08/06 0701 - 08/07 0700 In: 480 [P.O.:480] Out: 200 [Urine:200] Intake/Output from this shift:    Labs:  Basename 01/05/12 0519 01/04/12 1212  WBC 6.2 6.7  HGB 10.9* 11.7*  PLT 483* 534*  LABCREA -- --  CREATININE 0.76 0.74   Estimated Creatinine Clearance: 126.7 ml/min (by C-G formula based on Cr of 0.76).  Basename 01/04/12 2009  VANCOTROUGH 10.0  VANCOPEAK --  Drue Dun --  GENTTROUGH --  GENTPEAK --  GENTRANDOM --  TOBRATROUGH --  TOBRAPEAK --  TOBRARND --  AMIKACINPEAK --  AMIKACINTROU --  AMIKACIN --     Microbiology: Recent Results (from the past 720 hour(s))  URINE CULTURE     Status: Normal   Collection Time   12/21/11  1:16 PM      Component Value Range Status Comment   Colony Count NO GROWTH   Final    Organism ID, Bacteria NO GROWTH   Final   CULTURE, BLOOD (ROUTINE X 2)     Status: Normal   Collection Time   12/25/11 11:05 AM      Component Value Range Status Comment   Specimen Description BLOOD RIGHT ARM   Final    Special Requests BOTTLES DRAWN AEROBIC ONLY 8CC   Final    Culture  Setup Time 12/25/2011 19:29   Final    Culture NO GROWTH 5 DAYS   Final    Report Status 12/31/2011 FINAL   Final   CULTURE, BLOOD (ROUTINE X 2)     Status: Normal   Collection Time   12/25/11 11:15 AM      Component Value Range Status Comment   Specimen Description BLOOD RIGHT HAND   Final    Special Requests BOTTLES DRAWN AEROBIC ONLY [facility]   Final    Culture  Setup Time 12/25/2011 19:29   Final    Culture NO GROWTH 5 DAYS    Final    Report Status 12/31/2011 FINAL   Final   CULTURE, BLOOD (ROUTINE X 2)     Status: Normal   Collection Time   12/26/11  2:30 PM      Component Value Range Status Comment   Specimen Description BLOOD RIGHT ARM   Final    Special Requests BOTTLES DRAWN AEROBIC ONLY [facility]   Final    Culture  Setup Time 12/26/2011 18:09   Final    Culture NO GROWTH 5 DAYS   Final    Report Status 01/01/2012 FINAL   Final   CULTURE, BLOOD (ROUTINE X 2)     Status: Normal   Collection Time   12/26/11  2:40 PM      Component Value Range Status Comment   Specimen Description BLOOD RIGHT HAND   Final    Special Requests BOTTLES DRAWN AEROBIC ONLY 4CC   Final    Culture  Setup Time 12/26/2011 18:09   Final    Culture NO GROWTH 5 DAYS   Final    Report Status 01/01/2012 FINAL   Final   CULTURE, BLOOD (SINGLE)     Status: Normal (Preliminary  result)   Collection Time   01/04/12 12:12 PM      Component Value Range Status Comment   Specimen Description BLOOD ARM LEFT   Final    Special Requests BOTTLES DRAWN AEROBIC AND ANAEROBIC 10CC EACH   Final    Culture  Setup Time 01/04/2012 20:16   Final    Culture     Final    Value: GRAM NEGATIVE COCCOBACILLI     Note: Gram Stain Report Called to,Read Back By and Verified With: NEGO CROSSON @1103  01/05/12   Report Status PENDING   Incomplete   CULTURE, ROUTINE-ABSCESS     Status: Normal (Preliminary result)   Collection Time   01/04/12  2:57 PM      Component Value Range Status Comment   Specimen Description ABSCESS ASPIRATE LEFT   Final    Special Requests ASPIRATE OF LEFT RETROPERITONEAL ABSCESS   Final    Gram Stain     Final    Value: ABUNDANT WBC PRESENT,BOTH PMN AND MONONUCLEAR     NO SQUAMOUS EPITHELIAL CELLS SEEN     NO ORGANISMS SEEN   Culture NO GROWTH 1 DAY   Final    Report Status PENDING   Incomplete     Medical History: Past Medical History  Diagnosis Date  . AAA (abdominal aortic aneurysm)   . Dyslipidemia   . Left lumbar radiculopathy   .  Hyperplasia of prostate without lower urinary tract symptoms (LUTS)   . Other and unspecified hyperlipidemia   . Hx of adenomatous colonic polyps 2008  . Depression   . HTN (hypertension)     unspecified essential  . CAD (coronary artery disease)     DES to RCA and CFX; Dr. Excell Seltzer cardiologist   Assessment: 60yom on antibiotics for suspected graft infection and abscess to have gram negative coverage changed from Cipro to Ceftazidime due to gram negative coccobacilli reported in blood cultures. Patient is currently afebrile and WBC wnl. Patient is also receiving Vancomycin, Rifampin and Flagyl. - Wt: 112kg - CrCl > 158ml/min  Plan:  1. Ceftazidime 2g IV q8h 2. Monitor renal function, clinical course, cultures and narrow when able  Diron, Haddon [phone] 01/05/2012,11:48 AM

## 2012-01-05 NOTE — Progress Notes (Addendum)
Was called by RN regarding + blood cultures with GRAM NEGATIVE COCCOBACILLI.  Spoke with Dr. Myra Gianotti and he doesn't feel we need to remove his graft emergently.  The pt's symptoms are improved, his WBC is normal and temps are low grade.  We will call ID and inform them that the pt is here and the results of the blood cultures.   Doreatha Massed 01/05/2012 11:30 AM   Consulted Dr. Daiva Eves with ID, he will see the pt later today, but advised to discontinue Cipro and start Fortaz per pharmacy for gram negative coverage.  This has been done.  Doreatha Massed 01/05/2012 11:36 AM

## 2012-01-05 NOTE — Consult Note (Signed)
Infectious Disease Attending Note Date: 01/05/2012  Patient name: Alfred [name]  Medical record number: [id number]  Date of birth: [birth year]    This patient has been seen and discussed with the house staff. Please see their note for complete details. I concur with their findings with the following additions/corrections:  61 year old with AAA graft infection with enlarging abscess despite broad spectrum vancomycin, ciprofloxacin, flagyl, and rifampin  now sp drainage by IR. He is also growing GN cocco bacillus growing from blood from admission.  Patient has been broadened from cipro to ceftazidime, along with continued vancomycin, rifampin and flagyl.  The gram negative coccobacillus could be related tot he AAA infection, or it could be due to PICC line associated bacteremia, or possibly a contaminant (only one sight was cultured)  Continue current reigmen and fu blood and abscess cultures. Will consider how to possibly consolidate parts of his regimen as data comes back.    Acey Lav 01/05/2012, 8:11 PM

## 2012-01-05 NOTE — Care Management Note (Unsigned)
    Page 1 of 1   01/05/2012     11:17:05 AM   CARE MANAGEMENT NOTE 01/05/2012  Patient:  Alfred [name]   Account Number:  [id number]  Date Initiated:  01/05/2012  Documentation initiated by:  Alfred [name]  Subjective/Objective Assessment:   RE-ADMITTED WITH PERITONEAL ABSCESS; LIVES AT HOME WITH WIFE- LARESA; WAS IPTA- WORKS AS A FARMER IN [place]; USES [facility] IN [place] FOR RX; ACTIVE WITH [facility] FOR HHRN- IV ABX.     Action/Plan:   DISCHARGE PLANNING DISCUSSED AT BEDSIDE.   Anticipated DC Date:  01/06/2012   Anticipated DC Plan:  HOME W HOME HEALTH SERVICES      DC Planning Services  CM consult      [facility] Choice  Resumption Of Svcs/PTA Provider   Choice offered to / List presented to:  C-1 Patient        HH arranged  HH-1 RN      [facility] agency  [facility]   Status of service:  In process, will continue to follow Medicare Important Message given?   (If response is "NO", the following Medicare IM given date fields will be blank) Date Medicare IM given:   Date Additional Medicare IM given:    Discharge Disposition:    Per UR Regulation:  Reviewed for med. necessity/level of care/duration of stay  If discussed at Long Length of Stay Meetings, dates discussed:    Comments:  01/05/12  1116  Alfred Markov SIMMONS RN, BSN [phone] NCM WILL FOLLOW.

## 2012-01-05 NOTE — Progress Notes (Addendum)
[facility] Progress Note  01/05/2012 7:45 AM HD 1  Subjective:  States he feels much better since the aspiration and pain is much less.  States his abdominal distension is much less than it has been over the past 2 weeks.  Tm 99.2 now afebrile  VSS   96%2LO2NC Filed Vitals:   01/05/12 0423  BP: 107/65  Pulse: 67  Temp: 98.4 F (36.9 C)  Resp: 18    Physical Exam: Cardiac:  RRR Lungs:  CTAB Abdomen:  Soft; ND; NT; denies N/V Extremities:  No edema  CBC    Component Value Date/Time   WBC 6.2 01/05/2012 0519   RBC 3.45* 01/05/2012 0519   HGB 10.9* 01/05/2012 0519   HCT 31.9* 01/05/2012 0519   PLT 483* 01/05/2012 0519   MCV 92.5 01/05/2012 0519   MCH 31.6 01/05/2012 0519   MCHC 34.2 01/05/2012 0519   RDW 12.8 01/05/2012 0519   LYMPHSABS 1.2 12/24/2011 1722   MONOABS 0.6 12/24/2011 1722   EOSABS 0.0 12/24/2011 1722   BASOSABS 0.0 12/24/2011 1722    BMET    Component Value Date/Time   NA 134* 01/05/2012 0519   K 3.2* 01/05/2012 0519   CL 95* 01/05/2012 0519   CO2 31 01/05/2012 0519   GLUCOSE 106* 01/05/2012 0519   GLUCOSE 96 05/26/2006 1118   BUN 9 01/05/2012 0519   CREATININE 0.76 01/05/2012 0519   CALCIUM 8.5 01/05/2012 0519   GFRNONAA >90 01/05/2012 0519   GFRAA >90 01/05/2012 0519    INR    Component Value Date/Time   INR 1.24 01/04/2012 1212     Intake/Output Summary (Last 24 hours) at 01/05/12 0745 Last data filed at 01/05/12 0051  Gross per 24 hour  Intake      0 ml  Output    200 ml  Net   -200 ml     Assessment/Plan:  61 y.o. male is  Admitted with possible worsening stent graft infection HD 1  -underwent CT guided aspiration of fluid collection, but there was minimal fluid removed - 4cc of turbid fluid was able to be aspirated.  It was sent for culture, which continues to be in process at this time.  Blood cultures drawn yesterday are still in progress as well. -continue ABx -WBC is normal and only had low grade temp in past 24 hrs -hypokalemia-receiving   K-dur tid x 4 doses -ck labs in am -will discuss plan with Dr. Vista Lawman, PA-C [facility] [phone] 01/05/2012 7:45 AM  Agree with above  Patient feels much better today.  WBC remains WNL.  Cx came back GRAM NEGATIVE COCCOBACILLI  Will ask ID to re-evaluate.  Because the patient clinically looks so good, I would not recommend taking him to the OR for explant at this time.  Obviously if he takes a turn for the worse, he will need to have his graft removed.  Due to the morbidity associated with this operation, I would like to give the patient a reasonable trial of non-operative therapy.  Durene Cal

## 2012-01-05 NOTE — Progress Notes (Signed)
RN called by [facility] lab regarding positive blood cultures (see results flowsheet) - [facility] PA notified.

## 2012-01-06 DIAGNOSIS — R7881 Bacteremia: Secondary | ICD-10-CM

## 2012-01-06 DIAGNOSIS — T8579XA Infection and inflammatory reaction due to other internal prosthetic devices, implants and grafts, initial encounter: Secondary | ICD-10-CM

## 2012-01-06 DIAGNOSIS — Y832 Surgical operation with anastomosis, bypass or graft as the cause of abnormal reaction of the patient, or of later complication, without mention of misadventure at the time of the procedure: Secondary | ICD-10-CM

## 2012-01-06 LAB — BASIC METABOLIC PANEL
CO2: 29 mEq/L (ref 19–32)
Calcium: 8.3 mg/dL — ABNORMAL LOW (ref 8.4–10.5)
Glucose, Bld: 179 mg/dL — ABNORMAL HIGH (ref 70–99)
Potassium: 3.1 mEq/L — ABNORMAL LOW (ref 3.5–5.1)
Sodium: 129 mEq/L — ABNORMAL LOW (ref 135–145)

## 2012-01-06 LAB — HIV ANTIBODY (ROUTINE TESTING W REFLEX): HIV: NONREACTIVE

## 2012-01-06 LAB — SEDIMENTATION RATE: Sed Rate: 104 mm/hr — ABNORMAL HIGH (ref 0–16)

## 2012-01-06 LAB — C-REACTIVE PROTEIN: CRP: 11.4 mg/dL — ABNORMAL HIGH (ref <0.60)

## 2012-01-06 MED ORDER — OXYCODONE HCL 5 MG PO TABS
5.0000 mg | ORAL_TABLET | ORAL | Status: DC | PRN
  Administered 2012-01-06 – 2012-01-10 (×20): 10 mg via ORAL
  Filled 2012-01-06 (×22): qty 2

## 2012-01-06 MED ORDER — MORPHINE SULFATE 2 MG/ML IJ SOLN
2.0000 mg | INTRAMUSCULAR | Status: DC | PRN
  Administered 2012-01-06 – 2012-01-10 (×13): 2 mg via INTRAVENOUS
  Filled 2012-01-06 (×14): qty 1

## 2012-01-06 NOTE — Progress Notes (Signed)
PICC line tip culture sent to lab as ordered.

## 2012-01-06 NOTE — Progress Notes (Signed)
Full dose dliaudid PCA turned off at this time.  10mg  Oxy IR given.  0.9mg  of dilaudid cleared from history prior to DC.

## 2012-01-06 NOTE — Progress Notes (Addendum)
[facility] Progress Note  01/06/2012 7:16 AM HD 2  Subjective:  Feels better today.  Feels like the new ABx is working better.  Tm 101.6  Now 98.9 Filed Vitals:   01/06/12 0437  BP:   Pulse:   Temp:   Resp: 16    Physical Exam: Cardiac:  RRR Lungs:  CTAB Abdomen:  Soft; less distended today Extremities:  No edema  CBC    Component Value Date/Time   WBC 6.2 01/05/2012 0519   RBC 3.45* 01/05/2012 0519   HGB 10.9* 01/05/2012 0519   HCT 31.9* 01/05/2012 0519   PLT 483* 01/05/2012 0519   MCV 92.5 01/05/2012 0519   MCH 31.6 01/05/2012 0519   MCHC 34.2 01/05/2012 0519   RDW 12.8 01/05/2012 0519   LYMPHSABS 1.2 12/24/2011 1722   MONOABS 0.6 12/24/2011 1722   EOSABS 0.0 12/24/2011 1722   BASOSABS 0.0 12/24/2011 1722    BMET    Component Value Date/Time   NA 134* 01/05/2012 0519   K 3.2* 01/05/2012 0519   CL 95* 01/05/2012 0519   CO2 31 01/05/2012 0519   GLUCOSE 106* 01/05/2012 0519   GLUCOSE 96 05/26/2006 1118   BUN 9 01/05/2012 0519   CREATININE 0.76 01/05/2012 0519   CALCIUM 8.5 01/05/2012 0519   GFRNONAA >90 01/05/2012 0519   GFRAA >90 01/05/2012 0519    INR    Component Value Date/Time   INR 1.24 01/04/2012 1212     Intake/Output Summary (Last 24 hours) at 01/06/12 0716 Last data filed at 01/06/12 0241  Gross per 24 hour  Intake    600 ml  Output   3595 ml  Net  -2995 ml   Abdominal fluid cx: ASPIRATE OF LEFT RETROPERITONEAL ABSCESS  Gram Stain ABUNDANT WBC PRESENT,BOTH PMN AND MONONUCLEAR NO SQUAMOUS EPITHELIAL CELLS SEEN NO ORGANISMS SEEN Culture  NO GROWTH 1 DAY  Blood Cx 01/04/12: GRAM NEGATIVE COCCOBACILLI    Assessment/Plan:  61 y.o. male is  Admitted with possible worsening stent graft infection  HD 2  -ID consulted yesterday-IV ABx changed-Cipro discontinued and Elita Quick started due to positive blood cx. -pt wants to try po pain medication and discontinue PCA -continue conservative treatment as pt is feeling better.  Doreatha Massed, PA-C [facility] [phone] 01/06/2012 7:16 AM  Agree with above Appreciate ID assistance.  Will remove PICC and Cx tip D/W pt that I still would not recommend aortic graft removeal as long as he is doing well clinically.  It will need to be removed if he takes a turn for the worse.  I will plan on repeating his CT scan on Sunday / Monday to help better guide my decision and to see if ABx therapy is working.  Durene Cal

## 2012-01-06 NOTE — Progress Notes (Signed)
[facility]    Date of Admission:  01/04/2012   Total days of antibiotics :11 Day 11: vancomycin  Day 11: rifampin  Day 11: metronidazole  Day 2: Caftazidime    . aspirin  81 mg Oral QHS  . atorvastatin  10 mg Oral QHS  . cefTAZidime (FORTAZ)  IV  2 g Intravenous Q8H  . clopidogrel  75 mg Oral q morning - 10a  . hydrochlorothiazide  25 mg Oral Daily  . losartan  100 mg Oral Daily  . metoprolol succinate  100 mg Oral QHS  . metroNIDAZOLE  500 mg Oral TID  . multivitamin with minerals  1 tablet Oral q morning - 10a  . pantoprazole  40 mg Oral Q1200  . potassium chloride  20 mEq Oral TID  . rifampin  300 mg Oral Q12H  . vancomycin  1,000 mg Intravenous Q8H  . DISCONTD: ciprofloxacin  500 mg Oral BID  . DISCONTD: HYDROmorphone PCA 0.3 mg/mL   Intravenous Q4H   Subjective: Pt is feeling better. Afebrile last night, Tmax 101.6. Some back pain but no new complain. Pt denies n/v, chest pain, cough, rashes, SOB, abdominal pain,diarrhea or dysuria  Objective: Temp:  [98.6 F (37 C)-101.6 F (38.7 C)] 98.9 F (37.2 C) (08/08 0402) Pulse Rate:  [74-85] 74  (08/08 0402) Resp:  [16-21] 16  (08/08 0437) BP: (109-128)/(69-72) 109/69 mmHg (08/08 0402) SpO2:  [94 %-99 %] 95 % (08/08 0437) Weight:  [94.711 kg (208 lb 12.8 oz)] 94.711 kg (208 lb 12.8 oz) (08/08 0023)  General: resting in bed  HEENT: PERRL, EOMI, no scleral icterus  Cardiac: RRR, no rubs, murmurs or gallops  Pulm: clear to auscultation bilaterally, moving normal volumes of air  Abd: soft, nontender, nondistended, BS present  Ext: warm and well perfused, no pedal edema, Rt arm PICC line , no redness/swelling. Neuro: alert and oriented X3, cranial nerves II-XII grossly intact. Some numbness in left groin  Lab Results Lab Results  Component Value Date   WBC 6.2 01/05/2012   HGB 10.9* 01/05/2012   HCT 31.9* 01/05/2012   MCV 92.5 01/05/2012   PLT 483* 01/05/2012    Lab Results  Component Value Date     CREATININE 0.69 01/06/2012   BUN 8 01/06/2012   NA 129* 01/06/2012   K 3.1* 01/06/2012   CL 92* 01/06/2012   CO2 29 01/06/2012    Lab Results  Component Value Date   ALT 13 01/04/2012   AST 17 01/04/2012   ALKPHOS 72 01/04/2012   BILITOT 0.5 01/04/2012      Microbiology: Recent Results (from the past 240 hour(s))  CULTURE, BLOOD (SINGLE)     Status: Normal (Preliminary result)   Collection Time   01/04/12 12:12 PM      Component Value Range Status Comment   Specimen Description BLOOD ARM LEFT   Final    Special Requests BOTTLES DRAWN AEROBIC AND ANAEROBIC 10CC [facility]   Final    Culture  Setup Time 01/04/2012 20:16   Final    Culture     Final    Value: GRAM NEGATIVE COCCOBACILLI     Note: Gram Stain Report Called to,Read Back By and Verified With: NEGO CROSSON @1103  01/05/12   Report Status PENDING   Incomplete   CULTURE, ROUTINE-ABSCESS     Status: Normal (Preliminary result)   Collection Time   01/04/12  2:57 PM      Component Value Range Status Comment  Specimen Description ABSCESS ASPIRATE LEFT   Final    Special Requests ASPIRATE OF LEFT RETROPERITONEAL ABSCESS   Final    Gram Stain     Final    Value: ABUNDANT WBC PRESENT,BOTH PMN AND MONONUCLEAR     NO SQUAMOUS EPITHELIAL CELLS SEEN     NO ORGANISMS SEEN   Culture NO GROWTH 2 DAYS   Final    Report Status PENDING   Incomplete   ANAEROBIC CULTURE     Status: Normal (Preliminary result)   Collection Time   01/04/12  2:57 PM      Component Value Range Status Comment   Specimen Description ASCITIC ASPIRATE LEFT   Final    Special Requests ASPIRATE OF LEFT RETROPERITONEAL ABSCESS   Final    Gram Stain PENDING   Incomplete    Culture     Final    Value: NO ANAEROBES ISOLATED; CULTURE IN PROGRESS FOR 5 DAYS   Report Status PENDING   Incomplete   CULTURE, BLOOD (ROUTINE X 2)     Status: Normal (Preliminary result)   Collection Time   01/05/12  3:39 PM      Component Value Range Status Comment   Specimen Description BLOOD ARM LEFT   Final     Special Requests BOTTLES DRAWN AEROBIC AND ANAEROBIC 10CC   Final    Culture  Setup Time 01/05/2012 22:13   Final    Culture     Final    Value:        BLOOD CULTURE RECEIVED NO GROWTH TO DATE CULTURE WILL BE HELD FOR 5 DAYS BEFORE ISSUING A FINAL NEGATIVE REPORT   Report Status PENDING   Incomplete   CULTURE, BLOOD (ROUTINE X 2)     Status: Normal (Preliminary result)   Collection Time   01/05/12  3:45 PM      Component Value Range Status Comment   Specimen Description BLOOD HAND LEFT   Final    Special Requests BOTTLES DRAWN AEROBIC ONLY 1CC   Final    Culture  Setup Time 01/05/2012 22:13   Final    Culture     Final    Value:        BLOOD CULTURE RECEIVED NO GROWTH TO DATE CULTURE WILL BE HELD FOR 5 DAYS BEFORE ISSUING A FINAL NEGATIVE REPORT   Report Status PENDING   Incomplete     Studies/Results: Ct Aspiration  01/04/2012  IMPRESSION: CT guided aspiration of the left retroperitoneal process did not initially yield a significant amount of free fluid.  Ultimately, 4 ml of turbid fluid suspicious for developing abscess was able to be removed after guide wire advancement and sterile saline lavage. This fluid sample was sent for culture analysis.    Assessment: YASSIN SCALES is a 61 y.o. male s/p AAA repair through bilateral groin cutdown on 09/06/2011 p/w worsening left flank pain, radiating to groin.CT on 8/5 positive for progression of periaortic infection with abscess. CT guided aspiration of 4 ml of turbid fluid collected on 01/04/12 and sent for culture. Pt is feeling better today. 1 Blood culture positive for GRAM NEGATIVE COCCOBACILLI. D/C'd Ciprofloxacin and started ceftazidime for better gram negative coverage.Possible of source of bacteriemia, PICC line or Aortic graft infection or contaminated. Pending abscess culture.  Plan: 1. Continue vancomycin IV 1,000 mg, IV, Q8H  2. Continue rifampin 300 mg, PO, Q12H  3. MetroNIDAZOLE 500 mg, PO, TID  4. Continue FORTAZ IV 2 g, IV, Q8H  Junious Silk,  01/06/2012, 10:22 AM   INFECTIOUS DISEASE ATTENDING ADDENDUM:     [facility]   Date: 01/06/2012  Patient name: LIJAH BOURQUE  Medical record number: [id number]  Date of birth: 1950-09-20    This patient has been seen and discussed with the house staff. Please see their note for complete details. I concur with their findings with the following additions/corrections:  Patient's gram negative cocco-bacillus still not yet Identified, no growth from abdominal abscess yet.  Need to remove PICC line and culture tip  Place peripheral iv  Acey Lav 01/06/2012, 5:10 PM

## 2012-01-07 LAB — CBC
HCT: 35.7 % — ABNORMAL LOW (ref 39.0–52.0)
Hemoglobin: 11.9 g/dL — ABNORMAL LOW (ref 13.0–17.0)
MCH: 30.6 pg (ref 26.0–34.0)
MCHC: 33.3 g/dL (ref 30.0–36.0)
MCV: 91.8 fL (ref 78.0–100.0)
Platelets: 488 10*3/uL — ABNORMAL HIGH (ref 150–400)
RBC: 3.89 MIL/uL — ABNORMAL LOW (ref 4.22–5.81)
RDW: 12.8 % (ref 11.5–15.5)
WBC: 6.6 10*3/uL (ref 4.0–10.5)

## 2012-01-07 LAB — BASIC METABOLIC PANEL
Chloride: 94 mEq/L — ABNORMAL LOW (ref 96–112)
GFR calc Af Amer: >90 mL/min (ref >90)
GFR calc non Af Amer: >90 mL/min (ref >90)
Glucose, Bld: 168 mg/dL — ABNORMAL HIGH (ref 70–99)
Potassium: 3.5 mEq/L (ref 3.5–5.1)
Sodium: 133 mEq/L — ABNORMAL LOW (ref 135–145)

## 2012-01-07 LAB — VANCOMYCIN, TROUGH: Vancomycin Tr: 14.6 ug/mL (ref 10.0–20.0)

## 2012-01-07 LAB — CULTURE, ROUTINE-ABSCESS

## 2012-01-07 NOTE — Progress Notes (Addendum)
[facility] [place]  Post-op EVAR Date of Surgery: 09/06/2011 Surgeon: Macon Large Device: [id number] cm main body Gore Excluder device delivered via a right femoral system  18 x 13.5 cm left iliac limb contralateral  16 x 9.5 ipsilateral iliac extension right  Endoleak: No:   History of Present Illness  Alfred [name] is a 61 y.o. male who is s/p EVAR in April of this year and has had low grade fever and LBP. He had + BC and is now on [place] per ID. Awaiting definitive cultures.  Significant Diagnostic Studies: CBC Lab Results  Component Value Date   WBC 6.6 01/07/2012   HGB 11.9* 01/07/2012   HCT 35.7* 01/07/2012   MCV 91.8 01/07/2012   PLT 488* 01/07/2012     BMET    Component Value Date/Time   NA 133* 01/07/2012 0530   K 3.5 01/07/2012 0530   CL 94* 01/07/2012 0530   CO2 29 01/07/2012 0530   GLUCOSE 168* 01/07/2012 0530   GLUCOSE 96 05/26/2006 1118   BUN 7 01/07/2012 0530   CREATININE 0.77 01/07/2012 0530   CALCIUM 8.9 01/07/2012 0530   GFRNONAA >90 01/07/2012 0530   GFRAA >90 01/07/2012 0530    COAG Lab Results  Component Value Date   INR 1.24 01/04/2012   INR 1.50* 09/06/2011   INR 1.00 08/31/2011   No results found for this basename: PTT     I/O last 3 completed shifts: In: 1980 [P.O.:480; IV Piggyback:1500] Out: 2695 [Urine:2695] No data found.   Physical Examination  BP Readings from Last 3 Encounters:  01/07/12 100/64  01/03/12 99/67  12/29/11 122/83   Temp Readings from Last 3 Encounters:  01/07/12 98.8 F (37.1 C) Oral  01/03/12 98.1 F (36.7 C) Oral  12/29/11 98.1 F (36.7 C) Oral   SpO2 Readings from Last 3 Encounters:  01/07/12 95%  01/03/12 98%  12/29/11 98%   Pulse Readings from Last 3 Encounters:  01/07/12 74  01/03/12 93  12/29/11 83    General: A&O x 3, WDWN male in NAD Gait: Normal Pulmonary: normal non-labored breathing  Cardiac: RRR Abdomen: soft, NT, NABS Extremities without ischemic changes, no  Gangrene , no cellulitis; no open wounds;   Neurologic: A&O X 3; Appropriate Affect  Assessment:Plan: Alfred [name] is a 61 y.o. male who is 4 months EVAR.  Pt is still having LBP and low grade fever - 99. WBC normal BC x 1 grew Coccobacilli - ID of organism pending Appreciate ID help. Cont Multiple IV ABx  SignedMarlowe Shores [phone] 01/07/2012 1:02 PM.   I agree with the above.  The patient's PICC line has been removed. The catheter tip culture is pending. I have again discussed with the patient that we will continue with nonoperative treatment to see if he can resolve this problem. He understands that he'll just be continued IV antibiotics over the weekend with plans for a CT scan on Sunday to help future planning.  Durene Cal

## 2012-01-07 NOTE — Progress Notes (Signed)
ANTIBIOTIC CONSULT NOTE - FOLLOW UP  Pharmacy Consult for Vancomycin and Ceftazidime Indication: Suspected graft infection; abscess  No Known Allergies  Patient Measurements: Height: 6' (182.9 cm) Weight: 208 lb 12.8 oz (94.711 kg) IBW/kg (Calculated) : 77.6  Adjusted Body Weight:   Vital Signs: Temp: 98.8 F (37.1 C) (08/09 0506) Temp src: Oral (08/09 0506) BP: 100/64 mmHg (08/09 0506) Pulse Rate: 74  (08/09 0506) Intake/Output from previous day: 08/08 0701 - 08/09 0700 In: 1980 [P.O.:480; IV Piggyback:1500] Out: 450 [Urine:450] Intake/Output from this shift: Total I/O In: 620 [P.O.:240; I.V.:180; IV Piggyback:200] Out: 650 [Urine:650]  Labs:  Basename 01/07/12 0530 01/06/12 0650 01/05/12 0519 01/04/12 1212  WBC 6.6 -- 6.2 6.7  HGB 11.9* -- 10.9* 11.7*  PLT 488* -- 483* 534*  LABCREA -- -- -- --  CREATININE 0.77 0.69 0.76 --   Estimated Creatinine Clearance: 117.2 ml/min (by C-G formula based on Cr of 0.77).  Basename 01/07/12 0550 01/04/12 2009  VANCOTROUGH 14.6 10.0  VANCOPEAK -- --  Drue Dun -- --  GENTTROUGH -- --  GENTPEAK -- --  GENTRANDOM -- --  TOBRATROUGH -- --  TOBRAPEAK -- --  TOBRARND -- --  AMIKACINPEAK -- --  AMIKACINTROU -- --  AMIKACIN -- --     Microbiology: Recent Results (from the past 720 hour(s))  URINE CULTURE     Status: Normal   Collection Time   12/21/11  1:16 PM      Component Value Range Status Comment   Colony Count NO GROWTH   Final    Organism ID, Bacteria NO GROWTH   Final   CULTURE, BLOOD (ROUTINE X 2)     Status: Normal   Collection Time   12/25/11 11:05 AM      Component Value Range Status Comment   Specimen Description BLOOD RIGHT ARM   Final    Special Requests BOTTLES DRAWN AEROBIC ONLY 8CC   Final    Culture  Setup Time 12/25/2011 19:29   Final    Culture NO GROWTH 5 DAYS   Final    Report Status 12/31/2011 FINAL   Final   CULTURE, BLOOD (ROUTINE X 2)     Status: Normal   Collection Time   12/25/11 11:15  AM      Component Value Range Status Comment   Specimen Description BLOOD RIGHT HAND   Final    Special Requests BOTTLES DRAWN AEROBIC ONLY [facility]   Final    Culture  Setup Time 12/25/2011 19:29   Final    Culture NO GROWTH 5 DAYS   Final    Report Status 12/31/2011 FINAL   Final   CULTURE, BLOOD (ROUTINE X 2)     Status: Normal   Collection Time   12/26/11  2:30 PM      Component Value Range Status Comment   Specimen Description BLOOD RIGHT ARM   Final    Special Requests BOTTLES DRAWN AEROBIC ONLY [facility]   Final    Culture  Setup Time 12/26/2011 18:09   Final    Culture NO GROWTH 5 DAYS   Final    Report Status 01/01/2012 FINAL   Final   CULTURE, BLOOD (ROUTINE X 2)     Status: Normal   Collection Time   12/26/11  2:40 PM      Component Value Range Status Comment   Specimen Description BLOOD RIGHT HAND   Final    Special Requests BOTTLES DRAWN AEROBIC ONLY 4CC   Final  Culture  Setup Time 12/26/2011 18:09   Final    Culture NO GROWTH 5 DAYS   Final    Report Status 01/01/2012 FINAL   Final   CULTURE, BLOOD (SINGLE)     Status: Normal (Preliminary result)   Collection Time   01/04/12 12:12 PM      Component Value Range Status Comment   Specimen Description BLOOD ARM LEFT   Final    Special Requests BOTTLES DRAWN AEROBIC AND ANAEROBIC 10CC EACH   Final    Culture  Setup Time 01/04/2012 20:16   Final    Culture     Final    Value: GRAM NEGATIVE COCCOBACILLI     Note: Gram Stain Report Called to,Read Back By and Verified With: NEGO CROSSON @1103  01/05/12   Report Status PENDING   Incomplete   CULTURE, ROUTINE-ABSCESS     Status: Normal   Collection Time   01/04/12  2:57 PM      Component Value Range Status Comment   Specimen Description ABSCESS ASPIRATE LEFT   Final    Special Requests ASPIRATE OF LEFT RETROPERITONEAL ABSCESS   Final    Gram Stain     Final    Value: ABUNDANT WBC PRESENT,BOTH PMN AND MONONUCLEAR     NO SQUAMOUS EPITHELIAL CELLS SEEN     NO ORGANISMS SEEN   Culture NO  GROWTH 3 DAYS   Final    Report Status 01/07/2012 FINAL   Final   ANAEROBIC CULTURE     Status: Normal (Preliminary result)   Collection Time   01/04/12  2:57 PM      Component Value Range Status Comment   Specimen Description ASCITIC ASPIRATE LEFT   Final    Special Requests ASPIRATE OF LEFT RETROPERITONEAL ABSCESS   Final    Gram Stain PENDING   Incomplete    Culture     Final    Value: NO ANAEROBES ISOLATED; CULTURE IN PROGRESS FOR 5 DAYS   Report Status PENDING   Incomplete   CULTURE, BLOOD (ROUTINE X 2)     Status: Normal (Preliminary result)   Collection Time   01/05/12  3:39 PM      Component Value Range Status Comment   Specimen Description BLOOD ARM LEFT   Final    Special Requests BOTTLES DRAWN AEROBIC AND ANAEROBIC 10CC   Final    Culture  Setup Time 01/05/2012 22:13   Final    Culture     Final    Value:        BLOOD CULTURE RECEIVED NO GROWTH TO DATE CULTURE WILL BE HELD FOR 5 DAYS BEFORE ISSUING A FINAL NEGATIVE REPORT   Report Status PENDING   Incomplete   CULTURE, BLOOD (ROUTINE X 2)     Status: Normal (Preliminary result)   Collection Time   01/05/12  3:45 PM      Component Value Range Status Comment   Specimen Description BLOOD HAND LEFT   Final    Special Requests BOTTLES DRAWN AEROBIC ONLY 1CC   Final    Culture  Setup Time 01/05/2012 22:13   Final    Culture     Final    Value:        BLOOD CULTURE RECEIVED NO GROWTH TO DATE CULTURE WILL BE HELD FOR 5 DAYS BEFORE ISSUING A FINAL NEGATIVE REPORT   Report Status PENDING   Incomplete   CATH TIP CULTURE     Status: Normal (Preliminary result)   Collection Time  01/06/12  7:00 PM      Component Value Range Status Comment   Specimen Description CATH TIP PICC LINE ARM RIGHT   Final    Special Requests NONE   Final    Culture NO GROWTH   Final    Report Status PENDING   Incomplete     Anti-infectives     Start     Dose/Rate Route Frequency Ordered Stop   01/05/12 1300   cefTAZidime (FORTAZ) 2 g in dextrose 5 % 50 mL  IVPB        2 g 100 mL/hr over 30 Minutes Intravenous 3 times per day 01/05/12 1155     01/04/12 2200   vancomycin (VANCOCIN) IVPB 1000 mg/200 mL premix        1,000 mg 200 mL/hr over 60 Minutes Intravenous Every 8 hours 01/04/12 2141     01/04/12 2000   vancomycin (VANCOCIN) 1,500 mg in sodium chloride 0.9 % 500 mL IVPB  Status:  Discontinued        1,500 mg 250 mL/hr over 120 Minutes Intravenous Every 12 hours 01/04/12 1457 01/04/12 2141   01/04/12 1600   metroNIDAZOLE (FLAGYL) tablet 500 mg        500 mg Oral 3 times daily 01/04/12 1203     01/04/12 1300   ciprofloxacin (CIPRO) tablet 500 mg  Status:  Discontinued        500 mg Oral 2 times daily 01/04/12 1203 01/05/12 1135   01/04/12 1300   rifampin (RIFADIN) capsule 300 mg        300 mg Oral Every 12 hours 01/04/12 1203     01/04/12 1215   vancomycin (VANCOCIN) 1,500 mg in sodium chloride 0.9 % 500 mL IVPB  Status:  Discontinued        1,500 mg 250 mL/hr over 120 Minutes Intravenous Every 12 hours 01/04/12 1203 01/04/12 1248          Assessment: 60yom on Vancomycin (Day 3 this admission, Day 12 total) and Ceftazidime (Day 3) for suspected graft infection, abscess and gram negative coccobacilli in blood culture. Patient has remained afebrile x 24hrs and WBC wnl. Coccobacilli organisms have not yet been identified and the remaining cultures (repeat blood x2, aspirate, cath tip) have reported NGTD. Patient's renal function has remained stable during admission (CrCl > 100 ml/min). Vancomycin regimen was adjusted on 8/6 to 1g q8h. Vancomycin trough drawn this AM was just below goal at 14.6 - level has increased from 10 and expect some accumulation with q8h regimen so will continue with current dose.   Goal of Therapy:  Vancomycin trough level 15-20 mcg/ml  Plan:  1. Continue Vancomycin 1g IV q8h 2. Continue Ceftazidime 2g IV q8h 3. Continue Rifampin 300mg  q12h, Flagyl 500mg  tid - appropriate for renal function 4. Monitor renal  function, cultures, clinical course and order follow-up levels as appropriate 5. Follow-up ID recommendations and narrowing of antibiotic regimen  Ramone, Gander [phone] 01/07/2012,10:24 AM

## 2012-01-07 NOTE — Progress Notes (Signed)
[facility]    Date of Admission:  01/04/2012    Total days of antibiotics :11  Day 12: Vancomycin  Day 12: Rifampin  Day 12: Metronidazole  Day 3: Caftazidime  . aspirin  81 mg Oral QHS  . atorvastatin  10 mg Oral QHS  . cefTAZidime (FORTAZ)  IV  2 g Intravenous Q8H  . clopidogrel  75 mg Oral q morning - 10a  . hydrochlorothiazide  25 mg Oral Daily  . losartan  100 mg Oral Daily  . metoprolol succinate  100 mg Oral QHS  . metroNIDAZOLE  500 mg Oral TID  . multivitamin with minerals  1 tablet Oral q morning - 10a  . pantoprazole  40 mg Oral Q1200  . potassium chloride  20 mEq Oral TID  . rifampin  300 mg Oral Q12H  . vancomycin  1,000 mg Intravenous Q8H    Subjective: Pt is feeling better. Afebrile last night, Tmax 98.8. Some back pain but no new complain. Pt denies n/v, chest pain, cough, rashes, SOB, abdominal pain,diarrhea or dysuria  Objective: Temp:  [97.9 F (36.6 C)-98.8 F (37.1 C)] 98.5 F (36.9 C) (08/09 1332) Pulse Rate:  [74-84] 74  (08/09 0506) Resp:  [16-18] 18  (08/09 1332) BP: (100-126)/(64-79) 100/64 mmHg (08/09 1332) SpO2:  [95 %-98 %] 98 % (08/09 1332) General: resting in bed  HEENT: PERRL, EOMI, no scleral icterus  Cardiac: RRR, no rubs, murmurs or gallops  Pulm: clear to auscultation bilaterally, moving normal volumes of air  Abd: soft, nontender, nondistended, BS present  Ext: warm and well perfused, no pedal edema, Rt arm PICC line removed. Neuro: alert and oriented X3, cranial nerves II-XII grossly intact. Some numbness in left groin  Lab Results Lab Results  Component Value Date   WBC 6.6 01/07/2012   HGB 11.9* 01/07/2012   HCT 35.7* 01/07/2012   MCV 91.8 01/07/2012   PLT 488* 01/07/2012    Lab Results  Component Value Date   CREATININE 0.77 01/07/2012   BUN 7 01/07/2012   NA 133* 01/07/2012   K 3.5 01/07/2012   CL 94* 01/07/2012   CO2 29 01/07/2012    Lab Results  Component Value Date   ALT 13 01/04/2012   AST 17 01/04/2012    ALKPHOS 72 01/04/2012   BILITOT 0.5 01/04/2012      Microbiology: Recent Results (from the past 240 hour(s))  CULTURE, BLOOD (SINGLE)     Status: Normal (Preliminary result)   Collection Time   01/04/12 12:12 PM      Component Value Range Status Comment   Specimen Description BLOOD ARM LEFT   Final    Special Requests BOTTLES DRAWN AEROBIC AND ANAEROBIC 10CC EACH   Final    Culture  Setup Time 01/04/2012 20:16   Final    Culture     Final    Value: GRAM NEGATIVE COCCOBACILLI     Note: Gram Stain Report Called to,Read Back By and Verified With: NEGO CROSSON @1103  01/05/12   Report Status PENDING   Incomplete   CULTURE, ROUTINE-ABSCESS     Status: Normal   Collection Time   01/04/12  2:57 PM      Component Value Range Status Comment   Specimen Description ABSCESS ASPIRATE LEFT   Final    Special Requests ASPIRATE OF LEFT RETROPERITONEAL ABSCESS   Final    Gram Stain     Final    Value: ABUNDANT WBC PRESENT,BOTH PMN AND  MONONUCLEAR     NO SQUAMOUS EPITHELIAL CELLS SEEN     NO ORGANISMS SEEN   Culture NO GROWTH 3 DAYS   Final    Report Status 01/07/2012 FINAL   Final   ANAEROBIC CULTURE     Status: Normal (Preliminary result)   Collection Time   01/04/12  2:57 PM      Component Value Range Status Comment   Specimen Description ASCITIC ASPIRATE LEFT   Final    Special Requests ASPIRATE OF LEFT RETROPERITONEAL ABSCESS   Final    Gram Stain PENDING   Incomplete    Culture     Final    Value: NO ANAEROBES ISOLATED; CULTURE IN PROGRESS FOR 5 DAYS   Report Status PENDING   Incomplete   CULTURE, BLOOD (ROUTINE X 2)     Status: Normal (Preliminary result)   Collection Time   01/05/12  3:39 PM      Component Value Range Status Comment   Specimen Description BLOOD ARM LEFT   Final    Special Requests BOTTLES DRAWN AEROBIC AND ANAEROBIC 10CC   Final    Culture  Setup Time 01/05/2012 22:13   Final    Culture     Final    Value:        BLOOD CULTURE RECEIVED NO GROWTH TO DATE CULTURE WILL BE HELD  FOR 5 DAYS BEFORE ISSUING A FINAL NEGATIVE REPORT   Report Status PENDING   Incomplete   CULTURE, BLOOD (ROUTINE X 2)     Status: Normal (Preliminary result)   Collection Time   01/05/12  3:45 PM      Component Value Range Status Comment   Specimen Description BLOOD HAND LEFT   Final    Special Requests BOTTLES DRAWN AEROBIC ONLY 1CC   Final    Culture  Setup Time 01/05/2012 22:13   Final    Culture     Final    Value:        BLOOD CULTURE RECEIVED NO GROWTH TO DATE CULTURE WILL BE HELD FOR 5 DAYS BEFORE ISSUING A FINAL NEGATIVE REPORT   Report Status PENDING   Incomplete   CATH TIP CULTURE     Status: Normal (Preliminary result)   Collection Time   01/06/12  7:00 PM      Component Value Range Status Comment   Specimen Description CATH TIP PICC LINE ARM RIGHT   Final    Special Requests NONE   Final    Culture NO GROWTH   Final    Report Status PENDING   Incomplete     Assessment: Alfred [name] is a 61 y.o. male s/p AAA repair through bilateral groin cutdown on 09/06/2011 p/w worsening left flank pain, radiating to groin.CT on 8/5 positive for progression of periaortic infection with abscess. CT guided aspiration of 4 ml of turbid fluid collected on 01/04/12 and sent for culture. Pt is feeling better today. 1 Blood culture positive for GRAM NEGATIVE COCCOBACILLI. D/C'd Ciprofloxacin and started ceftazidime for better gram negative coverage. Possible of source of bacteriemia, PICC line or Aortic graft infection or contaminated. Pending abscess culture. PICC line removed and sent for tip culture.  Plan: 1. Continue vancomycin IV 1,000 mg, IV, Q8H  2. Continue rifampin 300 mg, PO, Q12H  3. Continue MetroNIDAZOLE 500 mg, PO, TID  4. Continue FORTAZ IV 2 g, IV, Q8H   Akter, Nasrin 01/07/2012, 6:35 PM     INFECTIOUS DISEASE ATTENDING ADDENDUM:     [facility]   Date: 01/10/2012  Patient name: Alfred [name]  Medical record number: [id number]  Date of birth:  12/19/1950    This patient has been seen and discussed with the house staff. Please see their note for complete details. I concur with their findings with the following additions/corrections:  Please see my separate note on the 9th.  Acey Lav 01/10/2012, 11:43 AM

## 2012-01-08 NOTE — Progress Notes (Addendum)
[facility] Progress Note  01/08/2012 11:07 AM HD 4  Subjective:  Pain is well controlled.  Oral pain medication and occasional morphine controlling pain.  Afebrile x 24 hrs  VSS Filed Vitals:   01/08/12 0441  BP: 113/73  Pulse: 71  Temp: 98.7 F (37.1 C)  Resp: 18    Physical Exam: Cardiac:  reg Lungs:  Non labored Abdomen:  Soft NT ND Extremities:  + palpable DP bilaterally  CBC    Component Value Date/Time   WBC 6.6 01/07/2012 0530   RBC 3.89* 01/07/2012 0530   HGB 11.9* 01/07/2012 0530   HCT 35.7* 01/07/2012 0530   PLT 488* 01/07/2012 0530   MCV 91.8 01/07/2012 0530   MCH 30.6 01/07/2012 0530   MCHC 33.3 01/07/2012 0530   RDW 12.8 01/07/2012 0530   LYMPHSABS 1.2 12/24/2011 1722   MONOABS 0.6 12/24/2011 1722   EOSABS 0.0 12/24/2011 1722   BASOSABS 0.0 12/24/2011 1722    BMET    Component Value Date/Time   NA 133* 01/07/2012 0530   K 3.5 01/07/2012 0530   CL 94* 01/07/2012 0530   CO2 29 01/07/2012 0530   GLUCOSE 168* 01/07/2012 0530   GLUCOSE 96 05/26/2006 1118   BUN 7 01/07/2012 0530   CREATININE 0.77 01/07/2012 0530   CALCIUM 8.9 01/07/2012 0530   GFRNONAA >90 01/07/2012 0530   GFRAA >90 01/07/2012 0530    INR    Component Value Date/Time   INR 1.24 01/04/2012 1212     Intake/Output Summary (Last 24 hours) at 01/08/12 1107 Last data filed at 01/08/12 1027  Gross per 24 hour  Intake   1320 ml  Output    650 ml  Net    670 ml   Cultures:  1.  Blood Cx 01/04/12:  GRAM NEGATIVE COCCOBACILLI-awaiting final result 2.  PICC line tip 01/06/12:  NGTD 3.  Blood Cx x 2 from 01/05/12:  NGTD 4.  Ascitic aspirate Cx 01/04/12:  No anaerobes isolated. ABUNDANT WBC PRESENT,BOTH PMN AND MONONUCLEAR NO SQUAMOUS EPITHELIAL CELLS SEEN NO ORGANISMS SEEN Culture  NO GROWTH 3 DAYS    Assessment/Plan:  61 y.o. male is  Admitted with possible worsening stent graft infection   HD 4  -pt doing well with IV ABx-he has been afebrile x 24 hrs and WBC was normal yesterday. -appreciate ID  assistance -Pt scheduled for CTA of abdomen and pelvis tomorrow to help guide treatment. -pt's pain is well controlled.   Doreatha Massed, PA-C [facility] [phone] 01/08/2012 11:07 AM  Agree with above Afeb now. Continues IV Fortaz and Vanco.  Cath tip clt negative  so far.  Bld clts 01/05/12: negative so far.  CT guided aspirate from 01/04/12: gram stain- no organisms, clt negative so far.  1 blood culture from 01/04/12 had gram negative coccobacilli (possible contaminant?) F/U CT scan Sunday.  Di Kindle. Edilia Bo, MD, FACS Beeper [phone] 01/08/2012

## 2012-01-08 NOTE — Progress Notes (Signed)
Pt up walking independently in hallway at this time; will cont. To monitor.

## 2012-01-09 ENCOUNTER — Inpatient Hospital Stay (HOSPITAL_COMMUNITY): Payer: Managed Care, Other (non HMO)

## 2012-01-09 ENCOUNTER — Other Ambulatory Visit (HOSPITAL_COMMUNITY): Payer: Managed Care, Other (non HMO)

## 2012-01-09 DIAGNOSIS — F329 Major depressive disorder, single episode, unspecified: Secondary | ICD-10-CM | POA: Insufficient documentation

## 2012-01-09 DIAGNOSIS — I2510 Atherosclerotic heart disease of native coronary artery without angina pectoris: Secondary | ICD-10-CM | POA: Insufficient documentation

## 2012-01-09 DIAGNOSIS — Z86010 Personal history of colonic polyps: Secondary | ICD-10-CM | POA: Insufficient documentation

## 2012-01-09 DIAGNOSIS — N400 Enlarged prostate without lower urinary tract symptoms: Secondary | ICD-10-CM | POA: Insufficient documentation

## 2012-01-09 DIAGNOSIS — E785 Hyperlipidemia, unspecified: Secondary | ICD-10-CM | POA: Insufficient documentation

## 2012-01-09 DIAGNOSIS — I10 Essential (primary) hypertension: Secondary | ICD-10-CM | POA: Insufficient documentation

## 2012-01-09 DIAGNOSIS — I714 Abdominal aortic aneurysm, without rupture, unspecified: Secondary | ICD-10-CM | POA: Insufficient documentation

## 2012-01-09 DIAGNOSIS — F32A Depression, unspecified: Secondary | ICD-10-CM | POA: Insufficient documentation

## 2012-01-09 LAB — ANAEROBIC CULTURE

## 2012-01-09 LAB — CATH TIP CULTURE: Culture: NO GROWTH

## 2012-01-09 MED ORDER — IOHEXOL 350 MG/ML SOLN
100.0000 mL | Freq: Once | INTRAVENOUS | Status: AC | PRN
  Administered 2012-01-09: 100 mL via INTRAVENOUS

## 2012-01-09 NOTE — Plan of Care (Signed)
Problem: Phase III Progression Outcomes Goal: IV/normal saline lock discontinued Outcome: Progressing Patient on multiple ABx. Needs IV fluids KVO.  Problem: Discharge Progression Outcomes Goal: Pain controlled with appropriate interventions Outcome: Progressing Pain well controled with meds. Pain at patients tolerable level. Will continue to monitor and intervene if necessary.    Goal: Hemodynamically stable Outcome: Completed/Met Date Met:  01/09/12 Vital signs stable. Will continue to monitor. Goal: Tolerating diet Outcome: Progressing Tolerating current diet. Denies nausea or vomiting.

## 2012-01-09 NOTE — Progress Notes (Signed)
IV R FA removed at this time; pt c/o tenderness at site; reddness at site noted as well; ice pack applied to site at this time; will cont. To monitor.

## 2012-01-09 NOTE — Progress Notes (Addendum)
VASCULAR PROGRESS NOTE  SUBJECTIVE: Mild steady left flank pain. No hematuria.  PHYSICAL EXAM: Filed Vitals:   01/08/12 1412 01/08/12 2007 01/08/12 2142 01/09/12 0445  BP: 109/70 109/72 110/68 110/70  Pulse: 91 89 84 76  Temp: 98.4 F (36.9 C) 99.6 F (37.6 C)  99 F (37.2 C)  TempSrc: Oral Oral  Oral  Resp: 18 19  19   Height:      Weight:      SpO2: 97% 94%  94%   Abdomen soft and non-tender Groin incisions look fine Lungs clear.  LABS: Cath tip clt negative so far.  Bld clts 01/05/12: negative so far.  CT guided aspirate from 01/04/12: gram stain- no organisms, clt negative so far.  1 blood culture from 01/04/12 had gram negative coccobacilli (possible contaminant?)     ASSESSMENT/PLAN: 1. T max = 99.6. On IV Fortaz and Vanco. Also on po Flagyl and Rifampin. For F/U CT scan today. 2. Overall feels better.   Waverly Ferrari, MD, FACS Beeper: [phone] 01/09/2012

## 2012-01-10 LAB — VANCOMYCIN, TROUGH: Vancomycin Tr: 15.6 ug/mL (ref 10.0–20.0)

## 2012-01-10 LAB — CULTURE, BLOOD (SINGLE)

## 2012-01-10 MED ORDER — HEPARIN SOD (PORK) LOCK FLUSH 100 UNIT/ML IV SOLN
250.0000 [IU] | INTRAVENOUS | Status: AC | PRN
  Administered 2012-01-10: 250 [IU]

## 2012-01-10 MED ORDER — HYDROMORPHONE HCL 2 MG PO TABS
2.0000 mg | ORAL_TABLET | Freq: Four times a day (QID) | ORAL | Status: DC | PRN
  Administered 2012-01-10: 2 mg via ORAL
  Filled 2012-01-10: qty 1

## 2012-01-10 MED ORDER — HYDROMORPHONE HCL 2 MG PO TABS: 2.0000 mg | ORAL_TABLET | Freq: Four times a day (QID) | ORAL | Status: DC | PRN

## 2012-01-10 MED ORDER — SODIUM CHLORIDE 0.9 % IJ SOLN
10.0000 mL | INTRAMUSCULAR | Status: DC | PRN
  Administered 2012-01-10: 10 mL

## 2012-01-10 MED ORDER — SODIUM CHLORIDE 0.9 % IV SOLN
2.0000 g | Freq: Three times a day (TID) | INTRAVENOUS | Status: DC
  Administered 2012-01-10 (×2): 2 g via INTRAVENOUS
  Filled 2012-01-10 (×4): qty 2

## 2012-01-10 MED ORDER — POTASSIUM CHLORIDE CRYS ER 20 MEQ PO TBCR: 20.0000 meq | EXTENDED_RELEASE_TABLET | Freq: Two times a day (BID) | ORAL | Status: DC

## 2012-01-10 MED ORDER — FENTANYL 50 MCG/HR TD PT72: 1.0000 | MEDICATED_PATCH | TRANSDERMAL | Status: DC

## 2012-01-10 MED ORDER — ACETAMINOPHEN 500 MG PO TABS: 1000.0000 mg | ORAL_TABLET | Freq: Four times a day (QID) | ORAL | Status: DC | PRN

## 2012-01-10 MED ORDER — RIFAMPIN 300 MG PO CAPS: 300.0000 mg | ORAL_CAPSULE | Freq: Two times a day (BID) | ORAL | Status: DC

## 2012-01-10 MED ORDER — RIFAMPIN 300 MG PO CAPS
300.0000 mg | ORAL_CAPSULE | Freq: Two times a day (BID) | ORAL | Status: DC
  Administered 2012-01-10: 300 mg via ORAL
  Filled 2012-01-10 (×2): qty 1

## 2012-01-10 MED ORDER — VANCOMYCIN HCL IN DEXTROSE 1-5 GM/200ML-% IV SOLN: 1000.0000 mg | Freq: Three times a day (TID) | INTRAVENOUS | Status: DC

## 2012-01-10 MED ORDER — SODIUM CHLORIDE 0.9 % IV SOLN: 2.0000 g | Freq: Three times a day (TID) | INTRAVENOUS | Status: DC

## 2012-01-10 MED ORDER — FENTANYL 50 MCG/HR TD PT72
50.0000 ug | MEDICATED_PATCH | TRANSDERMAL | Status: DC
  Administered 2012-01-10: 50 ug via TRANSDERMAL
  Filled 2012-01-10: qty 1

## 2012-01-10 NOTE — Discharge Summary (Signed)
[facility] Discharge Summary   Patient ID:  JOVEN MOM MRN: [id number] DOB/AGE: Mar 11, 1951 61 y.o.  Admit date: 01/04/2012 Discharge date: 01/10/2012  Admission Diagnosis: Aortic Graft infection left low back, flank, groin pain, hx: AAA stent graph repair-April 2013  Discharge Diagnoses:Aortic Graft infection left low back, flank, groin pain, hx: AAA stent graph repair-April 2013  Secondary Diagnoses: Past Medical History  Diagnosis Date  . AAA (abdominal aortic aneurysm)   . Dyslipidemia   . Left lumbar radiculopathy   . Hyperplasia of prostate without lower urinary tract symptoms (LUTS)   . Other and unspecified hyperlipidemia   . Hx of adenomatous colonic polyps 2008  . Depression   . HTN (hypertension)     unspecified essential  . CAD (coronary artery disease)     DES to RCA and CFX; Dr. Excell Seltzer cardiologist    Discharged Condition: good  HPI:  Patient is a 61 y.o. year old male who presents for evaluation of worsening left flank and groin pain with know periaortic fluid collection and possible stent graft infection. He was recently admitted and placed on IV antibiotics. He had a repeat CT yesterday which showed increase in the fluid collection. He is requiring frequent narcotic for pain control. Intermittent fever 99-100. Other medical problems include hypertension, CAD. He was admitted for IV antibiotics and pain control.   Hospital Course:  ONEAL SCHOENBERGER is a 61 y.o. male is S/P EVAR 08/2011. He had Ct Abdomen Pelvis, and showed periaortic infection and 3 x 5.2 cm fluid collection at the left posterolateral aspect of the aortic endo graft. 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 Repeat CT on 8/5 and shows progression of periaortic infection with increase in size 6.1 x 5.4 cm abscess. CT guided aspiration of 4 ml of turbid fluid collected yesterday and sent  for culture. All Cultures from Fluid, PICC line and Blood have been negative. ID - Dr. Synthia Innocent has been following and recommendation for IV regimen and long term antibiotics have been made New PICC line was placed and the Pt will be DC on IV Vancomycin, meropenem and PO rifampin HH RN for IV infusion and lab draws have been set up. Pain control reviewed with pharmacy and pt will be sent home on Fentanyl patch and Dilaudid for breakthrough pain Hypokalemia being treated with PO Potassium Pt. Ambulating, voiding and taking PO diet without difficulty. Low grade nocturnal fever controlled with PO tylenol as needed Labs as below   Consults: Dr. Melba Coon Treatment Team:  Nada Libman, MD  Significant Diagnostic Studies: CBC Lab Results  Component Value Date   WBC 6.6 01/07/2012   HGB 11.9* 01/07/2012   HCT 35.7* 01/07/2012   MCV 91.8 01/07/2012   PLT 488* 01/07/2012    BMET    Component Value Date/Time   NA 133* 01/07/2012 0530   K 3.5 01/07/2012 0530   CL 94* 01/07/2012 0530   CO2 29 01/07/2012 0530   GLUCOSE 168* 01/07/2012 0530   GLUCOSE 96 05/26/2006 1118   BUN 7 01/07/2012 0530   CREATININE 0.77 01/07/2012 0530   CALCIUM 8.9 01/07/2012 0530   GFRNONAA >90 01/07/2012 0530   GFRAA >90 01/07/2012 0530   COAG Lab Results  Component Value Date   INR 1.24 01/04/2012   INR 1.50* 09/06/2011   INR 1.00 08/31/2011     Disposition:  Discharge to :Home Discharge Orders  Future Appointments: Provider: Department: Dept Phone: Center:   01/20/2012 11:30 AM Sherren Kerns, MD [facility] [phone] [facility]   01/21/2012 8:30 AM [facility] [phone] None   01/21/2012 9:15 AM Tonny Bollman, MD [facility] [phone] [facility]   02/10/2012 9:30 AM [facility] [phone] [facility]   04/13/2012 12:00 PM [facility] [phone] [facility]   04/13/2012 1:00 PM Sherren Kerns, MD [facility] [phone] [facility]     Future Orders  Please Complete By Expires   Resume previous diet      Driving Restrictions      Comments:   No driving   Lifting restrictions      Comments:   No lifting for 6 weeks   Call MD for:  temperature >100.5      Call MD for:  redness, tenderness, or signs of infection (pain, swelling, bleeding, redness, odor or green/yellow discharge around incision site)      Call MD for:  severe or increased pain, loss or decreased feeling  in affected limb(s)      ABDOMINAL PROCEDURE/ANEURYSM REPAIR/AORTO-BIFEMORAL BYPASS:  Call MD for increased abdominal pain; cramping diarrhea; nausea/vomiting         Tyrian, Peart  Home Medication Instructions JOA:[id number]   Printed on:01/10/12 1243  Medication Information                    aspirin 81 MG tablet Take 81 mg by mouth at bedtime.            clopidogrel (PLAVIX) 75 MG tablet Take 75 mg by mouth every morning.            metoprolol succinate (TOPROL-XL) 100 MG 24 hr tablet Take 100 mg by mouth at bedtime. Take with or immediately following a meal.           losartan-hydrochlorothiazide (HYZAAR) 100-25 MG per tablet Take 1 tablet by mouth at bedtime.            atorvastatin (LIPITOR) 10 MG tablet Take 10 mg by mouth at bedtime.            Multiple Vitamin (MULTIVITAMIN WITH MINERALS) TABS Take 1 tablet by mouth every morning.           sodium chloride 0.9 % SOLN 500 mL with vancomycin 1000 MG SOLR 1,500 mg Inject 1,500 mg into the vein every 12 (twelve) hours.           Heparin Sodium, Porcine, (SASH KIT IJ) Inject as directed.           HYDROmorphone (DILAUDID) 2 MG tablet Take 2 mg by mouth every 6 (six) hours as needed. For pain           acetaminophen (TYLENOL) 500 MG tablet Take 2 tablets (1,000 mg total) by mouth every 6 (six) hours as needed. For pain           fentaNYL (DURAGESIC - DOSED MCG/HR) 50 MCG/HR Place 1 patch (50 mcg total) onto the skin every 3 (three) days.           HYDROmorphone (DILAUDID) 2 MG tablet Take 1-2  tablets (2-4 mg total) by mouth every 6 (six) hours as needed.           sodium chloride 0.9 % SOLN 100 mL with meropenem 1 G SOLR 2 g Inject 2 g into the vein every 8 (eight) hours.  rifampin (RIFADIN) 300 MG capsule Take 1 capsule (300 mg total) by mouth 2 (two) times daily.           vancomycin (VANCOCIN) 1 GM/200ML SOLN Inject 200 mLs (1,000 mg total) into the vein every 8 (eight) hours.           potassium chloride SA (K-DUR,KLOR-CON) 20 MEQ tablet Take 1 tablet (20 mEq total) by mouth 2 (two) times daily.            Verbal and written Discharge instructions given to the patient. Wound care per Discharge AVS Follow-up Information    Follow up with Acey Lav, MD in 1 month. (office will arrange)    Contact information:   [address] [place] [address] [phone]       Follow up with Sherren Kerns, MD. (01/20/12  @  11:30am)    Contact information:   [address] [place] [address] [phone]          Signed: Marlowe Shores 01/10/2012, 12:43 PM

## 2012-01-10 NOTE — Progress Notes (Signed)
[facility] [place]  Post-op EVAR Date of Surgery: 08/2011  History of Present Illness  Alfred [name] is a 61 y.o. male who is s/p EVAR. The patient complains of back painbut states it is improving; denies abdominal pain; denies lower extremity pain.  He is Ambulating and taking PO well without nausea or vomiting.   IMAGING: CT scan 01/09/12 Findings: No significant change in the size of the multiloculated  abscess in the left side of the retroperitoneum, adjacent to the  native abdominal aorta, maximum measurements currently  approximating 8.2 x 5.7 x 8.3 cm (series 6, image 40 and series  606, image 48). Stent graft widely patent. No evidence of  endoleak. Single renal arteries widely patent. Widely patent  celiac and SMA. Retrograde filling of the IMA. No abscess  elsewhere in the abdomen or pelvis. Mild edema/inflammation in the  retroperitoneum of the left upper pelvis, adjacent to the external  iliac vessels, unchanged.   Significant Diagnostic Studies: CBC Lab Results  Component Value Date   WBC 6.6 01/07/2012   HGB 11.9* 01/07/2012   HCT 35.7* 01/07/2012   MCV 91.8 01/07/2012   PLT 488* 01/07/2012     BMET    Component Value Date/Time   NA 133* 01/07/2012 0530   K 3.5 01/07/2012 0530   CL 94* 01/07/2012 0530   CO2 29 01/07/2012 0530   GLUCOSE 168* 01/07/2012 0530   GLUCOSE 96 05/26/2006 1118   BUN 7 01/07/2012 0530   CREATININE 0.77 01/07/2012 0530   CALCIUM 8.9 01/07/2012 0530   GFRNONAA >90 01/07/2012 0530   GFRAA >90 01/07/2012 0530    COAG Lab Results  Component Value Date   INR 1.24 01/04/2012   INR 1.50* 09/06/2011   INR 1.00 08/31/2011   No results found for this basename: PTT     I/O last 3 completed shifts: In: 720 [P.O.:720] Out: 600 [Urine:600] Patient Vitals for the past 24 hrs:  Urine Occurrence  01/10/12 0500 2   01/09/12 1411 1   01/09/12 1211 1     Physical Examination  BP Readings from Last 3 Encounters:  01/10/12 102/67  01/03/12  99/67  12/29/11 122/83   Temp Readings from Last 3 Encounters:  01/10/12 98.5 F (36.9 C) Oral  01/03/12 98.1 F (36.7 C) Oral  12/29/11 98.1 F (36.7 C) Oral   SpO2 Readings from Last 3 Encounters:  01/10/12 94%  01/03/12 98%  12/29/11 98%   Pulse Readings from Last 3 Encounters:  01/10/12 80  01/03/12 93  12/29/11 83    General: A&O x 3, WDWN male in NAD Gait: Normal Pulmonary: normal non-labored breathing  Cardiac: RRR Abdomen: soft, NT, NABS Bilateral groin wounds: clean, dry, intact, without hematoma  Assessment: Alfred [name] is a 61 y.o. male who is S/P EVAR. Feeling overall better. Pt remains afebrile during the day with low grade temp at night/ WBC WNL Pt is on multiple IV ABX for + BC and fluid collection around Aortic graft BC now corrected to no growth but not final result PIC line culture NEG   Plan: Continue antibiotics Possible DC  Today Await ID recommendations re: home ABX and length of treatment Will need new PICC line  SignedMarlowe Shores [phone] 01/10/2012 8:20 AM.

## 2012-01-10 NOTE — Progress Notes (Signed)
INFECTIOUS DISEASE ATTENDING ADDENDUM:     [facility]   Date: 01/10/2012  Patient name: Alfred [name]  Medical record number: [id number]  Date of birth: 1951-04-20    This patient has been seen and discussed with the house staff. Please see their note for complete details. I concur with their findings with the following additions/corrections:  Patients cultures have all been unrevealing and his blood culture turns out to have been an error from lab. He has no growth from it as well.  I will consolidate his antibiotics to   Vancomycin dosed for trough 15-20  AND  MEROPENEM 2 GRAMS IV Q 8 HOURS  AND  RIFAMPIN 300MG  TWICE DAILY (OR CAN BE 600MG  DAILY IF PT PREFERS)  THIS REGIMEN NEEDS TO BE CONTINUED FOR AT LEAST 6 IF NOT 8 WEEKS  HE NEEDS A REPEAT CT SCAN IN ONE MONTH  I WILL ARRANGE FU IN OUR CLINIC IN ANOTHER 2 WEEKS AND AFTER HIS CT SCAN (WHICH WE WILL ARRANGE AS AN OUTPT)  Patient is dcing to home today.     Acey Lav 01/10/2012, 11:43 AM

## 2012-01-10 NOTE — Progress Notes (Addendum)
ANTIBIOTIC CONSULT NOTE - Follow Up  Pharmacy Consult for Vancomycin Indication: Suspected graft infection; abscess  No Known Allergies  Patient Measurements: Height: 6' (182.9 cm) Weight: 208 lb 12.8 oz (94.711 kg) IBW/kg (Calculated) : 77.6   Vital Signs: Temp: 98.5 F (36.9 C) (08/12 0514) Temp src: Oral (08/12 0514) BP: 102/67 mmHg (08/12 0514) Pulse Rate: 80  (08/12 0514) Intake/Output from previous day: 08/11 0701 - 08/12 0700 In: 720 [P.O.:720] Out: 600 [Urine:600] Intake/Output from this shift:    Labs: No results found for this basename: WBC:3,HGB:3,PLT:3,LABCREA:3,CREATININE:3 in the last 72 hours Estimated Creatinine Clearance: 117.2 ml/min (by C-G formula based on Cr of 0.77). No results found for this basename: VANCOTROUGH:2,VANCOPEAK:2,VANCORANDOM:2,GENTTROUGH:2,GENTPEAK:2,GENTRANDOM:2,TOBRATROUGH:2,TOBRAPEAK:2,TOBRARND:2,AMIKACINPEAK:2,AMIKACINTROU:2,AMIKACIN:2, in the last 72 hours   Microbiology: Recent Results (from the past 720 hour(s))  URINE CULTURE     Status: Normal   Collection Time   12/21/11  1:16 PM      Component Value Range Status Comment   Colony Count NO GROWTH   Final    Organism ID, Bacteria NO GROWTH   Final   CULTURE, BLOOD (ROUTINE X 2)     Status: Normal   Collection Time   12/25/11 11:05 AM      Component Value Range Status Comment   Specimen Description BLOOD RIGHT ARM   Final    Special Requests BOTTLES DRAWN AEROBIC ONLY 8CC   Final    Culture  Setup Time 12/25/2011 19:29   Final    Culture NO GROWTH 5 DAYS   Final    Report Status 12/31/2011 FINAL   Final   CULTURE, BLOOD (ROUTINE X 2)     Status: Normal   Collection Time   12/25/11 11:15 AM      Component Value Range Status Comment   Specimen Description BLOOD RIGHT HAND   Final    Special Requests BOTTLES DRAWN AEROBIC ONLY [facility]   Final    Culture  Setup Time 12/25/2011 19:29   Final    Culture NO GROWTH 5 DAYS   Final    Report Status 12/31/2011 FINAL   Final   CULTURE,  BLOOD (ROUTINE X 2)     Status: Normal   Collection Time   12/26/11  2:30 PM      Component Value Range Status Comment   Specimen Description BLOOD RIGHT ARM   Final    Special Requests BOTTLES DRAWN AEROBIC ONLY [facility]   Final    Culture  Setup Time 12/26/2011 18:09   Final    Culture NO GROWTH 5 DAYS   Final    Report Status 01/01/2012 FINAL   Final   CULTURE, BLOOD (ROUTINE X 2)     Status: Normal   Collection Time   12/26/11  2:40 PM      Component Value Range Status Comment   Specimen Description BLOOD RIGHT HAND   Final    Special Requests BOTTLES DRAWN AEROBIC ONLY 4CC   Final    Culture  Setup Time 12/26/2011 18:09   Final    Culture NO GROWTH 5 DAYS   Final    Report Status 01/01/2012 FINAL   Final   CULTURE, BLOOD (SINGLE)     Status: Normal   Collection Time   01/04/12 12:12 PM      Component Value Range Status Comment   Specimen Description BLOOD ARM LEFT   Final    Special Requests BOTTLES DRAWN AEROBIC AND ANAEROBIC 10CC EACH   Final    Culture  Setup  Time 01/04/2012 20:16   Final    Culture     Final    Value: NO GROWTH 5 DAYS     Note: PREVIOUSLY REPORTED AS Gram Negative Coccibacilli CORRECTED RESULTS CALLED TO: SYLVIA HOWELL @0810  01/10/12 BY KRAWS Gram Stain Report Called to,Read Back By and Verified With: NEGO CROSSON @1103  01/05/12   Report Status 01/10/2012 FINAL   Final   CULTURE, ROUTINE-ABSCESS     Status: Normal   Collection Time   01/04/12  2:57 PM      Component Value Range Status Comment   Specimen Description ABSCESS ASPIRATE LEFT   Final    Special Requests ASPIRATE OF LEFT RETROPERITONEAL ABSCESS   Final    Gram Stain     Final    Value: ABUNDANT WBC PRESENT,BOTH PMN AND MONONUCLEAR     NO SQUAMOUS EPITHELIAL CELLS SEEN     NO ORGANISMS SEEN   Culture NO GROWTH 3 DAYS   Final    Report Status 01/07/2012 FINAL   Final   ANAEROBIC CULTURE     Status: Normal   Collection Time   01/04/12  2:57 PM      Component Value Range Status Comment   Specimen  Description ASCITIC ASPIRATE LEFT   Final    Special Requests ASPIRATE OF LEFT RETROPERITONEAL ABSCESS   Final    Gram Stain     Final    Value: ABUNDANT WBC PRESENT,BOTH PMN AND MONONUCLEAR     NO SQUAMOUS EPITHELIAL CELLS SEEN     NO ORGANISMS SEEN   Culture NO ANAEROBES ISOLATED   Final    Report Status 01/09/2012 FINAL   Final   CULTURE, BLOOD (ROUTINE X 2)     Status: Normal (Preliminary result)   Collection Time   01/05/12  3:39 PM      Component Value Range Status Comment   Specimen Description BLOOD ARM LEFT   Final    Special Requests BOTTLES DRAWN AEROBIC AND ANAEROBIC 10CC   Final    Culture  Setup Time 01/05/2012 22:13   Final    Culture     Final    Value:        BLOOD CULTURE RECEIVED NO GROWTH TO DATE CULTURE WILL BE HELD FOR 5 DAYS BEFORE ISSUING A FINAL NEGATIVE REPORT   Report Status PENDING   Incomplete   CULTURE, BLOOD (ROUTINE X 2)     Status: Normal (Preliminary result)   Collection Time   01/05/12  3:45 PM      Component Value Range Status Comment   Specimen Description BLOOD HAND LEFT   Final    Special Requests BOTTLES DRAWN AEROBIC ONLY 1CC   Final    Culture  Setup Time 01/05/2012 22:13   Final    Culture     Final    Value:        BLOOD CULTURE RECEIVED NO GROWTH TO DATE CULTURE WILL BE HELD FOR 5 DAYS BEFORE ISSUING A FINAL NEGATIVE REPORT   Report Status PENDING   Incomplete   CATH TIP CULTURE     Status: Normal   Collection Time   01/06/12  7:00 PM      Component Value Range Status Comment   Specimen Description CATH TIP PICC LINE ARM RIGHT   Final    Special Requests NONE   Final    Culture NO GROWTH 2 DAYS   Final    Report Status 01/09/2012 FINAL   Final  Medical History: Past Medical History  Diagnosis Date  . AAA (abdominal aortic aneurysm)   . Dyslipidemia   . Left lumbar radiculopathy   . Hyperplasia of prostate without lower urinary tract symptoms (LUTS)   . Other and unspecified hyperlipidemia   . Hx of adenomatous colonic polyps 2008   . Depression   . HTN (hypertension)     unspecified essential  . CAD (coronary artery disease)     DES to RCA and CFX; Dr. Excell Seltzer cardiologist   Assessment: 60yom on antibiotics for suspected graft infection and abscess to continue vancomycin.  Plan to dc home later today after PICC line placed for IV Abx.  Plan:  1. Check vanc trough before discharge.   Talbert Cage [place] [phone] 01/10/2012,12:41 PM    Addum:  Vanc trough 15.6, therapeutic.

## 2012-01-10 NOTE — Progress Notes (Signed)
Peripherally Inserted Central Catheter/Midline Placement  The IV Nurse has discussed with the patient and/or persons authorized to consent for the patient, the purpose of this procedure and the potential benefits and risks involved with this procedure.  The benefits include less needle sticks, lab draws from the catheter and patient may be discharged home with the catheter.  Risks include, but not limited to, infection, bleeding, blood clot (thrombus formation), and puncture of an artery; nerve damage and irregular heat beat.  Alternatives to this procedure were also discussed.  PICC/Midline Placement Documentation        Stacie Glaze Horton 01/10/2012, 4:27 PM

## 2012-01-11 LAB — CULTURE, BLOOD (ROUTINE X 2): Culture: NO GROWTH

## 2012-01-14 ENCOUNTER — Other Ambulatory Visit: Payer: Self-pay | Admitting: Cardiology

## 2012-01-14 DIAGNOSIS — I714 Abdominal aortic aneurysm, without rupture, unspecified: Secondary | ICD-10-CM

## 2012-01-18 ENCOUNTER — Inpatient Hospital Stay (HOSPITAL_COMMUNITY)
Admission: AD | Admit: 2012-01-18 | Discharge: 2012-01-28 | DRG: 237 | Disposition: A | Discharge status: Home Health | Payer: Managed Care, Other (non HMO) | Source: Ambulatory Visit | Attending: Vascular Surgery | Admitting: Vascular Surgery

## 2012-01-18 ENCOUNTER — Inpatient Hospital Stay (HOSPITAL_COMMUNITY): Payer: Managed Care, Other (non HMO)

## 2012-01-18 ENCOUNTER — Encounter (HOSPITAL_COMMUNITY): Payer: Self-pay | Admitting: General Practice

## 2012-01-18 ENCOUNTER — Other Ambulatory Visit: Payer: Self-pay

## 2012-01-18 DIAGNOSIS — I714 Abdominal aortic aneurysm, without rupture, unspecified: Secondary | ICD-10-CM | POA: Diagnosis present

## 2012-01-18 DIAGNOSIS — E785 Hyperlipidemia, unspecified: Secondary | ICD-10-CM | POA: Diagnosis present

## 2012-01-18 DIAGNOSIS — T8119XA Other postprocedural shock, initial encounter: Secondary | ICD-10-CM | POA: Diagnosis not present

## 2012-01-18 DIAGNOSIS — K560 Paralytic ileus: Secondary | ICD-10-CM | POA: Diagnosis not present

## 2012-01-18 DIAGNOSIS — R7309 Other abnormal glucose: Secondary | ICD-10-CM | POA: Diagnosis present

## 2012-01-18 DIAGNOSIS — N400 Enlarged prostate without lower urinary tract symptoms: Secondary | ICD-10-CM | POA: Diagnosis present

## 2012-01-18 DIAGNOSIS — Z9089 Acquired absence of other organs: Secondary | ICD-10-CM

## 2012-01-18 DIAGNOSIS — I739 Peripheral vascular disease, unspecified: Secondary | ICD-10-CM | POA: Diagnosis present

## 2012-01-18 DIAGNOSIS — J9600 Acute respiratory failure, unspecified whether with hypoxia or hypercapnia: Secondary | ICD-10-CM | POA: Diagnosis not present

## 2012-01-18 DIAGNOSIS — T8112XA Postprocedural septic shock, initial encounter: Secondary | ICD-10-CM | POA: Diagnosis not present

## 2012-01-18 DIAGNOSIS — T827XXA Infection and inflammatory reaction due to other cardiac and vascular devices, implants and grafts, initial encounter: Principal | ICD-10-CM | POA: Diagnosis present

## 2012-01-18 DIAGNOSIS — R6520 Severe sepsis without septic shock: Secondary | ICD-10-CM | POA: Diagnosis not present

## 2012-01-18 DIAGNOSIS — Z8249 Family history of ischemic heart disease and other diseases of the circulatory system: Secondary | ICD-10-CM

## 2012-01-18 DIAGNOSIS — A419 Sepsis, unspecified organism: Secondary | ICD-10-CM | POA: Diagnosis not present

## 2012-01-18 DIAGNOSIS — Y832 Surgical operation with anastomosis, bypass or graft as the cause of abnormal reaction of the patient, or of later complication, without mention of misadventure at the time of the procedure: Secondary | ICD-10-CM | POA: Diagnosis present

## 2012-01-18 DIAGNOSIS — Z9861 Coronary angioplasty status: Secondary | ICD-10-CM

## 2012-01-18 DIAGNOSIS — Y92009 Unspecified place in unspecified non-institutional (private) residence as the place of occurrence of the external cause: Secondary | ICD-10-CM

## 2012-01-18 DIAGNOSIS — M1710 Unilateral primary osteoarthritis, unspecified knee: Secondary | ICD-10-CM | POA: Diagnosis present

## 2012-01-18 DIAGNOSIS — E876 Hypokalemia: Secondary | ICD-10-CM | POA: Diagnosis not present

## 2012-01-18 DIAGNOSIS — Z7982 Long term (current) use of aspirin: Secondary | ICD-10-CM

## 2012-01-18 DIAGNOSIS — Z23 Encounter for immunization: Secondary | ICD-10-CM

## 2012-01-18 DIAGNOSIS — L251 Unspecified contact dermatitis due to drugs in contact with skin: Secondary | ICD-10-CM | POA: Diagnosis not present

## 2012-01-18 DIAGNOSIS — Z860100 Personal history of colon polyps, unspecified: Secondary | ICD-10-CM

## 2012-01-18 DIAGNOSIS — IMO0002 Reserved for concepts with insufficient information to code with codable children: Secondary | ICD-10-CM | POA: Diagnosis present

## 2012-01-18 DIAGNOSIS — I959 Hypotension, unspecified: Secondary | ICD-10-CM | POA: Diagnosis not present

## 2012-01-18 DIAGNOSIS — N289 Disorder of kidney and ureter, unspecified: Secondary | ICD-10-CM | POA: Diagnosis not present

## 2012-01-18 DIAGNOSIS — R34 Anuria and oliguria: Secondary | ICD-10-CM | POA: Diagnosis not present

## 2012-01-18 DIAGNOSIS — T490X5A Adverse effect of local antifungal, anti-infective and anti-inflammatory drugs, initial encounter: Secondary | ICD-10-CM | POA: Diagnosis not present

## 2012-01-18 DIAGNOSIS — K929 Disease of digestive system, unspecified: Secondary | ICD-10-CM | POA: Diagnosis not present

## 2012-01-18 DIAGNOSIS — R579 Shock, unspecified: Secondary | ICD-10-CM | POA: Diagnosis not present

## 2012-01-18 DIAGNOSIS — Z87891 Personal history of nicotine dependence: Secondary | ICD-10-CM

## 2012-01-18 DIAGNOSIS — D62 Acute posthemorrhagic anemia: Secondary | ICD-10-CM | POA: Diagnosis not present

## 2012-01-18 DIAGNOSIS — I10 Essential (primary) hypertension: Secondary | ICD-10-CM | POA: Diagnosis present

## 2012-01-18 DIAGNOSIS — Z7902 Long term (current) use of antithrombotics/antiplatelets: Secondary | ICD-10-CM

## 2012-01-18 DIAGNOSIS — N9989 Other postprocedural complications and disorders of genitourinary system: Secondary | ICD-10-CM | POA: Diagnosis not present

## 2012-01-18 DIAGNOSIS — I2510 Atherosclerotic heart disease of native coronary artery without angina pectoris: Secondary | ICD-10-CM | POA: Diagnosis present

## 2012-01-18 HISTORY — DX: Unspecified osteoarthritis, unspecified site: M19.90

## 2012-01-18 HISTORY — DX: Angina pectoris, unspecified: I20.9

## 2012-01-18 LAB — COMPREHENSIVE METABOLIC PANEL
Albumin: 2.5 g/dL — ABNORMAL LOW (ref 3.5–5.2)
Alkaline Phosphatase: 68 U/L (ref 39–117)
BUN: 8 mg/dL (ref 6–23)
Calcium: 8.8 mg/dL (ref 8.4–10.5)
GFR calc Af Amer: >90 mL/min (ref >90)
Glucose, Bld: 144 mg/dL — ABNORMAL HIGH (ref 70–99)
Potassium: 4.1 mEq/L (ref 3.5–5.1)
Sodium: 132 mEq/L — ABNORMAL LOW (ref 135–145)
Total Protein: 6.6 g/dL (ref 6.0–8.3)

## 2012-01-18 LAB — URINALYSIS, ROUTINE W REFLEX MICROSCOPIC
Hgb urine dipstick: NEGATIVE
Nitrite: NEGATIVE
Protein, ur: NEGATIVE mg/dL
Specific Gravity, Urine: 1.019 (ref 1.005–1.030)
Urobilinogen, UA: 0.2 mg/dL (ref 0.0–1.0)

## 2012-01-18 LAB — URINE MICROSCOPIC-ADD ON

## 2012-01-18 LAB — CBC
MCH: 30.1 pg (ref 26.0–34.0)
MCHC: 33.1 g/dL (ref 30.0–36.0)
RDW: 13.1 % (ref 11.5–15.5)

## 2012-01-18 LAB — PROTIME-INR: Prothrombin Time: 15 seconds (ref 11.6–15.2)

## 2012-01-18 MED ORDER — BISACODYL 10 MG RE SUPP: 10.0000 mg | Freq: Every day | RECTAL | Status: DC | PRN

## 2012-01-18 MED ORDER — ONDANSETRON HCL 4 MG/2ML IJ SOLN: 4.0000 mg | Freq: Four times a day (QID) | INTRAMUSCULAR | Status: DC | PRN

## 2012-01-18 MED ORDER — SODIUM CHLORIDE 0.9 % IJ SOLN: 3.0000 mL | INTRAMUSCULAR | Status: DC | PRN

## 2012-01-18 MED ORDER — PNEUMOCOCCAL VAC POLYVALENT 25 MCG/0.5ML IJ INJ
0.5000 mL | INJECTION | INTRAMUSCULAR | Status: AC
  Filled 2012-01-18: qty 0.5

## 2012-01-18 MED ORDER — FENTANYL 50 MCG/HR TD PT72
50.0000 ug | MEDICATED_PATCH | TRANSDERMAL | Status: DC
  Administered 2012-01-19: 50 ug via TRANSDERMAL
  Filled 2012-01-18: qty 1

## 2012-01-18 MED ORDER — PANTOPRAZOLE SODIUM 40 MG PO TBEC: 40.0000 mg | DELAYED_RELEASE_TABLET | Freq: Every day | ORAL | Status: DC

## 2012-01-18 MED ORDER — PANTOPRAZOLE SODIUM 40 MG PO TBEC
40.0000 mg | DELAYED_RELEASE_TABLET | Freq: Every day | ORAL | Status: DC
  Administered 2012-01-19: 40 mg via ORAL
  Filled 2012-01-18 (×3): qty 1

## 2012-01-18 MED ORDER — PHENOL 1.4 % MT LIQD
1.0000 | OROMUCOSAL | Status: DC | PRN
  Filled 2012-01-18: qty 177

## 2012-01-18 MED ORDER — SODIUM CHLORIDE 0.9 % IV SOLN
1.0000 g | Freq: Three times a day (TID) | INTRAVENOUS | Status: DC
  Filled 2012-01-18 (×3): qty 1

## 2012-01-18 MED ORDER — METOPROLOL TARTRATE 1 MG/ML IV SOLN: 2.0000 mg | INTRAVENOUS | Status: DC | PRN

## 2012-01-18 MED ORDER — VANCOMYCIN HCL 1000 MG IV SOLR
1250.0000 mg | Freq: Three times a day (TID) | INTRAVENOUS | Status: DC
  Administered 2012-01-18 – 2012-01-24 (×17): 1250 mg via INTRAVENOUS
  Filled 2012-01-18 (×20): qty 1250

## 2012-01-18 MED ORDER — POTASSIUM CHLORIDE CRYS ER 20 MEQ PO TBCR: 20.0000 meq | EXTENDED_RELEASE_TABLET | Freq: Once | ORAL | Status: DC

## 2012-01-18 MED ORDER — ASPIRIN EC 81 MG PO TBEC
81.0000 mg | DELAYED_RELEASE_TABLET | Freq: Every day | ORAL | Status: DC
  Administered 2012-01-18 – 2012-01-20 (×3): 81 mg via ORAL
  Filled 2012-01-18 (×4): qty 1

## 2012-01-18 MED ORDER — HYDRALAZINE HCL 20 MG/ML IJ SOLN
10.0000 mg | INTRAMUSCULAR | Status: DC | PRN
  Filled 2012-01-18: qty 0.5

## 2012-01-18 MED ORDER — SODIUM CHLORIDE 0.9 % IV SOLN
2.0000 g | Freq: Three times a day (TID) | INTRAVENOUS | Status: DC
  Filled 2012-01-18 (×2): qty 2

## 2012-01-18 MED ORDER — ASPIRIN 81 MG PO TABS: 81.0000 mg | ORAL_TABLET | Freq: Every day | ORAL | Status: DC

## 2012-01-18 MED ORDER — FENTANYL CITRATE 0.05 MG/ML IJ SOLN: 50.0000 ug/h | INTRAMUSCULAR | Status: DC

## 2012-01-18 MED ORDER — GUAIFENESIN-DM 100-10 MG/5ML PO SYRP: 15.0000 mL | ORAL_SOLUTION | ORAL | Status: DC | PRN

## 2012-01-18 MED ORDER — IOHEXOL 350 MG/ML SOLN
100.0000 mL | Freq: Once | INTRAVENOUS | Status: AC | PRN
  Administered 2012-01-18: 100 mL via INTRAVENOUS

## 2012-01-18 MED ORDER — PHENOL 1.4 % MT LIQD: 1.0000 | OROMUCOSAL | Status: DC | PRN

## 2012-01-18 MED ORDER — LABETALOL HCL 5 MG/ML IV SOLN
10.0000 mg | INTRAVENOUS | Status: DC | PRN
  Filled 2012-01-18: qty 4

## 2012-01-18 MED ORDER — SODIUM CHLORIDE 0.9 % IV SOLN
2.0000 g | Freq: Three times a day (TID) | INTRAVENOUS | Status: DC
  Administered 2012-01-18 – 2012-01-28 (×29): 2 g via INTRAVENOUS
  Filled 2012-01-18 (×33): qty 2

## 2012-01-18 MED ORDER — RIFAMPIN 300 MG PO CAPS
300.0000 mg | ORAL_CAPSULE | Freq: Two times a day (BID) | ORAL | Status: DC
  Filled 2012-01-18 (×2): qty 1

## 2012-01-18 MED ORDER — DEXTROSE-NACL 5-0.45 % IV SOLN
INTRAVENOUS | Status: DC
  Administered 2012-01-18: 20 mL/h via INTRAVENOUS

## 2012-01-18 MED ORDER — ALUM & MAG HYDROXIDE-SIMETH 200-200-20 MG/5ML PO SUSP: 15.0000 mL | ORAL | Status: DC | PRN

## 2012-01-18 MED ORDER — CLOPIDOGREL BISULFATE 75 MG PO TABS
75.0000 mg | ORAL_TABLET | Freq: Every day | ORAL | Status: DC
  Administered 2012-01-19: 75 mg via ORAL
  Filled 2012-01-18 (×3): qty 1

## 2012-01-18 MED ORDER — ATORVASTATIN CALCIUM 10 MG PO TABS
10.0000 mg | ORAL_TABLET | Freq: Every day | ORAL | Status: DC
  Administered 2012-01-18 – 2012-01-20 (×3): 10 mg via ORAL
  Filled 2012-01-18 (×4): qty 1

## 2012-01-18 MED ORDER — HYDROMORPHONE HCL PF 1 MG/ML IJ SOLN
0.5000 mg | INTRAMUSCULAR | Status: DC | PRN
  Filled 2012-01-18: qty 1

## 2012-01-18 MED ORDER — HYDROMORPHONE HCL 2 MG PO TABS
2.0000 mg | ORAL_TABLET | Freq: Four times a day (QID) | ORAL | Status: DC | PRN
  Administered 2012-01-19 – 2012-01-20 (×2): 2 mg via ORAL
  Administered 2012-01-21: 4 mg via ORAL
  Filled 2012-01-18: qty 1
  Filled 2012-01-18 (×2): qty 2

## 2012-01-18 MED ORDER — LOSARTAN POTASSIUM 50 MG PO TABS
100.0000 mg | ORAL_TABLET | Freq: Every day | ORAL | Status: DC
  Administered 2012-01-18 – 2012-01-20 (×3): 100 mg via ORAL
  Filled 2012-01-18 (×4): qty 2

## 2012-01-18 MED ORDER — VANCOMYCIN HCL IN DEXTROSE 1-5 GM/200ML-% IV SOLN
1000.0000 mg | Freq: Three times a day (TID) | INTRAVENOUS | Status: DC
  Filled 2012-01-18 (×3): qty 200

## 2012-01-18 MED ORDER — HYDROCHLOROTHIAZIDE 25 MG PO TABS
25.0000 mg | ORAL_TABLET | Freq: Every day | ORAL | Status: DC
  Administered 2012-01-18 – 2012-01-20 (×3): 25 mg via ORAL
  Filled 2012-01-18 (×4): qty 1

## 2012-01-18 MED ORDER — ACETAMINOPHEN 325 MG PO TABS
650.0000 mg | ORAL_TABLET | Freq: Four times a day (QID) | ORAL | Status: DC | PRN
  Administered 2012-01-18 (×2): 325 mg via ORAL
  Administered 2012-01-18 – 2012-01-21 (×5): 650 mg via ORAL
  Filled 2012-01-18 (×6): qty 2

## 2012-01-18 MED ORDER — RIFAMPIN 300 MG PO CAPS
300.0000 mg | ORAL_CAPSULE | Freq: Two times a day (BID) | ORAL | Status: DC
  Administered 2012-01-18 – 2012-01-20 (×5): 300 mg via ORAL
  Filled 2012-01-18 (×7): qty 1

## 2012-01-18 MED ORDER — SODIUM CHLORIDE 0.9 % IV SOLN: 250.0000 mL | INTRAVENOUS | Status: DC | PRN

## 2012-01-18 MED ORDER — SODIUM CHLORIDE 0.9 % IJ SOLN: 3.0000 mL | Freq: Two times a day (BID) | INTRAMUSCULAR | Status: DC

## 2012-01-18 MED ORDER — SENNA 8.6 MG PO TABS
1.0000 | ORAL_TABLET | Freq: Two times a day (BID) | ORAL | Status: DC
  Administered 2012-01-20: 8.6 mg via ORAL
  Filled 2012-01-18 (×7): qty 1

## 2012-01-18 MED ORDER — LOSARTAN POTASSIUM-HCTZ 100-25 MG PO TABS: 1.0000 | ORAL_TABLET | Freq: Every day | ORAL | Status: DC

## 2012-01-18 MED ORDER — POTASSIUM CHLORIDE CRYS ER 20 MEQ PO TBCR
20.0000 meq | EXTENDED_RELEASE_TABLET | Freq: Two times a day (BID) | ORAL | Status: DC
  Administered 2012-01-18 – 2012-01-20 (×5): 20 meq via ORAL
  Filled 2012-01-18 (×8): qty 1

## 2012-01-18 MED ORDER — METOPROLOL SUCCINATE ER 100 MG PO TB24
100.0000 mg | ORAL_TABLET | Freq: Every day | ORAL | Status: DC
  Administered 2012-01-18 – 2012-01-19 (×2): 100 mg via ORAL
  Filled 2012-01-18 (×4): qty 1

## 2012-01-18 MED ORDER — SODIUM CHLORIDE 0.9 % IV SOLN
2.0000 g | Freq: Three times a day (TID) | INTRAVENOUS | Status: DC
  Filled 2012-01-18 (×3): qty 2

## 2012-01-18 MED ORDER — VANCOMYCIN HCL IN DEXTROSE 1-5 GM/200ML-% IV SOLN
1000.0000 mg | Freq: Three times a day (TID) | INTRAVENOUS | Status: DC
  Filled 2012-01-18 (×2): qty 200

## 2012-01-18 NOTE — Progress Notes (Addendum)
ANTIBIOTIC CONSULT NOTE - INITIAL  Pharmacy Consult for vancomycin and Merrem Indication: fever; stent graft infection  No Known Allergies  Patient Measurements:     Vital Signs: Temp: 98.6 F (37 C) (08/20 1214) Temp src: Oral (08/20 1214) BP: 117/78 mmHg (08/20 1214) Pulse Rate: 89  (08/20 1214) Intake/Output from previous day:   Intake/Output from this shift:    Labs: No results found for this basename: WBC:3,HGB:3,PLT:3,LABCREA:3,CREATININE:3 in the last 72 hours The CrCl is unknown because both a height and weight (above a minimum accepted value) are required for this calculation. No results found for this basename: VANCOTROUGH:2,VANCOPEAK:2,VANCORANDOM:2,GENTTROUGH:2,GENTPEAK:2,GENTRANDOM:2,TOBRATROUGH:2,TOBRAPEAK:2,TOBRARND:2,AMIKACINPEAK:2,AMIKACINTROU:2,AMIKACIN:2, in the last 72 hours   Microbiology: Recent Results (from the past 720 hour(s))  URINE CULTURE     Status: Normal   Collection Time   12/21/11  1:16 PM      Component Value Range Status Comment   Colony Count NO GROWTH   Final    Organism ID, Bacteria NO GROWTH   Final   CULTURE, BLOOD (ROUTINE X 2)     Status: Normal   Collection Time   12/25/11 11:05 AM      Component Value Range Status Comment   Specimen Description BLOOD RIGHT ARM   Final    Special Requests BOTTLES DRAWN AEROBIC ONLY 8CC   Final    Culture  Setup Time 12/25/2011 19:29   Final    Culture NO GROWTH 5 DAYS   Final    Report Status 12/31/2011 FINAL   Final   CULTURE, BLOOD (ROUTINE X 2)     Status: Normal   Collection Time   12/25/11 11:15 AM      Component Value Range Status Comment   Specimen Description BLOOD RIGHT HAND   Final    Special Requests BOTTLES DRAWN AEROBIC ONLY [facility]   Final    Culture  Setup Time 12/25/2011 19:29   Final    Culture NO GROWTH 5 DAYS   Final    Report Status 12/31/2011 FINAL   Final   CULTURE, BLOOD (ROUTINE X 2)     Status: Normal   Collection Time   12/26/11  2:30 PM      Component Value Range  Status Comment   Specimen Description BLOOD RIGHT ARM   Final    Special Requests BOTTLES DRAWN AEROBIC ONLY [facility]   Final    Culture  Setup Time 12/26/2011 18:09   Final    Culture NO GROWTH 5 DAYS   Final    Report Status 01/01/2012 FINAL   Final   CULTURE, BLOOD (ROUTINE X 2)     Status: Normal   Collection Time   12/26/11  2:40 PM      Component Value Range Status Comment   Specimen Description BLOOD RIGHT HAND   Final    Special Requests BOTTLES DRAWN AEROBIC ONLY 4CC   Final    Culture  Setup Time 12/26/2011 18:09   Final    Culture NO GROWTH 5 DAYS   Final    Report Status 01/01/2012 FINAL   Final   CULTURE, BLOOD (SINGLE)     Status: Normal   Collection Time   01/04/12 12:12 PM      Component Value Range Status Comment   Specimen Description BLOOD ARM LEFT   Final    Special Requests BOTTLES DRAWN AEROBIC AND ANAEROBIC 10CC EACH   Final    Culture  Setup Time 01/04/2012 20:16   Final    Culture  Final    Value: NO GROWTH 5 DAYS     Note: PREVIOUSLY REPORTED AS Gram Negative Coccibacilli CORRECTED RESULTS CALLED TO: SYLVIA HOWELL @0810  01/10/12 BY KRAWS Gram Stain Report Called to,Read Back By and Verified With: NEGO CROSSON @1103  01/05/12   Report Status 01/10/2012 FINAL   Final   CULTURE, ROUTINE-ABSCESS     Status: Normal   Collection Time   01/04/12  2:57 PM      Component Value Range Status Comment   Specimen Description ABSCESS ASPIRATE LEFT   Final    Special Requests ASPIRATE OF LEFT RETROPERITONEAL ABSCESS   Final    Gram Stain     Final    Value: ABUNDANT WBC PRESENT,BOTH PMN AND MONONUCLEAR     NO SQUAMOUS EPITHELIAL CELLS SEEN     NO ORGANISMS SEEN   Culture NO GROWTH 3 DAYS   Final    Report Status 01/07/2012 FINAL   Final   ANAEROBIC CULTURE     Status: Normal   Collection Time   01/04/12  2:57 PM      Component Value Range Status Comment   Specimen Description ASCITIC ASPIRATE LEFT   Final    Special Requests ASPIRATE OF LEFT RETROPERITONEAL ABSCESS   Final     Gram Stain     Final    Value: ABUNDANT WBC PRESENT,BOTH PMN AND MONONUCLEAR     NO SQUAMOUS EPITHELIAL CELLS SEEN     NO ORGANISMS SEEN   Culture NO ANAEROBES ISOLATED   Final    Report Status 01/09/2012 FINAL   Final   CULTURE, BLOOD (ROUTINE X 2)     Status: Normal   Collection Time   01/05/12  3:39 PM      Component Value Range Status Comment   Specimen Description BLOOD ARM LEFT   Final    Special Requests BOTTLES DRAWN AEROBIC AND ANAEROBIC 10CC   Final    Culture  Setup Time 01/05/2012 22:13   Final    Culture NO GROWTH 5 DAYS   Final    Report Status 01/11/2012 FINAL   Final   CULTURE, BLOOD (ROUTINE X 2)     Status: Normal   Collection Time   01/05/12  3:45 PM      Component Value Range Status Comment   Specimen Description BLOOD HAND LEFT   Final    Special Requests BOTTLES DRAWN AEROBIC ONLY 1CC   Final    Culture  Setup Time 01/05/2012 22:13   Final    Culture NO GROWTH 5 DAYS   Final    Report Status 01/11/2012 FINAL   Final   CATH TIP CULTURE     Status: Normal   Collection Time   01/06/12  7:00 PM      Component Value Range Status Comment   Specimen Description CATH TIP PICC LINE ARM RIGHT   Final    Special Requests NONE   Final    Culture NO GROWTH 2 DAYS   Final    Report Status 01/09/2012 FINAL   Final     Medical History: Past Medical History  Diagnosis Date  . AAA (abdominal aortic aneurysm)   . Dyslipidemia   . Left lumbar radiculopathy   . Hyperplasia of prostate without lower urinary tract symptoms (LUTS)   . Other and unspecified hyperlipidemia   . Hx of adenomatous colonic polyps 2008  . Depression   . HTN (hypertension)     unspecified essential  . CAD (coronary artery disease)  DES to RCA and CFX; Dr. Excell Seltzer cardiologist    Medications:  See med rec  Assessment: Patient is a 61 y.o M s/p AAA stent graft repair in 08/2011. 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  Abx was then changed to vancomycin, meropenem, and PO rifampin at discharge with plan to treat for 6-8 weeks per ID.  Now admitted for persistent fever.  At home patient was on vancomycin 1gm q8h, merrem 1gm q8h, and rifampin 300mg  PO BID.  Patient stated that he had his vancomycin and Merrem around 7 AM today prior to admit.  Vancomycin trough level on 8/12 with 1gm IV q8h regimen was therapeutic at 15.6.    Goal of Therapy:  Vancomycin trough level 15-20 mcg/ml  Plan:  1) continue home vanc dose 1gm IV q8h and Merrem 1gm IV q8h  Adden: [facility] reported that patient's vancomycin trough level drawn on 8/19 at 6:30 AM was 12.2.  Patient said that he has not missed any vanc doses at home.  Will change vancomycin to 1250mg  IV q8h.  Patient is actually supposed to be on merrem 2gm IV q8h (not 1gm) per Dr. Zenaida Niece Dam's discharge orders.  [facility] made an error during order processing and 1gm was entered instead of 2gm.  Hence, patient has been on 1gm IV q8h since 01/10/12.  Will change merrem order to 2gm IV q8h to reflect Dr. Clinton Gallant original order.   Starlene Consuegra P 01/18/2012,12:53 PM

## 2012-01-18 NOTE — H&P (Signed)
[facility] [place] HISTORY AND PHYSICAL   History of Present Illness:  Patient is a 61 y.o. year old male who presents for evaluation of fever.  He has been on antibiotics with suspected stent graft infection.  He has been having fever 99-100 evenings.  Today was 101 in the morning.  Some flank pain controlled with pain meds. This is no worse.  Past Medical History  Diagnosis Date  . AAA (abdominal aortic aneurysm)   . Dyslipidemia   . Left lumbar radiculopathy   . Hyperplasia of prostate without lower urinary tract symptoms (LUTS)   . Other and unspecified hyperlipidemia   . Hx of adenomatous colonic polyps 2008  . Depression   . HTN (hypertension)     unspecified essential  . CAD (coronary artery disease)     DES to RCA and CFX; Dr. Excell Seltzer cardiologist    Past Surgical History  Procedure Date  . Cardiac catheterization 2005  . Appendectomy     age 19  . Colonoscopy w/ polypectomy 2008    adenomatous  & hyperplastic polyp;due 2013  . Cystoscopy 2005    varicose veins in bladder, Dr Retta Diones  . Coronary artey stent 2002 & 2005  . Tonsillectomy and adenoidectomy     removed at age 60  . Artery repair 09/06/2011    Procedure: ARTERY REPAIR;  Surgeon: Sherren Kerns, MD;  Location: [facility] OR;  Service: Vascular;  Laterality: Bilateral;     Social History History  Substance Use Topics  . Smoking status: Former Smoker -- 1.0 packs/day for 27 years    Quit date: 05/31/1993  . Smokeless tobacco: Current User    Types: Snuff  . Alcohol Use: 12.6 oz/week    21 Shots of liquor per week     2-3 drinks of Vodka a night, occasional beer    Family History Family History  Problem Relation Age of Onset  . Coronary artery disease Father     CABG in 54s  . Diabetes Father   . Colon polyps Father   . Heart attack Father     ?  Marland Kitchen Cancer Father     non-small cell carcinoma/bone CA  . Hypertension Father   . Heart disease Father   . Coronary artery disease  Maternal Grandfather   . Coronary artery disease Paternal Grandfather   . Other Mother     varicose veins  . Anesthesia problems Neg Hx     Allergies  No Known Allergies   Current Facility-Administered Medications  Medication Dose Route Frequency Provider Last Rate Last Dose  . 0.9 %  sodium chloride infusion  250 mL Intravenous PRN Marlowe Shores, PA      . aspirin EC tablet 81 mg  81 mg Oral QHS Sherren Kerns, MD      . atorvastatin (LIPITOR) tablet 10 mg  10 mg Oral QHS Amelia Jo [place], [place]      . bisacodyl (DULCOLAX) suppository 10 mg  10 mg Rectal Daily PRN Marlowe Shores, PA      . clopidogrel (PLAVIX) tablet 75 mg  75 mg Oral Q breakfast Marlowe Shores, [place]      . fentaNYL (DURAGESIC - dosed mcg/hr) 50 mcg  50 mcg Transdermal Q72H Marlowe Shores, PA      . guaiFENesin-dextromethorphan (ROBITUSSIN DM) 100-10 MG/5ML syrup 15 mL  15 mL Oral Q4H PRN Marlowe Shores, PA      . hydrALAZINE (APRESOLINE) injection 10 mg  10 mg Intravenous Q2H PRN Marlowe Shores, [place]      . losartan (COZAAR) tablet 100 mg  100 mg Oral Daily Sherren Kerns, MD       And  . hydrochlorothiazide (HYDRODIURIL) tablet 25 mg  25 mg Oral Daily Sherren Kerns, MD      . HYDROmorphone (DILAUDID) tablet 2-4 mg  2-4 mg Oral Q6H PRN Marlowe Shores, PA      . labetalol (NORMODYNE,TRANDATE) injection 10 mg  10 mg Intravenous Q2H PRN Marlowe Shores, PA      . meropenem (MERREM) 2 g in sodium chloride 0.9 % 100 mL IVPB  2 g Intravenous Q8H Regina J [place], [place]      . metoprolol (LOPRESSOR) injection 2-5 mg  2-5 mg Intravenous Q2H PRN Marlowe Shores, PA      . metoprolol succinate (TOPROL-XL) 24 hr tablet 100 mg  100 mg Oral QHS Marlowe Sh[place], [place]      . ondansetron (ZOFRAN) injection 4 mg  4 mg Intravenous Q6H PRN Marlowe Shores, PA      . pantoprazole (PROTONIX) EC tablet 40 mg  40 mg Oral Q1200 Regina J [place], PA      . phenol (CHLORASEPTIC) mouth spray 1 spray  1 spray  Mouth/Throat PRN Marlowe Shores, PA      . potassium chloride SA (K-DUR,KLOR-CON) CR tablet 20 mEq  20 mEq Oral BID Marlowe Sh[place], [place]      . rifampin (RIFADIN) capsule 300 mg  300 mg Oral BID Marlowe Sh[place], [place]      . senna (SENOKOT) tablet 8.6 mg  1 tablet Oral BID Amelia Jo Roczniak, PA      . sodium chloride 0.9 % injection 3 mL  3 mL Intravenous Q12H Regina J Roc[place], [place]      . sodium chloride 0.9 % injection 3 mL  3 mL Intravenous PRN Marlowe Shores, PA      . vancomycin (VANCOCIN) IVPB 1000 mg/200 mL premix  1,000 mg Intravenous Q8H Amelia Jo [place], [place]      . DISCONTD: aspirin tablet 81 mg  81 mg Oral QHS Marlowe Shores, [place]      . DISCONTD: losartan-hydrochlorothiazide (HYZAAR) 100-25 MG per tablet 1 tablet  1 tablet Oral QHS Regina J Roczniak, PA        ROS:   General:  No weight loss, + Fever, chills  HEENT: No recent headaches, no nasal bleeding, no visual changes, no sore throat  Neurologic: No dizziness, blackouts, seizures. No recent symptoms of stroke or mini- stroke. No recent episodes of slurred speech, or temporary blindness.  Cardiac: No recent episodes of chest pain/pressure, no shortness of breath at rest.  No shortness of breath with exertion.  Denies history of atrial fibrillation or irregular heartbeat  Vascular: No history of rest pain in feet.  No history of claudication.  No history of non-healing ulcer, No history of DVT   Pulmonary: No home oxygen, no productive cough, no hemoptysis,  No asthma or wheezing  Musculoskeletal:  [ ]  Arthritis, [ ]  Low back pain,  [ ]  Joint pain  Hematologic:No history of hypercoagulable state.  No history of easy bleeding.  No history of anemia  Gastrointestinal: No hematochezia or melena,  No gastroesophageal reflux, no trouble swallowing  Urinary: [ ]  chronic Kidney disease, [ ]  on HD - [ ]  MWF or [ ]  TTHS, [ ]  Burning with urination, [ ]  Frequent  urination, [ ]  Difficulty urinating;   Skin: No  rashes  Psychological: No history of anxiety,  No history of depression   Physical Examination  Filed Vitals:   01/18/12 1214  BP: 117/78  Pulse: 89  Temp: 98.6 F (37 C)  TempSrc: Oral  Resp: 18  SpO2: 94%    There is no height or weight on file to calculate BMI.  General:  Alert and oriented, no acute distress HEENT: Normal Neck: No bruit or JVD Pulmonary: Clear to auscultation bilaterally Cardiac: Regular Rate and Rhythm without murmur Abdomen: Soft, non-tender, non-distended, no mass Skin: No rash Extremity Pulses:  2+ radial, brachial, femoral Musculoskeletal: No deformity or edema  Neurologic: Upper and lower extremity motor 5/5 and symmetric    ASSESSMENT:  Recurrent fever may need graft removal CT angio with runoff today Tagged WBC scan tomorrow Possible graft removal Friday Plan discussed with pt and wife  Fabienne Bruns, MD [facility] [place] Office: [phone] Pager: [phone]

## 2012-01-19 ENCOUNTER — Inpatient Hospital Stay (HOSPITAL_COMMUNITY): Payer: Managed Care, Other (non HMO)

## 2012-01-19 ENCOUNTER — Encounter: Payer: Self-pay | Admitting: Vascular Surgery

## 2012-01-19 LAB — VANCOMYCIN, TROUGH: Vancomycin Tr: 15.5 ug/mL (ref 10.0–20.0)

## 2012-01-19 MED ORDER — ALTEPLASE 2 MG IJ SOLR
2.0000 mg | Freq: Once | INTRAMUSCULAR | Status: AC
  Administered 2012-01-19: 2 mg
  Filled 2012-01-19: qty 2

## 2012-01-19 MED ORDER — INDIUM-111 PENTETATE
354.0000 | Freq: Once | INTRAVENOUS | Status: AC | PRN
  Administered 2012-01-19: 354 via INTRAVENOUS

## 2012-01-19 NOTE — Progress Notes (Signed)
No complaints, no abdominal pain  Filed Vitals:   01/19/12 1317  BP: 104/71  Pulse: 96  Temp: 98.5 F (36.9 C)  Resp: 18   Abdomen soft non tender  CBC    Component Value Date/Time   WBC 2.9* 01/18/2012 1250   RBC 3.49* 01/18/2012 1250   HGB 10.5* 01/18/2012 1250   HCT 31.7* 01/18/2012 1250   PLT 227 01/18/2012 1250   MCV 90.8 01/18/2012 1250   MCH 30.1 01/18/2012 1250   MCHC 33.1 01/18/2012 1250   RDW 13.1 01/18/2012 1250   LYMPHSABS 1.2 12/24/2011 1722   MONOABS 0.6 12/24/2011 1722   EOSABS 0.0 12/24/2011 1722   BASOSABS 0.0 12/24/2011 1722    CT angio reviewed 3 vessel runoff right 2 vessel left PT occluded reconst via peroneal, perigraft inflammation and stranding no significant change  Assessment: Stent graft infection  Plan: Tagged wbc scan today  Plan for removal of stent graft with in situ reconstruction Cadaveric aorta on Friday  Questions answered. Plan discussed with pt and wife as well as risks benefits options including in situ dacron, cadaveric and ax bifem  Fabienne Bruns, MD [facility] [place] Office: [phone] Pager: [phone]

## 2012-01-19 NOTE — Progress Notes (Signed)
ANTIBIOTIC CONSULT NOTE - FOLLOW UP  Pharmacy Consult for vancomycin Indication: stent graft infection  No Known Allergies  Patient Measurements: Height: 6\' 2"  (188 cm) Weight: 208 lb 12.4 oz (94.7 kg) IBW/kg (Calculated) : 82.2   Vital Signs: Temp: 98.5 F (36.9 C) (08/21 1317) Temp src: Oral (08/21 1317) BP: 104/71 mmHg (08/21 1317) Pulse Rate: 96  (08/21 1317) Intake/Output from previous day: 08/20 0701 - 08/21 0700 In: -  Out: 850 [Urine:850] Intake/Output from this shift: Total I/O In: 720 [P.O.:720] Out: -   Labs:  Basename 01/18/12 1250  WBC 2.9*  HGB 10.5*  PLT 227  LABCREA --  CREATININE 0.65   Estimated Creatinine Clearance: 114.2 ml/min (by C-G formula based on Cr of 0.65).  Basename 01/19/12 1435  VANCOTROUGH 15.5  VANCOPEAK --  VANCORANDOM --  GENTTROUGH --  GENTPEAK --  GENTRANDOM --  TOBRATROUGH --  TOBRAPEAK --  TOBRARND --  AMIKACINPEAK --  AMIKACINTROU --  AMIKACIN --     Microbiology: Recent Results (from the past 720 hour(s))  URINE CULTURE     Status: Normal   Collection Time   12/21/11  1:16 PM      Component Value Range Status Comment   Colony Count NO GROWTH   Final    Organism ID, Bacteria NO GROWTH   Final   CULTURE, BLOOD (ROUTINE X 2)     Status: Normal   Collection Time   12/25/11 11:05 AM      Component Value Range Status Comment   Specimen Description BLOOD RIGHT ARM   Final    Special Requests BOTTLES DRAWN AEROBIC ONLY 8CC   Final    Culture  Setup Time 12/25/2011 19:29   Final    Culture NO GROWTH 5 DAYS   Final    Report Status 12/31/2011 FINAL   Final   CULTURE, BLOOD (ROUTINE X 2)     Status: Normal   Collection Time   12/25/11 11:15 AM      Component Value Range Status Comment   Specimen Description BLOOD RIGHT HAND   Final    Special Requests BOTTLES DRAWN AEROBIC ONLY [facility]   Final    Culture  Setup Time 12/25/2011 19:29   Final    Culture NO GROWTH 5 DAYS   Final    Report Status 12/31/2011 FINAL   Final    CULTURE, BLOOD (ROUTINE X 2)     Status: Normal   Collection Time   12/26/11  2:30 PM      Component Value Range Status Comment   Specimen Description BLOOD RIGHT ARM   Final    Special Requests BOTTLES DRAWN AEROBIC ONLY [facility]   Final    Culture  Setup Time 12/26/2011 18:09   Final    Culture NO GROWTH 5 DAYS   Final    Report Status 01/01/2012 FINAL   Final   CULTURE, BLOOD (ROUTINE X 2)     Status: Normal   Collection Time   12/26/11  2:40 PM      Component Value Range Status Comment   Specimen Description BLOOD RIGHT HAND   Final    Special Requests BOTTLES DRAWN AEROBIC ONLY 4CC   Final    Culture  Setup Time 12/26/2011 18:09   Final    Culture NO GROWTH 5 DAYS   Final    Report Status 01/01/2012 FINAL   Final   CULTURE, BLOOD (SINGLE)     Status: Normal   Collection Time  01/04/12 12:12 PM      Component Value Range Status Comment   Specimen Description BLOOD ARM LEFT   Final    Special Requests BOTTLES DRAWN AEROBIC AND ANAEROBIC 10CC EACH   Final    Culture  Setup Time 01/04/2012 20:16   Final    Culture     Final    Value: NO GROWTH 5 DAYS     Note: PREVIOUSLY REPORTED AS Gram Negative Coccibacilli CORRECTED RESULTS CALLED TO: SYLVIA HOWELL @0810  01/10/12 BY KRAWS Gram Stain Report Called to,Read Back By and Verified With: NEGO CROSSON @1103  01/05/12   Report Status 01/10/2012 FINAL   Final   CULTURE, ROUTINE-ABSCESS     Status: Normal   Collection Time   01/04/12  2:57 PM      Component Value Range Status Comment   Specimen Description ABSCESS ASPIRATE LEFT   Final    Special Requests ASPIRATE OF LEFT RETROPERITONEAL ABSCESS   Final    Gram Stain     Final    Value: ABUNDANT WBC PRESENT,BOTH PMN AND MONONUCLEAR     NO SQUAMOUS EPITHELIAL CELLS SEEN     NO ORGANISMS SEEN   Culture NO GROWTH 3 DAYS   Final    Report Status 01/07/2012 FINAL   Final   ANAEROBIC CULTURE     Status: Normal   Collection Time   01/04/12  2:57 PM      Component Value Range Status Comment    Specimen Description ASCITIC ASPIRATE LEFT   Final    Special Requests ASPIRATE OF LEFT RETROPERITONEAL ABSCESS   Final    Gram Stain     Final    Value: ABUNDANT WBC PRESENT,BOTH PMN AND MONONUCLEAR     NO SQUAMOUS EPITHELIAL CELLS SEEN     NO ORGANISMS SEEN   Culture NO ANAEROBES ISOLATED   Final    Report Status 01/09/2012 FINAL   Final   CULTURE, BLOOD (ROUTINE X 2)     Status: Normal   Collection Time   01/05/12  3:39 PM      Component Value Range Status Comment   Specimen Description BLOOD ARM LEFT   Final    Special Requests BOTTLES DRAWN AEROBIC AND ANAEROBIC 10CC   Final    Culture  Setup Time 01/05/2012 22:13   Final    Culture NO GROWTH 5 DAYS   Final    Report Status 01/11/2012 FINAL   Final   CULTURE, BLOOD (ROUTINE X 2)     Status: Normal   Collection Time   01/05/12  3:45 PM      Component Value Range Status Comment   Specimen Description BLOOD HAND LEFT   Final    Special Requests BOTTLES DRAWN AEROBIC ONLY 1CC   Final    Culture  Setup Time 01/05/2012 22:13   Final    Culture NO GROWTH 5 DAYS   Final    Report Status 01/11/2012 FINAL   Final   CATH TIP CULTURE     Status: Normal   Collection Time   01/06/12  7:00 PM      Component Value Range Status Comment   Specimen Description CATH TIP PICC LINE ARM RIGHT   Final    Special Requests NONE   Final    Culture NO GROWTH 2 DAYS   Final    Report Status 01/09/2012 FINAL   Final     Anti-infectives     Start     Dose/Rate Route Frequency Ordered  Stop   01/18/12 2200   rifampin (RIFADIN) capsule 300 mg        300 mg Oral Every 12 hours 01/18/12 1356     01/18/12 1600   meropenem (MERREM) 1 g in sodium chloride 0.9 % 100 mL IVPB  Status:  Discontinued        1 g 200 mL/hr over 30 Minutes Intravenous Every 8 hours 01/18/12 1518 01/18/12 1537   01/18/12 1600   meropenem (MERREM) 2 g in sodium chloride 0.9 % 100 mL IVPB        2 g 200 mL/hr over 30 Minutes Intravenous Every 8 hours 01/18/12 1547     01/18/12 1500    vancomycin (VANCOCIN) IVPB 1000 mg/200 mL premix  Status:  Discontinued        1,000 mg 200 mL/hr over 60 Minutes Intravenous Every 8 hours 01/18/12 1351 01/18/12 1447   01/18/12 1500   meropenem (MERREM) 2 g in sodium chloride 0.9 % 100 mL IVPB  Status:  Discontinued        2 g 200 mL/hr over 30 Minutes Intravenous Every 8 hours 01/18/12 1354 01/18/12 1517   01/18/12 1500   vancomycin (VANCOCIN) 1,250 mg in sodium chloride 0.9 % 250 mL IVPB        1,250 mg 166.7 mL/hr over 90 Minutes Intravenous Every 8 hours 01/18/12 1448     01/18/12 1400   meropenem (MERREM) 2 g in sodium chloride 0.9 % 100 mL IVPB  Status:  Discontinued        2 g 200 mL/hr over 30 Minutes Intravenous 3 times per day 01/18/12 1152 01/18/12 1354   01/18/12 1300   rifampin (RIFADIN) capsule 300 mg  Status:  Discontinued        300 mg Oral 2 times daily 01/18/12 1152 01/18/12 1356   01/18/12 1200   vancomycin (VANCOCIN) IVPB 1000 mg/200 mL premix  Status:  Discontinued        1,000 mg 200 mL/hr over 60 Minutes Intravenous Every 8 hours 01/18/12 1152 01/18/12 1351          Assessment: Steady state vancomycin trough level now back therapeutic at 15.5 after dose increased to 1250mg  IV Q8h yesterday.  Goal of Therapy:  Vancomycin trough level 15-20 mcg/ml  Plan:  1) continue current vancomycin regimem  Alfred [name] P 01/19/2012,3:46 PM

## 2012-01-19 NOTE — Care Management Note (Unsigned)
    Page 1 of 2   01/26/2012     11:32:14 AM   CARE MANAGEMENT NOTE 01/26/2012  Patient:  Alfred [name],Alfred [name]   Account Number:  [id number]  Date Initiated:  01/18/2012  Documentation initiated by:  [facility]  Subjective/Objective Assessment:   Admitted with continued infection of aortic graft.  Has wife.     Action/Plan:   Anticipated DC Date:  01/27/2012   Anticipated DC Plan:  HOME W HOME HEALTH SERVICES      DC Planning Services  CM consult      Choice offered to / List presented to:  C-1 Patient        HH arranged  HH-1 RN      [facility] agency  [facility]   Status of service:  In process, will continue to follow Medicare Important Message given?   (If response is "NO", the following Medicare IM given date fields will be blank) Date Medicare IM given:   Date Additional Medicare IM given:    Discharge Disposition:    Per UR Regulation:  Reviewed for med. necessity/level of care/duration of stay  If discussed at Long Length of Stay Meetings, dates discussed:   01/25/2012    Comments:  Contact:  Gates Rigg - wife - [phone]   [phone]                 Areg, Bialas [phone]  01/26/12  1130  Samiah Ricklefs SIMMONS RN, BSN [phone] PT STATED HE ALREADY HAS A HOSPITAL BED SET UP AT HIS HOME WITH PLENTY OF FAMILY TO ASSIST WITH HIS CARE POST D/C.  01/25/12  1132  Alvey Brockel SIMMONS RN, BSN [phone] REQUESTED LTAC REFERRAL, MD DECLINED AS PT WILL DISCHARGE BACK TO HOME BY THE END OF THE WEEK.  NCM WILL FOLLOW.   01-19-12 9:30am Avie Arenas, RNBSN - [phone] Talked with patient.  Has just seen Dr. Darrick Penna and has been given options - considering those now.  Confirmed has been using [facility] at home with IV antibiotics, pleased with service would like to continue with them post hospitalization. Jodene Nam - [facility] notified.  01-18-12 1:15pm Avie Arenas, RNBSN [phone] Was Independent prior to last admission, works as a Visual merchandiser. Active with [facility]  for [facility] and IV antibiotics.

## 2012-01-20 ENCOUNTER — Ambulatory Visit: Payer: Managed Care, Other (non HMO) | Admitting: Vascular Surgery

## 2012-01-20 MED ORDER — TECHNETIUM TC 99M EXAMETAZIME IV KIT
0.0052 | PACK | Freq: Once | INTRAVENOUS | Status: AC | PRN
  Administered 2012-01-19: 1 via INTRAVENOUS

## 2012-01-20 NOTE — Progress Notes (Addendum)
[facility] [place]  Subjective  -   Alfred [name] was admitted due to fever and complaints of flank pain with suspected stent graft infection.  No new complaints this am.     Objective 99/64 82 99.2 F (37.3 C) (Oral) 18 96%  Intake/Output Summary (Last 24 hours) at 01/20/12 0804 Last data filed at 01/19/12 1700  Gross per 24 hour  Intake    820 ml  Output      0 ml  Net    820 ml   Bilateral LE N/V/M intact Lungs clear no acute distress Heart RRR   Assessment/Planning: OR for open AAA and removal of stent graft NPO past MN     COLLINS, EMMA [facility] 01/20/2012 8:04 AM --  Laboratory Lab Results:  Basename 01/18/12 1250  WBC 2.9*  HGB 10.5*  HCT 31.7*  PLT 227   BMET  Basename 01/18/12 1250  NA 132*  K 4.1  CL 95*  CO2 30  GLUCOSE 144*  BUN 8  CREATININE 0.65  CALCIUM 8.8    COAG Lab Results  Component Value Date   INR 1.16 01/18/2012   INR 1.24 01/04/2012   INR 1.50* 09/06/2011   No results found for this basename: PTT    Antibiotics Anti-infectives     Start     Dose/Rate Route Frequency Ordered Stop   01/18/12 2200   rifampin (RIFADIN) capsule 300 mg        300 mg Oral Every 12 hours 01/18/12 1356     01/18/12 1600   meropenem (MERREM) 1 g in sodium chloride 0.9 % 100 mL IVPB  Status:  Discontinued        1 g 200 mL/hr over 30 Minutes Intravenous Every 8 hours 01/18/12 1518 01/18/12 1537   01/18/12 1600   meropenem (MERREM) 2 g in sodium chloride 0.9 % 100 mL IVPB        2 g 200 mL/hr over 30 Minutes Intravenous Every 8 hours 01/18/12 1547     01/18/12 1500   vancomycin (VANCOCIN) IVPB 1000 mg/200 mL premix  Status:  Discontinued        1,000 mg 200 mL/hr over 60 Minutes Intravenous Every 8 hours 01/18/12 1351 01/18/12 1447   01/18/12 1500   meropenem (MERREM) 2 g in sodium chloride 0.9 % 100 mL IVPB  Status:  Discontinued        2 g 200 mL/hr over 30 Minutes Intravenous Every 8 hours 01/18/12 1354 01/18/12  1517   01/18/12 1500   vancomycin (VANCOCIN) 1,250 mg in sodium chloride 0.9 % 250 mL IVPB        1,250 mg 166.7 mL/hr over 90 Minutes Intravenous Every 8 hours 01/18/12 1448     01/18/12 1400   meropenem (MERREM) 2 g in sodium chloride 0.9 % 100 mL IVPB  Status:  Discontinued        2 g 200 mL/hr over 30 Minutes Intravenous 3 times per day 01/18/12 1152 01/18/12 1354   01/18/12 1300   rifampin (RIFADIN) capsule 300 mg  Status:  Discontinued        300 mg Oral 2 times daily 01/18/12 1152 01/18/12 1356   01/18/12 1200   vancomycin (VANCOCIN) IVPB 1000 mg/200 mL premix  Status:  Discontinued        1,000 mg 200 mL/hr over 60 Minutes Intravenous Every 8 hours 01/18/12 1152 01/18/12 1351             Details  as above exam unchanged Several questions answered  For stent graft removal tomorrow Set up 6 u rbc  Fabienne Bruns, MD [facility] [place] Office: [phone] Pager: [phone]

## 2012-01-21 ENCOUNTER — Encounter (HOSPITAL_COMMUNITY): Payer: Self-pay | Admitting: Anesthesiology

## 2012-01-21 ENCOUNTER — Ambulatory Visit: Payer: Managed Care, Other (non HMO) | Admitting: Cardiovascular Disease

## 2012-01-21 ENCOUNTER — Inpatient Hospital Stay (HOSPITAL_COMMUNITY): Payer: Managed Care, Other (non HMO)

## 2012-01-21 ENCOUNTER — Inpatient Hospital Stay (HOSPITAL_COMMUNITY): Payer: Managed Care, Other (non HMO) | Admitting: Anesthesiology

## 2012-01-21 ENCOUNTER — Encounter (HOSPITAL_COMMUNITY)
Admission: AD | Disposition: A | Discharge status: Home Health | Payer: Self-pay | Source: Ambulatory Visit | Attending: Vascular Surgery

## 2012-01-21 DIAGNOSIS — I714 Abdominal aortic aneurysm, without rupture, unspecified: Secondary | ICD-10-CM

## 2012-01-21 DIAGNOSIS — T82898A Other specified complication of vascular prosthetic devices, implants and grafts, initial encounter: Secondary | ICD-10-CM

## 2012-01-21 DIAGNOSIS — R34 Anuria and oliguria: Secondary | ICD-10-CM | POA: Diagnosis not present

## 2012-01-21 DIAGNOSIS — J9600 Acute respiratory failure, unspecified whether with hypoxia or hypercapnia: Secondary | ICD-10-CM | POA: Diagnosis not present

## 2012-01-21 DIAGNOSIS — R579 Shock, unspecified: Secondary | ICD-10-CM | POA: Diagnosis not present

## 2012-01-21 HISTORY — PX: ABDOMINAL AORTIC ANEURYSM REPAIR: SHX42

## 2012-01-21 LAB — POCT I-STAT 4, (NA,K, GLUC, HGB,HCT)
Glucose, Bld: 181 mg/dL — ABNORMAL HIGH (ref 70–99)
HCT: 21 % — ABNORMAL LOW (ref 39.0–52.0)
Hemoglobin: 7.1 g/dL — ABNORMAL LOW (ref 13.0–17.0)
Potassium: 5.1 mEq/L (ref 3.5–5.1)
Sodium: 133 mEq/L — ABNORMAL LOW (ref 135–145)

## 2012-01-21 LAB — POCT I-STAT 7, (LYTES, BLD GAS, ICA,H+H)
Acid-Base Excess: 4 mmol/L — ABNORMAL HIGH (ref 0.0–2.0)
Acid-Base Excess: 4 mmol/L — ABNORMAL HIGH (ref 0.0–2.0)
Acid-base deficit: 3 mmol/L — ABNORMAL HIGH (ref 0.0–2.0)
Bicarbonate: 30.5 mEq/L — ABNORMAL HIGH (ref 20.0–24.0)
Bicarbonate: 22.6 mEq/L (ref 20.0–24.0)
Calcium, Ion: 1.1 mmol/L — ABNORMAL LOW (ref 1.13–1.30)
Calcium, Ion: 1.12 mmol/L — ABNORMAL LOW (ref 1.13–1.30)
Calcium, Ion: 1.03 mmol/L — ABNORMAL LOW (ref 1.13–1.30)
HCT: 31 % — ABNORMAL LOW (ref 39.0–52.0)
HCT: 24 % — ABNORMAL LOW (ref 39.0–52.0)
HCT: 23 % — ABNORMAL LOW (ref 39.0–52.0)
HCT: 24 % — ABNORMAL LOW (ref 39.0–52.0)
Hemoglobin: 10.5 g/dL — ABNORMAL LOW (ref 13.0–17.0)
Hemoglobin: 8.2 g/dL — ABNORMAL LOW (ref 13.0–17.0)
O2 Saturation: 100 %
O2 Saturation: 100 %
Patient temperature: 35
Patient temperature: 34.3
Potassium: 3.7 mEq/L (ref 3.5–5.1)
Potassium: 4.7 mEq/L (ref 3.5–5.1)
Potassium: 5 mEq/L (ref 3.5–5.1)
Sodium: 135 mEq/L (ref 135–145)
Sodium: 131 mEq/L — ABNORMAL LOW (ref 135–145)
Sodium: 134 mEq/L — ABNORMAL LOW (ref 135–145)
Sodium: 135 mEq/L (ref 135–145)
TCO2: 30 mmol/L (ref 0–100)
TCO2: 24 mmol/L (ref 0–100)
pCO2 arterial: 41.5 mmHg (ref 35.0–45.0)
pCO2 arterial: 43.5 mmHg (ref 35.0–45.0)
pH, Arterial: 7.455 — ABNORMAL HIGH (ref 7.350–7.450)
pH, Arterial: 7.31 — ABNORMAL LOW (ref 7.350–7.450)
pO2, Arterial: 416 mmHg — ABNORMAL HIGH (ref 80.0–100.0)
pO2, Arterial: 366 mmHg — ABNORMAL HIGH (ref 80.0–100.0)
pO2, Arterial: 409 mmHg — ABNORMAL HIGH (ref 80.0–100.0)

## 2012-01-21 LAB — POCT I-STAT 3, ART BLOOD GAS (G3+)
Acid-base deficit: 3 mmol/L — ABNORMAL HIGH (ref 0.0–2.0)
O2 Saturation: 100 %
Patient temperature: 94
pO2, Arterial: 186 mmHg — ABNORMAL HIGH (ref 80.0–100.0)

## 2012-01-21 LAB — CBC WITH DIFFERENTIAL/PLATELET
Basophils Relative: 0 % (ref 0–1)
Eosinophils Absolute: 0 10*3/uL (ref 0.0–0.7)
Hemoglobin: 11.1 g/dL — ABNORMAL LOW (ref 13.0–17.0)
MCH: 29.3 pg (ref 26.0–34.0)
MCHC: 34.5 g/dL (ref 30.0–36.0)
Monocytes Relative: 13 % — ABNORMAL HIGH (ref 3–12)
Neutrophils Relative %: 81 % — ABNORMAL HIGH (ref 43–77)
RDW: 15.3 % (ref 11.5–15.5)

## 2012-01-21 LAB — CBC
HCT: 28.2 % — ABNORMAL LOW (ref 39.0–52.0)
Hemoglobin: 9.7 g/dL — ABNORMAL LOW (ref 13.0–17.0)
MCH: 29.9 pg (ref 26.0–34.0)
MCV: 87 fL (ref 78.0–100.0)
Platelets: 149 10*3/uL — ABNORMAL LOW (ref 150–400)
RBC: 3.24 MIL/uL — ABNORMAL LOW (ref 4.22–5.81)
WBC: 11.9 10*3/uL — ABNORMAL HIGH (ref 4.0–10.5)

## 2012-01-21 LAB — APTT: aPTT: 41 seconds — ABNORMAL HIGH (ref 24–37)

## 2012-01-21 LAB — BASIC METABOLIC PANEL
BUN: 10 mg/dL (ref 6–23)
CO2: 22 mEq/L (ref 19–32)
Calcium: 9.9 mg/dL (ref 8.4–10.5)
Chloride: 103 mEq/L (ref 96–112)
Creatinine, Ser: 0.87 mg/dL (ref 0.50–1.35)
Glucose, Bld: 198 mg/dL — ABNORMAL HIGH (ref 70–99)

## 2012-01-21 LAB — CARDIAC PANEL(CRET KIN+CKTOT+MB+TROPI)
CK, MB: 2.1 ng/mL (ref 0.3–4.0)
Total CK: 55 U/L (ref 7–232)
Troponin I: <0.3 ng/mL (ref <0.30)
Troponin I: <0.3 ng/mL (ref <0.30)

## 2012-01-21 LAB — GLUCOSE, CAPILLARY

## 2012-01-21 LAB — MAGNESIUM: Magnesium: 1.6 mg/dL (ref 1.5–2.5)

## 2012-01-21 SURGERY — REMOVAL, ENDOVASCULAR GRAFT, AORTA, ABDOMINAL
Anesthesia: General | Site: Abdomen | Wound class: Dirty or Infected

## 2012-01-21 MED ORDER — MIDAZOLAM HCL 2 MG/2ML IJ SOLN
2.0000 mg | INTRAMUSCULAR | Status: DC | PRN
  Administered 2012-01-21 (×2): 4 mg via INTRAVENOUS
  Filled 2012-01-21 (×2): qty 4

## 2012-01-21 MED ORDER — PROTAMINE SULFATE 10 MG/ML IV SOLN
INTRAVENOUS | Status: DC | PRN
  Administered 2012-01-21 (×10): 10 mg via INTRAVENOUS

## 2012-01-21 MED ORDER — SODIUM CHLORIDE 0.9 % IV SOLN
500.0000 mL | Freq: Once | INTRAVENOUS | Status: AC | PRN
  Administered 2012-01-21: 20 mL via INTRAVENOUS

## 2012-01-21 MED ORDER — MAGNESIUM SULFATE 40 MG/ML IJ SOLN: 2.0000 g | Freq: Once | INTRAMUSCULAR | Status: DC | PRN

## 2012-01-21 MED ORDER — CALCIUM CHLORIDE 10 % IV SOLN
INTRAVENOUS | Status: DC | PRN
  Administered 2012-01-21 (×5): .25 g via INTRAVENOUS
  Administered 2012-01-21: 0.5 g via INTRAVENOUS
  Administered 2012-01-21 (×5): .25 g via INTRAVENOUS

## 2012-01-21 MED ORDER — MAGNESIUM SULFATE 40 MG/ML IJ SOLN
2.0000 g | Freq: Once | INTRAMUSCULAR | Status: AC
  Administered 2012-01-21: 2 g via INTRAVENOUS

## 2012-01-21 MED ORDER — LABETALOL HCL 5 MG/ML IV SOLN: 10.0000 mg | INTRAVENOUS | Status: DC | PRN

## 2012-01-21 MED ORDER — 0.9 % SODIUM CHLORIDE (POUR BTL) OPTIME
TOPICAL | Status: DC | PRN
  Administered 2012-01-21: 1000 mL
  Administered 2012-01-21: 5000 mL
  Administered 2012-01-21 (×2): 1000 mL

## 2012-01-21 MED ORDER — DEXTROSE 5 % IV SOLN
20.0000 mg | INTRAVENOUS | Status: DC | PRN
  Administered 2012-01-21: 25 ug/min via INTRAVENOUS
  Administered 2012-01-21: 15:00:00 via INTRAVENOUS

## 2012-01-21 MED ORDER — ACETAMINOPHEN 650 MG RE SUPP: 325.0000 mg | RECTAL | Status: DC | PRN

## 2012-01-21 MED ORDER — INSULIN ASPART 100 UNIT/ML ~~LOC~~ SOLN
0.0000 [IU] | SUBCUTANEOUS | Status: DC
  Administered 2012-01-21: 4 [IU] via SUBCUTANEOUS
  Administered 2012-01-21 – 2012-01-22 (×7): 3 [IU] via SUBCUTANEOUS

## 2012-01-21 MED ORDER — METOPROLOL TARTRATE 1 MG/ML IV SOLN
5.0000 mg | Freq: Four times a day (QID) | INTRAVENOUS | Status: DC
  Administered 2012-01-22 – 2012-01-26 (×15): 5 mg via INTRAVENOUS
  Filled 2012-01-21 (×25): qty 5

## 2012-01-21 MED ORDER — PHENYLEPHRINE HCL 10 MG/ML IJ SOLN
INTRAMUSCULAR | Status: DC | PRN
  Administered 2012-01-21 (×2): 40 ug via INTRAVENOUS
  Administered 2012-01-21: 80 ug via INTRAVENOUS
  Administered 2012-01-21 (×2): 40 ug via INTRAVENOUS

## 2012-01-21 MED ORDER — LIDOCAINE HCL (CARDIAC) 20 MG/ML IV SOLN
INTRAVENOUS | Status: DC | PRN
  Administered 2012-01-21: 80 mg via INTRAVENOUS

## 2012-01-21 MED ORDER — SODIUM CHLORIDE 0.9 % IV SOLN
600.0000 mg | INTRAVENOUS | Status: DC
  Administered 2012-01-21 – 2012-01-24 (×3): 600 mg via INTRAVENOUS
  Filled 2012-01-21 (×5): qty 600

## 2012-01-21 MED ORDER — GUAIFENESIN-DM 100-10 MG/5ML PO SYRP: 15.0000 mL | ORAL_SOLUTION | ORAL | Status: DC | PRN

## 2012-01-21 MED ORDER — LACTATED RINGERS IV SOLN
INTRAVENOUS | Status: DC | PRN
  Administered 2012-01-21 (×6): via INTRAVENOUS

## 2012-01-21 MED ORDER — PANTOPRAZOLE SODIUM 40 MG IV SOLR
40.0000 mg | Freq: Every day | INTRAVENOUS | Status: DC
  Administered 2012-01-21 – 2012-01-23 (×3): 40 mg via INTRAVENOUS
  Filled 2012-01-21 (×5): qty 40

## 2012-01-21 MED ORDER — MAGNESIUM SULFATE 40 MG/ML IJ SOLN
2.0000 g | Freq: Once | INTRAMUSCULAR | Status: AC | PRN
  Filled 2012-01-21 (×2): qty 100

## 2012-01-21 MED ORDER — FENTANYL CITRATE 0.05 MG/ML IJ SOLN
25.0000 ug | INTRAMUSCULAR | Status: DC | PRN
  Administered 2012-01-22 (×3): 50 ug via INTRAVENOUS
  Filled 2012-01-21 (×2): qty 2

## 2012-01-21 MED ORDER — KCL IN DEXTROSE-NACL 20-5-0.9 MEQ/L-%-% IV SOLN
INTRAVENOUS | Status: DC
  Filled 2012-01-21 (×2): qty 1000

## 2012-01-21 MED ORDER — DEXMEDETOMIDINE HCL IN NACL 200 MCG/50ML IV SOLN: 0.4000 ug/kg/h | INTRAVENOUS | Status: DC

## 2012-01-21 MED ORDER — PROPOFOL 10 MG/ML IV EMUL
INTRAVENOUS | Status: DC | PRN
  Administered 2012-01-21: 300 mg via INTRAVENOUS

## 2012-01-21 MED ORDER — DEXMEDETOMIDINE HCL IN NACL 200 MCG/50ML IV SOLN
0.2000 ug/kg/h | INTRAVENOUS | Status: DC
  Administered 2012-01-22: 0.7 ug/kg/h via INTRAVENOUS
  Administered 2012-01-22: 0.5 ug/kg/h via INTRAVENOUS
  Filled 2012-01-21 (×3): qty 50

## 2012-01-21 MED ORDER — DOCUSATE SODIUM 100 MG PO CAPS
100.0000 mg | ORAL_CAPSULE | Freq: Every day | ORAL | Status: DC
  Administered 2012-01-24: 100 mg via ORAL
  Filled 2012-01-21 (×5): qty 1

## 2012-01-21 MED ORDER — SODIUM CHLORIDE 0.9 % IV SOLN
INTRAVENOUS | Status: DC
  Administered 2012-01-21: 17:00:00 via INTRAVENOUS
  Administered 2012-01-23: 75 mL/h via INTRAVENOUS
  Administered 2012-01-24 – 2012-01-26 (×3): via INTRAVENOUS

## 2012-01-21 MED ORDER — ALBUTEROL SULFATE (5 MG/ML) 0.5% IN NEBU: 2.5000 mg | INHALATION_SOLUTION | RESPIRATORY_TRACT | Status: DC | PRN

## 2012-01-21 MED ORDER — MIDAZOLAM HCL 2 MG/2ML IJ SOLN: 2.0000 mg | INTRAMUSCULAR | Status: DC | PRN

## 2012-01-21 MED ORDER — SODIUM CHLORIDE 0.9 % IV SOLN
200.0000 ug | INTRAVENOUS | Status: DC | PRN
  Administered 2012-01-21: .5 ug/kg/h via INTRAVENOUS

## 2012-01-21 MED ORDER — ALUM & MAG HYDROXIDE-SIMETH 200-200-20 MG/5ML PO SUSP: 15.0000 mL | ORAL | Status: DC | PRN

## 2012-01-21 MED ORDER — VECURONIUM BROMIDE 10 MG IV SOLR
INTRAVENOUS | Status: DC | PRN
  Administered 2012-01-21 (×3): 2 mg via INTRAVENOUS
  Administered 2012-01-21: 4 mg via INTRAVENOUS
  Administered 2012-01-21: 2 mg via INTRAVENOUS
  Administered 2012-01-21: 5 mg via INTRAVENOUS
  Administered 2012-01-21: 2 mg via INTRAVENOUS
  Administered 2012-01-21: 5 mg via INTRAVENOUS
  Administered 2012-01-21 (×2): 2 mg via INTRAVENOUS

## 2012-01-21 MED ORDER — DIPHENHYDRAMINE HCL 50 MG/ML IJ SOLN: 12.5000 mg | Freq: Four times a day (QID) | INTRAMUSCULAR | Status: DC | PRN

## 2012-01-21 MED ORDER — THROMBIN 20000 UNITS EX SOLR
CUTANEOUS | Status: DC | PRN
  Administered 2012-01-21: 14:00:00 via TOPICAL

## 2012-01-21 MED ORDER — HYDRALAZINE HCL 20 MG/ML IJ SOLN: 10.0000 mg | INTRAMUSCULAR | Status: DC | PRN

## 2012-01-21 MED ORDER — SODIUM CHLORIDE 0.9 % IJ SOLN: 9.0000 mL | INTRAMUSCULAR | Status: DC | PRN

## 2012-01-21 MED ORDER — METOPROLOL TARTRATE 1 MG/ML IV SOLN
2.0000 mg | INTRAVENOUS | Status: DC | PRN
  Administered 2012-01-22: 3 mg via INTRAVENOUS

## 2012-01-21 MED ORDER — MANNITOL 25 % IV SOLN
INTRAVENOUS | Status: DC | PRN
  Administered 2012-01-21: 25 g via INTRAVENOUS

## 2012-01-21 MED ORDER — ONDANSETRON HCL 4 MG/2ML IJ SOLN: 4.0000 mg | Freq: Four times a day (QID) | INTRAMUSCULAR | Status: DC | PRN

## 2012-01-21 MED ORDER — DEXTROSE 5 % IV SOLN
1.5000 g | Freq: Two times a day (BID) | INTRAVENOUS | Status: DC
  Filled 2012-01-21 (×2): qty 1.5

## 2012-01-21 MED ORDER — DEXMEDETOMIDINE HCL IN NACL 400 MCG/100ML IV SOLN
0.4000 ug/kg/h | INTRAVENOUS | Status: DC
  Filled 2012-01-21: qty 100

## 2012-01-21 MED ORDER — HYDRALAZINE HCL 20 MG/ML IJ SOLN
10.0000 mg | INTRAMUSCULAR | Status: DC | PRN
  Filled 2012-01-21: qty 0.5

## 2012-01-21 MED ORDER — DIPHENHYDRAMINE HCL 12.5 MG/5ML PO ELIX
12.5000 mg | ORAL_SOLUTION | Freq: Four times a day (QID) | ORAL | Status: DC | PRN
  Filled 2012-01-21: qty 5

## 2012-01-21 MED ORDER — HYDROMORPHONE HCL PF 1 MG/ML IJ SOLN: 0.2500 mg | INTRAMUSCULAR | Status: DC | PRN

## 2012-01-21 MED ORDER — PHENOL 1.4 % MT LIQD: 1.0000 | OROMUCOSAL | Status: DC | PRN

## 2012-01-21 MED ORDER — HETASTARCH-ELECTROLYTES 6 % IV SOLN
INTRAVENOUS | Status: DC | PRN
  Administered 2012-01-21: 12:00:00 via INTRAVENOUS

## 2012-01-21 MED ORDER — SODIUM CHLORIDE 0.9 % IR SOLN
Status: DC | PRN
  Administered 2012-01-21: 10:00:00

## 2012-01-21 MED ORDER — CHLORHEXIDINE GLUCONATE 0.12 % MT SOLN
15.0000 mL | Freq: Two times a day (BID) | OROMUCOSAL | Status: DC
  Administered 2012-01-21 – 2012-01-23 (×4): 15 mL via OROMUCOSAL
  Filled 2012-01-21 (×4): qty 15

## 2012-01-21 MED ORDER — POTASSIUM CHLORIDE CRYS ER 20 MEQ PO TBCR: 20.0000 meq | EXTENDED_RELEASE_TABLET | Freq: Once | ORAL | Status: DC | PRN

## 2012-01-21 MED ORDER — POTASSIUM CHLORIDE CRYS ER 20 MEQ PO TBCR: 20.0000 meq | EXTENDED_RELEASE_TABLET | Freq: Every day | ORAL | Status: DC | PRN

## 2012-01-21 MED ORDER — METOPROLOL TARTRATE 1 MG/ML IV SOLN: 2.0000 mg | INTRAVENOUS | Status: DC | PRN

## 2012-01-21 MED ORDER — PANTOPRAZOLE SODIUM 40 MG PO TBEC: 40.0000 mg | DELAYED_RELEASE_TABLET | Freq: Every day | ORAL | Status: DC

## 2012-01-21 MED ORDER — ACETAMINOPHEN 325 MG PO TABS: 325.0000 mg | ORAL_TABLET | ORAL | Status: DC | PRN

## 2012-01-21 MED ORDER — SODIUM CHLORIDE 0.9 % IV SOLN
INTRAVENOUS | Status: DC | PRN
  Administered 2012-01-21 (×6): via INTRAVENOUS

## 2012-01-21 MED ORDER — MIDAZOLAM HCL 5 MG/5ML IJ SOLN
INTRAMUSCULAR | Status: DC | PRN
  Administered 2012-01-21: 2 mg via INTRAVENOUS

## 2012-01-21 MED ORDER — FENTANYL CITRATE 0.05 MG/ML IJ SOLN
50.0000 ug | INTRAMUSCULAR | Status: DC | PRN
  Administered 2012-01-21 (×2): 100 ug via INTRAVENOUS
  Filled 2012-01-21 (×2): qty 2

## 2012-01-21 MED ORDER — FENTANYL CITRATE 0.05 MG/ML IJ SOLN
INTRAMUSCULAR | Status: DC | PRN
  Administered 2012-01-21: 100 ug via INTRAVENOUS
  Administered 2012-01-21 (×5): 50 ug via INTRAVENOUS
  Administered 2012-01-21: 400 ug via INTRAVENOUS
  Administered 2012-01-21: 100 ug via INTRAVENOUS
  Administered 2012-01-21: 50 ug via INTRAVENOUS

## 2012-01-21 MED ORDER — HEPARIN SODIUM (PORCINE) 1000 UNIT/ML IJ SOLN
INTRAMUSCULAR | Status: DC | PRN
  Administered 2012-01-21: 5000 [IU] via INTRAVENOUS
  Administered 2012-01-21: 10000 [IU] via INTRAVENOUS

## 2012-01-21 MED ORDER — BIOTENE DRY MOUTH MT LIQD
15.0000 mL | Freq: Four times a day (QID) | OROMUCOSAL | Status: DC
  Administered 2012-01-21 – 2012-01-23 (×9): 15 mL via OROMUCOSAL

## 2012-01-21 MED ORDER — PHENYLEPHRINE HCL 10 MG/ML IJ SOLN
30.0000 ug/min | INTRAVENOUS | Status: DC
  Administered 2012-01-21: 100 ug/min via INTRAVENOUS
  Filled 2012-01-21 (×5): qty 2

## 2012-01-21 MED ORDER — LIDOCAINE HCL 4 % MT SOLN
OROMUCOSAL | Status: DC | PRN
  Administered 2012-01-21: 4 mL via TOPICAL

## 2012-01-21 MED ORDER — ROCURONIUM BROMIDE 100 MG/10ML IV SOLN
INTRAVENOUS | Status: DC | PRN
  Administered 2012-01-21: 50 mg via INTRAVENOUS

## 2012-01-21 MED ORDER — LABETALOL HCL 5 MG/ML IV SOLN
10.0000 mg | INTRAVENOUS | Status: DC | PRN
  Filled 2012-01-21: qty 4

## 2012-01-21 MED ORDER — SODIUM CHLORIDE 0.9 % IV SOLN: 500.0000 mL | Freq: Once | INTRAVENOUS | Status: DC | PRN

## 2012-01-21 MED ORDER — ONDANSETRON HCL 4 MG/2ML IJ SOLN: 4.0000 mg | Freq: Once | INTRAMUSCULAR | Status: DC | PRN

## 2012-01-21 MED ORDER — THROMBIN 20000 UNITS EX SOLR
CUTANEOUS | Status: AC
  Filled 2012-01-21: qty 20000

## 2012-01-21 MED ORDER — EPHEDRINE SULFATE 50 MG/ML IJ SOLN
INTRAMUSCULAR | Status: DC | PRN
  Administered 2012-01-21: 10 mg via INTRAVENOUS
  Administered 2012-01-21: 5 mg via INTRAVENOUS

## 2012-01-21 MED ORDER — HYDROMORPHONE 0.3 MG/ML IV SOLN: INTRAVENOUS | Status: DC

## 2012-01-21 MED ORDER — ALBUMIN HUMAN 5 % IV SOLN
INTRAVENOUS | Status: DC | PRN
  Administered 2012-01-21 (×3): via INTRAVENOUS

## 2012-01-21 MED ORDER — DOPAMINE-DEXTROSE 3.2-5 MG/ML-% IV SOLN: 3.0000 ug/kg/min | INTRAVENOUS | Status: DC | PRN

## 2012-01-21 MED ORDER — NALOXONE HCL 0.4 MG/ML IJ SOLN: 0.4000 mg | INTRAMUSCULAR | Status: DC | PRN

## 2012-01-21 MED ORDER — MAGNESIUM SULFATE 40 MG/ML IJ SOLN: 2.0000 g | Freq: Every day | INTRAMUSCULAR | Status: DC | PRN

## 2012-01-21 SURGICAL SUPPLY — 59 items
ATTRACTOMAT 16X20 MAGNETIC DRP (DRAPES) ×2 IMPLANT
BAG DECANTER FOR FLEXI CONT (MISCELLANEOUS) ×1 IMPLANT
BAG ISL DRAPE 18X18 STRL (DRAPES) ×2
BAG ISOLATION DRAPE 18X18 (DRAPES) IMPLANT
BLANKET WARM CARDIAC ADLT BAIR (MISCELLANEOUS) ×1 IMPLANT
CANISTER SUCTION 2500CC (MISCELLANEOUS) ×6 IMPLANT
CANNULA VESSEL W/WING WO/VALVE (CANNULA) ×2 IMPLANT
CLIP TI MEDIUM 24 (CLIP) ×2 IMPLANT
CLIP TI WIDE RED SMALL 24 (CLIP) ×2 IMPLANT
CLOTH BEACON ORANGE TIMEOUT ST (SAFETY) ×2 IMPLANT
CONT SPEC 4OZ CLIKSEAL STRL BL (MISCELLANEOUS) ×3 IMPLANT
COVER SURGICAL LIGHT HANDLE (MISCELLANEOUS) ×2 IMPLANT
DRAPE ISOLATION BAG 18X18 (DRAPES) ×2
DRAPE WARM FLUID 44X44 (DRAPE) ×2 IMPLANT
DRSG COVADERM 4X14 (GAUZE/BANDAGES/DRESSINGS) ×1 IMPLANT
ELECT BLADE 6.5 EXT (BLADE) IMPLANT
ELECT REM PT RETURN 9FT ADLT (ELECTROSURGICAL) ×2
ELECTRODE REM PT RTRN 9FT ADLT (ELECTROSURGICAL) ×1 IMPLANT
GLOVE BIO SURGEON STRL SZ7.5 (GLOVE) ×4 IMPLANT
GLOVE BIOGEL PI IND STRL 6.5 (GLOVE) IMPLANT
GLOVE BIOGEL PI INDICATOR 6.5 (GLOVE) ×6
GLOVE ECLIPSE 7.5 STRL STRAW (GLOVE) ×1 IMPLANT
GLOVE ORTHOPEDIC STR SZ6.5 (GLOVE) ×1 IMPLANT
GOWN PREVENTION PLUS XLARGE (GOWN DISPOSABLE) ×6 IMPLANT
GOWN STRL NON-REIN LRG LVL3 (GOWN DISPOSABLE) ×7 IMPLANT
INSERT FOGARTY 61MM (MISCELLANEOUS) ×2 IMPLANT
INSERT FOGARTY SM (MISCELLANEOUS) ×4 IMPLANT
KIT BASIN OR (CUSTOM PROCEDURE TRAY) ×2 IMPLANT
KIT ROOM TURNOVER OR (KITS) ×2 IMPLANT
LACTATED RINGERS WITH 5% DEXTROSE INJECTION (1000ML) ×1 IMPLANT
LOOP VESSEL MINI RED (MISCELLANEOUS) IMPLANT
NS IRRIG 1000ML POUR BTL (IV SOLUTION) ×12 IMPLANT
PACK AORTA (CUSTOM PROCEDURE TRAY) ×2 IMPLANT
PAD ARMBOARD 7.5X6 YLW CONV (MISCELLANEOUS) ×4 IMPLANT
RETAINER VISCERA MED (MISCELLANEOUS) ×2 IMPLANT
SLEEVE SURGEON STRL (DRAPES) ×1 IMPLANT
SPECIMEN JAR MEDIUM (MISCELLANEOUS) ×2 IMPLANT
SPONGE LAP 18X18 X RAY DECT (DISPOSABLE) ×5 IMPLANT
SPONGE SURGIFOAM ABS GEL 100 (HEMOSTASIS) ×1 IMPLANT
STAPLER VISISTAT 35W (STAPLE) ×5 IMPLANT
SUT PDS AB 1 TP1 54 (SUTURE) ×4 IMPLANT
SUT PROLENE 3 0 SH DA (SUTURE) ×4 IMPLANT
SUT PROLENE 3 0 SH1 36 (SUTURE) ×10 IMPLANT
SUT PROLENE 5 0 C 1 36 (SUTURE) ×5 IMPLANT
SUT PROLENE 5 0 CC 1 (SUTURE) ×4 IMPLANT
SUT PROLENE 6 0 C 1 30 (SUTURE) ×3 IMPLANT
SUT SILK 2 0SH CR/8 30 (SUTURE) ×2 IMPLANT
SUT VIC AB 2-0 CT1 36 (SUTURE) ×2 IMPLANT
SUT VIC AB 3-0 SH 27 (SUTURE) ×2
SUT VIC AB 3-0 SH 27X BRD (SUTURE) IMPLANT
SWAB COLLECTION DEVICE MRSA (MISCELLANEOUS) ×4 IMPLANT
SYR 20CC LL (SYRINGE) ×1 IMPLANT
TOWEL BLUE STERILE X RAY DET (MISCELLANEOUS) ×2 IMPLANT
TOWEL OR 17X24 6PK STRL BLUE (TOWEL DISPOSABLE) ×6 IMPLANT
TOWEL OR 17X26 10 PK STRL BLUE (TOWEL DISPOSABLE) ×4 IMPLANT
TRAY FOLEY CATH 14FRSI W/METER (CATHETERS) ×2 IMPLANT
TUBE ANAEROBIC SPECIMEN COL (MISCELLANEOUS) ×4 IMPLANT
WATER STERILE IRR 1000ML POUR (IV SOLUTION) ×4 IMPLANT
descending thoracic aorta 14mm x10.5 cm  DA=12 mm (Tissue) ×1 IMPLANT

## 2012-01-21 NOTE — Transfer of Care (Signed)
Immediate Anesthesia Transfer of Care Note  Patient: Alfred [name]  Procedure(s) Performed: Procedure(s) (LRB): REMOVAL OF ABDOMINAL AORTIC STENT GRAFT (N/A) ANEURYSM ABDOMINAL AORTIC REPAIR (N/A)  Patient Location: ICU 2313  Anesthesia Type: General  Level of Consciousness: sedated  Airway & Oxygen Therapy: Patient remains intubated per anesthesia plan and Patient placed on Ventilator (see vital sign flow sheet for setting)  Post-op Assessment: Post -op Vital signs reviewed and stable, report to ICU RN  Post vital signs: Reviewed and stable  Complications: No apparent anesthesia complications

## 2012-01-21 NOTE — Consult Note (Signed)
Name: Alfred [name] MRN: [id number] DOB: 08/24/1950    LOS: 3  Referring Provider:  Fabienne Bruns Reason for Referral:  Respiratory failure  PULMONARY / CRITICAL CARE MEDICINE  HPI:  61 y/o M with PMH of HTN, CAD s/p DES to RCA & CFX, AAA s/p repair through bilateral groin cutdown 09/06/11. Had continued left flank pain radiating to groin. 7/26 underwent CT ABD/Pelvis which demonstrated periaortic infection and 3 x 5.2 cm fluid collection at the left posterolateral aspect of the aortic endo graft. Followed by ID and treated for presumed endovascular graft infection (vancomycin, rifampin, ceftriaxone and metronidazole). Repeat CT on 8/5 showed progression of periaortic infection with increase in size 6.1 x 5.4 cm abscess. 8/6 underwent CT guided aspiration of 4ml of turbid fluid. Admitted 8/20 for continued fevers, planned graft removal. 8/23 underwent removal of infected Gore Excluder stent graft, aorto biiliac graft with cadaveric cryopreserved aorta. PCCM consulted for post op ICU / Vent mgmt. In OR received approximately 8 units PRBC's, 1 pk platlets, 2 FFP.    Past Medical History  Diagnosis Date  . Dyslipidemia   . Left lumbar radiculopathy   . Hyperplasia of prostate without lower urinary tract symptoms (LUTS)   . Other and unspecified hyperlipidemia   . Hx of adenomatous colonic polyps 2008  . HTN (hypertension)     unspecified essential  . CAD (coronary artery disease)     DES to RCA and CFX; Dr. Excell Seltzer cardiologist  . AAA (abdominal aortic aneurysm) 08/2011  . Anginal pain   . Arthritis     "knees; not bad"  . Depression ~ 2009    'treated for ~ 6 months"   Past Surgical History  Procedure Date  . Colonoscopy w/ polypectomy 2008    adenomatous  & hyperplastic polyp;due 2013  . Cystoscopy 2005    varicose veins in bladder, Dr Retta Diones  . Artery repair 09/06/2011    Procedure: ARTERY REPAIR;  Surgeon: Sherren Kerns, MD;  Location: [facility] OR;  Service: Vascular;  Laterality:  Bilateral;  . Appendectomy 1970  . Coronary angioplasty with stent placement 2002; 2005    "2 +1; total of 3"  . Tonsillectomy and adenoidectomy 1954  . Vasectomy   . Abdominal aortic aneurysm repair 09/06/2011   Prior to Admission medications   Medication Sig Start Date End Date Taking? Authorizing Provider  acetaminophen (TYLENOL) 500 MG tablet Take 1,000 mg by mouth every 6 (six) hours as needed. For pain/fever   Yes Historical Provider, MD  aspirin 81 MG tablet Take 81 mg by mouth at bedtime.    Yes Historical Provider, MD  atorvastatin (LIPITOR) 10 MG tablet Take 10 mg by mouth daily.   Yes Historical Provider, MD  clopidogrel (PLAVIX) 75 MG tablet Take 75 mg by mouth every morning.    Yes Historical Provider, MD  fentaNYL (DURAGESIC - DOSED MCG/HR) 50 MCG/HR Place 1 patch onto the skin every 3 (three) days.   Yes Historical Provider, MD  Heparin Sodium, Porcine, (SASH KIT IJ) Inject as directed.   Yes Historical Provider, MD  HYDROmorphone (DILAUDID) 2 MG tablet Take 2 mg by mouth every 6 (six) hours as needed. For pain   Yes Historical Provider, MD  losartan-hydrochlorothiazide (HYZAAR) 100-25 MG per tablet Take 1 tablet by mouth at bedtime.    Yes Historical Provider, MD  metoprolol succinate (TOPROL-XL) 100 MG 24 hr tablet Take 100 mg by mouth at bedtime. Take with or immediately following a meal.   Yes Historical  Provider, MD  Multiple Vitamin (MULTIVITAMIN WITH MINERALS) TABS Take 1 tablet by mouth every morning.   Yes Historical Provider, MD  potassium chloride SA (K-DUR,KLOR-CON) 20 MEQ tablet Take 20 mEq by mouth 2 (two) times daily.   Yes Historical Provider, MD  rifampin (RIFADIN) 300 MG capsule Take 300 mg by mouth 2 (two) times daily.   Yes Historical Provider, MD  sodium chloride 0.9 % SOLN 100 mL with meropenem 1 G SOLR 2 g Inject 2 g into the vein every 8 (eight) hours. 01/10/12  Yes Amelia Jo Roczniak, PA  vancomycin (VANCOCIN) 1 GM/200ML SOLN Inject 1,000 mg into the vein  every 8 (eight) hours.   Yes Historical Provider, MD  atorvastatin (LIPITOR) 10 MG tablet Take 10 mg by mouth at bedtime.  01/11/11 01/11/12  Tonny Bollman, MD   Allergies No Known Allergies  Family History Family History  Problem Relation Age of Onset  . Coronary artery disease Father     CABG in 65s  . Diabetes Father   . Colon polyps Father   . Heart attack Father     ?  Marland Kitchen Cancer Father     non-small cell carcinoma/bone CA  . Hypertension Father   . Heart disease Father   . Coronary artery disease Maternal Grandfather   . Coronary artery disease Paternal Grandfather   . Other Mother     varicose veins  . Anesthesia problems Neg Hx    Social History  reports that he quit smoking about 18 years ago. His smoking use included Cigarettes. He has a 27 pack-year smoking history. His smokeless tobacco use includes Snuff. He reports that he drinks about 12.6 ounces of alcohol per week. He reports that he does not use illicit drugs.  Review Of Systems:  Unable to obtain as pt intubated, sedated.  Brief patient description:  61 yo male s/p removal of infected AAA graft.  Required large volume transfusion intra-op.  Remained on vent, pressors post-op 8/23 and PCCM consulted.  Events Since Admission: 8/23 OR  Current Status: Critically ill.  On pressors.  On vent.  Getting transfusion.  Vital Signs: Temp:  [98.8 F (37.1 C)-99.3 F (37.4 C)] 99.3 F (37.4 C) (08/23 0352) Pulse Rate:  [92-101] 101  (08/23 1600) Resp:  [16-18] 16  (08/23 1600) BP: (96-109)/(62-66) 96/66 mmHg (08/23 1600) SpO2:  [94 %-96 %] 96 % (08/23 1600) FiO2 (%):  [50 %] 50 % (08/23 1600)  Physical Examination: General:  Ill appearing Neuro:  Sedated HEENT:  ETT, Rt IJ in place.  Pupils reactive. Neck:  supple Cardiovascular:  s1s2 regular, no murmur Lungs:  Decreased bs, scattered rhonchi, no wheeze Abdomen:  Wound dressing clean, no bowel sounds Musculoskeletal:  No edema, extremities cool Skin:   Raised, skin colored rash on upper and lower extremities with sparing of trunk and face  Nm Wbc Scan Tumor  01/20/2012  *RADIOLOGY REPORT*  Clinical Data: Infected aortic graft.  NUCLEAR MEDICINE LOCALIZATION OF TUMOR LIMITED  Technique:  After intravenous injection of radiopharmaceutical, standard planar projections were obtained on subsequent days through the multiple area of interest.  Radiopharmaceutical: CURIE DTPA INDIUM-111 PENTETATE, CURIE TC-CERETEC TECHNETIUM TC 35M EXAMETAZIME IV KIT  Comparison: CT scan 01/18/2012.  Findings: There is normal expected uptake in the liver and spleen. Mild bone marrow activity is also normal.  There is abnormal uptake in the left lower paraspinal region which correlates with the infected aortic graft and left psoas involvement.  No other areas  of abnormal uptake are identified.  IMPRESSION: Abnormal uptake of the labeled white blood cells in the region of the patient's infected aortic graft and left psoas abscess.   Original Report Authenticated By: P. Loralie Champagne, M.D.    Dg Chest Portable 1 View  01/21/2012  *RADIOLOGY REPORT*  Clinical Data: For central line placement  PORTABLE CHEST - 1 VIEW  Comparison: Portable chest x-ray of 09/06/2011  Findings: The tip of the endotracheal tube is approximately 2.9 cm above the carina.  A right upper extremity PICC line is present with the tip overlying the region of the lower SVC.  A right IJ sheath is again noted overlying the lower right internal jugular vein.  No pneumothorax is seen.  The lungs are clear.  Mild cardiomegaly is stable.  IMPRESSION:  1.  Right PICC line tip overlies the region of the lower SVC. 2.  Venous sheath overlies the region of the lower right internal jugular vein. 3.  Endotracheal tip 2.9 cm above carina.   Original Report Authenticated By: Juline Patch, M.D.      ASSESSMENT AND PLAN  PULMONARY  Lab 01/21/12 1529 01/21/12 1427 01/21/12 1157 01/21/12 1016 01/21/12 0835    PHART 7.310* 7.316* 7.455* 7.466* 7.479*  PCO2ART 43.5 44.3 39.8 41.5 37.8  PO2ART 409.0* 366.0* 415.0* 416.0* 459.0*  HCO3 22.6 23.4 28.5* 30.5* 28.5*  O2SAT 100.0 100.0 100.0 100.0 100.0   Ventilator Settings: Vent Mode:  [-] PRVC FiO2 (%):  [50 %] 50 % Set Rate:  [14 bmp] 14 bmp Vt Set:  [161 mL] 660 mL PEEP:  [5 cmH20] 5 cmH20 Plateau Pressure:  [17 cmH20] 17 cmH20  ETT:  8/23>>  A:  Acute respiratory failure after removal of infected AAA graft. P:   Full vent support until more stable Continue 8 cc/kg Vt F/u CXR and ABG PRN BD's Monitor for TRALI  CARDIOVASCULAR No results found for this basename: TROPONINI:5,LATICACIDVEN:5, O2SATVEN:5,PROBNP:5 in the last 168 hours  Lines:  Rt PICC 8/12>> Rt IJ cortise 8/23>> Lt IJ CVL 8/23>>  A: Hypovolemic, hemorrhagic, septic shock. P:  Continue IV fluids to keep CVP > 8 Wean pressors to keep SBP > 90, MAP > 65 F/u cardiac enzymes May need to check echo    RENAL  Lab 01/21/12 1529 01/21/12 1427 01/21/12 1314 01/21/12 1157 01/21/12 1016 01/18/12 1250  NA 135 134* 133* 131* 133* --  K 4.8 5.0 -- -- -- --  CL -- -- -- -- -- 95*  CO2 -- -- -- -- -- 30  BUN -- -- -- -- -- 8  CREATININE -- -- -- -- -- 0.65  CALCIUM -- -- -- -- -- 8.8  MG -- -- -- -- -- --  PHOS -- -- -- -- -- --   Intake/Output      08/22 0701 - 08/23 0700 08/23 0701 - 08/24 0700   P.O. 240    I.V. (mL/kg)  9500 (100.3)   Blood  3679   IV Piggyback  1250   Total Intake(mL/kg) 240 (2.5) 14429 (152.4)   Urine (mL/kg/hr) 500 (0.2) 320 (0.3)   Blood  5000   Total Output 500 5320   Net -260 +9109         Foley:  8/23>>  A:  Oliguria. P:   Monitor renal fx, urine outpt, electrolytes Maintain hemodynamics  GASTROINTESTINAL  Lab 01/18/12 1250  AST 15  ALT 11  ALKPHOS 68  BILITOT 0.4  PROT 6.6  ALBUMIN 2.5*  A:  Nutrition. P:   NPO for now  HEMATOLOGIC  Lab 01/21/12 1600 01/21/12 1529 01/21/12 1427 01/21/12 1420 01/21/12 1314  01/21/12 1157 01/18/12 1250  HGB 9.7* 8.2* 7.8* -- 7.1* 8.2* --  HCT 28.2* 24.0* 23.0* -- 21.0* 24.0* --  PLT 149* -- -- -- -- -- 227  INR 1.56* -- -- 1.78* -- -- 1.16  APTT 39* -- -- 41* -- -- --   A:  Acute blood loss anemia during surgery. P:  F/u CBC, coags Transfuse as needed for bleeding and to maintain Hb > 7  INFECTIOUS  Lab 01/21/12 1600 01/18/12 1250  WBC 11.9* 2.9*  PROCALCITON -- --   Cultures: 8/23 Abd abscess>>  Antibiotics: 8/20 Vancomycin >> 8/20 Meropenem>> 8/20 Rifampin>>  A:  Abdominal abscess with infected AAA graft. P:   Abx as above F/u wound cultures May need ID input to assist with abx management  ENDOCRINE No results found for this basename: GLUCAP:5 in the last 168 hours A:  Hyperglycemia. P:   SSI  NEUROLOGIC  A:  Sedation, pain control. P:   Sedation protocol while on vent  DERMATOLOGY  A: Rash ?from betadine after surgery. P: PRN benadryl Try to avoid steroids  BEST PRACTICE / DISPOSITION Level of Care:  ICU Primary Service:  VVS Consultants:  PCCM Code Status:  Full Diet:  NPO DVT Px:  SCD  GI Px:  Protonix Skin Integrity:  Rash on extremities 8/23 Social / Family:  No family at bedside  Canary Brim, NP-C [facility] Pgr: [phone] or [phone] 01/21/2012, 4:48 PM  Reviewed above, examined pt, and agree with assessement/plan.  Critical care time 85 minutes.  Coralyn Helling, MD 01/21/2012, 5:18 PM Pager: [phone] After 3 pm: [phone]

## 2012-01-21 NOTE — Op Note (Signed)
Procedure: Removal of infected Gore Excluder stent graft, aorto biiliac graft with cadaveric cryopreserved aorta  Preoperative diagnosis: Infected Gore Excluder stent graft  Postoperative diagnosis: Same  Anesthesia: Gen.  Assistant: Cari Caraway M.D.  Operative findings: #1 [employer] cryopreserved aorta #2 distal anastomosis of cryo-graft to left common iliac end to end  #3 right common iliac end to side of the aortic graft  #4 multiple cultures sent from aortic wall stent graft and abscess cavity #5 3-4 cm abscess cavity left periaortic psoas area  #6 360 omental wrap around the graft with tongue of omentum tunneled retrocolic  Operative details: After obtaining informed consent, the patient was taken to the operating room. The patient was placed in supine position on the operating room table. After induction of general anesthesia and endotracheal intubation the patient was prepped and draped in the usual sterile fashion from chin to the toes. Next a Foley catheter was placed an NG tube was placed. A midline laparotomy incision was made extending from the xiphoid down to the pubis. The incision was carried into the subcutaneous tissues and through the fascia and the peritoneum was entered. The greater omentum had some adhesions into the right lower quadrant and these were taken down with cautery. The omentum and transverse colon were then reflected superiorly. The duodenum and small bowel reflected to the right. And on the retractor was brought in the operative field for assistance and retraction. The retroperitoneum was entered. There was inflammation in this area as well as edema type fluid. There was also inflammation in the tissue surrounding the aortic wall. This was all taken down with cautery. Left renal vein was identified. This was dissected free circumferentially and then ligated and divided between clamps. Each in the renal vein was then oversewn with a running 5-0 Prolene suture. Clamps  were removed and this was found to be hemostatic. The left and right renal arteries were then identified. A vessel loop was placed around both of these. Dissection was then carried down to level the main portion of the aorta. The inferior mesenteric artery was identified and dissected free circumferentially. A vessel loop placed around this. Dissection was then carried down to level of the aortic bifurcation. There was dense inflammation around both iliac arteries. Dissection was carried down to the iliac bifurcation on both sides. The external and internal iliac arteries were dissected free circumferentially and vessel loops were placed around these. At this point a cryopreserved aortic graft was thawed under the company protocol on the back table. After the graft had gone through the thawing procedure it was inspected and several flush branch openings were sutured closed with interrupted 5-0 Prolene figure-of-eight sutures. The graft was pressurized and found to be hemostatic. At this point the patient was given 10,000 units of intravenous heparin. The patient was given an additional bolus of 5000 heparin during the course of the case. After appropriate circulation time the administration of 25 g of mannitol, the left and right internal and external iliac arteries were controlled distally with vessel loops. The IMA was controlled with a vessel loop. The aorta was then controlled just above the level of the renal arteries and the renal arteries were controlled with Vesseloops. A longitudinal opening was then made an aortic sac. There was some lumbar vessel bleeding and purulent drainage but this was fairly minimal overall. The left limb of the stent graft was removed a gentle traction. There was a large amount of brownish purulent-like material surrounding the graft. After removing  the left limb of the graft was good backbleeding from the iliac. The right limb of the graft was then removed in similar fashion. The  proximal portion the graft was then removed with gentle traction. There was some laminated thrombus up adjacent to the renal arteries and this was removed and direct vision. There was an abscess cavity that had penetrated from the aortic wall into the left. Psoas space. All the abscess material was thoroughly irrigated out as well as some thrombus. A portion of the aortic wall was also debrided away that was necrotic and character. At this point the proximal aortic neck was inspected and the aorta was of good quality a few centimeters below the renal arteries. The aortic graft was brought on the operative field and sewn end-to-end to the proximal aorta using running 3-0 Prolene suture. At completion anastomosis this was tested and found to be hemostatic after additional repair stitch was placed on the posterior wall. The new graft was then clamped just below the anastomosis with a straight Coarct clamp. The distal end of the aorta was then opened further longitudinally. The original plan was to do a tube graft to the distal aorta. However there was a very wide splaying in the right and left iliac arteries and the graft was not going to lay appropriately. Therefore the size match of the aorta and the left common iliac artery were fairly similar. The right common iliac artery was dissected free circumferentially and mobilized from over several centimeters up to the aortic bifurcation. It was then transected at this level. The aortic graft was then sewn end-to-end to the left common iliac artery using a running 3-0 Prolene suture. Just prior to completion of the anastomosis this was forebled backbled and thoroughly flushed and flow was briefly restored to the left lower extremity. Vessel loops were then again used to control the internal/external iliac artery on the left side. The aortic clamp was again placed at the midportion of the graft. A longitudinal opening was made in the side of the new aortic graft in the  right common iliac artery was sewn end artery to side of graft using a running 3-0 Prolene suture. Just prior to completion of the anastomosis it was forebled backbled and thoroughly flushed and the anastomosis was secured clamps released and there was a brief pressure drop with the left leg and a brief pressure drop with the right leg opening. Hemostasis was obtained with additional repair stitch along the inferior aspect of the right limb. Patient's feet were inspected at this point and found to have good dorsalis pedis posterior tibial Doppler signals. The patient was given 100 mg of protamine for heparin reversal. Thrombin Gelfoam was applied in the area near the renal arteries. There was inflammation and there was an additional lumbar artery bleeding and  the oozing was controlled fairly well after distraction of protamine. The lumbar arteries were controlled with figure-of-eight silk sutures. The inferior mesenteric artery was inspected and found to be thrombosed. This was ligated with 2 silk tie. I then proceeded to irrigate the area around the aortic graft as well as the abscess cavity with 4 L of normal saline solution. Several cultures had been taken from the abscess cavity the stent graft in the surrounding aortic tissue. Hemostasis at this point was adequate. The omentum was divided along its central portion and a long tongue of omentum was then tunneled retrocolic down the aortic graft. This was then wrapped circumferentially around the entire graft down  to the distal aorta. This was tacked in place with 2-0 Vicryl suture. The remainder of the retroperitoneum was then closed with a running 3-0 Vicryl suture. The Omni retractor was removed and the viscera were inspected and found to be healthy in appearance. The sigmoid colon was pink and viable the small bowel was also pink and viable. These were return to normal position. The remainder of the omentum was laid over top of the viscera. The fascia was  then reapproximated using a running #1 PDS suture. The skin incision was then irrigated with normal saline solution. The skin was then closed staples. The patient tolerated procedure well. The suprarenal clamp time was approximately 45 minutes. Patient was making 50 mL per hour of urine at the end of the case. Patient also Doppler signals in the feet in the case. Patient was taken intensive care unit in stable but critical condition.  Fabienne Bruns, MD [facility] Office: [phone] Pager: [phone]

## 2012-01-21 NOTE — Interval H&P Note (Signed)
History and Physical Interval Note:  01/21/2012 7:35 AM  Alfred [name]  has presented today for surgery, with the diagnosis of AAA;INFECTED STENT GRAFT  The various methods of treatment have been discussed with the patient and family. After consideration of risks, benefits and other options for treatment, the patient has consented to  Procedure(s) (LRB): REMOVAL OF ABDOMINAL AORTIC STENT GRAFT (N/A) as a surgical intervention .  The patient's history has been reviewed, patient examined, no change in status, stable for surgery.  I have reviewed the patient's chart and labs.  Questions were answered to the patient's satisfaction.     FIELDS,CHARLES E

## 2012-01-21 NOTE — Progress Notes (Signed)
ANTIBIOTIC CONSULT NOTE - FOLLOW UP  Pharmacy Consult for Rifampin IV, also on Vancomycin and Meropenem per Rx Indication: infected stent graft, s/p removal today  No Known Allergies  Patient Measurements: Height: 6\' 2"  (188 cm) Weight: 208 lb 12.4 oz (94.7 kg) IBW/kg (Calculated) : 82.2   Vital Signs: BP: 96/66 mmHg (08/23 1600) Pulse Rate: 69  (08/23 1815) Intake/Output from previous day: 08/22 0701 - 08/23 0700 In: 240 [P.O.:240] Out: 500 [Urine:500] Intake/Output from this shift: Total I/O In: 15434.8 [I.V.:9843.8; Blood:4311; NG/GT:30; IV Piggyback:1250] Out: 5545 [Urine:445; Emesis/NG output:100; Blood:5000]  Labs:  Basename 01/21/12 1800 01/21/12 1600 01/21/12 1529  WBC 8.8 11.9* --  HGB 11.1* 9.7* 8.2*  PLT 126* 149* --  LABCREA -- -- --  CREATININE -- 0.87 --   Estimated Creatinine Clearance: 105 ml/min (by C-G formula based on Cr of 0.87).   Assessment:  s/p infected stent graft removal today. Oliguria post-op, now on Phenylephrine drip and hydrating. OUP increasing.  To begin Rifampin IV while unable to take PO.  Had been on Rifampin 300 mg PO BID.  Vancomycin trough level 15.5 mcg/ml on 01/19/12, at goal.    Goal of Therapy:  Vancomycin trough level 15-20 mcg/ml appropriate Meropenem and Rifampin doses for renal function & infection  Plan:   Rifampin 600 mg IV q24hrs, once daily while on IV form.  Continue Meropenem 2 grams IV q8hrs and Vancomycin 1250 mg IV q8hrs.  Will follow renal function for any need to adjust regimens.   Dennie Fetters, RPh Pager: [phone] 01/21/2012,6:30 PM

## 2012-01-21 NOTE — Procedures (Signed)
Central Venous Catheter Insertion Procedure Note Alfred [name] [id number] 1951-04-26  Procedure: Insertion of Central Venous Catheter Indications: Drug and/or fluid administration  Procedure Details Consent: Risks of procedure as well as the alternatives and risks of each were explained to the (patient/caregiver).  Consent for procedure obtained. Time Out: Verified patient identification, verified procedure, site/side was marked, verified correct patient position, special equipment/implants available, medications/allergies/relevent history reviewed, required imaging and test results available.  Performed  Maximum sterile technique was used including antiseptics, cap, gloves, gown, hand hygiene, mask and sheet. Skin prep: Chlorhexidine; local anesthetic administered A antimicrobial bonded/coated triple lumen catheter was placed in the left internal jugular vein using the Seldinger technique.  Evaluation Blood flow good Complications: No apparent complications Patient did tolerate procedure well. Chest X-ray ordered to verify placement.  CXR: pending.  Performed by Canary Brim, NP using u/s guidance.  Coralyn Helling, MD [facility] Pulmonary/Critical Care 01/21/2012, 5:27 PM Pager:  [phone] After 3pm call: [phone]

## 2012-01-21 NOTE — Anesthesia Procedure Notes (Signed)
Procedure Name: Intubation Date/Time: 01/21/2012 7:59 AM Performed by: Margaree Mackintosh Pre-anesthesia Checklist: Patient identified, Timeout performed, Emergency Drugs available, Suction available and Patient being monitored Patient Re-evaluated:Patient Re-evaluated prior to inductionOxygen Delivery Method: Circle system utilized Preoxygenation: Pre-oxygenation with 100% oxygen Intubation Type: IV induction Ventilation: Mask ventilation without difficulty and Oral airway inserted - appropriate to patient size Laryngoscope Size: Mac and 4 Grade View: Grade II Tube type: Oral Tube size: 8.0 mm Number of attempts: 1 Airway Equipment and Method: Stylet and LTA kit utilized Placement Confirmation: ETT inserted through vocal cords under direct vision,  positive ETCO2 and breath sounds checked- equal and bilateral Secured at: 22 cm Tube secured with: Tape Dental Injury: Teeth and Oropharynx as per pre-operative assessment

## 2012-01-21 NOTE — H&P (View-Only) (Signed)
[facility]  Subjective  -   [name] was admitted due to fever and complaints of flank pain with suspected stent graft infection.  No new complaints this am.     Objective 99/64 82 99.2 F (37.3 C) (Oral) 18 96%  Intake/Output Summary (Last 24 hours) at 01/20/12 0804 Last data filed at 01/19/12 1700  Gross per 24 hour  Intake    820 ml  Output      0 ml  Net    820 ml   Bilateral LE N/V/M intact Lungs clear no acute distress Heart RRR   Assessment/Planning: OR for open AAA and removal of stent graft NPO past MN     COLLINS, EMMA MAUREEN 01/20/2012 8:04 AM --  Laboratory Lab Results:  Basename 01/18/12 1250  WBC 2.9*  HGB 10.5*  HCT 31.7*  PLT 227   BMET  Basename 01/18/12 1250  NA 132*  K 4.1  CL 95*  CO2 30  GLUCOSE 144*  BUN 8  CREATININE 0.65  CALCIUM 8.8    COAG Lab Results  Component Value Date   INR 1.16 01/18/2012   INR 1.24 01/04/2012   INR 1.50* 09/06/2011   No results found for this basename: PTT    Antibiotics Anti-infectives     Start     Dose/Rate Route Frequency Ordered Stop   01/18/12 2200   rifampin (RIFADIN) capsule 300 mg        300 mg Oral Every 12 hours 01/18/12 1356     01/18/12 1600   meropenem (MERREM) 1 g in sodium chloride 0.9 % 100 mL IVPB  Status:  Discontinued        1 g 200 mL/hr over 30 Minutes Intravenous Every 8 hours 01/18/12 1518 01/18/12 1537   01/18/12 1600   meropenem (MERREM) 2 g in sodium chloride 0.9 % 100 mL IVPB        2 g 200 mL/hr over 30 Minutes Intravenous Every 8 hours 01/18/12 1547     01/18/12 1500   vancomycin (VANCOCIN) IVPB 1000 mg/200 mL premix  Status:  Discontinued        1,000 mg 200 mL/hr over 60 Minutes Intravenous Every 8 hours 01/18/12 1351 01/18/12 1447   01/18/12 1500   meropenem (MERREM) 2 g in sodium chloride 0.9 % 100 mL IVPB  Status:  Discontinued        2 g 200 mL/hr over 30 Minutes Intravenous Every 8 hours 01/18/12 1354 01/18/12  1517   01/18/12 1500   vancomycin (VANCOCIN) 1,250 mg in sodium chloride 0.9 % 250 mL IVPB        1,250 mg 166.7 mL/hr over 90 Minutes Intravenous Every 8 hours 01/18/12 1448     01/18/12 1400   meropenem (MERREM) 2 g in sodium chloride 0.9 % 100 mL IVPB  Status:  Discontinued        2 g 200 mL/hr over 30 Minutes Intravenous 3 times per day 01/18/12 1152 01/18/12 1354   01/18/12 1300   rifampin (RIFADIN) capsule 300 mg  Status:  Discontinued        300 mg Oral 2 times daily 01/18/12 1152 01/18/12 1356   01/18/12 1200   vancomycin (VANCOCIN) IVPB 1000 mg/200 mL premix  Status:  Discontinued        1,000 mg 200 mL/hr over 60 Minutes Intravenous Every 8 hours 01/18/12 1152 01/18/12 1351             Details   as above exam unchanged Several questions answered  For stent graft removal tomorrow Set up 6 u rbc  Amilee Janvier, MD [facility]  Office: [phone] Pager: [phone]  

## 2012-01-21 NOTE — Anesthesia Preprocedure Evaluation (Addendum)
Anesthesia Evaluation  Patient identified by MRN, date of birth, ID band Patient awake    Reviewed: Allergy & Precautions, H&P , NPO status , Patient's Chart, lab work & pertinent test results, reviewed documented beta blocker date and time   History of Anesthesia Complications Negative for: history of anesthetic complications  Airway Mallampati: I TM Distance: >3 FB Neck ROM: full    Dental   Pulmonary neg pulmonary ROS, Current Smoker,          Cardiovascular hypertension, Pt. on medications and Pt. on home beta blockers + CAD, + Cardiac Stents and + Peripheral Vascular Disease Rhythm:regular Rate:Normal  S/p AAA stent graft 08/2011 now infected and presents for removal. A/w left radicular pain and groin pain. Denies recent chest pain or shortness of breath.    Neuro/Psych PSYCHIATRIC DISORDERS Depression  Neuromuscular disease negative neurological ROS     GI/Hepatic negative GI ROS, Neg liver ROS,   Endo/Other  negative endocrine ROS  Renal/GU negative Renal ROS     Musculoskeletal   Abdominal   Peds  Hematology negative hematology ROS (+)   Anesthesia Other Findings   Reproductive/Obstetrics                         Anesthesia Physical Anesthesia Plan  ASA: III  Anesthesia Plan: General   Post-op Pain Management:    Induction: Intravenous  Airway Management Planned: Oral ETT  Additional Equipment: Arterial line, CVP and PA Cath  Intra-op Plan:   Post-operative Plan: Possible Post-op intubation/ventilation  Informed Consent: I have reviewed the patients History and Physical, chart, labs and discussed the procedure including the risks, benefits and alternatives for the proposed anesthesia with the patient or authorized representative who has indicated his/her understanding and acceptance.     Plan Discussed with: CRNA, Anesthesiologist and Surgeon  Anesthesia Plan Comments:          Anesthesia Quick Evaluation

## 2012-01-21 NOTE — Progress Notes (Signed)
eLink Physician-Brief Progress Note Patient Name: Alfred [name] DOB: 1950-06-20 MRN: [id number]  Date of Service  01/21/2012   HPI/Events of Note  Call from nurse reporting that since placement on prn sedation with F/V patient has been difficult to adequately sedate and when he is finally sedated then he is hypotensive requiring increased doses of NEO for BP support.  Had been on precedex earlier and did well with this regimen.  Review of PCCM consult note shows no contraindication to restart of precedex.   eICU Interventions  Plan: Restart precedex for intubated patient Reduce prn order for fentanyl Continue prn versed as needed   Intervention Category Intermediate Interventions: Hypotension - evaluation and management Minor Interventions: Agitation / anxiety - evaluation and management  Charron Coultas 01/21/2012, 11:43 PM

## 2012-01-21 NOTE — Preoperative (Addendum)
Beta Blockers   Reason not to administer Beta Blockers:Not Applicable, RN held Metoprolol for low BP

## 2012-01-22 ENCOUNTER — Inpatient Hospital Stay (HOSPITAL_COMMUNITY): Payer: Managed Care, Other (non HMO)

## 2012-01-22 DIAGNOSIS — I714 Abdominal aortic aneurysm, without rupture, unspecified: Secondary | ICD-10-CM

## 2012-01-22 LAB — TYPE AND SCREEN
Unit division: 0
Unit division: 0
Unit division: 0
Unit division: 0

## 2012-01-22 LAB — PREPARE FRESH FROZEN PLASMA
Unit division: 0
Unit division: 0

## 2012-01-22 LAB — COMPREHENSIVE METABOLIC PANEL
ALT: 14 U/L (ref 0–53)
CO2: 23 mEq/L (ref 19–32)
Calcium: 8.7 mg/dL (ref 8.4–10.5)
Creatinine, Ser: 1.17 mg/dL (ref 0.50–1.35)
GFR calc Af Amer: 77 mL/min — ABNORMAL LOW (ref >90)
GFR calc non Af Amer: 66 mL/min — ABNORMAL LOW (ref >90)
Glucose, Bld: 138 mg/dL — ABNORMAL HIGH (ref 70–99)
Sodium: 134 mEq/L — ABNORMAL LOW (ref 135–145)
Total Protein: 4.6 g/dL — ABNORMAL LOW (ref 6.0–8.3)

## 2012-01-22 LAB — POCT I-STAT 3, ART BLOOD GAS (G3+)
Bicarbonate: 23.8 mEq/L (ref 20.0–24.0)
Patient temperature: 98.6
TCO2: 25 mmol/L (ref 0–100)

## 2012-01-22 LAB — CARDIAC PANEL(CRET KIN+CKTOT+MB+TROPI): CK, MB: 1.8 ng/mL (ref 0.3–4.0)

## 2012-01-22 LAB — CBC
Hemoglobin: 10.3 g/dL — ABNORMAL LOW (ref 13.0–17.0)
MCH: 29.4 pg (ref 26.0–34.0)
MCHC: 34.9 g/dL (ref 30.0–36.0)
MCV: 84.3 fL (ref 78.0–100.0)
Platelets: 151 10*3/uL (ref 150–400)
RBC: 3.5 MIL/uL — ABNORMAL LOW (ref 4.22–5.81)

## 2012-01-22 LAB — PREPARE PLATELET PHERESIS

## 2012-01-22 LAB — GLUCOSE, CAPILLARY: Glucose-Capillary: 135 mg/dL — ABNORMAL HIGH (ref 70–99)

## 2012-01-22 LAB — AMYLASE: Amylase: 185 U/L — ABNORMAL HIGH (ref 0–105)

## 2012-01-22 MED ORDER — MORPHINE SULFATE (PF) 1 MG/ML IV SOLN
INTRAVENOUS | Status: DC
  Administered 2012-01-22: 22 mg via INTRAVENOUS
  Administered 2012-01-22: 18:00:00 via INTRAVENOUS
  Administered 2012-01-22: 19 mg via INTRAVENOUS
  Administered 2012-01-23: 13.5 mg via INTRAVENOUS
  Administered 2012-01-23: 8 mg via INTRAVENOUS
  Administered 2012-01-23: 10 mg via INTRAVENOUS
  Administered 2012-01-23: 10.5 mg via INTRAVENOUS
  Administered 2012-01-23: 12.5 mg via INTRAVENOUS
  Administered 2012-01-24: 21:00:00 via INTRAVENOUS
  Administered 2012-01-24: 16.2 mg via INTRAVENOUS
  Administered 2012-01-24: 7.5 mg via INTRAVENOUS
  Administered 2012-01-24 (×2): 9 mg via INTRAVENOUS
  Administered 2012-01-24: 13.5 mg via INTRAVENOUS
  Administered 2012-01-24: 4.5 mg via INTRAVENOUS
  Administered 2012-01-25: 2.09 mg via INTRAVENOUS
  Administered 2012-01-25: 4.5 mg via INTRAVENOUS
  Filled 2012-01-22 (×8): qty 25

## 2012-01-22 MED ORDER — SODIUM CHLORIDE 0.9 % IJ SOLN
10.0000 mL | INTRAMUSCULAR | Status: DC | PRN
  Administered 2012-01-22 – 2012-01-25 (×3): 10 mL
  Administered 2012-01-25: 20 mL
  Administered 2012-01-26 – 2012-01-28 (×3): 10 mL

## 2012-01-22 MED ORDER — ONDANSETRON HCL 4 MG/2ML IJ SOLN: 4.0000 mg | Freq: Four times a day (QID) | INTRAMUSCULAR | Status: DC | PRN

## 2012-01-22 MED ORDER — FENTANYL CITRATE 0.05 MG/ML IJ SOLN
100.0000 ug | INTRAMUSCULAR | Status: DC | PRN
  Administered 2012-01-22 (×2): 100 ug via INTRAVENOUS
  Administered 2012-01-22: 50 ug via INTRAVENOUS
  Filled 2012-01-22 (×3): qty 2

## 2012-01-22 MED ORDER — DIPHENHYDRAMINE HCL 50 MG/ML IJ SOLN: 12.5000 mg | Freq: Four times a day (QID) | INTRAMUSCULAR | Status: DC | PRN

## 2012-01-22 MED ORDER — SODIUM CHLORIDE 0.9 % IJ SOLN
10.0000 mL | Freq: Two times a day (BID) | INTRAMUSCULAR | Status: DC
Start: 2012-01-22 — End: 2012-01-28
  Administered 2012-01-23 – 2012-01-24 (×3): 10 mL

## 2012-01-22 MED ORDER — NALOXONE HCL 0.4 MG/ML IJ SOLN: 0.4000 mg | INTRAMUSCULAR | Status: DC | PRN

## 2012-01-22 MED ORDER — DIPHENHYDRAMINE HCL 12.5 MG/5ML PO ELIX
12.5000 mg | ORAL_SOLUTION | Freq: Four times a day (QID) | ORAL | Status: DC | PRN
  Filled 2012-01-22: qty 5

## 2012-01-22 MED ORDER — SODIUM CHLORIDE 0.9 % IJ SOLN: 9.0000 mL | INTRAMUSCULAR | Status: DC | PRN

## 2012-01-22 NOTE — Progress Notes (Signed)
Name: Alfred [name] MRN: [id number] DOB: 1950-10-01    LOS: 4  Referring Provider:  Fabienne Bruns Reason for Referral:  Respiratory failure  PULMONARY / CRITICAL CARE MEDICINE  HPI:  61 y/o M with PMH of HTN, CAD s/p DES to RCA & CFX, AAA s/p repair through bilateral groin cutdown 09/06/11. Had continued left flank pain radiating to groin. 7/26 underwent CT ABD/Pelvis which demonstrated periaortic infection and 3 x 5.2 cm fluid collection at the left posterolateral aspect of the aortic endo graft. Followed by ID and treated for presumed endovascular graft infection (vancomycin, rifampin, ceftriaxone and metronidazole). Repeat CT on 8/5 showed progression of periaortic infection with increase in size 6.1 x 5.4 cm abscess. 8/6 underwent CT guided aspiration of 4ml of turbid fluid. Admitted 8/20 for continued fevers, planned graft removal. 8/23 underwent removal of infected Gore Excluder stent graft, aorto biiliac graft with cadaveric cryopreserved aorta. PCCM consulted for post op ICU / Vent mgmt. In OR received approximately 8 units PRBC's, 1 pk platlets, 2 FFP.    Brief patient description:  61 yo male s/p removal of infected AAA graft.  Required large volume transfusion intra-op.  Remained on vent, pressors post-op 8/23 and PCCM consulted.  Events Since Admission: 8/23 OR  Current Status: Remains on pressors. Not ready to extubate.  Vital Signs: Temp:  [93.8 F (34.3 C)-98.5 F (36.9 C)] 97.6 F (36.4 C) (08/24 0400) Pulse Rate:  [69-101] 80  (08/24 0700) Resp:  [14-23] 14  (08/24 0700) BP: (90-102)/(55-76) 98/58 mmHg (08/24 0700) SpO2:  [96 %-100 %] 100 % (08/24 0700) Arterial Line BP: (70-141)/(58-91) 93/58 mmHg (08/24 0700) FiO2 (%):  [40 %-50 %] 40 % (08/24 0600) Weight:  [122.5 kg (270 lb 1 oz)] 122.5 kg (270 lb 1 oz) (08/24 0600)  Physical Examination: General:  Ill appearing but alert Neuro:  Alert on precedex HEENT:  ETT, Rt IJ in place.  Pupils reactive. Neck:   supple Cardiovascular:  s1s2 regular, no murmur, extremities are cool LE Lungs:  Decreased bs, scattered rhonchi, no wheeze Abdomen:  Wound dressing clean, no bowel sounds Musculoskeletal:  No edema, extremities cool Skin:  Raised, skin colored rash on upper and lower extremities with sparing of trunk and face    ASSESSMENT AND PLAN  PULMONARY  Lab 01/22/12 0400 01/21/12 1710 01/21/12 1529 01/21/12 1427 01/21/12 1157  PHART 7.466* 7.404 7.310* 7.316* 7.455*  PCO2ART 33.0* 33.5* 43.5 44.3 39.8  PO2ART 112.0* 186.0* 409.0* 366.0* 415.0*  HCO3 23.8 21.5 22.6 23.4 28.5*  O2SAT 99.0 100.0 100.0 100.0 100.0   Ventilator Settings: Vent Mode:  [-] PRVC FiO2 (%):  [40 %-50 %] 40 % Set Rate:  [14 bmp] 14 bmp Vt Set:  [660 mL] 660 mL PEEP:  [5 cmH20] 5 cmH20 Plateau Pressure:  [17 cmH20-19 cmH20] 18 cmH20  CXR: 8/24:  ETT ok , low lung volumes, mild vasc congestion ETT:  8/23>>  A:  Acute respiratory failure after removal of infected AAA graft. P:   Full vent support until off vasopressors Continue 8 cc/kg Vt F/u CXR in am PRN BD's Monitor for TRALI  CARDIOVASCULAR  Lab 01/22/12 0412 01/21/12 2212 01/21/12 1612  TROPONINI <0.30 <0.30 <0.30  LATICACIDVEN -- -- --  PROBNP -- -- --    Lines:  Rt PICC 8/12>> Rt IJ cortise 8/23>> Lt IJ CVL 8/23>>  A: Hypovolemic, hemorrhagic, septic shock.  I>>O +13L P:  Continue IV fluids to keep CVP > 8 but reduce to 75cc/hr Wean pressors  to keep SBP > 90, MAP > 65 F/u cardiac enzymes May need to check echo    RENAL  Lab 01/22/12 0420 01/21/12 1600 01/21/12 1529 01/21/12 1427 01/21/12 1314 01/18/12 1250  NA 134* 136 135 134* 133* --  K 5.2* 4.7 -- -- -- --  CL 104 103 -- -- -- 95*  CO2 23 22 -- -- -- 30  BUN 13 10 -- -- -- 8  CREATININE 1.17 0.87 -- -- -- 0.65  CALCIUM 8.7 9.9 -- -- -- 8.8  MG 2.0 1.6 -- -- -- --  PHOS -- -- -- -- -- --   Intake/Output      08/23 0701 - 08/24 0700 08/24 0701 - 08/25 0700   P.O.     I.V.  (mL/kg) 12117.3 (98.9)    Blood 4311    NG/GT 120    IV Piggyback 2610    Total Intake(mL/kg) 19158.3 (156.4)    Urine (mL/kg/hr) 1135 (0.4)    Emesis/NG output 250    Blood 5000    Total Output 6385    Net +12773.3          Foley:  8/23>>  A:  Oliguria slowly better, rising Cr with Acute renal insufficiency d/t hypoperfusion P:   Monitor renal fx, urine outpt, electrolytes Maintain hemodynamics  GASTROINTESTINAL  Lab 01/22/12 0420 01/18/12 1250  AST 22 15  ALT 14 11  ALKPHOS 43 68  BILITOT 1.2 0.4  PROT 4.6* 6.6  ALBUMIN 2.3* 2.5*    A:  Nutrition. Postop ileus P:   NPO for now  HEMATOLOGIC  Lab 01/22/12 0420 01/21/12 1800 01/21/12 1600 01/21/12 1529 01/21/12 1427 01/21/12 1420 01/18/12 1250  HGB 10.3* 11.1* 9.7* 8.2* 7.8* -- --  HCT 29.5* 32.2* 28.2* 24.0* 23.0* -- --  PLT 151 126* 149* -- -- -- 227  INR -- -- 1.56* -- -- 1.78* 1.16  APTT -- -- 39* -- -- 41* --   A:  Acute blood loss anemia during surgery. P:  F/u CBC, coags Transfuse as needed for bleeding and to maintain Hb > 7  INFECTIOUS  Lab 01/22/12 0420 01/21/12 1800 01/21/12 1600 01/18/12 1250  WBC 7.3 8.8 11.9* 2.9*  PROCALCITON -- -- -- --   Cultures: 8/23 Abd abscess>>  Antibiotics: 8/20 Vancomycin (graft abscess) >> 8/20 Meropenem(graft abscess)>> 8/20 Rifampin(graft abscess)>>  A:  Abdominal abscess with infected AAA graft. P:   Abx as above F/u wound cultures May need ID input to assist with abx management  ENDOCRINE  Lab 01/22/12 0349 01/21/12 2357 01/21/12 2032 01/21/12 1802  GLUCAP 125* 127* 129* 170*   A:  Hyperglycemia. P:   SSI  NEUROLOGIC  A:  Sedation, pain control. P:   Sedation protocol while on vent>>better with fentanyl and precedex  DERMATOLOGY  A: Rash ?from betadine after surgery. P: PRN benadryl Try to avoid steroids  BEST PRACTICE / DISPOSITION Level of Care:  ICU Primary Service:  VVS Consultants:  PCCM Code Status:  Full Diet:  NPO DVT  Px:  SCD  GI Px:  Protonix Skin Integrity:  Rash on extremities 8/23 Social / Family:  No family at bedside  Critical care time 35 minutes.  Shan Levans MD Beeper  [phone]  Cell  [phone]  If no response or cell goes to voicemail, call beeper [phone]  01/22/2012, 7:58 AM

## 2012-01-22 NOTE — Progress Notes (Signed)
Subjective: Interval History: none.Alert on vent.  pain control.   Objective: Vital signs in last 24 hours: Temp:  [93.8 F (34.3 C)-98.5 F (36.9 C)] 97.6 F (36.4 C) (08/24 0400) Pulse Rate:  [69-101] 80  (08/24 0700) Resp:  [14-23] 14  (08/24 0700) BP: (90-102)/(55-76) 98/58 mmHg (08/24 0700) SpO2:  [96 %-100 %] 100 % (08/24 0700) Arterial Line BP: (70-141)/(58-91) 93/58 mmHg (08/24 0700) FiO2 (%):  [40 %-50 %] 40 % (08/24 0600) Weight:  [270 lb 1 oz (122.5 kg)] 270 lb 1 oz (122.5 kg) (08/24 0600)  Intake/Output from previous day: 08/23 0701 - 08/24 0700 In: 19158.3 [I.V.:12117.3; Blood:4311; NG/GT:120; IV Piggyback:2610] Out: 6385 [Urine:1135; Emesis/NG output:250; Blood:5000] Intake/Output this shift:    Abd soft, mod tender Ext. 2= fem pulses, 1= popliteal pulse Lab Results:  Basename 01/22/12 0420 01/21/12 1800  WBC 7.3 8.8  HGB 10.3* 11.1*  HCT 29.5* 32.2*  PLT 151 126*   BMET  Basename 01/22/12 0420 01/21/12 1600  NA 134* 136  K 5.2* 4.7  CL 104 103  CO2 23 22  GLUCOSE 138* 198*  BUN 13 10  CREATININE 1.17 0.87  CALCIUM 8.7 9.9    Studies/Results: Ct Abdomen Pelvis W Contrast  12/24/2011  *RADIOLOGY REPORT*  Clinical Data:  Lower abdominal pain for several weeks with associated intermittent fevers not responsive to Vicodin, superolateral Flagyl.  History of EVAR for AAA on 09/06/2011  CT ABDOMEN AND PELVIS WITH CONTRAST  Technique:  Multidetector CT imaging of the abdomen and pelvis was performed following the standard protocol during bolus administration of intravenous contrast.  Contrast: OMNIPAQUE IOHEXOL 300 MG/ML  SOLN  Comparison: CT abdomen and pelvis 10/07/2011, and pretreatment CT scan 08/19/2011.  Findings:  VASCULAR  Aorta: Interval development of a 3.3 x 2.0 cm ring enhancing fluid collection at the left posterolateral aspect of the of the excluded infrarenal abdominal aortic aneurysm sac.  The fluid collection is located at the level of the  endoprosthesis flow divider. There is associated stranding of the retroperitoneal periaortic fat extending inferiorly along the iliac vessels to the pelvis. The Gore Excluder endograft is in unchanged position.  No crimping, or thrombosis.  The aneurysm sac itself is unchanged in size at 5.6 x 5.6 cm in greatest transverse dimension.  Additionally, there is no enhancement of the excluded aneurysm sac on the portal venous, or delayed phase images confirming that there is no endoleak.  Celiac: Patent, unremarkable  SMA: Patent, unremarkable  Renals: Single renal arteries bilaterally.  The renal arteries are widely patent.  Next flow next flow minimal atherosclerotic vascular disease, no evidence of focal stenosis, or aneurysmal dilatation.  Hypogastric arteries remain patent bilaterally. Interventional radiology  IMA: Excluded by the Endograft.  Distal branches fill via retrograde flow.  Inflow: The trace atherosclerotic vascular disease.  Bilateral hypogastric arteries remain patent.  Surgical changes of bilateral groins consistent with bilateral surgical cut downs.  Veins: Limited evaluation of the veins given non venous timing.  No gross abnormality.  NON-VASCULAR  Lower Chest: Normal sized heart.  Atherosclerotic vascular calcification noted in the left anterior descending, circumflex and right coronary arteries.  No pericardial effusion.  Lung bases are clear.  Abdomen: Diffuse hypoattenuation of the liver consistent with steatosis.  The spleen, pancreas, adrenal glands, and kidneys are unremarkable.  Scattered colonic diverticular disease without active inflammation to suggest acute diverticulitis.  No free fluid in the abdomen, or pelvis. No suspicious adenopathy.  Pelvis: Normal bladder, prostate and seminal vesicles.  No free fluid in the pelvis.  No suspicious pelvic adenopathy.  Bones: No acute fracture or aggressive appearing lytic or blastic osseous lesion.  IMPRESSION:  1.  CT images are concerning for  periaortic infection with possible involvement of the aortic endoprosthesis.  Interval development of a 3 x 5.2 cm rim enhancing fluid collection at the left posterolateral aspect of the aortic endograft with associated inflammatory stranding of the a periaortic retroperitoneal fat. Mild inflammatory stranding continues inferiorly along the course of the iliac vessels into the pelvis.  The finding is located at the L3-L4 interspace and begins at the region of the flow divider of the endovascular graft.  This fluid collection is separate from the excluded aneurysm sac.  Recommend clinical correlation with serum CRP and ESR. Additionally, a tagged white blood cell study may be useful to confirm the presence of infection associated with the endograft and periaortic space.  2. The aneurysm sac itself is intact, and unchanged in size compared to prior.  There is no evidence of endoleak on delayed imaging.  Critical Value/emergent results were called by telephone at the time of interpretation on 12/24/2011 at 1530 to Dr. Marga Melnick, and also to Dr. Fabienne Bruns at approximately 1600., who verbally acknowledged these results.  Signed,  Sterling Big, MD Vascular & Interventional Radiologist [facility]  Original Report Authenticated By: Threasa Beards Angio Ao+bifem W/cm &/or Wo/cm  01/18/2012  *RADIOLOGY REPORT*  Clinical Data:  61 year old male with suspected abdominal aortic stent graft infection  CT ANGIOGRAPHY OF ABDOMINAL AORTA WITH ILIOFEMORAL RUNOFF  Technique:  Multidetector CT imaging of the abdomen, pelvis and lower extremities was performed using the standard protocol during bolus administration of intravenous contrast.  Multiplanar CT image reconstructions including MIPs were obtained to evaluate the vascular anatomy.  Contrast: OMNIPAQUE IOHEXOL 350 MG/ML SOLN  Comparison:   Prior CTA 01/09/2012 and 01/03/2012  Findings:  VASCULAR  Aorta: Infrarenal abdominal aortic aneurysm status  post EVAR with a bifurcated Gore endoprosthesis.  The aneurysm sac is effectively excluded, there is no evidence of endoleak.  The aneurysm sac is unchanged in size with a maximal diameter of 5.1 cm.  The size of the periaortic phlegmon and gas collection has decreased compared to the prior study from 01/09/2012.  The collection measures approximately 3.3 x 3.9 cm compared to 5.8 x 4.2 cm.  The locules involving the psoas muscle are less conspicuous on today's examination.  However, there is persistent irregular soft tissue density and inflammatory stranding encircling the excluded aneurysm sac and extending inferiorly to the bifurcation, and then along the left common iliac artery and proximal external and internal iliac arteries.  Celiac: Widely patent.  Conventional hepatic arterial anatomy.  SMA: Widely patent  Renals: Single renal arteries bilaterally.  Both are widely patent.  IMA: Excluded by the Endograft, but fills via retrograde flow.  The proximal most portion of the IMA is surrounded by inflammatory change around the excluded aneurysm sac.  No definite pseudoaneurysm, or other complicating feature identified.  Inflow: The iliac limbs of the Gore endoprosthesis extend into the common iliac arteries bilaterally. No evidence of limb occlusion. The bilateral external and internal iliac arteries are widely patent with only trace atherosclerotic vascular calcifications.  As discussed above, inflammatory stranding extends along the left common iliac artery and the proximal aspects of the left external and internal iliac arteries.  Mild calcified atherosclerotic vascular calcification noted along the posterior walls of the bilateral common femoral arteries.  Outflow: The bilateral superficial femoral and profunda femoral arteries are widely patent.  Only trace atherosclerotic vascular disease.  No focal stenosis.  Mild atherosclerotic vascular disease noted in the bilateral popliteal arteries without focal  stenosis.  Runoff: Three-vessel runoff on the right.  The anterior tibial, posterior tibial and peroneal arteries are patent to the ankle.  On the left, there is two-vessel runoff to the ankle.  The anterior tibial artery and peroneal arteries provide the runoff.  The peroneal artery fills the posterior tibial artery at the ankle via collateral flow.  The posterior tibial artery appears atretic throughout its course.  Veins: Given the limitations of non venous phase imaging, no focal abnormality.  NON-VASCULAR  Lower Chest: Linear atelectasis in the inferior lingula and left greater than right lower lobes.  Otherwise, the visualized lower thorax is unremarkable.  Abdomen: Diffuse low attenuation throughout the hepatic parenchyma consistent with steatosis.  The spleen, pancreas, adrenal glands and kidneys are unremarkable.  Scattered colonic diverticular disease without active inflammation.  Periaortic inflammatory changes, and a phlegmon as detailed above under the vascular section.  There are small scattered left periaortic and retroperitoneal lymph nodes which are likely reactive.  Pelvis: Normal bladder, prostate and seminal vesicles.  Bones: No acute fracture or aggressive appearing lytic or blastic osseous lesion.   Review of the MIP images confirms the above findings.  IMPRESSION:  1.  Persistent, but improving periaortic inflammatory phlegmon. The main collection measures 3.9 x 3.3 cm today compared to 5.8 x 4.2 cm on 01/09/2012.  The locules within the left psoas muscle are significantly less conspicuous on today's examination.  The amount of a shaggy inflammatory stranding encircling the excluded aneurysm sac and extending along the iliac vessels into the upper pelvis appears unchanged.  2.  Infrarenal abdominal aortic aneurysm status post endovascular repair with a Gore bifurcated endoprosthesis.  The excluded aneurysm sac is unchanged in size with a maximal dimension of 5.1 cm.  No evidence of endoleak.   3.  No significant inflow, outflow or runoff atherosclerotic vascular disease.  There is three-vessel runoff to the right ankle, and two-vessel runoff left.  Signed,  Sterling Big, MD Vascular & Interventional Radiologist [facility]   Original Report Authenticated By: Threasa Beards Aspiration  01/04/2012  *RADIOLOGY REPORT*  Clinical Data: Enlarging left retroperitoneal collection concerning for abscess and adjacent to the aorta and an indwelling aortic endograft.  The patient presents for aspiration of the collection to try to establish a diagnosis prior to possible surgery.  CT GUIDED ASPIRATION OF LEFT RETROPERITONEAL ABSCESS  Sedation:  3.0 mg IV Versed;  125 mcg IV Fentanyl  Total Moderate Sedation Time: 20 minutes.  Comparison:  CTA of the abdomen and pelvis performed yesterday.  Procedure:  The procedure, risks, benefits, and alternatives were explained to the patient.  Questions regarding the procedure were encouraged and answered. The patient understands and consents to the procedure.  The left translumbar region was prepped with Betadine in a sterile fashion, and a sterile drape was applied covering the operative field.  A sterile gown and sterile gloves were used for the procedure. Local anesthesia was provided with 1% Lidocaine.  CT was performed in a prone position.  Under CT guidance, an 18 gauge trocar needle was advanced into the left retroperitoneum. Aspiration was performed.  Lavage was then performed with approximately 4 ml of sterile saline.  Procedure a guidewire was also advanced out of the needle.  Complications: None  Findings:  Left retroperitoneal inflammatory process and complex fluid collection was localized.  The trocar needle was advanced through the enlarged psoas muscle to an area suspected to be more liquefied by CT yesterday.  Aspiration initially did not yield any free fluid.  After guide wire advancement and saline lavage, ultimately, approximately 4 ml of turbid  fluid was removed and sent for culture analysis.  IMPRESSION: CT guided aspiration of the left retroperitoneal process did not initially yield a significant amount of free fluid.  Ultimately, 4 ml of turbid fluid suspicious for developing abscess was able to be removed after guide wire advancement and sterile saline lavage. This fluid sample was sent for culture analysis.  Original Report Authenticated By: Reola Calkins, M.D.   Nm Wbc Scan Tumor  01/20/2012  *RADIOLOGY REPORT*  Clinical Data: Infected aortic graft.  NUCLEAR MEDICINE LOCALIZATION OF TUMOR LIMITED  Technique:  After intravenous injection of radiopharmaceutical, standard planar projections were obtained on subsequent days through the multiple area of interest.  Radiopharmaceutical: CURIE DTPA INDIUM-111 PENTETATE, CURIE TC-CERETEC TECHNETIUM TC 30M EXAMETAZIME IV KIT  Comparison: CT scan 01/18/2012.  Findings: There is normal expected uptake in the liver and spleen. Mild bone marrow activity is also normal.  There is abnormal uptake in the left lower paraspinal region which correlates with the infected aortic graft and left psoas involvement.  No other areas of abnormal uptake are identified.  IMPRESSION: Abnormal uptake of the labeled white blood cells in the region of the patient's infected aortic graft and left psoas abscess.   Original Report Authenticated By: P. Loralie Champagne, M.D.    Dg Chest Port 1 View  01/21/2012  *RADIOLOGY REPORT*  Clinical Data: Left IJ catheter placement.  PORTABLE CHEST - 1 VIEW  Comparison: Earlier film, same date.  Findings: The left IJ catheter tip is in the mid SVC.  It is against the lateral wall of the SVC.  The other support apparatus is stable.  Persistent low lung volumes with vascular crowding and atelectasis.  IMPRESSION: Left IJ catheter tip is against the lateral wall of the mid SVC.   Original Report Authenticated By: P. Loralie Champagne, M.D.    Dg Chest Portable 1  View  01/21/2012  *RADIOLOGY REPORT*  Clinical Data: For central line placement  PORTABLE CHEST - 1 VIEW  Comparison: Portable chest x-ray of 09/06/2011  Findings: The tip of the endotracheal tube is approximately 2.9 cm above the carina.  A right upper extremity PICC line is present with the tip overlying the region of the lower SVC.  A right IJ sheath is again noted overlying the lower right internal jugular vein.  No pneumothorax is seen.  The lungs are clear.  Mild cardiomegaly is stable.  IMPRESSION:  1.  Right PICC line tip overlies the region of the lower SVC. 2.  Venous sheath overlies the region of the lower right internal jugular vein. 3.  Endotracheal tip 2.9 cm above carina.   Original Report Authenticated By: Juline Patch, M.D.    Ct Angio Abd/pel W/ And/or W/o  01/09/2012  *RADIOLOGY REPORT*  Clinical Data:  Indwelling aorto bi-iliac stent graft for abdominal aortic aneurysm repair, with current history of perigraft infection for which the patient is currently on antibiotic therapy.  CT ANGIOGRAPHY ABDOMEN AND PELVIS  Technique:  Multidetector CT imaging of the abdomen and pelvis was performed using the standard protocol during bolus administration of intravenous contrast.  Multiplanar reconstructed images including MIPs were obtained and reviewed to  evaluate the vascular anatomy.  Contrast: OMNIPAQUE IOHEXOL 350 MG/ML.  Comparison:  CTA abdomen and pelvis 01/03/2012, 10/07/2011, 08/19/2011, and CT abdomen and pelvis 12/24/2011.  Findings:  No significant change in the size of the multiloculated abscess in the left side of the retroperitoneum, adjacent to the native abdominal aorta, maximum measurements currently approximating 8.2 x 5.7 x 8.3 cm (series 6, image 40 and series 606, image 48).  Stent graft widely patent.  No evidence of endoleak.  Single renal arteries widely patent.  Widely patent celiac and SMA.  Retrograde filling of the IMA.  No abscess elsewhere in the abdomen or pelvis.   Mild edema/inflammation in the retroperitoneum of the left upper pelvis, adjacent to the external iliac vessels, unchanged.  Diffuse hepatic steatosis without focal hepatic parenchymal abnormality.  Normal-appearing spleen, adrenal glands, and kidneys. Mild pancreatic atrophy with fatty replacement; no focal pancreatic parenchymal abnormality.  Gallbladder unremarkable by CT.  No biliary ductal dilation. No significant lymphadenopathy.  Stomach and small bowel normal in appearance.  Scattered sigmoid colon diverticula without evidence of acute diverticulitis; remainder of the colon unremarkable.  No ascites.  Urinary bladder decompressed and unremarkable.  Prostate gland and seminal vesicles normal for age.  Bone window images demonstrate ankylosis of the sacroiliac joints, degenerative changes involving the lumbar spine, and lumbar scoliosis convex left.  Visualized lung bases clear apart from minimal atelectasis in the deep left lower lobe.   Review of the MIP images confirms the above findings.  IMPRESSION:  1.  No significant change in the multiloculated abscess in the left retroperitoneum, associated with the native abdominal aorta, measured above. 2.  Patent aorto bi-iliac stent graft.  No evidence of endoleak.  3.  Stable mild edema/inflammation in the left upper pelvis adjacent to the left external iliac vessels, without abscess. 4.  Diffuse hepatic steatosis as noted previously. 5.  Mild pancreatic atrophy as noted previously.  Original Report Authenticated By: Arnell Sieving, M.D.   Ct Angio Abd/pel W/ And/or W/o  01/03/2012  *RADIOLOGY REPORT*  Clinical Data:  Continuous left lower back and hip pain. Post EVAR (08/2011).  CT ANGIOGRAPHY ABDOMEN AND PELVIS  Technique:  Multidetector CT imaging of the abdomen and pelvis was performed using the standard protocol during bolus administration of intravenous contrast.  Multiplanar reconstructed images including MIPs were obtained and reviewed to evaluate  the vascular anatomy.  Contrast: OMNIPAQUE IOHEXOL 350 MG/ML SOLN  Comparison:  CT abdomen pelvis - 12/24/2011; 10/07/2011  Arterial findings: Aorta:                  Post endovascular repair of infrarenal abdominal aortic aneurysm.  Review of the precontrast images are negative for intramural hematoma.  The caliber of the now excluded native infrarenal abdominal aorta is grossly unchanged measuring 5.3 x 5.2 cm in greatest transverse axial dimension (image 99, series 4). No definite evidence of endoleak.  Interval increase in the size of the irregular multi septated peripherally enhancing fluid collection about the left lateral posterior aspect of the native abdominal aorta with the collection now extending into the left iliopsoas musculature. The collection now measures approximately 6.1 x 5.4 cm in greatest transverse axial dimension (image 229, series four), and approximately 9 cm in length (image 69, series 601). The cranial aspect of the abscess appears to abut the right iliac gait (image 226, series four). There is no definite evidence of osteolysis within the adjacent lumbar spine to suggest osteomyelitis. Stranding extends into the left  hemipelvis.  Celiac axis:            Widely patent, possible branching pattern.  Superior mesenteric:Widely patent, possible branching pattern.  Left renal:             Solitary:  Widely patent  Right renal:            A solitary:  Widely patent  Inferior mesenteric:Appears patent at its origin, however again, there is no definitive evidence of Endo leak on this examination.  Iliac vessels:          The distal ends of the grafts appear well seated within their respective common iliac arteries.  The bilateral common iliac arteries are widely patent.  The bilateral external iliac arteries are tortuous but widely patent.   Review of the MIP images confirms the above findings.  Nonvascular findings:  Normal hepatic contour.  No discrete hepatic lesions.  Normal  gallbladder.  No definite intra or extrahepatic biliary duct dilatation.  No ascites.  There is symmetric enhancement of the bilateral kidneys.  No definite renal stones.  There is grossly symmetric bilateral perinephric stranding.  No urinary obstruction.  Normal appearance of the bilateral adrenal glands, pancreas and spleen.  Scattered colonic diverticulosis without evidence of diverticulitis.  The bowel is otherwise normal in course and caliber without wall thickening or evidence of obstruction.  The appendix is not visualized, with the prior history of prior appendectomy.  No pneumoperitoneum, pneumatosis or portal venous gas.  No definite retroperitoneal, mesenteric, pelvic or inguinal lymphadenopathy.  Incidental note is made of grossly unchanged small indirect mesenteric fat containing inguinal hernias.  Limited visualization of the lower thorax demonstrates minimal dependent linear ground-glass opacities, left greater than right, favored to represent subsegmental atelectasis.  No pleural effusion.  Normal heart size.  No pericardial effusion.  No acute or aggressive osseous abnormalities.  Specifically, there is no definitive CT evidence of diskitis/osteomyelitis.  IMPRESSION: 1.  Findings compatible with progression of periaortic infection with possible involvement/seeding of the EVAR prosthesis. Interval increase in size of the now approximately 6.1 x 5.4 cm peripherally enhancing abscess adjacent to the left posterior lateral aspect of the excluded native abdominal aorta, now with invasion of the iliopsoas musculature.  Note, the cranial aspect of the abscess appears to abut the endograft.  2. Unchanged caliber of the excluded native abdominal aortic aneurysm.  No evidence of endoleak.  Above findings discussed with Dr. Myra Gianotti at [phone].  Original Report Authenticated By: Waynard Reeds, M.D.   Anti-infectives: Anti-infectives     Start     Dose/Rate Route Frequency Ordered Stop   01/21/12 2200    rifampin (RIFADIN) 600 mg in sodium chloride 0.9 % 100 mL IVPB        600 mg 200 mL/hr over 30 Minutes Intravenous Every 24 hours 01/21/12 1702     01/21/12 1630   cefUROXime (ZINACEF) 1.5 g in dextrose 5 % 50 mL IVPB  Status:  Discontinued        1.5 g 100 mL/hr over 30 Minutes Intravenous Every 12 hours 01/21/12 1619 01/21/12 1645   01/18/12 2200   rifampin (RIFADIN) capsule 300 mg  Status:  Discontinued        300 mg Oral Every 12 hours 01/18/12 1356 01/21/12 1619   01/18/12 1600   meropenem (MERREM) 1 g in sodium chloride 0.9 % 100 mL IVPB  Status:  Discontinued        1 g 200 mL/hr over 30 Minutes  Intravenous Every 8 hours 01/18/12 1518 01/18/12 1537   01/18/12 1600   meropenem (MERREM) 2 g in sodium chloride 0.9 % 100 mL IVPB        2 g 200 mL/hr over 30 Minutes Intravenous Every 8 hours 01/18/12 1547     01/18/12 1500   vancomycin (VANCOCIN) IVPB 1000 mg/200 mL premix  Status:  Discontinued        1,000 mg 200 mL/hr over 60 Minutes Intravenous Every 8 hours 01/18/12 1351 01/18/12 1447   01/18/12 1500   meropenem (MERREM) 2 g in sodium chloride 0.9 % 100 mL IVPB  Status:  Discontinued        2 g 200 mL/hr over 30 Minutes Intravenous Every 8 hours 01/18/12 1354 01/18/12 1517   01/18/12 1500   vancomycin (VANCOCIN) 1,250 mg in sodium chloride 0.9 % 250 mL IVPB        1,250 mg 166.7 mL/hr over 90 Minutes Intravenous Every 8 hours 01/18/12 1448     01/18/12 1400   meropenem (MERREM) 2 g in sodium chloride 0.9 % 100 mL IVPB  Status:  Discontinued        2 g 200 mL/hr over 30 Minutes Intravenous 3 times per day 01/18/12 1152 01/18/12 1354   01/18/12 1300   rifampin (RIFADIN) capsule 300 mg  Status:  Discontinued        300 mg Oral 2 times daily 01/18/12 1152 01/18/12 1356   01/18/12 1200   vancomycin (VANCOCIN) IVPB 1000 mg/200 mL premix  Status:  Discontinued        1,000 mg 200 mL/hr over 60 Minutes Intravenous Every 8 hours 01/18/12 1152 01/18/12 1351           Assessment/Plan: s/p Procedure(s) (LRB): REMOVAL OF ABDOMINAL AORTIC STENT GRAFT (N/A) ANEURYSM ABDOMINAL AORTIC REPAIR (N/A) hEMODYNAMICALLY stable.  Labs ok, extubation per CCM   LOS: 4 days   Derian Pfost 01/22/2012, 7:26 AM

## 2012-01-22 NOTE — Plan of Care (Signed)
Problem: Phase I Progression Outcomes Goal: Weaning vasoactive drips Outcome: Completed/Met Date Met:  01/22/12

## 2012-01-22 NOTE — Procedures (Signed)
Extubation Procedure Note  Patient Details:   Name: Alfred [name] DOB: [birth year] MRN: [id number]   Airway Documentation:     Evaluation  O2 sats: stable throughout Complications: No apparent complications Patient did tolerate procedure well. Bilateral Breath Sounds: Clear Suctioning: Airway Yes Pt extubated per Dr. Delford Field. Pt extubated to a 4lpm [place]. Sp02 is 97%, rr 28. RT will continue to monitor pt progress.   Melanee Spry 01/22/2012, 1:19 PM

## 2012-01-22 NOTE — Progress Notes (Signed)
INITIAL ADULT NUTRITION ASSESSMENT Date: 01/22/2012   Time: 9:48 AM Reason for Assessment: Intubated/NPO  INTERVENTION: NPO due to post-op ileus RD to follow status/plan of care  ASSESSMENT: Male 61 y.o.  Dx: abdominal abscess with infected AAA graft Patient Active Problem List  Diagnosis  . COLONIC POLYPS, ADENOMATOUS  . Other and unspecified hyperlipidemia  . OBESITY  . DEPRESSION  . HYPERTENSION, BENIGN  . UNSPECIFIED ESSENTIAL HYPERTENSION  . CAD  . ABDOMINAL AORTIC ANEURYSM  . HYPERPLASIA PROSTATE UNS W/O UR OBST & OTH LUTS  . LOW BACK PAIN, ACUTE  . LUMBAR RADICULOPATHY, LEFT  . UNS ADVRS EFF UNS RX MEDICINAL&BIOLOGICAL SBSTNC  . Abdominal pain  . AAA (abdominal aortic aneurysm)  . Dyslipidemia  . Hyperplasia of prostate without lower urinary tract symptoms (LUTS)  . Hx of adenomatous colonic polyps  . Depression  . HTN (hypertension)  . CAD (coronary artery disease)  . Acute respiratory failure  . Shock  . Oliguria   Hx:  Past Medical History  Diagnosis Date  . Dyslipidemia   . Left lumbar radiculopathy   . Hyperplasia of prostate without lower urinary tract symptoms (LUTS)   . Other and unspecified hyperlipidemia   . Hx of adenomatous colonic polyps 2008  . HTN (hypertension)     unspecified essential  . CAD (coronary artery disease)     DES to RCA and CFX; Dr. Excell Seltzer cardiologist  . AAA (abdominal aortic aneurysm) 08/2011  . Anginal pain   . Arthritis     "knees; not bad"  . Depression ~ 2009    'treated for ~ 6 months"   Related Meds:     . antiseptic oral rinse  15 mL Mouth Rinse QID  . chlorhexidine  15 mL Mouth Rinse BID  . docusate sodium  100 mg Oral Daily  . insulin aspart  0-20 Units Subcutaneous Q4H  . magnesium sulfate 1 - 4 g bolus IVPB  2 g Intravenous Once  . meropenem (MERREM) IV  2 g Intravenous Q8H  . metoprolol  5 mg Intravenous Q6H  . pantoprazole (PROTONIX) IV  40 mg Intravenous QHS  . rifampin (RIFADIN) IVPB  600 mg  Intravenous Q24H  . vancomycin  1,250 mg Intravenous Q8H  . DISCONTD: aspirin EC  81 mg Oral QHS  . DISCONTD: atorvastatin  10 mg Oral QHS  . DISCONTD: cefUROXime (ZINACEF)  IV  1.5 g Intravenous Q12H  . DISCONTD: fentaNYL  50 mcg Transdermal Q72H  . DISCONTD: hydrochlorothiazide  25 mg Oral Daily  . DISCONTD: HYDROmorphone PCA 0.3 mg/mL   Intravenous Q4H  . DISCONTD: losartan  100 mg Oral Daily  . DISCONTD: metoprolol succinate  100 mg Oral QHS  . DISCONTD: pantoprazole  40 mg Oral Q1200  . DISCONTD: pantoprazole  40 mg Oral Q1200  . DISCONTD: potassium chloride SA  20 mEq Oral BID  . DISCONTD: potassium chloride  20-40 mEq Oral Once  . DISCONTD: rifampin  300 mg Oral Q12H  . DISCONTD: senna  1 tablet Oral BID  . DISCONTD: sodium chloride  3 mL Intravenous Q12H    Ht: 6\' 2"  (188 cm)  Wt: 270 lb 1 oz (122.5 kg)  Ideal Wt:  82.2 kg % Ideal Wt: 149%  Body mass index is 34.67 kg/(m^2).  Food/Nutrition Related Hx: unable to obtain/ obese  Labs:  CMP     Component Value Date/Time   NA 134* 01/22/2012 0420   K 5.2* 01/22/2012 0420   CL 104 01/22/2012 0420  CO2 23 01/22/2012 0420   GLUCOSE 138* 01/22/2012 0420   GLUCOSE 96 05/26/2006 1118   BUN 13 01/22/2012 0420   CREATININE 1.17 01/22/2012 0420   CALCIUM 8.7 01/22/2012 0420   PROT 4.6* 01/22/2012 0420   ALBUMIN 2.3* 01/22/2012 0420   AST 22 01/22/2012 0420   ALT 14 01/22/2012 0420   ALKPHOS 43 01/22/2012 0420   BILITOT 1.2 01/22/2012 0420   GFRNONAA 66* 01/22/2012 0420   GFRAA 77* 01/22/2012 0420     Intake:   Intake/Output Summary (Last 24 hours) at 01/22/12 0952 Last data filed at 01/22/12 0900  Gross per 24 hour  Intake  14782 ml  Output   6010 ml  Net  12242 ml   Output:   Diet Order: NPO  Supplements/Tube Feeding:  IVF:    sodium chloride Last Rate: 75 mL/hr at 01/22/12 0900  dexmedetomidine Last Rate: 0.7 mcg/kg/hr (01/22/12 0900)  phenylephrine (NEO-SYNEPHRINE) Adult infusion Last Rate: 45 mcg/min (01/22/12  0900)  DISCONTD: dexmedetomidine   DISCONTD: dexmedetomidine Last Rate: Stopped (01/21/12 1400)  DISCONTD: dextrose 5 % and 0.9 % NaCl with KCl 20 mEq/L Last Rate: Stopped (01/21/12 1700)  DISCONTD: dextrose 5 % and 0.45% NaCl Last Rate: 20 mL/hr (01/18/12 1539)  DISCONTD: DOPamine     Estimated Nutritional Needs:   Kcal: 1850-2050 Protein: 110-130 g Fluid: 2.2 L  NUTRITION DIAGNOSIS: -Inadequate oral intake (NI-2.1).  Status: Ongoing  RELATED TO: intubation  AS EVIDENCE BY: NPO status  MONITORING/EVALUATION(Goals): If ileus not resolved in 5- 7 days, consider initiation of TNA  EDUCATION NEEDS: -No education needs identified at this time    DOCUMENTATION CODES Per approved criteria  -Obesity Unspecified    Azaleah Usman,KATHY 01/22/2012, 9:48 AM

## 2012-01-22 NOTE — Progress Notes (Signed)
PCCM  Pt looks good.  Plan extubation this PM.  Dorcas Carrow Beeper  [phone]  Cell  [phone]  If no response or cell goes to voicemail, call beeper [phone]

## 2012-01-23 ENCOUNTER — Inpatient Hospital Stay (HOSPITAL_COMMUNITY): Payer: Managed Care, Other (non HMO)

## 2012-01-23 LAB — GLUCOSE, CAPILLARY
Glucose-Capillary: 104 mg/dL — ABNORMAL HIGH (ref 70–99)
Glucose-Capillary: 118 mg/dL — ABNORMAL HIGH (ref 70–99)
Glucose-Capillary: 99 mg/dL (ref 70–99)

## 2012-01-23 LAB — COMPREHENSIVE METABOLIC PANEL
AST: 21 U/L (ref 0–37)
Albumin: 2 g/dL — ABNORMAL LOW (ref 3.5–5.2)
BUN: 16 mg/dL (ref 6–23)
Calcium: 8.2 mg/dL — ABNORMAL LOW (ref 8.4–10.5)
Chloride: 105 mEq/L (ref 96–112)
Creatinine, Ser: 1.37 mg/dL — ABNORMAL HIGH (ref 0.50–1.35)
Total Protein: 4.7 g/dL — ABNORMAL LOW (ref 6.0–8.3)

## 2012-01-23 LAB — CBC
HCT: 25.5 % — ABNORMAL LOW (ref 39.0–52.0)
Hemoglobin: 8.6 g/dL — ABNORMAL LOW (ref 13.0–17.0)
MCH: 28.9 pg (ref 26.0–34.0)
MCV: 85.6 fL (ref 78.0–100.0)
RBC: 2.98 MIL/uL — ABNORMAL LOW (ref 4.22–5.81)
WBC: 7.3 10*3/uL (ref 4.0–10.5)

## 2012-01-23 NOTE — Progress Notes (Signed)
Name: Alfred [name] MRN: [id number] DOB: [birth year]    LOS: 5  Referring Provider:  Fabienne Bruns Reason for Referral:  Respiratory failure  PULMONARY / CRITICAL CARE MEDICINE  HPI:  61 y/o M with PMH of HTN, CAD s/p DES to RCA & CFX, AAA s/p repair through bilateral groin cutdown 09/06/11. Had continued left flank pain radiating to groin. 7/26 underwent CT ABD/Pelvis which demonstrated periaortic infection and 3 x 5.2 cm fluid collection at the left posterolateral aspect of the aortic endo graft. Followed by ID and treated for presumed endovascular graft infection (vancomycin, rifampin, ceftriaxone and metronidazole). Repeat CT on 8/5 showed progression of periaortic infection with increase in size 6.1 x 5.4 cm abscess. 8/6 underwent CT guided aspiration of 4ml of turbid fluid. Admitted 8/20 for continued fevers, planned graft removal. 8/23 underwent removal of infected Gore Excluder stent graft, aorto biiliac graft with cadaveric cryopreserved aorta. PCCM consulted for post op ICU / Vent mgmt. In OR received approximately 8 units PRBC's, 1 pk platlets, 2 FFP.    Brief patient description:  61 yo male s/p removal of infected AAA graft.  Required large volume transfusion intra-op.  Remained on vent, pressors post-op 8/23 and PCCM consulted.  Events Since Admission: 8/23 OR  Current Status: Extubated well PM 8/24.  C/o pain . Pt is on Dilaudid at home  Vital Signs: Temp:  [98.3 F (36.8 C)-100.1 F (37.8 C)] 100.1 F (37.8 C) (08/25 0400) Pulse Rate:  [78-143] 121  (08/25 0800) Resp:  [14-34] 22  (08/25 0800) BP: (91-145)/(49-87) 122/70 mmHg (08/25 0800) SpO2:  [96 %-100 %] 100 % (08/25 0800) Arterial Line BP: (84-144)/(44-108) 129/71 mmHg (08/24 1700) FiO2 (%):  [40 %] 40 % (08/24 1207) Weight:  [123.6 kg (272 lb 7.8 oz)] 123.6 kg (272 lb 7.8 oz) (08/25 0645)  Physical Examination: General:  Awake and alert   Neuro:  No focal deficits HEENT:Rt IJ in place.  Pupils  reactive. Neck:  supple Cardiovascular:  s1s2 regular, no murmur, extremities are cool LE Lungs:clear Abdomen:  Wound dressing clean, no bowel sounds Musculoskeletal:  No edema, extremities cool Skin:  Raised, skin colored rash on upper and lower extremities with sparing of trunk and face    ASSESSMENT AND PLAN  PULMONARY  Lab 01/22/12 0400 01/21/12 1710 01/21/12 1529 01/21/12 1427 01/21/12 1157  PHART 7.466* 7.404 7.310* 7.316* 7.455*  PCO2ART 33.0* 33.5* 43.5 44.3 39.8  PO2ART 112.0* 186.0* 409.0* 366.0* 415.0*  HCO3 23.8 21.5 22.6 23.4 28.5*  O2SAT 99.0 100.0 100.0 100.0 100.0   Ventilator Settings: Vent Mode:  [-] PRVC FiO2 (%):  [40 %] 40 % Set Rate:  [14 bmp] 14 bmp Vt Set:  [660 mL] 660 mL PEEP:  [5 cmH20] 5 cmH20 Pressure Support:  [5 cmH20] 5 cmH20 Plateau Pressure:  [17 cmH20] 17 cmH20  CXR: 8/25:  Improved aeration ETT:  8/23>>8/24  A:  Acute respiratory failure after removal of infected AAA graft.>>resolved P:   IS Oxygen titration  CARDIOVASCULAR  Lab 01/22/12 0412 01/21/12 2212 01/21/12 1612  TROPONINI <0.30 <0.30 <0.30  LATICACIDVEN -- -- --  PROBNP -- -- --    Lines:  Rt PICC 8/12>> Rt IJ cortise 8/23>> Lt IJ CVL 8/23>>  A: Hypovolemic, hemorrhagic, septic shock.  I>>O +13L>>>resolved P:  Reduce IVF    RENAL  Lab 01/23/12 0315 01/22/12 0420 01/21/12 1600 01/21/12 1529 01/21/12 1427 01/18/12 1250  NA 137 134* 136 135 134* --  K 4.0 5.2* -- -- -- --  CL 105 104 103 -- -- 95*  CO2 26 23 22  -- -- 30  BUN 16 13 10  -- -- 8  CREATININE 1.37* 1.17 0.87 -- -- 0.65  CALCIUM 8.2* 8.7 9.9 -- -- 8.8  MG -- 2.0 1.6 -- -- --  PHOS -- -- -- -- -- --   Intake/Output      08/24 0701 - 08/25 0700 08/25 0701 - 08/26 0700   I.V. (mL/kg) 2199.2 (17.8) 75 (0.6)   Blood     NG/GT 180 30   IV Piggyback 825    Total Intake(mL/kg) 3204.2 (25.9) 105 (0.8)   Urine (mL/kg/hr) 1365 (0.5) 100   Emesis/NG output 300    Blood     Total Output 1665 100    Net +1539.2 +5         Foley:  8/23>>8/25  A:  Oliguria slowly better, rising Cr with Acute renal insufficiency d/t hypoperfusion P:   Monitor renal fx, urine outpt, electrolytes Maintain hemodynamics Take out foley  GASTROINTESTINAL  Lab 01/23/12 0315 01/22/12 0420 01/18/12 1250  AST 21 22 15   ALT 13 14 11   ALKPHOS 56 43 68  BILITOT 1.4* 1.2 0.4  PROT 4.7* 4.6* 6.6  ALBUMIN 2.0* 2.3* 2.5*    A:  Nutrition. Postop ileus P:   NPO for now NG to LIS  HEMATOLOGIC  Lab 01/23/12 0315 01/22/12 0420 01/21/12 1800 01/21/12 1600 01/21/12 1529 01/21/12 1420 01/18/12 1250  HGB 8.6* 10.3* 11.1* 9.7* 8.2* -- --  HCT 25.5* 29.5* 32.2* 28.2* 24.0* -- --  PLT 138* 151 126* 149* -- -- 227  INR -- -- -- 1.56* -- 1.78* 1.16  APTT -- -- -- 39* -- 41* --   A:  Acute blood loss anemia during surgery. P:  F/u CBC, coags Transfuse as needed for bleeding and to maintain Hb > 7  INFECTIOUS  Lab 01/23/12 0315 01/22/12 0420 01/21/12 1800 01/21/12 1600 01/18/12 1250  WBC 7.3 7.3 8.8 11.9* 2.9*  PROCALCITON -- -- -- -- --   Cultures: 8/23 Abd abscess>>NGTD>>>  Antibiotics: 8/20 Vancomycin (graft abscess) >> 8/20 Meropenem(graft abscess)>> 8/20 Rifampin(graft abscess)>>  A:  Abdominal abscess with infected AAA graft. P:   Abx as above F/u wound cultures  ENDOCRINE  Lab 01/23/12 0348 01/22/12 2354 01/22/12 1950 01/22/12 1607 01/22/12 1234  GLUCAP 104* 98 126* 135* 130*   A:  Hyperglycemia. P:   SSI  NEUROLOGIC  A:  pain control. P:   Off precedex Pain control  DERMATOLOGY  A: Rash ?from betadine after surgery. P: PRN benadryl Try to avoid steroids  BEST PRACTICE / DISPOSITION Level of Care:  ICU Primary Service:  VVS Consultants:  PCCM Code Status:  Full Diet:  NPO DVT Px:  SCD  GI Px:  Protonix Skin Integrity:  Rash on extremities 8/23 Social / Family:  No family at bedside  Critical care time 35 minutes.  Shan Levans MD Beeper  [phone]  Cell   [phone]  If no response or cell goes to voicemail, call beeper [phone]  01/23/2012, 8:31 AM

## 2012-01-23 NOTE — Progress Notes (Signed)
PT Cancellation Note  Treatment cancelled today due to medical issues with patient which prohibited therapy. PT referral received.  Chart reviewed.  Noted medical condition and orders to discontinue PT. Will sign off. Please reorder when appropriate for patient.  Thank you.  Alfred, [name] 01/23/2012, 8:09 AM [phone]

## 2012-01-23 NOTE — Progress Notes (Signed)
Subjective: Interval History: none.Marland Kitchen Up in chair  Objective: Vital signs in last 24 hours: Temp:  [98.1 F (36.7 C)-100.1 F (37.8 C)] 98.1 F (36.7 C) (08/25 0835) Pulse Rate:  [78-143] 121  (08/25 0800) Resp:  [14-34] 22  (08/25 0800) BP: (91-145)/(49-87) 122/70 mmHg (08/25 0800) SpO2:  [96 %-100 %] 100 % (08/25 0800) Arterial Line BP: (84-144)/(44-108) 129/71 mmHg (08/24 1700) FiO2 (%):  [40 %] 40 % (08/24 1207) Weight:  [272 lb 7.8 oz (123.6 kg)] 272 lb 7.8 oz (123.6 kg) (08/25 0645)  Intake/Output from previous day: 08/24 0701 - 08/25 0700 In: 3204.2 [I.V.:2199.2; NG/GT:180; IV Piggyback:825] Out: 1665 [Urine:1365; Emesis/NG output:300] Intake/Output this shift: Total I/O In: 105 [I.V.:75; NG/GT:30] Out: 100 [Urine:100]  Mod abd soreness. Wd healing well.  Feet well perfused  Lab Results:  Basename 01/23/12 0315 01/22/12 0420  WBC 7.3 7.3  HGB 8.6* 10.3*  HCT 25.5* 29.5*  PLT 138* 151   BMET  Basename 01/23/12 0315 01/22/12 0420  NA 137 134*  K 4.0 5.2*  CL 105 104  CO2 26 23  GLUCOSE 120* 138*  BUN 16 13  CREATININE 1.37* 1.17  CALCIUM 8.2* 8.7    Studies/Results: Ct Abdomen Pelvis W Contrast  12/24/2011  *RADIOLOGY REPORT*  Clinical Data:  Lower abdominal pain for several weeks with associated intermittent fevers not responsive to Vicodin, superolateral Flagyl.  History of EVAR for AAA on 09/06/2011  CT ABDOMEN AND PELVIS WITH CONTRAST  Technique:  Multidetector CT imaging of the abdomen and pelvis was performed following the standard protocol during bolus administration of intravenous contrast.  Contrast: OMNIPAQUE IOHEXOL 300 MG/ML  SOLN  Comparison: CT abdomen and pelvis 10/07/2011, and pretreatment CT scan 08/19/2011.  Findings:  VASCULAR  Aorta: Interval development of a 3.3 x 2.0 cm ring enhancing fluid collection at the left posterolateral aspect of the of the excluded infrarenal abdominal aortic aneurysm sac.  The fluid collection is located at  the level of the endoprosthesis flow divider. There is associated stranding of the retroperitoneal periaortic fat extending inferiorly along the iliac vessels to the pelvis. The Gore Excluder endograft is in unchanged position.  No crimping, or thrombosis.  The aneurysm sac itself is unchanged in size at 5.6 x 5.6 cm in greatest transverse dimension.  Additionally, there is no enhancement of the excluded aneurysm sac on the portal venous, or delayed phase images confirming that there is no endoleak.  Celiac: Patent, unremarkable  SMA: Patent, unremarkable  Renals: Single renal arteries bilaterally.  The renal arteries are widely patent.  Next flow next flow minimal atherosclerotic vascular disease, no evidence of focal stenosis, or aneurysmal dilatation.  Hypogastric arteries remain patent bilaterally. Interventional radiology  IMA: Excluded by the Endograft.  Distal branches fill via retrograde flow.  Inflow: The trace atherosclerotic vascular disease.  Bilateral hypogastric arteries remain patent.  Surgical changes of bilateral groins consistent with bilateral surgical cut downs.  Veins: Limited evaluation of the veins given non venous timing.  No gross abnormality.  NON-VASCULAR  Lower Chest: Normal sized heart.  Atherosclerotic vascular calcification noted in the left anterior descending, circumflex and right coronary arteries.  No pericardial effusion.  Lung bases are clear.  Abdomen: Diffuse hypoattenuation of the liver consistent with steatosis.  The spleen, pancreas, adrenal glands, and kidneys are unremarkable.  Scattered colonic diverticular disease without active inflammation to suggest acute diverticulitis.  No free fluid in the abdomen, or pelvis. No suspicious adenopathy.  Pelvis: Normal bladder, prostate and seminal  vesicles.  No free fluid in the pelvis.  No suspicious pelvic adenopathy.  Bones: No acute fracture or aggressive appearing lytic or blastic osseous lesion.  IMPRESSION:  1.  CT images are  concerning for periaortic infection with possible involvement of the aortic endoprosthesis.  Interval development of a 3 x 5.2 cm rim enhancing fluid collection at the left posterolateral aspect of the aortic endograft with associated inflammatory stranding of the a periaortic retroperitoneal fat. Mild inflammatory stranding continues inferiorly along the course of the iliac vessels into the pelvis.  The finding is located at the L3-L4 interspace and begins at the region of the flow divider of the endovascular graft.  This fluid collection is separate from the excluded aneurysm sac.  Recommend clinical correlation with serum CRP and ESR. Additionally, a tagged white blood cell study may be useful to confirm the presence of infection associated with the endograft and periaortic space.  2. The aneurysm sac itself is intact, and unchanged in size compared to prior.  There is no evidence of endoleak on delayed imaging.  Critical Value/emergent results were called by telephone at the time of interpretation on 12/24/2011 at 1530 to Dr. Marga Melnick, and also to Dr. Fabienne Bruns at approximately 1600., who verbally acknowledged these results.  Signed,  Sterling Big, MD Vascular & Interventional Radiologist [facility]  Original Report Authenticated By: Threasa Beards Angio Ao+bifem W/cm &/or Wo/cm  01/18/2012  *RADIOLOGY REPORT*  Clinical Data:  61 year old male with suspected abdominal aortic stent graft infection  CT ANGIOGRAPHY OF ABDOMINAL AORTA WITH ILIOFEMORAL RUNOFF  Technique:  Multidetector CT imaging of the abdomen, pelvis and lower extremities was performed using the standard protocol during bolus administration of intravenous contrast.  Multiplanar CT image reconstructions including MIPs were obtained to evaluate the vascular anatomy.  Contrast: OMNIPAQUE IOHEXOL 350 MG/ML SOLN  Comparison:   Prior CTA 01/09/2012 and 01/03/2012  Findings:  VASCULAR  Aorta: Infrarenal abdominal aortic  aneurysm status post EVAR with a bifurcated Gore endoprosthesis.  The aneurysm sac is effectively excluded, there is no evidence of endoleak.  The aneurysm sac is unchanged in size with a maximal diameter of 5.1 cm.  The size of the periaortic phlegmon and gas collection has decreased compared to the prior study from 01/09/2012.  The collection measures approximately 3.3 x 3.9 cm compared to 5.8 x 4.2 cm.  The locules involving the psoas muscle are less conspicuous on today's examination.  However, there is persistent irregular soft tissue density and inflammatory stranding encircling the excluded aneurysm sac and extending inferiorly to the bifurcation, and then along the left common iliac artery and proximal external and internal iliac arteries.  Celiac: Widely patent.  Conventional hepatic arterial anatomy.  SMA: Widely patent  Renals: Single renal arteries bilaterally.  Both are widely patent.  IMA: Excluded by the Endograft, but fills via retrograde flow.  The proximal most portion of the IMA is surrounded by inflammatory change around the excluded aneurysm sac.  No definite pseudoaneurysm, or other complicating feature identified.  Inflow: The iliac limbs of the Gore endoprosthesis extend into the common iliac arteries bilaterally. No evidence of limb occlusion. The bilateral external and internal iliac arteries are widely patent with only trace atherosclerotic vascular calcifications.  As discussed above, inflammatory stranding extends along the left common iliac artery and the proximal aspects of the left external and internal iliac arteries.  Mild calcified atherosclerotic vascular calcification noted along the posterior walls of the bilateral common femoral  arteries.  Outflow: The bilateral superficial femoral and profunda femoral arteries are widely patent.  Only trace atherosclerotic vascular disease.  No focal stenosis.  Mild atherosclerotic vascular disease noted in the bilateral popliteal arteries  without focal stenosis.  Runoff: Three-vessel runoff on the right.  The anterior tibial, posterior tibial and peroneal arteries are patent to the ankle.  On the left, there is two-vessel runoff to the ankle.  The anterior tibial artery and peroneal arteries provide the runoff.  The peroneal artery fills the posterior tibial artery at the ankle via collateral flow.  The posterior tibial artery appears atretic throughout its course.  Veins: Given the limitations of non venous phase imaging, no focal abnormality.  NON-VASCULAR  Lower Chest: Linear atelectasis in the inferior lingula and left greater than right lower lobes.  Otherwise, the visualized lower thorax is unremarkable.  Abdomen: Diffuse low attenuation throughout the hepatic parenchyma consistent with steatosis.  The spleen, pancreas, adrenal glands and kidneys are unremarkable.  Scattered colonic diverticular disease without active inflammation.  Periaortic inflammatory changes, and a phlegmon as detailed above under the vascular section.  There are small scattered left periaortic and retroperitoneal lymph nodes which are likely reactive.  Pelvis: Normal bladder, prostate and seminal vesicles.  Bones: No acute fracture or aggressive appearing lytic or blastic osseous lesion.   Review of the MIP images confirms the above findings.  IMPRESSION:  1.  Persistent, but improving periaortic inflammatory phlegmon. The main collection measures 3.9 x 3.3 cm today compared to 5.8 x 4.2 cm on 01/09/2012.  The locules within the left psoas muscle are significantly less conspicuous on today's examination.  The amount of a shaggy inflammatory stranding encircling the excluded aneurysm sac and extending along the iliac vessels into the upper pelvis appears unchanged.  2.  Infrarenal abdominal aortic aneurysm status post endovascular repair with a Gore bifurcated endoprosthesis.  The excluded aneurysm sac is unchanged in size with a maximal dimension of 5.1 cm.  No evidence  of endoleak.  3.  No significant inflow, outflow or runoff atherosclerotic vascular disease.  There is three-vessel runoff to the right ankle, and two-vessel runoff left.  Signed,  Sterling Big, MD Vascular & Interventional Radiologist [facility]   Original Report Authenticated By: Threasa Beards Aspiration  01/04/2012  *RADIOLOGY REPORT*  Clinical Data: Enlarging left retroperitoneal collection concerning for abscess and adjacent to the aorta and an indwelling aortic endograft.  The patient presents for aspiration of the collection to try to establish a diagnosis prior to possible surgery.  CT GUIDED ASPIRATION OF LEFT RETROPERITONEAL ABSCESS  Sedation:  3.0 mg IV Versed;  125 mcg IV Fentanyl  Total Moderate Sedation Time: 20 minutes.  Comparison:  CTA of the abdomen and pelvis performed yesterday.  Procedure:  The procedure, risks, benefits, and alternatives were explained to the patient.  Questions regarding the procedure were encouraged and answered. The patient understands and consents to the procedure.  The left translumbar region was prepped with Betadine in a sterile fashion, and a sterile drape was applied covering the operative field.  A sterile gown and sterile gloves were used for the procedure. Local anesthesia was provided with 1% Lidocaine.  CT was performed in a prone position.  Under CT guidance, an 18 gauge trocar needle was advanced into the left retroperitoneum. Aspiration was performed.  Lavage was then performed with approximately 4 ml of sterile saline.  Procedure a guidewire was also advanced out of the needle.  Complications: None  Findings: Left retroperitoneal inflammatory process and complex fluid collection was localized.  The trocar needle was advanced through the enlarged psoas muscle to an area suspected to be more liquefied by CT yesterday.  Aspiration initially did not yield any free fluid.  After guide wire advancement and saline lavage, ultimately, approximately 4  ml of turbid fluid was removed and sent for culture analysis.  IMPRESSION: CT guided aspiration of the left retroperitoneal process did not initially yield a significant amount of free fluid.  Ultimately, 4 ml of turbid fluid suspicious for developing abscess was able to be removed after guide wire advancement and sterile saline lavage. This fluid sample was sent for culture analysis.  Original Report Authenticated By: Reola Calkins, M.D.   Nm Wbc Scan Tumor  01/20/2012  *RADIOLOGY REPORT*  Clinical Data: Infected aortic graft.  NUCLEAR MEDICINE LOCALIZATION OF TUMOR LIMITED  Technique:  After intravenous injection of radiopharmaceutical, standard planar projections were obtained on subsequent days through the multiple area of interest.  Radiopharmaceutical: CURIE DTPA INDIUM-111 PENTETATE, CURIE TC-CERETEC TECHNETIUM TC 90M EXAMETAZIME IV KIT  Comparison: CT scan 01/18/2012.  Findings: There is normal expected uptake in the liver and spleen. Mild bone marrow activity is also normal.  There is abnormal uptake in the left lower paraspinal region which correlates with the infected aortic graft and left psoas involvement.  No other areas of abnormal uptake are identified.  IMPRESSION: Abnormal uptake of the labeled white blood cells in the region of the patient's infected aortic graft and left psoas abscess.   Original Report Authenticated By: P. Loralie Champagne, M.D.    Dg Chest Port 1 View  01/23/2012  *RADIOLOGY REPORT*  Clinical Data: 61 year old male - status post infected abdominal aortic stent graft removal and abdominal aortic repair.  PORTABLE CHEST - 1 VIEW  Comparison: 01/22/2012 and prior chest radiographs  Findings: An endotracheal tube has been removed. A right IJ central venous catheter sheath, left IJ central venous catheter with tip overlying the upper SVC / azygos vein, and NG tube entering the stomach with tip off the field of view again noted. This is a low-volume film with  bibasilar atelectasis, greater than right. There is no evidence of pneumothorax or large pleural effusion. Minimal vascular congestion noted.  IMPRESSION: Endotracheal tube removal with slight increase in bibasilar atelectasis. Support apparatus otherwise unchanged.   Original Report Authenticated By: Rosendo Gros, M.D.    Dg Chest Portable 1 View  01/22/2012  *RADIOLOGY REPORT*  Clinical Data: Postop aortic stent graft  PORTABLE CHEST - 1 VIEW  Comparison: 01/21/2012  Findings: Right IJ catheter sheath is noted in the projection of the proximal SVC.  There is a left IJ catheter with tip in the SVC. ET tube tip is above the carina.  Nasogastric tube tip is in the stomach.  Heart size appears normal.  There are low lung volumes. Mild bibasilar atelectasis identified.  IMPRESSION:  1.  Stable support device. 2.  Low lung volumes and mild bibasilar atelectasis.   Original Report Authenticated By: Rosealee Albee, M.D.    Dg Chest Port 1 View  01/21/2012  *RADIOLOGY REPORT*  Clinical Data: Left IJ catheter placement.  PORTABLE CHEST - 1 VIEW  Comparison: Earlier film, same date.  Findings: The left IJ catheter tip is in the mid SVC.  It is against the lateral wall of the SVC.  The other support apparatus is stable.  Persistent low lung volumes with vascular crowding and atelectasis.  IMPRESSION: Left IJ catheter tip is against the lateral wall of the mid SVC.   Original Report Authenticated By: P. Loralie Champagne, M.D.    Dg Chest Portable 1 View  01/21/2012  *RADIOLOGY REPORT*  Clinical Data: For central line placement  PORTABLE CHEST - 1 VIEW  Comparison: Portable chest x-ray of 09/06/2011  Findings: The tip of the endotracheal tube is approximately 2.9 cm above the carina.  A right upper extremity PICC line is present with the tip overlying the region of the lower SVC.  A right IJ sheath is again noted overlying the lower right internal jugular vein.  No pneumothorax is seen.  The lungs are clear.  Mild  cardiomegaly is stable.  IMPRESSION:  1.  Right PICC line tip overlies the region of the lower SVC. 2.  Venous sheath overlies the region of the lower right internal jugular vein. 3.  Endotracheal tip 2.9 cm above carina.   Original Report Authenticated By: Juline Patch, M.D.    Ct Angio Abd/pel W/ And/or W/o  01/09/2012  *RADIOLOGY REPORT*  Clinical Data:  Indwelling aorto bi-iliac stent graft for abdominal aortic aneurysm repair, with current history of perigraft infection for which the patient is currently on antibiotic therapy.  CT ANGIOGRAPHY ABDOMEN AND PELVIS  Technique:  Multidetector CT imaging of the abdomen and pelvis was performed using the standard protocol during bolus administration of intravenous contrast.  Multiplanar reconstructed images including MIPs were obtained and reviewed to evaluate the vascular anatomy.  Contrast: OMNIPAQUE IOHEXOL 350 MG/ML.  Comparison:  CTA abdomen and pelvis 01/03/2012, 10/07/2011, 08/19/2011, and CT abdomen and pelvis 12/24/2011.  Findings:  No significant change in the size of the multiloculated abscess in the left side of the retroperitoneum, adjacent to the native abdominal aorta, maximum measurements currently approximating 8.2 x 5.7 x 8.3 cm (series 6, image 40 and series 606, image 48).  Stent graft widely patent.  No evidence of endoleak.  Single renal arteries widely patent.  Widely patent celiac and SMA.  Retrograde filling of the IMA.  No abscess elsewhere in the abdomen or pelvis.  Mild edema/inflammation in the retroperitoneum of the left upper pelvis, adjacent to the external iliac vessels, unchanged.  Diffuse hepatic steatosis without focal hepatic parenchymal abnormality.  Normal-appearing spleen, adrenal glands, and kidneys. Mild pancreatic atrophy with fatty replacement; no focal pancreatic parenchymal abnormality.  Gallbladder unremarkable by CT.  No biliary ductal dilation. No significant lymphadenopathy.  Stomach and small bowel normal in  appearance.  Scattered sigmoid colon diverticula without evidence of acute diverticulitis; remainder of the colon unremarkable.  No ascites.  Urinary bladder decompressed and unremarkable.  Prostate gland and seminal vesicles normal for age.  Bone window images demonstrate ankylosis of the sacroiliac joints, degenerative changes involving the lumbar spine, and lumbar scoliosis convex left.  Visualized lung bases clear apart from minimal atelectasis in the deep left lower lobe.   Review of the MIP images confirms the above findings.  IMPRESSION:  1.  No significant change in the multiloculated abscess in the left retroperitoneum, associated with the native abdominal aorta, measured above. 2.  Patent aorto bi-iliac stent graft.  No evidence of endoleak.  3.  Stable mild edema/inflammation in the left upper pelvis adjacent to the left external iliac vessels, without abscess. 4.  Diffuse hepatic steatosis as noted previously. 5.  Mild pancreatic atrophy as noted previously.  Original Report Authenticated By: Arnell Sieving, M.D.   Ct Angio Abd/pel W/ And/or W/o  01/03/2012  *  RADIOLOGY REPORT*  Clinical Data:  Continuous left lower back and hip pain. Post EVAR (08/2011).  CT ANGIOGRAPHY ABDOMEN AND PELVIS  Technique:  Multidetector CT imaging of the abdomen and pelvis was performed using the standard protocol during bolus administration of intravenous contrast.  Multiplanar reconstructed images including MIPs were obtained and reviewed to evaluate the vascular anatomy.  Contrast: OMNIPAQUE IOHEXOL 350 MG/ML SOLN  Comparison:  CT abdomen pelvis - 12/24/2011; 10/07/2011  Arterial findings: Aorta:                  Post endovascular repair of infrarenal abdominal aortic aneurysm.  Review of the precontrast images are negative for intramural hematoma.  The caliber of the now excluded native infrarenal abdominal aorta is grossly unchanged measuring 5.3 x 5.2 cm in greatest transverse axial dimension (image 99,  series 4). No definite evidence of endoleak.  Interval increase in the size of the irregular multi septated peripherally enhancing fluid collection about the left lateral posterior aspect of the native abdominal aorta with the collection now extending into the left iliopsoas musculature. The collection now measures approximately 6.1 x 5.4 cm in greatest transverse axial dimension (image 229, series four), and approximately 9 cm in length (image 69, series 601). The cranial aspect of the abscess appears to abut the right iliac gait (image 226, series four). There is no definite evidence of osteolysis within the adjacent lumbar spine to suggest osteomyelitis. Stranding extends into the left hemipelvis.  Celiac axis:            Widely patent, possible branching pattern.  Superior mesenteric:Widely patent, possible branching pattern.  Left renal:             Solitary:  Widely patent  Right renal:            A solitary:  Widely patent  Inferior mesenteric:Appears patent at its origin, however again, there is no definitive evidence of Endo leak on this examination.  Iliac vessels:          The distal ends of the grafts appear well seated within their respective common iliac arteries.  The bilateral common iliac arteries are widely patent.  The bilateral external iliac arteries are tortuous but widely patent.   Review of the MIP images confirms the above findings.  Nonvascular findings:  Normal hepatic contour.  No discrete hepatic lesions.  Normal gallbladder.  No definite intra or extrahepatic biliary duct dilatation.  No ascites.  There is symmetric enhancement of the bilateral kidneys.  No definite renal stones.  There is grossly symmetric bilateral perinephric stranding.  No urinary obstruction.  Normal appearance of the bilateral adrenal glands, pancreas and spleen.  Scattered colonic diverticulosis without evidence of diverticulitis.  The bowel is otherwise normal in course and caliber without wall thickening or  evidence of obstruction.  The appendix is not visualized, with the prior history of prior appendectomy.  No pneumoperitoneum, pneumatosis or portal venous gas.  No definite retroperitoneal, mesenteric, pelvic or inguinal lymphadenopathy.  Incidental note is made of grossly unchanged small indirect mesenteric fat containing inguinal hernias.  Limited visualization of the lower thorax demonstrates minimal dependent linear ground-glass opacities, left greater than right, favored to represent subsegmental atelectasis.  No pleural effusion.  Normal heart size.  No pericardial effusion.  No acute or aggressive osseous abnormalities.  Specifically, there is no definitive CT evidence of diskitis/osteomyelitis.  IMPRESSION: 1.  Findings compatible with progression of periaortic infection with possible involvement/seeding of the EVAR prosthesis. Interval  increase in size of the now approximately 6.1 x 5.4 cm peripherally enhancing abscess adjacent to the left posterior lateral aspect of the excluded native abdominal aorta, now with invasion of the iliopsoas musculature.  Note, the cranial aspect of the abscess appears to abut the endograft.  2. Unchanged caliber of the excluded native abdominal aortic aneurysm.  No evidence of endoleak.  Above findings discussed with Dr. Myra Gianotti at [phone].  Original Report Authenticated By: Waynard Reeds, M.D.   Anti-infectives: Anti-infectives     Start     Dose/Rate Route Frequency Ordered Stop   01/21/12 2200   rifampin (RIFADIN) 600 mg in sodium chloride 0.9 % 100 mL IVPB        600 mg 200 mL/hr over 30 Minutes Intravenous Every 24 hours 01/21/12 1702     01/21/12 1630   cefUROXime (ZINACEF) 1.5 g in dextrose 5 % 50 mL IVPB  Status:  Discontinued        1.5 g 100 mL/hr over 30 Minutes Intravenous Every 12 hours 01/21/12 1619 01/21/12 1645   01/18/12 2200   rifampin (RIFADIN) capsule 300 mg  Status:  Discontinued        300 mg Oral Every 12 hours 01/18/12 1356 01/21/12 1619     01/18/12 1600   meropenem (MERREM) 1 g in sodium chloride 0.9 % 100 mL IVPB  Status:  Discontinued        1 g 200 mL/hr over 30 Minutes Intravenous Every 8 hours 01/18/12 1518 01/18/12 1537   01/18/12 1600   meropenem (MERREM) 2 g in sodium chloride 0.9 % 100 mL IVPB        2 g 200 mL/hr over 30 Minutes Intravenous Every 8 hours 01/18/12 1547     01/18/12 1500   vancomycin (VANCOCIN) IVPB 1000 mg/200 mL premix  Status:  Discontinued        1,000 mg 200 mL/hr over 60 Minutes Intravenous Every 8 hours 01/18/12 1351 01/18/12 1447   01/18/12 1500   meropenem (MERREM) 2 g in sodium chloride 0.9 % 100 mL IVPB  Status:  Discontinued        2 g 200 mL/hr over 30 Minutes Intravenous Every 8 hours 01/18/12 1354 01/18/12 1517   01/18/12 1500   vancomycin (VANCOCIN) 1,250 mg in sodium chloride 0.9 % 250 mL IVPB        1,250 mg 166.7 mL/hr over 90 Minutes Intravenous Every 8 hours 01/18/12 1448     01/18/12 1400   meropenem (MERREM) 2 g in sodium chloride 0.9 % 100 mL IVPB  Status:  Discontinued        2 g 200 mL/hr over 30 Minutes Intravenous 3 times per day 01/18/12 1152 01/18/12 1354   01/18/12 1300   rifampin (RIFADIN) capsule 300 mg  Status:  Discontinued        300 mg Oral 2 times daily 01/18/12 1152 01/18/12 1356   01/18/12 1200   vancomycin (VANCOCIN) IVPB 1000 mg/200 mL premix  Status:  Discontinued        1,000 mg 200 mL/hr over 60 Minutes Intravenous Every 8 hours 01/18/12 1152 01/18/12 1351          Assessment/Plan: s/p Procedure(s) (LRB): REMOVAL OF ABDOMINAL AORTIC STENT GRAFT (N/A) ANEURYSM ABDOMINAL AORTIC REPAIR (N/A) Stable pod 2.  Pain controlled with PCA.  Keep in SICU today.  Dc  ng, foley and Swan sleeve   LOS: 5 days   Alfred [name] 01/23/2012, 9:01 AM

## 2012-01-24 ENCOUNTER — Other Ambulatory Visit: Payer: Self-pay | Admitting: Cardiovascular Disease

## 2012-01-24 ENCOUNTER — Inpatient Hospital Stay (HOSPITAL_COMMUNITY): Payer: Managed Care, Other (non HMO)

## 2012-01-24 DIAGNOSIS — T827XXA Infection and inflammatory reaction due to other cardiac and vascular devices, implants and grafts, initial encounter: Secondary | ICD-10-CM

## 2012-01-24 DIAGNOSIS — Y832 Surgical operation with anastomosis, bypass or graft as the cause of abnormal reaction of the patient, or of later complication, without mention of misadventure at the time of the procedure: Secondary | ICD-10-CM

## 2012-01-24 LAB — CBC
HCT: 23 % — ABNORMAL LOW (ref 39.0–52.0)
MCH: 29.2 pg (ref 26.0–34.0)
MCHC: 33 g/dL (ref 30.0–36.0)
MCV: 88.5 fL (ref 78.0–100.0)
Platelets: 135 10*3/uL — ABNORMAL LOW (ref 150–400)
RDW: 15.7 % — ABNORMAL HIGH (ref 11.5–15.5)
WBC: 5 10*3/uL (ref 4.0–10.5)

## 2012-01-24 LAB — TISSUE CULTURE: Culture: NO GROWTH

## 2012-01-24 LAB — CATH TIP CULTURE

## 2012-01-24 LAB — WOUND CULTURE
Culture: NO GROWTH
Culture: NO GROWTH
Gram Stain: NONE SEEN

## 2012-01-24 LAB — BASIC METABOLIC PANEL
BUN: 17 mg/dL (ref 6–23)
CO2: 27 mEq/L (ref 19–32)
Calcium: 8.4 mg/dL (ref 8.4–10.5)
Creatinine, Ser: 1.35 mg/dL (ref 0.50–1.35)

## 2012-01-24 MED ORDER — HYDROMORPHONE HCL 2 MG PO TABS
2.0000 mg | ORAL_TABLET | Freq: Four times a day (QID) | ORAL | Status: DC | PRN
  Administered 2012-01-25 – 2012-01-26 (×4): 2 mg via ORAL
  Filled 2012-01-24 (×4): qty 1

## 2012-01-24 MED ORDER — PRO-STAT SUGAR FREE PO LIQD
30.0000 mL | Freq: Three times a day (TID) | ORAL | Status: DC
  Administered 2012-01-24 – 2012-01-25 (×2): 30 mL via ORAL
  Filled 2012-01-24 (×9): qty 30

## 2012-01-24 MED ORDER — FENTANYL 50 MCG/HR TD PT72
50.0000 ug | MEDICATED_PATCH | TRANSDERMAL | Status: DC
  Administered 2012-01-24 – 2012-01-27 (×2): 50 ug via TRANSDERMAL
  Filled 2012-01-24 (×2): qty 1

## 2012-01-24 MED ORDER — PANTOPRAZOLE SODIUM 40 MG PO TBEC
40.0000 mg | DELAYED_RELEASE_TABLET | Freq: Every day | ORAL | Status: DC
  Administered 2012-01-24 – 2012-01-28 (×5): 40 mg via ORAL
  Filled 2012-01-24 (×5): qty 1

## 2012-01-24 MED ORDER — BOOST / RESOURCE BREEZE PO LIQD
1.0000 | Freq: Three times a day (TID) | ORAL | Status: DC
  Administered 2012-01-24 (×2): 1 via ORAL

## 2012-01-24 NOTE — Progress Notes (Signed)
Transferred to 2011 via wheelchair with monitor and O2 at 1L [place]. Tolerated well. Park and walk completed.

## 2012-01-24 NOTE — Plan of Care (Signed)
Problem: Phase II Progression Outcomes Goal: Date transferred to telemetry Outcome: Completed/Met Date Met:  01/24/12 01/24/12

## 2012-01-24 NOTE — Evaluation (Signed)
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  Expect pt will make good progress and will be able to return home with wife.      PT Assessment  Patient needs continued PT services    Follow Up Recommendations  No PT follow up    Barriers to Discharge        Equipment Recommendations  None recommended by PT    Recommendations for Other Services     Frequency Min 3X/week    Precautions / Restrictions     Pertinent Vitals/Pain Abd pain 6/10. Pt using pca.      Mobility  Bed Mobility Bed Mobility: Sit to Supine Sit to Supine: 4: Min assist Details for Bed Mobility Assistance: to bring feet up Transfers Transfers: Sit to Stand;Stand to Sit Sit to Stand: 4: Min assist;With upper extremity assist;From bed;From elevated surface Stand to Sit: 4: Min assist;To bed;With armrests Details for Transfer Assistance: verbal cues to push from bed instead of pull up on walker Ambulation/Gait Ambulation/Gait Assistance: 4: Min guard Ambulation Distance (Feet): 300 Feet Assistive device: Rolling walker Ambulation/Gait Assistance Details: verbal cues to stand more erect Gait Pattern: Step-through pattern;Decreased stride length    Exercises     PT Diagnosis: Difficulty walking;Generalized weakness  PT Problem List: Decreased strength;Decreased activity tolerance;Decreased mobility PT Treatment Interventions: DME instruction;Gait training;Stair training;Functional mobility training;Therapeutic exercise;Therapeutic activities;Patient/family education   PT Goals Acute Rehab PT Goals PT Goal Formulation: With patient Time For Goal Achievement: 01/31/12 Potential to Achieve Goals: Good Pt will go Supine/Side to Sit: with modified independence PT Goal:  Supine/Side to Sit - Progress: Goal set today Pt will go Sit to Supine/Side: with modified independence PT Goal: Sit to Supine/Side - Progress: Goal set today Pt will go Sit to Stand: with modified independence PT Goal: Sit to Stand - Progress: Goal set today Pt will go Stand to Sit: with modified independence PT Goal: Stand to Sit - Progress: Goal set today Pt will Ambulate: >150 feet;with modified independence;with least restrictive assistive device PT Goal: Ambulate - Progress: Goal set today Pt will Go Up / Down Stairs: 6-9 stairs;with supervision;with rail(s) PT Goal: Up/Down Stairs - Progress: Goal set today  Visit Information  Last PT Received On: 01/24/12 Assistance Needed: +1    Subjective Data  Subjective: 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 Patient Stated Goal: Return to prior level of function   Prior Functioning  Home Living Lives With: Spouse Available Help at Discharge: Family;Friend(s) Type of Home: House Home Layout: Two level;Bed/bath upstairs Bathroom Shower/Tub: Tub/shower unit;Walk-in shower Home Adaptive Equipment: Straight cane Prior Function Level of Independence: Independent Able to Take Stairs?: Yes Driving: Yes 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 Communication Communication: No difficulties    Cognition  Overall Cognitive Status: Appears within functional limits for tasks assessed/performed Arousal/Alertness: Awake/alert Orientation Level: Appears intact for tasks assessed Behavior During Session: [facility] for tasks performed    Extremity/Trunk Assessment Right Lower Extremity Assessment RLE ROM/Strength/Tone: Deficits RLE ROM/Strength/Tone Deficits: grossly 4/5 Left Lower Extremity Assessment LLE ROM/Strength/Tone: Deficits LLE ROM/Strength/Tone Deficits: grossly 4/5   Balance Static Standing Balance Static Standing - Balance Support: No upper extremity supported Static Standing - Level of Assistance: 5: Stand by  assistance  End of Session PT - End of Session Activity  Tolerance: Patient tolerated treatment well Patient left: in bed;with call bell/phone within reach Nurse Communication: Mobility status  GP     [facility] 01/24/2012, 12:00 PM  [facility] [place] PT [phone]

## 2012-01-24 NOTE — Progress Notes (Signed)
ANTIBIOTIC CONSULT NOTE - FOLLOW UP  Pharmacy Consult for Vancomycin/Merrem/Rifampin Indication: Abdominal abscess with infected AAA graft   No Known Allergies  Patient Measurements: Height: 6\' 2"  (188 cm) Weight: 271 lb 6.2 oz (123.1 kg) IBW/kg (Calculated) : 82.2   Vital Signs: Temp: 97.7 F (36.5 C) (08/26 1154) Temp src: Oral (08/26 1154) BP: 126/74 mmHg (08/26 1245) Pulse Rate: 98  (08/26 1245) Intake/Output from previous day: 08/25 0701 - 08/26 0700 In: 2665.5 [I.V.:1468.5; NG/GT:30; IV Piggyback:1167] Out: 1675 [Urine:1675] Intake/Output from this shift: Total I/O In: 622.5 [P.O.:280; I.V.:342.5] Out: 325 [Urine:325]  Labs:  [facility] 01/24/12 0408 01/23/12 0315 01/22/12 0420  WBC 5.0 7.3 7.3  HGB 7.6* 8.6* 10.3*  PLT 135* 138* 151  LABCREA -- -- --  CREATININE 1.35 1.37* 1.17   Estimated Creatinine Clearance: 81.2 ml/min (by C-G formula based on Cr of 1.35).   Assessment:  61 y/o male patient receiving vanc/merrem/rifampin for graft infection. Currently afebrile, scr trend up and patient has become oliguric. Obtained vanc trough today =40.6 , which is above goal. Drug accumulation d/t poor renal output.     Goal of Therapy:  Vancomycin trough level 15-20 mcg/ml appropriate Meropenem and Rifampin doses for renal function & infection  Plan:   Will hold vancomycin and check random level in am. May need to hold dose a couple days. Check bmet in am. Other abx doses appropriate.  Verlene Mayer, PharmD, BCPS Pager [phone] 01/24/2012,1:51 PM

## 2012-01-24 NOTE — Anesthesia Postprocedure Evaluation (Signed)
  Anesthesia Post-op Note  Patient: Alfred [name]  Procedure(s) Performed: Procedure(s) (LRB): REMOVAL OF ABDOMINAL AORTIC STENT GRAFT (N/A) ANEURYSM ABDOMINAL AORTIC REPAIR (N/A)  Patient Location: PACU  Anesthesia Type: General  Level of Consciousness: Patient remains intubated per anesthesia plan  Airway and Oxygen Therapy: Patient remains intubated per anesthesia plan and Patient placed on Ventilator (see vital sign flow sheet for setting)  Post-op Pain: none  Post-op Assessment: Post-op Vital signs reviewed, Patient's Cardiovascular Status Stable, Respiratory Function Stable, Patent Airway, No signs of Nausea or vomiting and Pain level controlled  Post-op Vital Signs: stable  Complications: No apparent anesthesia complications

## 2012-01-24 NOTE — Progress Notes (Addendum)
[facility] [place]  Post-op  Intra-abdominal Surgery note  Date of Surgery: 01/18/2012 - 01/21/2012 Surgeon: Moishe Spice): Sherren Kerns, MD Chuck Hint, MD POD: 3 Days Post-Op Procedure(s): REMOVAL OF ABDOMINAL AORTIC STENT GRAFT ANEURYSM ABDOMINAL AORTIC REPAIR  History of Present Illness  Alfred [name] is a 61 y.o. male who is  up s/p Procedure(s): REMOVAL OF ABDOMINAL AORTIC STENT GRAFT ANEURYSM ABDOMINAL AORTIC REPAIR Pt is doing well. complains of mod. incisional pain;using less PCA today,  denies nausea/vomiting; denies diarrhea. has had flatus;has not had BM  IMAGING: Dg Chest Port 1 View  01/23/2012  *RADIOLOGY REPORT*  Clinical Data: 61 year old male - status post infected abdominal aortic stent graft removal and abdominal aortic repair.  PORTABLE CHEST - 1 VIEW  Comparison: 01/22/2012 and prior chest radiographs  Findings: An endotracheal tube has been removed. A right IJ central venous catheter sheath, left IJ central venous catheter with tip overlying the upper SVC / azygos vein, and NG tube entering the stomach with tip off the field of view again noted. This is a low-volume film with bibasilar atelectasis, greater than right. There is no evidence of pneumothorax or large pleural effusion. Minimal vascular congestion noted.  IMPRESSION: Endotracheal tube removal with slight increase in bibasilar atelectasis. Support apparatus otherwise unchanged.   Original Report Authenticated By: Rosendo Gros, M.D.     Significant Diagnostic Studies: CBC Lab Results  Component Value Date   WBC 5.0 01/24/2012   HGB 7.6* 01/24/2012   HCT 23.0* 01/24/2012   MCV 88.5 01/24/2012   PLT 135* 01/24/2012    BMET    Component Value Date/Time   NA 139 01/24/2012 0408   K 3.9 01/24/2012 0408   CL 107 01/24/2012 0408   CO2 27 01/24/2012 0408   GLUCOSE 102* 01/24/2012 0408   GLUCOSE 96 05/26/2006 1118   BUN 17 01/24/2012 0408   CREATININE 1.35 01/24/2012 0408   CALCIUM 8.4 01/24/2012 0408   GFRNONAA 56* 01/24/2012 0408   GFRAA 64* 01/24/2012 0408    COAG Lab Results  Component Value Date   INR 1.56* 01/21/2012   INR 1.78* 01/21/2012   INR 1.16 01/18/2012   No results found for this basename: PTT    I/O last 3 completed shifts: In: 4596.5 [I.V.:2484.5; NG/GT:120; IV Piggyback:1992] Out: 2630 [Urine:2480; Emesis/NG output:150]    Physical Examination BP Readings from Last 3 Encounters:  01/24/12 109/70  01/24/12 109/70  01/10/12 97/65   Temp Readings from Last 3 Encounters:  01/24/12 98.1 F (36.7 C) Oral  01/24/12 98.1 F (36.7 C) Oral  01/10/12 98 F (36.7 C) Oral   SpO2 Readings from Last 3 Encounters:  01/24/12 96%  01/24/12 96%  01/10/12 98%   Pulse Readings from Last 3 Encounters:  01/24/12 89  01/24/12 89  01/10/12 87    General: A&O x 3, WDWN male in NAD Pulmonary: normal non-labored breathing , without Rales, rhonchi,  wheezing Cardiac: Heart rate : regular ,  Abdomen:abdomen soft, non-tender and normal active bowel sounds Abdominal wound:clean, dry, intact  Neurologic: A&O X 3; Appropriate Affect ; SENSATION: normal; MOTOR FUNCTION:  moving all extremities equally. Speech is fluent/normal No pitting edema Vascular Exam:BLE warm and well perfused Extremities without ischemic changes, no Gangrene, no cellulitis; no open wounds;    Assessment/Plan: Alfred [name] is a 61 y.o. male who is 3 Days Post-Op Procedure(s): REMOVAL OF ABDOMINAL AORTIC STENT GRAFT ANEURYSM ABDOMINAL AORTIC REPAIR Acute blood loss anemia asymptomatic - may  be excess fluid - ? Lasix today vs transfuse BUN/CR normalizing + flatus/no BM - post-op ilieus resolving - begin cl liq Transfer to 2000 Pain controlled with PCA   ROCZNIAK,REGINA J [phone]  Details as above Still awaiting culture results +flatus taking some liquids Cont to follow blood loss anemia and renal function for now Ambullate  Fabienne Bruns, MD [facility] [place] Office: [phone] Pager: [phone]  01/24/2012 7:31 AM

## 2012-01-24 NOTE — Progress Notes (Signed)
CRITICAL VALUE ALERT  Critical value received: vancomycin trought 40.6  Date of notification:  01/24/2012  Time of notification:  1307  Critical value read back:yes  Nurse who received alert:  Harlow Asa, RN  MD notified (1st page):  Verlene Mayer, PharmD  Time of first page:  1315  MD notified (2nd page):  Time of second page:  Responding MD: Verlene Mayer, PharmD  Time MD responded:  1350

## 2012-01-24 NOTE — Progress Notes (Signed)
Name: Alfred [name] MRN: [id number] DOB: August 04, 1950    LOS: 6  Referring Provider:  Fabienne Bruns Reason for Referral:  Respiratory failure  PULMONARY / CRITICAL CARE MEDICINE    Brief patient description:  61 yo male s/p removal of infected AAA graft.  Required large volume transfusion intra-op.  Remained on vent, pressors post-op 8/23 and PCCM consulted.  Events Since Admission: 8/23 OR  Current Status: Doing well, asking for home pain med program to be resumed, on clr liquids  Vital Signs: Temp:  [97.7 F (36.5 C)-98.7 F (37.1 C)] 98 F (36.7 C) (08/26 0756) Pulse Rate:  [89-131] 89  (08/26 0700) Resp:  [11-32] 12  (08/26 0809) BP: (99-140)/(58-86) 109/70 mmHg (08/26 0700) SpO2:  [91 %-100 %] 98 % (08/26 0809) Weight:  [123.1 kg (271 lb 6.2 oz)] 123.1 kg (271 lb 6.2 oz) (08/25 2200)  Physical Examination: General:  Awake and alert   Neuro:  No focal deficits HEENT:Rt IJ in place.  Pupils reactive. Neck:  supple Cardiovascular:  s1s2 regular, no murmur, extremities are cool LE Lungs:clear Abdomen:  Wound dressing clean, no bowel sounds Musculoskeletal:  No edema, extremities cool Skin:  Raised, skin colored rash on upper and lower extremities with sparing of trunk and face    ASSESSMENT AND PLAN  PULMONARY  Lab 01/22/12 0400 01/21/12 1710 01/21/12 1529 01/21/12 1427 01/21/12 1157  PHART 7.466* 7.404 7.310* 7.316* 7.455*  PCO2ART 33.0* 33.5* 43.5 44.3 39.8  PO2ART 112.0* 186.0* 409.0* 366.0* 415.0*  HCO3 23.8 21.5 22.6 23.4 28.5*  O2SAT 99.0 100.0 100.0 100.0 100.0   Ventilator Settings:    CXR: 8/26:  Improved aeration ETT:  8/23>>8/24  A:  Acute respiratory failure after removal of infected AAA graft.>>resolved P:   IS Oxygen titration  CARDIOVASCULAR  Lab 01/22/12 0412 01/21/12 2212 01/21/12 1612  TROPONINI <0.30 <0.30 <0.30  LATICACIDVEN -- -- --  PROBNP -- -- --    Lines:  Rt PICC 8/12>>8/22 Rt IJ cortise 8/23>>8/24 Lt IJ CVL  8/23>>8/26  A: Hypovolemic, hemorrhagic, septic shock.  I>>O +13L>>>resolved P:  Reduce IVF  50cc/hr   RENAL  Lab 01/24/12 0408 01/23/12 0315 01/22/12 0420 01/21/12 1600 01/21/12 1529 01/18/12 1250  NA 139 137 134* 136 135 --  K 3.9 4.0 -- -- -- --  CL 107 105 104 103 -- 95*  CO2 27 26 23 22  -- 30  BUN 17 16 13 10  -- 8  CREATININE 1.35 1.37* 1.17 0.87 -- 0.65  CALCIUM 8.4 8.2* 8.7 9.9 -- 8.8  MG -- -- 2.0 1.6 -- --  PHOS -- -- -- -- -- --   Intake/Output      08/25 0701 - 08/26 0700 08/26 0701 - 08/27 0700   I.V. (mL/kg) 1468.5 (11.9)    NG/GT 30    IV Piggyback 1167    Total Intake(mL/kg) 2665.5 (21.7)    Urine (mL/kg/hr) 1675 (0.6)    Emesis/NG output     Total Output 1675    Net +990.5          Foley:  8/23>>8/25  A:  Oliguria slowly better, rising Cr with Acute renal insufficiency d/t hypoperfusion P:   Monitor renal fx, urine outpt, electrolytes Maintain hemodynamics Take out foley  GASTROINTESTINAL  Lab 01/23/12 0315 01/22/12 0420 01/18/12 1250  AST 21 22 15   ALT 13 14 11   ALKPHOS 56 43 68  BILITOT 1.4* 1.2 0.4  PROT 4.7* 4.6* 6.6  ALBUMIN 2.0* 2.3* 2.5*  A:  Nutrition. Postop ileus P:   NPO for now NG to LIS  HEMATOLOGIC  Lab 01/24/12 0408 01/23/12 0315 01/22/12 0420 01/21/12 1800 01/21/12 1600 01/21/12 1420 01/18/12 1250  HGB 7.6* 8.6* 10.3* 11.1* 9.7* -- --  HCT 23.0* 25.5* 29.5* 32.2* 28.2* -- --  PLT 135* 138* 151 126* 149* -- --  INR -- -- -- -- 1.56* 1.78* 1.16  APTT -- -- -- -- 39* 41* --   A:  Acute blood loss anemia during surgery. P:  F/u CBC, coags Transfuse as needed for bleeding and to maintain Hb > 7  INFECTIOUS  Lab 01/24/12 0408 01/23/12 0315 01/22/12 0420 01/21/12 1800 01/21/12 1600  WBC 5.0 7.3 7.3 8.8 11.9*  PROCALCITON -- -- -- -- --   Cultures: 8/23 Abd abscess>>NGTD>>>  Antibiotics: 8/20 Vancomycin (graft abscess) >> 8/20 Meropenem(graft abscess)>> 8/20 Rifampin(graft abscess)>>  A:  Abdominal abscess  with infected AAA graft. P:   Abx as above F/u wound cultures>>NGTD  ? Which ABX to continue as outpt or ongoing?  ENDOCRINE  Lab 01/23/12 1959 01/23/12 1625 01/23/12 1242 01/23/12 0808 01/23/12 0348  GLUCAP 93 99 103* 118* 104*   A:  Hyperglycemia. P:   SSI  NEUROLOGIC  A:  pain control. P:    Pain control>>start home program and get off PCA  DERMATOLOGY  A: Rash ?from betadine after surgery. P: PRN benadryl Try to avoid steroids  BEST PRACTICE / DISPOSITION Level of Care:  ICU>>ok to tfr to SDU Primary Service:  VVS Consultants:  PCCM Code Status:  Full Diet:  Clear liq DVT Px:  SCD  GI Px:  Protonix Skin Integrity:  Rash on extremities 8/23 Social / Family:  No family at bedside  Critical care time 35 minutes.  Shan Levans MD Beeper  [phone]  Cell  [phone]  If no response or cell goes to voicemail, call beeper [phone]  01/24/2012, 8:59 AM

## 2012-01-24 NOTE — Progress Notes (Signed)
[facility]    Subjective: Pt was readmitted and underwent resection of infected AA stent graft, repair of aneurysm with cadaveric aorta    Antibiotics:  Anti-infectives     Start     Dose/Rate Route Frequency Ordered Stop   01/21/12 2200   rifampin (RIFADIN) 600 mg in sodium chloride 0.9 % 100 mL IVPB        600 mg 200 mL/hr over 30 Minutes Intravenous Every 24 hours 01/21/12 1702     01/21/12 1630   cefUROXime (ZINACEF) 1.5 g in dextrose 5 % 50 mL IVPB  Status:  Discontinued        1.5 g 100 mL/hr over 30 Minutes Intravenous Every 12 hours 01/21/12 1619 01/21/12 1645   01/18/12 2200   rifampin (RIFADIN) capsule 300 mg  Status:  Discontinued        300 mg Oral Every 12 hours 01/18/12 1356 01/21/12 1619   01/18/12 1600   meropenem (MERREM) 1 g in sodium chloride 0.9 % 100 mL IVPB  Status:  Discontinued        1 g 200 mL/hr over 30 Minutes Intravenous Every 8 hours 01/18/12 1518 01/18/12 1537   01/18/12 1600   meropenem (MERREM) 2 g in sodium chloride 0.9 % 100 mL IVPB        2 g 200 mL/hr over 30 Minutes Intravenous Every 8 hours 01/18/12 1547     01/18/12 1500   vancomycin (VANCOCIN) IVPB 1000 mg/200 mL premix  Status:  Discontinued        1,000 mg 200 mL/hr over 60 Minutes Intravenous Every 8 hours 01/18/12 1351 01/18/12 1447   01/18/12 1500   meropenem (MERREM) 2 g in sodium chloride 0.9 % 100 mL IVPB  Status:  Discontinued        2 g 200 mL/hr over 30 Minutes Intravenous Every 8 hours 01/18/12 1354 01/18/12 1517   01/18/12 1500   vancomycin (VANCOCIN) 1,250 mg in sodium chloride 0.9 % 250 mL IVPB  Status:  Discontinued        1,250 mg 166.7 mL/hr over 90 Minutes Intravenous Every 8 hours 01/18/12 1448 01/24/12 1351   01/18/12 1400   meropenem (MERREM) 2 g in sodium chloride 0.9 % 100 mL IVPB  Status:  Discontinued        2 g 200 mL/hr over 30 Minutes Intravenous 3 times per day 01/18/12 1152 01/18/12 1354   01/18/12 1300   rifampin  (RIFADIN) capsule 300 mg  Status:  Discontinued        300 mg Oral 2 times daily 01/18/12 1152 01/18/12 1356   01/18/12 1200   vancomycin (VANCOCIN) IVPB 1000 mg/200 mL premix  Status:  Discontinued        1,000 mg 200 mL/hr over 60 Minutes Intravenous Every 8 hours 01/18/12 1152 01/18/12 1351          Medications: Scheduled Meds:   . docusate sodium  100 mg Oral Daily  . feeding supplement  30 mL Oral TID WC  . feeding supplement  1 Container Oral TID BM  . fentaNYL  50 mcg Transdermal Q72H  . meropenem (MERREM) IV  2 g Intravenous Q8H  . metoprolol  5 mg Intravenous Q6H  . morphine   Intravenous Q4H  . pantoprazole  40 mg Oral Q1200  . rifampin (RIFADIN) IVPB  600 mg Intravenous Q24H  . sodium chloride  10-40 mL Intracatheter Q12H  . DISCONTD: antiseptic oral rinse  15 mL Mouth Rinse QID  . DISCONTD: chlorhexidine  15 mL Mouth Rinse BID  . DISCONTD: insulin aspart  0-20 Units Subcutaneous Q4H  . DISCONTD: pantoprazole (PROTONIX) IV  40 mg Intravenous QHS  . DISCONTD: vancomycin  1,250 mg Intravenous Q8H   Continuous Infusions:   . sodium chloride 50 mL/hr at 01/24/12 0930   PRN Meds:.acetaminophen, acetaminophen, albuterol, diphenhydrAMINE, diphenhydrAMINE, diphenhydrAMINE, fentaNYL, hydrALAZINE, HYDROmorphone, labetalol, metoprolol, midazolam, naloxone, naloxone, ondansetron, ondansetron (ZOFRAN) IV, sodium chloride, sodium chloride, sodium chloride   Objective: Weight change: -1 lb 1.6 oz (-0.5 kg)  Intake/Output Summary (Last 24 hours) at 01/24/12 1716 Last data filed at 01/24/12 1623  Gross per 24 hour  Intake 2872.95 ml  Output   1800 ml  Net 1072.95 ml   Blood pressure 126/74, pulse 98, temperature 97.7 F (36.5 C), temperature source Oral, resp. rate 19, height 6\' 2"  (1.88 m), weight 271 lb 6.2 oz (123.1 kg), SpO2 100.00%. Temp:  [97.7 F (36.5 C)-98.7 F (37.1 C)] 97.7 F (36.5 C) (08/26 1154) Pulse Rate:  [89-118] 98  (08/26 1245) Resp:  [11-24] 19   (08/26 1245) BP: (105-140)/(65-86) 126/74 mmHg (08/26 1245) SpO2:  [91 %-100 %] 100 % (08/26 1245) Weight:  [271 lb 6.2 oz (123.1 kg)] 271 lb 6.2 oz (123.1 kg) (08/25 2200)  Physical Exam: General: Alert and awake, oriented x3, not in any acute distress. HEENT: anicteric sclera, pupils reactive to light and accommodation, EOMI CVS regular rate, normal r,  no murmur rubs or gallops Chest: clear to auscultation bilaterally, no wheezing, rales or rhonchi Abdomen: midline incision is clean, no erythema Neuro: nonfocal  Lab Results:  Basename 01/24/12 0408 01/23/12 0315  WBC 5.0 7.3  HGB 7.6* 8.6*  HCT 23.0* 25.5*  PLT 135* 138*    BMET  Basename 01/24/12 0408 01/23/12 0315  NA 139 137  K 3.9 4.0  CL 107 105  CO2 27 26  GLUCOSE 102* 120*  BUN 17 16  CREATININE 1.35 1.37*  CALCIUM 8.4 8.2*    Micro Results: Recent Results (from the past 240 hour(s))  CATH TIP CULTURE     Status: Normal   Collection Time   01/21/12 11:10 AM      Component Value Range Status Comment   Specimen Description GRAFT   Final    Special Requests AORTIC STENT PT ON VANCOMYCIN   Final    Culture NO GROWTH 2 DAYS   Final    Report Status 01/24/2012 FINAL   Final   CATH TIP CULTURE     Status: Normal   Collection Time   01/21/12 12:00 PM      Component Value Range Status Comment   Specimen Description GRAFT   Final    Special Requests RIGHT AND LEFT ILIAC LIMB STENT PT ON VANCOMYCIN   Final    Culture NO GROWTH 2 DAYS   Final    Report Status 01/24/2012 FINAL   Final   WOUND CULTURE     Status: Normal   Collection Time   01/21/12 12:12 PM      Component Value Range Status Comment   Specimen Description GRAFT   Final    Special Requests AORTIC STENT PT ON VANCOMYCIN   Final    Gram Stain     Final    Value: NO WBC SEEN     NO SQUAMOUS EPITHELIAL CELLS SEEN     NO ORGANISMS SEEN   Culture NO GROWTH 3 DAYS   Final  Report Status 01/24/2012 FINAL   Final   ANAEROBIC CULTURE     Status: Normal  (Preliminary result)   Collection Time   01/21/12 12:12 PM      Component Value Range Status Comment   Specimen Description GRAFT   Final    Special Requests AORTIC STENT PT ON VANCOMYCIN   Final    Gram Stain PENDING   Incomplete    Culture     Final    Value: NO ANAEROBES ISOLATED; CULTURE IN PROGRESS FOR 5 DAYS   Report Status PENDING   Incomplete   WOUND CULTURE     Status: Normal   Collection Time   01/21/12 12:15 PM      Component Value Range Status Comment   Specimen Description GRAFT   Final    Special Requests LEFT ILIAC LIMB STENT PT ON VANCOMYCIN   Final    Gram Stain     Final    Value: NO WBC SEEN     NO SQUAMOUS EPITHELIAL CELLS SEEN     NO ORGANISMS SEEN   Culture NO GROWTH 3 DAYS   Final    Report Status 01/24/2012 FINAL   Final   ANAEROBIC CULTURE     Status: Normal (Preliminary result)   Collection Time   01/21/12 12:15 PM      Component Value Range Status Comment   Specimen Description GRAFT   Final    Special Requests LEFT ILIAC LIMB STENT PT ON VANCOMYCIN ON SWAB   Final    Gram Stain PENDING   Incomplete    Culture     Final    Value: NO ANAEROBES ISOLATED; CULTURE IN PROGRESS FOR 5 DAYS   Report Status PENDING   Incomplete   WOUND CULTURE     Status: Normal   Collection Time   01/21/12 12:16 PM      Component Value Range Status Comment   Specimen Description AORTA   Final    Special Requests ANEURYSM SAC PT ON VANCOMYCIN   Final    Gram Stain     Final    Value: FEW WBC PRESENT,BOTH PMN AND MONONUCLEAR     NO SQUAMOUS EPITHELIAL CELLS SEEN     NO ORGANISMS SEEN   Culture NO GROWTH 3 DAYS   Final    Report Status 01/24/2012 FINAL   Final   ANAEROBIC CULTURE     Status: Normal (Preliminary result)   Collection Time   01/21/12 12:16 PM      Component Value Range Status Comment   Specimen Description AORTA   Final    Special Requests ANEURYSM SAC PT ON VANCOMYCIN   Final    Gram Stain PENDING   Incomplete    Culture     Final    Value: NO ANAEROBES  ISOLATED; CULTURE IN PROGRESS FOR 5 DAYS   Report Status PENDING   Incomplete   TISSUE CULTURE     Status: Normal   Collection Time   01/21/12 12:18 PM      Component Value Range Status Comment   Specimen Description AORTA   Final    Special Requests AORTIC WALL PT ON VANCOMYCIN   Final    Gram Stain     Final    Value: NO WBC SEEN     NO ORGANISMS SEEN   Culture NO GROWTH 3 DAYS   Final    Report Status 01/24/2012 FINAL   Final   TISSUE CULTURE     Status:  Normal   Collection Time   01/21/12 12:58 PM      Component Value Range Status Comment   Specimen Description AORTA   Final    Special Requests ABDOMINAL THROMBUS PT ON VANCOMYCIN   Final    Gram Stain     Final    Value: NO WBC SEEN     NO ORGANISMS SEEN   Culture NO GROWTH 3 DAYS   Final    Report Status 01/24/2012 FINAL   Final     Studies/Results: Dg Chest Port 1 View  01/24/2012  *RADIOLOGY REPORT*  Clinical Data: Follow up of atelectasis after a abdominal aortic aneurysm repair.  PORTABLE CHEST - 1 VIEW  Comparison: 1 day prior  Findings: Left IJ central line terminates at high SVC.  Nasogastric tube removal.  Apical lordotic patient positioning. Cardiomegaly accentuated by AP portable technique.  Probable small left pleural effusion. No pneumothorax.  Left greater than right bibasilar subsegmental atelectasis is not significantly changed.  Right IJ Cordis sheath has also been removed in the interval.  IMPRESSION:  1. No significant change since one day prior. 2.  Small left pleural effusion with adjacent bibasilar atelectasis. 3.  Removal of nasogastric tube and right IJ Cordis sheath.   Original Report Authenticated By: Consuello Bossier, M.D.    Dg Chest Port 1 View  01/23/2012  *RADIOLOGY REPORT*  Clinical Data: 61 year old male - status post infected abdominal aortic stent graft removal and abdominal aortic repair.  PORTABLE CHEST - 1 VIEW  Comparison: 01/22/2012 and prior chest radiographs  Findings: An endotracheal tube has  been removed. A right IJ central venous catheter sheath, left IJ central venous catheter with tip overlying the upper SVC / azygos vein, and NG tube entering the stomach with tip off the field of view again noted. This is a low-volume film with bibasilar atelectasis, greater than right. There is no evidence of pneumothorax or large pleural effusion. Minimal vascular congestion noted.  IMPRESSION: Endotracheal tube removal with slight increase in bibasilar atelectasis. Support apparatus otherwise unchanged.   Original Report Authenticated By: Rosendo Gros, M.D.       Assessment/Plan: LIJAH BOURQUE is a 61 y.o. male with infected AAA graft with abscess who had failed attempts to control his infection with protracted IV antibiotics most recently with IV vancomycin and meropenem sp removal of AAA graft, and placement of cadaveric graft.  1) AAA graft infection sp resection of the graft: Pts cultures from the surrounding abscess that has been addressed and from the graft are without growth --will DC rifampin now that there is NO prosthetic material --will continue meropenem at high dose 2 grams IV q 8 hours (NOTE THIS WILL NEED TO BE CORRECTED WITH [facility] pharmacy his outpt home health co  --continue vancomycin dosed for trough of 15-20 --treat for 6 weeks postoperatively with stop date of October the 4th --I will have new PICC line placed --please have weekly cbc, bmp and vancomycin trough faxed to Dr. Daiva Eves @ [phone]  I will arrange for hospital followup with me in early September.   LOS: 6 days   Acey Lav 01/24/2012, 5:16 PM

## 2012-01-24 NOTE — Progress Notes (Signed)
Nutrition Follow-up  Intervention:    Resource Breeze supplement 3 times daily between meals (250 kcals, 9 gm protein per 8 fl oz carton)  Prostat liquid protein 30 ml 3 times daily with meals (100 kcals, 15 gm protein per dose) RD to follow for nutrition care plan  Assessment:   Patient extubated 8/24. Off Precedex. Post-op ileus resolving. NGT discontinued. Taking some clear liquids. Amenable to trying Resource Breeze supplement to help meet protein needs -- RD to order.  Diet Order:  Clear Liquids  Meds: Scheduled Meds:   . docusate sodium  100 mg Oral Daily  . fentaNYL  50 mcg Transdermal Q72H  . meropenem (MERREM) IV  2 g Intravenous Q8H  . metoprolol  5 mg Intravenous Q6H  . morphine   Intravenous Q4H  . pantoprazole  40 mg Oral Q1200  . rifampin (RIFADIN) IVPB  600 mg Intravenous Q24H  . sodium chloride  10-40 mL Intracatheter Q12H  . vancomycin  1,250 mg Intravenous Q8H  . DISCONTD: antiseptic oral rinse  15 mL Mouth Rinse QID  . DISCONTD: chlorhexidine  15 mL Mouth Rinse BID  . DISCONTD: insulin aspart  0-20 Units Subcutaneous Q4H  . DISCONTD: pantoprazole (PROTONIX) IV  40 mg Intravenous QHS   Continuous Infusions:   . sodium chloride 50 mL/hr at 01/24/12 0930   PRN Meds:.acetaminophen, acetaminophen, albuterol, diphenhydrAMINE, diphenhydrAMINE, diphenhydrAMINE, fentaNYL, hydrALAZINE, HYDROmorphone, labetalol, metoprolol, midazolam, naloxone, naloxone, ondansetron, ondansetron (ZOFRAN) IV, sodium chloride, sodium chloride, sodium chloride  Labs:  CMP     Component Value Date/Time   NA 139 01/24/2012 0408   K 3.9 01/24/2012 0408   CL 107 01/24/2012 0408   CO2 27 01/24/2012 0408   GLUCOSE 102* 01/24/2012 0408   GLUCOSE 96 05/26/2006 1118   BUN 17 01/24/2012 0408   CREATININE 1.35 01/24/2012 0408   CALCIUM 8.4 01/24/2012 0408   PROT 4.7* 01/23/2012 0315   ALBUMIN 2.0* 01/23/2012 0315   AST 21 01/23/2012 0315   ALT 13 01/23/2012 0315   ALKPHOS 56 01/23/2012 0315   BILITOT 1.4* 01/23/2012 0315   GFRNONAA 56* 01/24/2012 0408   GFRAA 64* 01/24/2012 0408     Intake/Output Summary (Last 24 hours) at 01/24/12 1153 Last data filed at 01/24/12 1100  Gross per 24 hour  Intake 2877.95 ml  Output   1800 ml  Net 1077.95 ml    CBG (last 3)   Basename 01/23/12 1959 01/23/12 1625 01/23/12 1242  GLUCAP 93 99 103*    Weight Status:  123.1 kg (8/25) -- stable  Re-estimated needs:  2200-2400 kcals, 120-130 gm protein  New Nutrition Dx:  Increased Nutrient Needs r/t post-op healing as evidenced by re-estimated nutrition needs, ongoing  New Goal:  Oral intake with meals & supplements to meet >/= 90% of estimated nutrition needs, progressing  Monitor:  PO & supplemental intake, weight, labs, I/O's  Kirkland Hun, RD, LDN Pager #: [phone] After-Hours Pager #: [phone]

## 2012-01-24 NOTE — Evaluation (Signed)
Occupational Therapy Evaluation Patient Details Name: Alfred [name] MRN: [id number] DOB: November 16, 1950 Today's Date: 01/24/2012 T   OT Assessment / Plan / Recommendation Clinical Impression  61 yo male s/p removal of infected AAA graft. Pt will benefit from skilled OT to increase I and return to PLOF    OT Assessment  Patient needs continued OT Services    Follow Up Recommendations  Home health OT       Equipment Recommendations  None recommended by OT       Frequency  Min 2X/week    Precautions / Restrictions Restrictions Weight Bearing Restrictions: No       ADL  Grooming: Simulated;Set up Where Assessed - Grooming: Unsupported sitting Upper Body Bathing: Performed;Minimal assistance Where Assessed - Upper Body Bathing: Unsupported sitting Lower Body Bathing: Simulated;Minimal assistance Where Assessed - Lower Body Bathing: Supported sit to stand Upper Body Dressing: Simulated;Minimal assistance Where Assessed - Upper Body Dressing: Unsupported sitting Where Assessed - Lower Body Dressing: Unsupported sitting Tub/Shower Transfer Method: Not assessed    OT Diagnosis: Generalized weakness  OT Problem List: Decreased strength;Decreased activity tolerance;Pain OT Treatment Interventions: Self-care/ADL training;Patient/family education   OT Goals Acute Rehab OT Goals OT Goal Formulation: With patient Time For Goal Achievement: 02/11/12 Potential to Achieve Goals: Good ADL Goals Pt Will Perform Grooming: with set-up;Standing at sink ADL Goal: Grooming - Progress: Goal set today Pt Will Perform Lower Body Dressing: with set-up;Sit to stand from chair ADL Goal: Lower Body Dressing - Progress: Goal set today Pt Will Transfer to Toilet: with supervision;Comfort height toilet;Ambulation ADL Goal: Toilet Transfer - Progress: Goal set today  Visit Information  Last OT Received On: 01/24/12       Prior Functioning  Vision/Perception  Home Living Lives With:  Spouse Available Help at Discharge: Family;Friend(s) Type of Home: House Home Layout: Two level;Bed/bath upstairs Bathroom Shower/Tub: Tub/shower unit;Walk-in shower Home Adaptive Equipment: Straight cane Communication Communication: No difficulties      Cognition  Overall Cognitive Status: Appears within functional limits for tasks assessed/performed       Mobility   Min A with transition from supine to sitting EOB using bed rail        End of Session OT - End of Session Activity Tolerance: Patient tolerated treatment well Patient left: in bed;Other (comment) (sitting EOB with PT present)  GO     Britany Callicott, [place] Kung 01/24/2012, 11:45 AM

## 2012-01-25 ENCOUNTER — Inpatient Hospital Stay (HOSPITAL_COMMUNITY): Payer: Managed Care, Other (non HMO)

## 2012-01-25 ENCOUNTER — Encounter (HOSPITAL_COMMUNITY): Payer: Self-pay | Admitting: Vascular Surgery

## 2012-01-25 LAB — CBC
HCT: 24.5 % — ABNORMAL LOW (ref 39.0–52.0)
Hemoglobin: 8.1 g/dL — ABNORMAL LOW (ref 13.0–17.0)
MCH: 29.1 pg (ref 26.0–34.0)
MCHC: 33.1 g/dL (ref 30.0–36.0)
MCV: 88.1 fL (ref 78.0–100.0)
RDW: 15.4 % (ref 11.5–15.5)

## 2012-01-25 LAB — BASIC METABOLIC PANEL
BUN: 17 mg/dL (ref 6–23)
CO2: 28 mEq/L (ref 19–32)
Chloride: 105 mEq/L (ref 96–112)
Creatinine, Ser: 1.42 mg/dL — ABNORMAL HIGH (ref 0.50–1.35)
Glucose, Bld: 113 mg/dL — ABNORMAL HIGH (ref 70–99)
Potassium: 3.9 mEq/L (ref 3.5–5.1)

## 2012-01-25 MED ORDER — MORPHINE SULFATE 2 MG/ML IJ SOLN
2.0000 mg | INTRAMUSCULAR | Status: DC | PRN
  Administered 2012-01-25 – 2012-01-26 (×3): 2 mg via INTRAVENOUS
  Filled 2012-01-25 (×3): qty 1

## 2012-01-25 MED ORDER — MORPHINE SULFATE 4 MG/ML IJ SOLN: 4.0000 mg | INTRAMUSCULAR | Status: DC | PRN

## 2012-01-25 MED ORDER — ACETAMINOPHEN 325 MG PO TABS
650.0000 mg | ORAL_TABLET | ORAL | Status: DC | PRN
  Administered 2012-01-25 – 2012-01-28 (×6): 650 mg via ORAL
  Filled 2012-01-25 (×7): qty 2

## 2012-01-25 NOTE — Progress Notes (Addendum)
[facility]  Post-op  Intra-abdominal Surgery note  Date of Surgery: 01/18/2012 - 01/21/2012 Surgeon: Moishe Spice): Sherren Kerns, MD Chuck Hint, MD POD: 4 Days Post-Op Procedure(s): REMOVAL OF ABDOMINAL AORTIC STENT GRAFT ANEURYSM ABDOMINAL AORTIC REPAIR  History of Present Illness  Alfred [name] is a 61 y.o. male who is  up s/p Procedure(s): REMOVAL OF ABDOMINAL AORTIC STENT GRAFT ANEURYSM ABDOMINAL AORTIC REPAIR Pt is doing well. complains of incisional pain; denies nausea/vomiting; denies diarrhea. has had flatus;has had BM  IMAGING: Dg Chest Port 1 View  01/24/2012  *RADIOLOGY REPORT*  Clinical Data: Follow up of atelectasis after a abdominal aortic aneurysm repair.  PORTABLE CHEST - 1 VIEW  Comparison: 1 day prior  Findings: Left IJ central line terminates at high SVC.  Nasogastric tube removal.  Apical lordotic patient positioning. Cardiomegaly accentuated by AP portable technique.  Probable small left pleural effusion. No pneumothorax.  Left greater than right bibasilar subsegmental atelectasis is not significantly changed.  Right IJ Cordis sheath has also been removed in the interval.  IMPRESSION:  1. No significant change since one day prior. 2.  Small left pleural effusion with adjacent bibasilar atelectasis. 3.  Removal of nasogastric tube and right IJ Cordis sheath.   Original Report Authenticated By: Consuello Bossier, M.D.     Significant Diagnostic Studies: CBC Lab Results  Component Value Date   WBC 4.1 01/25/2012   HGB 8.1* 01/25/2012   HCT 24.5* 01/25/2012   MCV 88.1 01/25/2012   PLT 174 01/25/2012    BMET    Component Value Date/Time   NA 139 01/25/2012 0605   K 3.9 01/25/2012 0605   CL 105 01/25/2012 0605   CO2 28 01/25/2012 0605   GLUCOSE 113* 01/25/2012 0605   GLUCOSE 96 05/26/2006 1118   BUN 17 01/25/2012 0605   CREATININE 1.42* 01/25/2012 0605   CALCIUM 8.4 01/25/2012 0605   GFRNONAA 52* 01/25/2012 0605   GFRAA 61*  01/25/2012 0605    COAG Lab Results  Component Value Date   INR 1.56* 01/21/2012   INR 1.78* 01/21/2012   INR 1.16 01/18/2012   No results found for this basename: PTT    I/O last 3 completed shifts: In: 3930.5 [P.O.:880; I.V.:1783.5; IV Piggyback:1267] Out: 1850 [Urine:1850]    Physical Examination BP Readings from Last 3 Encounters:  01/25/12 110/72  01/25/12 110/72  01/10/12 97/65   Temp Readings from Last 3 Encounters:  01/25/12 98.7 F (37.1 C) Oral  01/25/12 98.7 F (37.1 C) Oral  01/10/12 98 F (36.7 C) Oral   SpO2 Readings from Last 3 Encounters:  01/25/12 98%  01/25/12 98%  01/10/12 98%   Pulse Readings from Last 3 Encounters:  01/25/12 88  01/25/12 88  01/10/12 87    General: A&O x 3, WDWN male in NAD Pulmonary: normal non-labored breathing , without Rales, rhonchi,  wheezing Cardiac: Heart rate : regular ,  Abdomen:normal active bowel sounds Abdominal wound:clean, dry, intact  Neurologic: A&O X 3; Appropriate Affect ; SENSATION: normal; MOTOR FUNCTION:  moving all extremities equally. Speech is fluent/normal  Vascular Exam:BLE warm and well perfused Extremities without ischemic changes, no Gangrene, no cellulitis; no open wounds;   LOWER EXTREMITY PULSES           RIGHT  LEFT      POSTERIOR TIBIAL  palpable  palpable       DORSALIS PEDIS      ANTERIOR TIBIAL  palpable  palpable         Assessment/Plan: Alfred [name] is a 61 y.o. male who is 4 Days Post-Op Procedure(s): REMOVAL OF ABDOMINAL AORTIC STENT GRAFT ANEURYSM ABDOMINAL AORTIC REPAIR No growth on cultures today final report Pending picc line placement Continue clear liquids Plan to D/C in am        Clinton Gallant [facility] [phone] 01/25/2012 7:41 AM      Overall improving.  Had BM. Still with early satiety and pain issues  Incision healing Blood loss anemia stable Elevated creatinine stable Cont to ambulate Advance diet Cont to wean  pain meds  Fabienne Bruns, MD [facility] Office: [phone] Pager: [phone]  Home later this week probably thurs/Fri

## 2012-01-25 NOTE — Progress Notes (Signed)
Name: Alfred [name] MRN: [id number] DOB: 1950/09/23    LOS: 7  Referring Provider:  Fabienne Bruns Reason for Referral:  Respiratory failure  PULMONARY / CRITICAL CARE MEDICINE    Brief patient description:  61 yo male s/p removal of infected AAA graft.  Required large volume transfusion intra-op.  Remained on vent, pressors post-op 8/23 and PCCM consulted.  Events Since Admission: 8/23 REMOVAL OF ABDOMINAL AORTIC STENT GRAFT  ANEURYSM ABDOMINAL AORTIC REPAIR   Current Status: Doing well, asking for home pain med program to be resumed, on clr liquids  Vital Signs: Temp:  [97.7 F (36.5 C)-99.3 F (37.4 C)] 98.7 F (37.1 C) (08/27 0453) Pulse Rate:  [88-111] 88  (08/27 0453) Resp:  [15-20] 18  (08/27 0755) BP: (107-130)/(53-81) 110/72 mmHg (08/27 0453) SpO2:  [97 %-100 %] 97 % (08/27 0755)  Physical Examination: General:  Awake and alert   Neuro:  No focal deficits HEENT:Lt IJ in place.  Pupils reactive. Neck:  supple Cardiovascular:  s1s2 regular, no murmur, extremities are cool LE Lungs:clear Abdomen:  Wound dressing clean, no bowel sounds Musculoskeletal:  No edema, extremities cool Skin:  Raised, skin colored rash on upper and lower extremities with sparing of trunk and face - resolved    ASSESSMENT AND PLAN  PULMONARY  Lab 01/22/12 0400 01/21/12 1710 01/21/12 1529 01/21/12 1427 01/21/12 1157  PHART 7.466* 7.404 7.310* 7.316* 7.455*  PCO2ART 33.0* 33.5* 43.5 44.3 39.8  PO2ART 112.0* 186.0* 409.0* 366.0* 415.0*  HCO3 23.8 21.5 22.6 23.4 28.5*  O2SAT 99.0 100.0 100.0 100.0 100.0   Ventilator Settings:    CXR: 8/26:  Improved aeration ETT:  8/23>>8/24  A:  Acute respiratory failure after removal of infected AAA graft.>>resolved P:   IS Oxygen titration  CARDIOVASCULAR  Lab 01/22/12 0412 01/21/12 2212 01/21/12 1612  TROPONINI <0.30 <0.30 <0.30  LATICACIDVEN -- -- --  PROBNP -- -- --    Lines:  Rt PICC 8/12>>8/22 Rt IJ cortise 8/23>>8/24 Lt  IJ CVL 8/23>>8/26  A: Hypovolemic, hemorrhagic, septic shock.  I>>O +13L>>>resolved P:  Dc IVFs   RENAL  Lab 01/25/12 0605 01/24/12 0408 01/23/12 0315 01/22/12 0420 01/21/12 1600  NA 139 139 137 134* 136  K 3.9 3.9 -- -- --  CL 105 107 105 104 103  CO2 28 27 26 23 22   BUN 17 17 16 13 10   CREATININE 1.42* 1.35 1.37* 1.17 0.87  CALCIUM 8.4 8.4 8.2* 8.7 9.9  MG -- -- -- 2.0 1.6  PHOS -- -- -- -- --   Intake/Output      08/26 0701 - 08/27 0700 08/27 0701 - 08/28 0700   P.O. 880    I.V. (mL/kg) 1215 (9.9)    NG/GT     IV Piggyback 100    Total Intake(mL/kg) 2195 (17.8)    Urine (mL/kg/hr) 800 (0.3)    Total Output 800    Net +1395         Urine Occurrence 1 x    Stool Occurrence  1 x    Foley:  8/23>>8/25  A:  Oliguria slowly better, rising Cr with Acute renal insufficiency d/t hypoperfusion P:   Monitor renal fx, urine outpt, electrolytes Take out foley  GASTROINTESTINAL  Lab 01/23/12 0315 01/22/12 0420 01/18/12 1250  AST 21 22 15   ALT 13 14 11   ALKPHOS 56 43 68  BILITOT 1.4* 1.2 0.4  PROT 4.7* 4.6* 6.6  ALBUMIN 2.0* 2.3* 2.5*    A:  Nutrition. Postop ileus  P:   Liquid diet - advance  HEMATOLOGIC  Lab 01/25/12 0605 01/24/12 0408 01/23/12 0315 01/22/12 0420 01/21/12 1800 01/21/12 1600 01/21/12 1420 01/18/12 1250  HGB 8.1* 7.6* 8.6* 10.3* 11.1* -- -- --  HCT 24.5* 23.0* 25.5* 29.5* 32.2* -- -- --  PLT 174 135* 138* 151 126* -- -- --  INR -- -- -- -- -- 1.56* 1.78* 1.16  APTT -- -- -- -- -- 39* 41* --   A:  Acute blood loss anemia during surgery. P:  F/u CBC, coags Transfuse as needed for bleeding and to maintain Hb > 7  INFECTIOUS  Lab 01/25/12 0605 01/24/12 0408 01/23/12 0315 01/22/12 0420 01/21/12 1800  WBC 4.1 5.0 7.3 7.3 8.8  PROCALCITON -- -- -- -- --   Cultures: 8/23 Abd abscess>>NGTD>>>  Antibiotics: 8/20 Vancomycin (graft abscess) >> 8/20 Meropenem(graft abscess)>> 8/20 Rifampin(graft abscess)>>8/26  A:  Abdominal abscess with  infected AAA graft. P:   Abx as per ID Meropenem/ vanc x 6 wks via picc planned  ENDOCRINE  Lab 01/23/12 1959 01/23/12 1625 01/23/12 1242 01/23/12 0808 01/23/12 0348  GLUCAP 93 99 103* 118* 104*   A:  Hyperglycemia. P:   SSI  NEUROLOGIC  A:  pain control. P:    Pain control>>start home program and get off PCA  DERMATOLOGY  A: Rash ?from betadine after surgery. P: PRN benadryl Try to avoid steroids  BEST PRACTICE / DISPOSITION Level of Care:  tele Primary Service:  VVS Consultants:  PCCM Code Status:  Full Diet:  Clear liq DVT Px:  SCD  GI Px:  Protonix Skin Integrity:  Rash on extremities 8/23 Social / Family:  No family at bedside    Oretha Milch. MD Beeper  [phone]  If no response , call beeper [phone] PCCM to sign off 01/25/2012, 8:25 AM

## 2012-01-25 NOTE — Progress Notes (Signed)
ANTIBIOTIC CONSULT NOTE - FOLLOW UP  Pharmacy Consult for Vancomycin (random Vanc level today) Indication: Abdominal abscess with infected AAA graft   No Known Allergies  Patient Measurements: Height: 6\' 2"  (188 cm) Weight: 271 lb 6.2 oz (123.1 kg) IBW/kg (Calculated) : 82.2   Vital Signs: Temp: 98.7 F (37.1 C) (08/27 0453) Temp src: Oral (08/27 0453) BP: 110/72 mmHg (08/27 0453) Pulse Rate: 88  (08/27 0453) Intake/Output from previous day: 08/26 0701 - 08/27 0700 In: 2195 [P.O.:880; I.V.:1215; IV Piggyback:100] Out: 800 [Urine:800] Intake/Output from this shift: Total I/O In: 480 [P.O.:480] Out: -   Labs:  Basename 01/25/12 0605 01/24/12 0408 01/23/12 0315  WBC 4.1 5.0 7.3  HGB 8.1* 7.6* 8.6*  PLT 174 135* 138*  LABCREA -- -- --  CREATININE 1.42* 1.35 1.37*   Estimated Creatinine Clearance: 77.2 ml/min (by C-G formula based on Cr of 1.42).   Assessment:  61 y/o male patient receiving vanc/merrem for AAA stent graft infection. POD#4 s/p removal of infected AAA stent graft/ AAA repair.  Rifamipin DC'd 8/26.  Currently afebrile, Scr = 1.42. Crcl ~ 24ml/min. I/O 2230/800.  Scr trending up and patient has become oliguric. Vanc trough yesterday =40.6 on 1250 mg IV q8h, which is above goal. Drug accumulation d/t poor renal output thus Vancomycin held. This AM random vancomycin level = 27 mcg/ml which is still above goal. No growth on cultures. Meropenem/vanc x 6 wks via PICC planned per ID/CCM.  Plan is to DC patient in AM per Dr. Darrick Penna.  ID notes to continue high dose meropenem 2gm IV q8h/ no dose adjustment necessary.  Pharmacokinetic parameters based on above two vancomycin levels and no vancomycin dose given between these levels. : half life ~33hrs thus anticipate vancomycin level will be ~20 mcg/ml tonight.       Goal of Therapy:  Vancomycin trough = 15-20 mcg/ml  Plan: Random vancomycin level in AM and SCR. If level = or <38mcg/ml will plan to resume vancomycin at  lower dosage of 1250mg  IV q24h starting in AM.  Will need to recheck steady state vancomycin level as outpatient around 3rd or 4th dose and monitor SCR closely.     Noah Delaine, RPh Clinical Pharmacist Pager: [phone] 01/25/2012,10:45 AM

## 2012-01-25 NOTE — Progress Notes (Signed)
Occupational Therapy Treatment Patient Details Name: Alfred [name] MRN: [id number] DOB: 1950/06/18 Today's Date: 01/25/2012 Time: 4098-1191 OT Time Calculation (min): 23 min            Plan Discharge plan remains appropriate    Precautions / Restrictions Restrictions Weight Bearing Restrictions: No       ADL  Grooming: Performed;Min guard Where Assessed - Grooming: Unsupported standing Lower Body Bathing: Minimal assistance;Performed Where Assessed - Lower Body Bathing: Unsupported sit to stand Toilet Transfer: Performed;Min guard Toilet Transfer Method: Sit to Barista: Regular height toilet;Grab bars Toileting - Clothing Manipulation and Hygiene: Performed;Min guard Where Assessed - Glass blower/designer Manipulation and Hygiene: Standing Transfers/Ambulation Related to ADLs: Pt min guard with ambulation in the room.      :     OT Goals ADL Goals ADL Goal: Grooming - Progress: Progressing toward goals ADL Goal: Lower Body Dressing - Progress: Progressing toward goals ADL Goal: Toilet Transfer - Progress: Progressing toward goals  Visit Information  Last OT Received On: 01/25/12          Cognition  Overall Cognitive Status: Appears within functional limits for tasks assessed/performed    Mobility Transfers Transfers: Sit to Stand;Stand to Sit Sit to Stand: 4: Min guard;With armrests;From chair/3-in-1 Stand to Sit: 4: Min guard;With armrests;To toilet;To chair/3-in-1         End of Session OT - End of Session Activity Tolerance: Patient tolerated treatment well Patient left: in chair       Embry Huss, [place] Kung 01/25/2012, 10:48 AM

## 2012-01-26 LAB — CREATININE, SERUM
Creatinine, Ser: 1.41 mg/dL — ABNORMAL HIGH (ref 0.50–1.35)
GFR calc Af Amer: 61 mL/min — ABNORMAL LOW (ref >90)

## 2012-01-26 LAB — ANAEROBIC CULTURE
Gram Stain: NONE SEEN
Gram Stain: NONE SEEN

## 2012-01-26 LAB — VANCOMYCIN, RANDOM: Vancomycin Rm: 17.6 ug/mL

## 2012-01-26 MED ORDER — FERROUS SULFATE 325 (65 FE) MG PO TABS
325.0000 mg | ORAL_TABLET | Freq: Three times a day (TID) | ORAL | Status: DC
  Administered 2012-01-26 – 2012-01-28 (×6): 325 mg via ORAL
  Filled 2012-01-26 (×8): qty 1

## 2012-01-26 MED ORDER — ADULT MULTIVITAMIN W/MINERALS CH
1.0000 | ORAL_TABLET | Freq: Every morning | ORAL | Status: DC
  Administered 2012-01-26 – 2012-01-28 (×3): 1 via ORAL
  Filled 2012-01-26 (×3): qty 1

## 2012-01-26 MED ORDER — HYDROMORPHONE HCL 2 MG PO TABS
4.0000 mg | ORAL_TABLET | Freq: Four times a day (QID) | ORAL | Status: DC | PRN
  Administered 2012-01-26 – 2012-01-27 (×4): 4 mg via ORAL
  Filled 2012-01-26 (×4): qty 2

## 2012-01-26 MED ORDER — FUROSEMIDE 10 MG/ML IJ SOLN
20.0000 mg | Freq: Once | INTRAMUSCULAR | Status: AC
  Administered 2012-01-26: 20 mg via INTRAVENOUS

## 2012-01-26 MED ORDER — ATORVASTATIN CALCIUM 10 MG PO TABS
10.0000 mg | ORAL_TABLET | Freq: Every day | ORAL | Status: DC
  Administered 2012-01-26 – 2012-01-27 (×2): 10 mg via ORAL
  Filled 2012-01-26 (×3): qty 1

## 2012-01-26 MED ORDER — FUROSEMIDE 10 MG/ML IJ SOLN
INTRAMUSCULAR | Status: AC
  Filled 2012-01-26: qty 4

## 2012-01-26 MED ORDER — METOPROLOL SUCCINATE ER 25 MG PO TB24
25.0000 mg | ORAL_TABLET | Freq: Every day | ORAL | Status: DC
  Administered 2012-01-26 – 2012-01-27 (×2): 25 mg via ORAL
  Filled 2012-01-26 (×3): qty 1

## 2012-01-26 MED ORDER — VANCOMYCIN HCL 1000 MG IV SOLR
1250.0000 mg | INTRAVENOUS | Status: DC
  Administered 2012-01-26 – 2012-01-28 (×3): 1250 mg via INTRAVENOUS
  Filled 2012-01-26 (×3): qty 1250

## 2012-01-26 MED ORDER — POTASSIUM CHLORIDE CRYS ER 10 MEQ PO TBCR
10.0000 meq | EXTENDED_RELEASE_TABLET | Freq: Once | ORAL | Status: AC
  Administered 2012-01-26: 10 meq via ORAL
  Filled 2012-01-26: qty 1

## 2012-01-26 NOTE — Progress Notes (Signed)
Pt walked independently in hallway without any difficulty; pt back in bed; call bell w/i reach; will cont. To monitor.

## 2012-01-26 NOTE — Progress Notes (Addendum)
[facility] Progress Note  01/26/2012 8:13 AM POD 5  Subjective:  "I'm sore and I have swelling in my feet"  Tm 99.2 now 98  VSS  94%RA Filed Vitals:   01/26/12 0425  BP: 117/74  Pulse: 96  Temp: 98 F (36.7 C)  Resp: 16     Physical Exam: Cardiac:  RRR Lungs:  Decreased BS at bases bilaterally Abdomen:  Soft, NT/ND + BS, +BM Incisions:  C/d/i with staples in tact Extremities:  + palpable DP pulses bilaterally with right >left  CBC    Component Value Date/Time   WBC 4.1 01/25/2012 0605   RBC 2.78* 01/25/2012 0605   HGB 8.1* 01/25/2012 0605   HCT 24.5* 01/25/2012 0605   PLT 174 01/25/2012 0605   MCV 88.1 01/25/2012 0605   MCH 29.1 01/25/2012 0605   MCHC 33.1 01/25/2012 0605   RDW 15.4 01/25/2012 0605   LYMPHSABS 0.6* 01/21/2012 1800   MONOABS 1.1* 01/21/2012 1800   EOSABS 0.0 01/21/2012 1800   BASOSABS 0.0 01/21/2012 1800    BMET    Component Value Date/Time   NA 139 01/25/2012 0605   K 3.9 01/25/2012 0605   CL 105 01/25/2012 0605   CO2 28 01/25/2012 0605   GLUCOSE 113* 01/25/2012 0605   GLUCOSE 96 05/26/2006 1118   BUN 17 01/25/2012 0605   CREATININE 1.41* 01/26/2012 0500   CALCIUM 8.4 01/25/2012 0605   GFRNONAA 53* 01/26/2012 0500   GFRAA 61* 01/26/2012 0500    INR    Component Value Date/Time   INR 1.56* 01/21/2012 1600   Cx:  Tissue Cx of aorta No growth-final x 2  Anaerobic Cx of aortic sac- NGTD  Left iliac limb Cx-no growth-final  Aortic stent Cx-no growth-final  Anaerobic Cx aortic stent graft NGTD  Intake/Output Summary (Last 24 hours) at 01/26/12 0813 Last data filed at 01/26/12 0242  Gross per 24 hour  Intake   1680 ml  Output      0 ml  Net   1680 ml     Assessment/Plan:  61 y.o. 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 -acute surgical blood loss anemia stable-pt tolerating -d/c IVF-? Gentle diuresis-will d/w Dr. Darrick Penna.  Doreatha Massed, PA-C [facility] [phone] 01/26/2012 8:13 AM  Got lasix for peripheral edema today Eating some solid food Incision looks good Pedal tibial scrotal edema Still adjusting pain meds Possible d/c Friday if pain controlled and appetite continues to improve Diurese today tomorrow Friday.  Follow BUN/Cr Iron TID for anemia  Fabienne Bruns, MD [facility] [place] Office: [phone] Pager: [phone]

## 2012-01-26 NOTE — Progress Notes (Signed)
Peripherally Inserted Central Catheter/Midline Placement  The IV Nurse has discussed with the patient and/or persons authorized to consent for the patient, the purpose of this procedure and the potential benefits and risks involved with this procedure.  The benefits include less needle sticks, lab draws from the catheter and patient may be discharged home with the catheter.  Risks include, but not limited to, infection, bleeding, blood clot (thrombus formation), and puncture of an artery; nerve damage and irregular heat beat.  Alternatives to this procedure were also discussed.  PICC/Midline Placement Documentation        Alfred [name] 01/26/2012, 12:00 PM

## 2012-01-26 NOTE — Progress Notes (Addendum)
ANTIBIOTIC CONSULT NOTE - FOLLOW UP  Pharmacy Consult for Vancomycin & Meropenem Indication: abdominal abscess w/ infected AAA graft  No Known Allergies  Patient Measurements: Height: 6\' 2"  (188 cm) Weight: 271 lb 6.2 oz (123.1 kg) IBW/kg (Calculated) : 82.2   Vital Signs: Temp: 98 F (36.7 C) (08/28 0425) Temp src: Oral (08/28 0425) BP: 117/74 mmHg (08/28 0425) Pulse Rate: 96  (08/28 0425) Intake/Output from previous day: 08/27 0701 - 08/28 0700 In: 2160 [P.O.:2160] Out: -  Intake/Output from this shift: Total I/O In: 240 [P.O.:240] Out: -   Labs:  Basename 01/26/12 0500 01/25/12 0605 01/24/12 0408  WBC -- 4.1 5.0  HGB -- 8.1* 7.6*  PLT -- 174 135*  LABCREA -- -- --  CREATININE 1.41* 1.42* 1.35   Estimated Creatinine Clearance: 77.7 ml/min (by C-G formula based on Cr of 1.41).  Basename 01/26/12 0500 01/25/12 0605 01/24/12 1205  VANCOTROUGH -- -- 40.6*  VANCOPEAK -- -- --  VANCORANDOM 17.6 27.9 --  GENTTROUGH -- -- --  GENTPEAK -- -- --  GENTRANDOM -- -- --  TOBRATROUGH -- -- --  TOBRAPEAK -- -- --  TOBRARND -- -- --  AMIKACINPEAK -- -- --  AMIKACINTROU -- -- --  AMIKACIN -- -- --    Assessment:  POD # 5 removal of infected AA stent graft/AAA repair.  Vancomycin held after trough level 40.6 mcg/ml on 8/26, while on Vancomcyin 1250 mg IV q8hrs.  Random vanc level now down into target range.  Scr ~same as 8/28; baseline 0.65 on 8/20.  Plan antibiotics thru October 4th. Weekly CBC, bmet and Vancomycin troughs to Dr. Daiva Eves after discharge.  Goal of Therapy:  Vancomycin trough level 15-20 mcg/ml appropriate Meropenem dose for renal function & infection  Plan:   Resume Vancomycin with 1250 mg IV q24hrs.  Continue Meropenem 2 grams IV q8hrs.  For bmet and CBC in am. Will follow renal function.  Will need to re-check vancomycin trough level at steady state, prior to 4th or 5th dose, then weekly while on Vancomycin.  Dennie Fetters, RPh Pager:  [phone] 01/26/2012,10:21 AM

## 2012-01-26 NOTE — Progress Notes (Signed)
Physical Therapy Treatment Patient Details Name: Alfred [name] MRN: [id number] DOB: 04-28-1951 Today's Date: 01/26/2012 Time: 4098-1191 PT Time Calculation (min): 8 min  PT Assessment / Plan / Recommendation Comments on Treatment Session  Pt adm for removal of infected AAA graft.  Pt now I with mobility and no further PT needed.    Follow Up Recommendations  No PT follow up    Barriers to Discharge        Equipment Recommendations  None recommended by PT    Recommendations for Other Services    Frequency     Plan All goals met and education completed, patient dischaged from PT services    Precautions / Restrictions     Pertinent Vitals/Pain N/A    Mobility  Bed Mobility Bed Mobility: Sit to Supine;Supine to Sit Supine to Sit: 6: Modified independent (Device/Increase time);HOB elevated Sit to Supine: 6: Modified independent (Device/Increase time);HOB elevated Transfers Sit to Stand: 7: Independent Stand to Sit: 7: Independent Ambulation/Gait Ambulation/Gait Assistance: 7: Independent Ambulation Distance (Feet): 500 Feet Assistive device: None Gait Pattern: Within Functional Limits Stairs: Yes Stairs Assistance: 6: Modified independent (Device/Increase time) Stair Management Technique: One rail Right Number of Stairs: 4     Exercises     PT Diagnosis:    PT Problem List:   PT Treatment Interventions:     PT Goals Acute Rehab PT Goals PT Goal: Supine/Side to Sit - Progress: Met PT Goal: Sit to Supine/Side - Progress: Met PT Goal: Sit to Stand - Progress: Met PT Goal: Stand to Sit - Progress: Met PT Goal: Ambulate - Progress: Met PT Goal: Up/Down Stairs - Progress: Met  Visit Information  Last PT Received On: 01/26/12 Assistance Needed: +1    Subjective Data  Subjective: Pt states he is feeling better.   Cognition  Overall Cognitive Status: Appears within functional limits for tasks assessed/performed Arousal/Alertness: Awake/alert Orientation Level:  Appears intact for tasks assessed Behavior During Session: [facility] for tasks performed    Balance  Static Standing Balance Static Standing - Balance Support: No upper extremity supported Static Standing - Level of Assistance: 7: Independent Dynamic Standing Balance Dynamic Standing - Balance Support: No upper extremity supported Dynamic Standing - Level of Assistance: 7: Independent  End of Session PT - End of Session Activity Tolerance: Patient tolerated treatment well Patient left: in bed;with call bell/phone within reach Nurse Communication: Mobility status   GP     [facility] 01/26/2012, 2:23 PM  [facility] PT [phone]

## 2012-01-27 ENCOUNTER — Encounter (HOSPITAL_COMMUNITY): Payer: Self-pay

## 2012-01-27 ENCOUNTER — Inpatient Hospital Stay: Payer: Managed Care, Other (non HMO) | Admitting: Internal Medicine

## 2012-01-27 LAB — BASIC METABOLIC PANEL
CO2: 30 mEq/L (ref 19–32)
Calcium: 8 mg/dL — ABNORMAL LOW (ref 8.4–10.5)
Creatinine, Ser: 1.41 mg/dL — ABNORMAL HIGH (ref 0.50–1.35)
GFR calc non Af Amer: 53 mL/min — ABNORMAL LOW (ref >90)
Glucose, Bld: 106 mg/dL — ABNORMAL HIGH (ref 70–99)

## 2012-01-27 MED ORDER — POTASSIUM CHLORIDE CRYS ER 20 MEQ PO TBCR
20.0000 meq | EXTENDED_RELEASE_TABLET | Freq: Once | ORAL | Status: AC
  Administered 2012-01-27: 20 meq via ORAL
  Filled 2012-01-27: qty 1

## 2012-01-27 MED ORDER — ENSURE COMPLETE PO LIQD
237.0000 mL | Freq: Two times a day (BID) | ORAL | Status: DC
  Administered 2012-01-27 – 2012-01-28 (×2): 237 mL via ORAL

## 2012-01-27 MED ORDER — POTASSIUM CHLORIDE CRYS ER 20 MEQ PO TBCR
80.0000 meq | EXTENDED_RELEASE_TABLET | Freq: Once | ORAL | Status: AC
  Administered 2012-01-27: 80 meq via ORAL
  Filled 2012-01-27: qty 4

## 2012-01-27 MED ORDER — FUROSEMIDE 10 MG/ML IJ SOLN
40.0000 mg | Freq: Once | INTRAMUSCULAR | Status: AC
  Administered 2012-01-27: 40 mg via INTRAVENOUS
  Filled 2012-01-27: qty 4

## 2012-01-27 MED ORDER — HYDROMORPHONE HCL 2 MG PO TABS
2.0000 mg | ORAL_TABLET | ORAL | Status: DC | PRN
  Administered 2012-01-27 – 2012-01-28 (×3): 2 mg via ORAL
  Filled 2012-01-27 (×3): qty 1

## 2012-01-27 NOTE — Progress Notes (Signed)
K 3.1 this AM; MD paged to make aware; pt had few beats of V-tach overnight while sleeping; will await callback.

## 2012-01-27 NOTE — Progress Notes (Signed)
Nutrition Follow-up  Intervention:    Ensure Complete twice daily (350 kcals, 13 gm protein per 8 fl oz bottle) RD to follow for nutrition care plan  Assessment:   Patient transferred to 2000. States his appetite "isn't very good" today. PO intake 50-100% per flowsheet records. Resource Breeze and Prostat liquid protein supplements discontinued 8/28 -- patient was not taking. He is amenable to chocolate Ensure Complete supplement -- RD to order.  Diet Order:  Regular  Meds: Scheduled Meds:   . atorvastatin  10 mg Oral QHS  . docusate sodium  100 mg Oral Daily  . fentaNYL  50 mcg Transdermal Q72H  . ferrous sulfate  325 mg Oral TID WC  . furosemide      . furosemide  40 mg Intravenous Once  . meropenem (MERREM) IV  2 g Intravenous Q8H  . metoprolol succinate  25 mg Oral QHS  . multivitamin with minerals  1 tablet Oral q morning - 10a  . pantoprazole  40 mg Oral Q1200  . potassium chloride  20 mEq Oral Once  . potassium chloride  20 mEq Oral Once  . potassium chloride  80 mEq Oral Once  . sodium chloride  10-40 mL Intracatheter Q12H  . vancomycin  1,250 mg Intravenous Q24H   Continuous Infusions:  PRN Meds:.acetaminophen, acetaminophen, acetaminophen, albuterol, diphenhydrAMINE, hydrALAZINE, HYDROmorphone, labetalol, metoprolol, morphine injection, naloxone, ondansetron, sodium chloride, sodium chloride, DISCONTD: fentaNYL, DISCONTD: HYDROmorphone  Labs:  CMP     Component Value Date/Time   NA 136 01/27/2012 0545   K 3.1* 01/27/2012 0545   CL 102 01/27/2012 0545   CO2 30 01/27/2012 0545   GLUCOSE 106* 01/27/2012 0545   GLUCOSE 96 05/26/2006 1118   BUN 13 01/27/2012 0545   CREATININE 1.41* 01/27/2012 0545   CALCIUM 8.0* 01/27/2012 0545   PROT 4.7* 01/23/2012 0315   ALBUMIN 2.0* 01/23/2012 0315   AST 21 01/23/2012 0315   ALT 13 01/23/2012 0315   ALKPHOS 56 01/23/2012 0315   BILITOT 1.4* 01/23/2012 0315   GFRNONAA 53* 01/27/2012 0545   GFRAA 61* 01/27/2012 0545     Intake/Output  Summary (Last 24 hours) at 01/27/12 1210 Last data filed at 01/27/12 1159  Gross per 24 hour  Intake   1260 ml  Output   4252 ml  Net  -2992 ml    Weight Status:  123.1 kg (8/25) -- no new weight available  Estimated needs: 2200-2400 kcals, 120-130 gm protein   Nutrition Dx: Increased Nutrient Needs r/t post-op healing as evidenced by re-estimated nutrition needs, ongoing   Goal: Oral intake with meals & supplements to meet >/= 90% of estimated nutrition needs, progressing   Monitor: PO & supplemental intake, weight, labs, I/O's   Kirkland Hun, RD, LDN  Pager #: [phone]  After-Hours Pager #: [phone]

## 2012-01-27 NOTE — Progress Notes (Signed)
PATIENT SLEEPING AND HAD SHORT RUN OF V-TACH [id number]. CONT. TO MONITOR Patient and rhythm.

## 2012-01-27 NOTE — Progress Notes (Signed)
New orders obtained for K now and to repeat dose at 1400 today.

## 2012-01-27 NOTE — Progress Notes (Signed)
Temp 99.5; pt given 650mg  PO Tylenol at this time; will cont. To monitor.

## 2012-01-27 NOTE — Progress Notes (Signed)
[facility] [place]  Subjective  - Still eating some, pain control improving  Objective 122/79 84 98.4 F (36.9 C) (Oral) 16 95%  Intake/Output Summary (Last 24 hours) at 01/27/12 0901 Last data filed at 01/27/12 1478  Gross per 24 hour  Intake   1260 ml  Output   2802 ml  Net  -1542 ml    Abdomen incision healing Still with some peripheral edema  Assessment/Planning: Change dilaudid to 2 mg q4h Plan for d/c tomorrow on antibiotics as outlined by ID Hypokalemia replete Continue to diurese today Acute blood loss anemia on iron  FIELDS,CHARLES E 01/27/2012 9:01 AM --  Laboratory Lab Results:  [facility] 01/25/12 0605  WBC 4.1  HGB 8.1*  HCT 24.5*  PLT 174   BMET  Basename 01/27/12 0545 01/26/12 0500 01/25/12 0605  NA 136 -- 139  K 3.1* -- 3.9  CL 102 -- 105  CO2 30 -- 28  GLUCOSE 106* -- 113*  BUN 13 -- 17  CREATININE 1.41* 1.41* --  CALCIUM 8.0* -- 8.4    COAG Lab Results  Component Value Date   INR 1.56* 01/21/2012   INR 1.78* 01/21/2012   INR 1.16 01/18/2012   No results found for this basename: PTT    Antibiotics Anti-infectives     Start     Dose/Rate Route Frequency Ordered Stop   01/26/12 1100   vancomycin (VANCOCIN) 1,250 mg in sodium chloride 0.9 % 250 mL IVPB        1,250 mg 166.7 mL/hr over 90 Minutes Intravenous Every 24 hours 01/26/12 1018     01/21/12 2200   rifampin (RIFADIN) 600 mg in sodium chloride 0.9 % 100 mL IVPB  Status:  Discontinued        600 mg 200 mL/hr over 30 Minutes Intravenous Every 24 hours 01/21/12 1702 01/24/12 1726   01/21/12 1630   cefUROXime (ZINACEF) 1.5 g in dextrose 5 % 50 mL IVPB  Status:  Discontinued        1.5 g 100 mL/hr over 30 Minutes Intravenous Every 12 hours 01/21/12 1619 01/21/12 1645   01/18/12 2200   rifampin (RIFADIN) capsule 300 mg  Status:  Discontinued        300 mg Oral Every 12 hours 01/18/12 1356 01/21/12 1619   01/18/12 1600   meropenem (MERREM) 1 g in  sodium chloride 0.9 % 100 mL IVPB  Status:  Discontinued        1 g 200 mL/hr over 30 Minutes Intravenous Every 8 hours 01/18/12 1518 01/18/12 1537   01/18/12 1600   meropenem (MERREM) 2 g in sodium chloride 0.9 % 100 mL IVPB        2 g 200 mL/hr over 30 Minutes Intravenous Every 8 hours 01/18/12 1547     01/18/12 1500   vancomycin (VANCOCIN) IVPB 1000 mg/200 mL premix  Status:  Discontinued        1,000 mg 200 mL/hr over 60 Minutes Intravenous Every 8 hours 01/18/12 1351 01/18/12 1447   01/18/12 1500   meropenem (MERREM) 2 g in sodium chloride 0.9 % 100 mL IVPB  Status:  Discontinued        2 g 200 mL/hr over 30 Minutes Intravenous Every 8 hours 01/18/12 1354 01/18/12 1517   01/18/12 1500   vancomycin (VANCOCIN) 1,250 mg in sodium chloride 0.9 % 250 mL IVPB  Status:  Discontinued        1,250 mg 166.7 mL/hr over 90 Minutes Intravenous  Every 8 hours 01/18/12 1448 01/24/12 1351   01/18/12 1400   meropenem (MERREM) 2 g in sodium chloride 0.9 % 100 mL IVPB  Status:  Discontinued        2 g 200 mL/hr over 30 Minutes Intravenous 3 times per day 01/18/12 1152 01/18/12 1354   01/18/12 1300   rifampin (RIFADIN) capsule 300 mg  Status:  Discontinued        300 mg Oral 2 times daily 01/18/12 1152 01/18/12 1356   01/18/12 1200   vancomycin (VANCOCIN) IVPB 1000 mg/200 mL premix  Status:  Discontinued        1,000 mg 200 mL/hr over 60 Minutes Intravenous Every 8 hours 01/18/12 1152 01/18/12 1351

## 2012-01-27 NOTE — Progress Notes (Signed)
Dr. Darrick Penna paged to clarify K doses today; MD stated to give additional that was ordered this AM per Dr. Edilia Bo; MD also informed of pts temp 99.5 at this time; will cont. To monitor.

## 2012-01-27 NOTE — Progress Notes (Signed)
Pt up ambulating independently in hallway without difficultly; will cont. To monitor.

## 2012-01-27 NOTE — Progress Notes (Signed)
Pt up ambulating in hallway independently at this time without difficulty; will cont. To monitor.

## 2012-01-28 LAB — BASIC METABOLIC PANEL
BUN: 12 mg/dL (ref 6–23)
Calcium: 8.1 mg/dL — ABNORMAL LOW (ref 8.4–10.5)
GFR calc Af Amer: 58 mL/min — ABNORMAL LOW (ref >90)
GFR calc non Af Amer: 50 mL/min — ABNORMAL LOW (ref >90)
Glucose, Bld: 104 mg/dL — ABNORMAL HIGH (ref 70–99)

## 2012-01-28 MED ORDER — POTASSIUM CHLORIDE CRYS ER 20 MEQ PO TBCR: 20.0000 meq | EXTENDED_RELEASE_TABLET | Freq: Every day | ORAL | Status: DC

## 2012-01-28 MED ORDER — RIFAMPIN 300 MG PO CAPS: 300.0000 mg | ORAL_CAPSULE | Freq: Two times a day (BID) | ORAL | Status: DC

## 2012-01-28 MED ORDER — ENSURE COMPLETE PO LIQD: 237.0000 mL | Freq: Two times a day (BID) | ORAL | Status: DC

## 2012-01-28 MED ORDER — FENTANYL 50 MCG/HR TD PT72: 1.0000 | MEDICATED_PATCH | TRANSDERMAL | Status: DC

## 2012-01-28 MED ORDER — SODIUM CHLORIDE 0.9 % IV SOLN: 2.0000 g | Freq: Three times a day (TID) | INTRAVENOUS | Status: DC

## 2012-01-28 MED ORDER — HYDROMORPHONE HCL 2 MG PO TABS: 2.0000 mg | ORAL_TABLET | ORAL | Status: DC | PRN

## 2012-01-28 MED ORDER — HYDROMORPHONE HCL 2 MG PO TABS: 2.0000 mg | ORAL_TABLET | ORAL | Status: AC | PRN

## 2012-01-28 MED ORDER — VANCOMYCIN HCL 1000 MG IV SOLR: 1250.0000 mg | INTRAVENOUS | Status: DC

## 2012-01-28 MED ORDER — ACETAMINOPHEN 500 MG PO TABS: 500.0000 mg | ORAL_TABLET | Freq: Four times a day (QID) | ORAL | Status: DC | PRN

## 2012-01-28 MED ORDER — HEPARIN SOD (PORK) LOCK FLUSH 100 UNIT/ML IV SOLN
250.0000 [IU] | INTRAVENOUS | Status: AC | PRN
  Administered 2012-01-28: 250 [IU]

## 2012-01-28 MED FILL — Lactated Ringer's Solution: INTRAVENOUS | Qty: 1000 | Status: AC

## 2012-01-28 NOTE — Progress Notes (Addendum)
[facility] addendum:  [facility] will draw Vanc trough and CR in am if pt goes home Slight bump in CR today 1.41-1.47 DW Care mgmnt and Pharmacist ( er: vanc dose, Vanc Trough 40.6 on 8/26 with random level at 17.6 on 8/28)  Dr Daiva Eves to follow labs as out pt. Plan antibiotics thru October 4th. Weekly CBC, bmet and Vancomycin troughs to Dr. Daiva Eves after discharge  Will DW Dr. Darrick Penna re: DC today with labs tomorrow vs keeping overnight to check labs in am  1:11 PM DW Dr. Darrick Penna - Pt. Can go home . HH RN to page Dr. Imogene Burn [phone] over weekend re lab results/ Vanc Trough and CR Discussed with Care Mngmnt - Crystal Sharol Harness who will let [facility] know

## 2012-01-28 NOTE — Discharge Summary (Addendum)
[facility] Discharge Summary   Patient ID:  Alfred [name] MRN: [id number] DOB/AGE: Oct 16, 1950 61 y.o.  Admit date: 01/18/2012 Discharge date: 01/28/2012 Date of Surgery: 01/18/2012 - 01/21/2012 Surgeon: Surgeon(s): Sherren Kerns, MD Chuck Hint, MD  Admission Diagnosis: aortic graft infection AAA;INFECTED STENT GRAFT  Discharge Diagnoses:  aortic graft infection AAA;INFECTED STENT GRAFT  Secondary Diagnoses: Past Medical History  Diagnosis Date  . Dyslipidemia   . Left lumbar radiculopathy   . Hyperplasia of prostate without lower urinary tract symptoms (LUTS)   . Other and unspecified hyperlipidemia   . Hx of adenomatous colonic polyps 2008  . HTN (hypertension)     unspecified essential  . CAD (coronary artery disease)     DES to RCA and CFX; Dr. Excell Seltzer cardiologist  . AAA (abdominal aortic aneurysm) 08/2011  . Anginal pain   . Arthritis     "knees; not bad"  . Depression ~ 2009    'treated for ~ 6 months"    Procedure(s): REMOVAL OF ABDOMINAL AORTIC STENT GRAFT ANEURYSM ABDOMINAL AORTIC REPAIR  Discharged Condition: good  HPI:  Alfred [name] is a 61 y.o. male who presents for evaluation of fever. He had EVAR stent graft for AAA repasir in April of 2013. This is his 3rd Hosp for fever. He has been on antibiotics with suspected stent graft infection. He has been having fever 99-100 evenings. Today was 101 in the morning. Some flank pain controlled with pain meds. This is no worse. He was admitted for removal of stent graft and open repair of AAA.   Hospital Course:  Alfred [name] is a 61 y.o. male is S/P Removal of infected Gore Excluder stent graft, aorto biiliac graft with cadaveric cryopreserved aorta Extubated: POD # 1 Post-op wounds healing well Pt. Ambulating, voiding and taking PO diet without difficulty. Pt pain controlled with PO pain meds. Labs as below Complications: asymptomatic acute blood loss anemia - stable on  iron supplements All cultures have been negative.pt has been on IV vancomycin and meropenum Vanc trough and CR have been followed closely He will be D/C with weekly Vanc trough and CR levels which will be followed by Dr. Daiva Eves  Consults: Dr. Daiva Eves    Significant Diagnostic Studies: CBC Lab Results  Component Value Date   WBC 4.1 01/25/2012   HGB 8.1* 01/25/2012   HCT 24.5* 01/25/2012   MCV 88.1 01/25/2012   PLT 174 01/25/2012    BMET    Component Value Date/Time   NA 136 01/28/2012 0500   K 3.6 01/28/2012 0500   CL 102 01/28/2012 0500   CO2 29 01/28/2012 0500   GLUCOSE 104* 01/28/2012 0500   GLUCOSE 96 05/26/2006 1118   BUN 12 01/28/2012 0500   CREATININE 1.47* 01/28/2012 0500   CALCIUM 8.1* 01/28/2012 0500   GFRNONAA 50* 01/28/2012 0500   GFRAA 58* 01/28/2012 0500   COAG Lab Results  Component Value Date   INR 1.56* 01/21/2012   INR 1.78* 01/21/2012   INR 1.16 01/18/2012     Disposition:  Discharge to :Home Discharge Orders    Future Appointments: Provider: Department: Dept Phone: Center:   02/21/2012 4:15 PM Randall Hiss, MD [facility] [phone] [facility]   04/13/2012 12:00 PM [facility] [phone] [facility]   04/13/2012 1:00 PM Sherren Kerns, MD [facility] [phone] [facility]     Future Orders Please Complete By Expires   Resume previous diet  Driving Restrictions      Comments:   No driving for 4 weeks   Lifting restrictions      Comments:   No lifting for 6 weeks   Call MD for:  temperature >100.5      Call MD for:  redness, tenderness, or signs of infection (pain, swelling, bleeding, redness, odor or green/yellow discharge around incision site)      Call MD for:  severe or increased pain, loss or decreased feeling  in affected limb(s)      ABDOMINAL PROCEDURE/ANEURYSM REPAIR/AORTO-BIFEMORAL BYPASS:  Call MD for increased abdominal pain; cramping diarrhea; nausea/vomiting      may wash over wound with mild soap and  water      No dressing needed      Increase activity slowly      Comments:   Walk with assistance use walker or cane as needed   May shower       Scheduling Instructions:   Dry wounds well   Discharge patient      Comments:   Discharge pt to home      [place], [place]  Home Medication Instructions [id number]   Printed on:01/28/12 1313  Medication Information                    aspirin 81 MG tablet Take 81 mg by mouth at bedtime.            clopidogrel (PLAVIX) 75 MG tablet Take 75 mg by mouth every morning.            metoprolol succinate (TOPROL-XL) 100 MG 24 hr tablet Take 100 mg by mouth at bedtime. Take with or immediately following a meal.           losartan-hydrochlorothiazide (HYZAAR) 100-25 MG per tablet Take 1 tablet by mouth at bedtime.            atorvastatin (LIPITOR) 10 MG tablet Take 10 mg by mouth at bedtime.            Multiple Vitamin (MULTIVITAMIN WITH MINERALS) TABS Take 1 tablet by mouth every morning.           Heparin Sodium, Porcine, (SASH KIT IJ) Inject as directed.           sodium chloride 0.9 % SOLN 100 mL with meropenem 1 G SOLR 2 g Inject 2 g into the vein every 8 (eight) hours.           atorvastatin (LIPITOR) 10 MG tablet Take 10 mg by mouth daily.           fentaNYL (DURAGESIC - DOSED MCG/HR) 50 MCG/HR Place 1 patch onto the skin every 3 (three) days.           atorvastatin (LIPITOR) 10 MG tablet TAKE 1 TABLET (10 MG TOTAL) BY MOUTH DAILY.           acetaminophen (TYLENOL) 500 MG tablet Take 1 tablet (500 mg total) by mouth every 6 (six) hours as needed. For pain/fever           feeding supplement (ENSURE COMPLETE) LIQD Take 237 mLs by mouth 2 (two) times daily between meals.           HYDROmorphone (DILAUDID) 2 MG tablet Take 1 tablet (2 mg total) by mouth every 4 (four) hours as needed.           sodium chloride 0.9 % SOLN 100 mL with meropenem 1 G SOLR 2 g Inject  2 g into the vein every 8 (eight) hours.             potassium chloride SA (K-DUR,KLOR-CON) 20 MEQ tablet Take 1 tablet (20 mEq total) by mouth daily.           sodium chloride 0.9 % SOLN 250 mL with vancomycin 1000 MG SOLR 1,250 mg Inject 1,250 mg into the vein daily.           HYDROmorphone (DILAUDID) 2 MG tablet Take 1 tablet (2 mg total) by mouth every 4 (four) hours as needed. For pain           fentaNYL (DURAGESIC - DOSED MCG/HR) 50 MCG/HR Place 1 patch (50 mcg total) onto the skin every 3 (three) days.            Verbal and written Discharge instructions given to the patient. Wound care per Discharge AVS   Signed: Marlowe Shores 01/28/2012, 1:13 PM    Pt feeling better today Abdominal incision healing Will recheck creatinine as outpt Needs CT angio at followup in a few weeks D/c Home  Fabienne Bruns, MD [facility] [place] Office: [phone] Pager: [phone]

## 2012-01-28 NOTE — Progress Notes (Signed)
[facility] Progress Note  01/28/2012 7:15 AM POD 7  Subjective:  Feeling pretty good this am.  Concerned about regular BM's after d/c-he is having regular BM's now.  Tm 99.5 now 98.4  VSS otherwise    97%RA Filed Vitals:   01/28/12 0539  BP: 127/84  Pulse: 89  Temp: 98.4 F (36.9 C)  Resp: 18     Physical Exam: Cardiac:  RRR Lungs:  CTAB Abdomen:  Soft NT/ND + BS  +BM Incisions:  C/d/i with staples in tact. Extremities:  Edema improved this am.  CBC    Component Value Date/Time   WBC 4.1 01/25/2012 0605   RBC 2.78* 01/25/2012 0605   HGB 8.1* 01/25/2012 0605   HCT 24.5* 01/25/2012 0605   PLT 174 01/25/2012 0605   MCV 88.1 01/25/2012 0605   MCH 29.1 01/25/2012 0605   MCHC 33.1 01/25/2012 0605   RDW 15.4 01/25/2012 0605   LYMPHSABS 0.6* 01/21/2012 1800   MONOABS 1.1* 01/21/2012 1800   EOSABS 0.0 01/21/2012 1800   BASOSABS 0.0 01/21/2012 1800    BMET    Component Value Date/Time   NA 136 01/28/2012 0500   K 3.6 01/28/2012 0500   CL 102 01/28/2012 0500   CO2 29 01/28/2012 0500   GLUCOSE 104* 01/28/2012 0500   GLUCOSE 96 05/26/2006 1118   BUN 12 01/28/2012 0500   CREATININE 1.47* 01/28/2012 0500   CALCIUM 8.1* 01/28/2012 0500   GFRNONAA 50* 01/28/2012 0500   GFRAA 58* 01/28/2012 0500    INR    Component Value Date/Time   INR 1.56* 01/21/2012 1600   All final Cx are no growth.  Intake/Output Summary (Last 24 hours) at 01/28/12 0715 Last data filed at 01/28/12 0542  Gross per 24 hour  Intake    840 ml  Output   5203 ml  Net  -4363 ml     Assessment/Plan:  61 y.o. male is s/p Removal of infected Gore Excluder stent graft, aorto biiliac graft with cadaveric cryopreserved aorta   POD 7  -pt had low grade fever yesterday afternoon and improved with Tylenol. -pt creatinine up slightly from last lab drab from 1.41 to 1.47- ? Adjustment of ABx. -case management for home health ABx. -will prepare discharge meds and papers after pharmacy has adjusted IV ABx  if needed. -pt needing less pain medication-pain is controlled. -f/u with Dr. Darrick Penna in one week for staple removal. -will start Plavix back on discharge.  Doreatha Massed, PA-C [facility] [phone] 01/28/2012 7:15 AM

## 2012-02-04 ENCOUNTER — Telehealth: Payer: Self-pay | Admitting: Vascular Surgery

## 2012-02-04 NOTE — Telephone Encounter (Signed)
Gates Rigg, pts wife called to let us know that [place] will miss a dose of antibiotics today due to [facility] not delivering medications. Gates Rigg, and the [facility] Sarah, had called and notified [facility] several times regarding antibiotic delivery, and somewhere the ball was dropped. Gates Rigg has spoken with the manager and the antibiotics will be delivered today, however he missed his 7:00 am dose. Gates Rigg just wanted Dr Darrick Penna to know the situation and for it to be documented in Alfred [name]' chart.  Alfred Danford would also like a call back from our triage nurse regarding his PICC line, it is running slowly and he is concerned. He can be reached at  [phone] or the home number listed.

## 2012-02-07 ENCOUNTER — Telehealth: Payer: Self-pay

## 2012-02-07 NOTE — Telephone Encounter (Signed)
See telephone triage note re: pt's complaints as reported per [facility] RN today.  Dr. Darrick Penna was notified.

## 2012-02-07 NOTE — Telephone Encounter (Signed)
PC to pt. On Fri, 02/04/12.  C/o PICC line running slower after the 1st of 2 bags of Vancomycin.  Stated he was flushing it frequently with NS, and reported he has had to flush PICC line with Heparin in between doses. Also pt. inquired about his follow-up appt with Dr. Darrick Penna.  States he has staples in place that need to be removed.  Called Home care RN, Huntley Dec, on 02/04/12, and advised her of pt's complaints about the PICC line running slower, and informed her that pt.is flushing line frequently.  Huntley Dec stated she will contact the pharmacy  About the PICC line.   Attempted to page Dr. Darrick Penna on 02/04/12 to inquire about pt's follow-up appt.  No return call on 02/04/12.

## 2012-02-07 NOTE — Telephone Encounter (Signed)
HH RN called to report pt. C/o not feeling well today; c/o dizziness.  Reports BP 118/80 laying and sitting, and BP 110/70 standing; and pulse rate 88 laying and sitting;  pulse rate 100 standing.  Voiced concern about getting staples removed/ and stated hasn't had a follow up appt. scheduled with Dr. Darrick Penna.   Discussed w/ Dr. Darrick Penna.  Advised to schedule pt's CTA Abd./ pelvis and office appt. this week on 02/10/12.   Phone call to pt. Spoke w/ wife.  Wife verbalized extreme dissatisfaction with the pharmacy at [facility].  Verb. not getting the proper amount of IV antibiotic supply, and that she has requested to speak to a manager, and hasn't received a call back from [facility].  Voiced concern about pt. having a low grade temperature, and stated " I'm not happy about this".  Wife wants to be sure it is clear that her dissatisfaction is with the pharmacy, and NOT with their [facility] RN, Huntley Dec.  States that the RN has been "excellent" in attempting to resolve the problem with the pharmacy, and in providing nursing care to her husband.  Informed wife of scheduled appt. For CTA with [facility] on 02/10/12 at 12:30 pm, and with Dr. Darrick Penna @ 1:30 pm.  Instructed that pt. should not eat anything solid, 4 hrs prior to CTA.  Advised he can drink fluids prior to CTA.  Wife verb. Understanding of instructions.

## 2012-02-08 ENCOUNTER — Encounter: Payer: Self-pay | Admitting: Vascular Surgery

## 2012-02-08 ENCOUNTER — Telehealth: Payer: Self-pay

## 2012-02-08 ENCOUNTER — Ambulatory Visit (INDEPENDENT_AMBULATORY_CARE_PROVIDER_SITE_OTHER): Payer: Managed Care, Other (non HMO) | Admitting: Vascular Surgery

## 2012-02-08 VITALS — BP 98/69 | HR 96 | Temp 98.3°F | Ht 74.0 in | Wt 231.0 lb

## 2012-02-08 DIAGNOSIS — I714 Abdominal aortic aneurysm, without rupture, unspecified: Secondary | ICD-10-CM

## 2012-02-08 NOTE — Telephone Encounter (Signed)
Alfred [name], PAR scheduled patient to see TFE today at 2:45pm- she spoke with Gates Rigg, patients wife to inform, dpm

## 2012-02-08 NOTE — Telephone Encounter (Signed)
Received phone call from pt's. Wife.  Reports continued dizziness x 2 days, noticeable worsening in weakness the past 3 days.  States " he is not progressing."   Requesting that "between Dr. Imogene Burn, Dr. Darrick Penna, and Dr. Daiva Eves, something needs to happen."  Stated that the home care RN called to report pt's BP yesterday, and feels that nothing is being done.  States that pt. has lost 30 # since he has been sick, and concerned about the strength of the blood pressure medication. Wife stated that she feels that the post op issues should be addressed, and that his cardiologist, "Dr. Excell Seltzer has signed-off on his medications".  Advised wife, will contact Dr. Darrick Penna for further recommendations.

## 2012-02-08 NOTE — Telephone Encounter (Signed)
Spoke w/ Dr. Darrick Penna about pt's symptoms/ wife's concerns.  Recommended to bring pt. To office today for evaluation.

## 2012-02-08 NOTE — Progress Notes (Signed)
The patient presents today requesting to be seen for generalized weakness, dizziness and low blood pressure. He is status post removal of a aortic stent graft and replacement of a cadaver graft on 8/23 with an extensive operation. He did quite well the hospital. Main complaint is of the typical fatigue after open aortic surgery. His blood pressure has been running 100 110 systolic and he describes relatively typical orthostatic hypotension with dizziness.  Physical exam he is alert oriented in no distress. His abdomen is completely healed and the staples are removed today. He has 2+ femoral and popliteal pulses bilaterally and no erythema and no abdominal tenderness.  I reassured the patient and his wife and he is having typical fatigue expected after major aortic surgery. I do not see any focal issues regarding this. He is concern regarding his antihypertensive regimen since he has lost a total of 50 pounds since his initial surgery. I did discuss this by telephone with Dr. Tonny Bollman who will see him on Thursday9/12. He is also scheduled to have a CT scan followup for his removal of his stent graft and will be contacted by Dr. Darrick Penna regarding the finding of these.

## 2012-02-10 ENCOUNTER — Ambulatory Visit (INDEPENDENT_AMBULATORY_CARE_PROVIDER_SITE_OTHER): Payer: Managed Care, Other (non HMO) | Admitting: Cardiovascular Disease

## 2012-02-10 ENCOUNTER — Ambulatory Visit: Payer: Managed Care, Other (non HMO) | Admitting: Vascular Surgery

## 2012-02-10 ENCOUNTER — Ambulatory Visit
Admission: RE | Admit: 2012-02-10 | Discharge: 2012-02-10 | Disposition: A | Discharge status: Home / Self Care | Payer: Managed Care, Other (non HMO) | Source: Ambulatory Visit | Attending: Physician Assistant | Admitting: Physician Assistant

## 2012-02-10 ENCOUNTER — Encounter: Payer: Self-pay | Admitting: Cardiovascular Disease

## 2012-02-10 ENCOUNTER — Other Ambulatory Visit: Payer: Managed Care, Other (non HMO)

## 2012-02-10 ENCOUNTER — Telehealth: Payer: Self-pay | Admitting: Vascular Surgery

## 2012-02-10 VITALS — BP 111/70 | HR 83 | Ht 72.0 in | Wt 231.0 lb

## 2012-02-10 DIAGNOSIS — I714 Abdominal aortic aneurysm, without rupture, unspecified: Secondary | ICD-10-CM

## 2012-02-10 DIAGNOSIS — E785 Hyperlipidemia, unspecified: Secondary | ICD-10-CM

## 2012-02-10 DIAGNOSIS — I2510 Atherosclerotic heart disease of native coronary artery without angina pectoris: Secondary | ICD-10-CM

## 2012-02-10 DIAGNOSIS — Z48812 Encounter for surgical aftercare following surgery on the circulatory system: Secondary | ICD-10-CM

## 2012-02-10 MED ORDER — METOPROLOL SUCCINATE ER 50 MG PO TB24: 50.0000 mg | ORAL_TABLET | Freq: Every day | ORAL | Status: DC

## 2012-02-10 MED ORDER — IOHEXOL 350 MG/ML SOLN
100.0000 mL | Freq: Once | INTRAVENOUS | Status: AC | PRN
  Administered 2012-02-10: 100 mL via INTRAVENOUS

## 2012-02-10 MED ORDER — LOSARTAN POTASSIUM 50 MG PO TABS: 50.0000 mg | ORAL_TABLET | Freq: Every day | ORAL | Status: DC

## 2012-02-10 NOTE — Telephone Encounter (Signed)
Spoke with pt regarding CT angio today.  Aortic graft looks good.  Anastomoses patent with no pseudoaneurysm.  Omental wrap looks good.  Slightly decreased uptake left kidney but renal vein was ligated at time of operation for exposure.  Gonadal and adrenal veins did opacify with contrast today.  Pt has follow up with me in November.  He will finish his antibiotics as per ID.  He will need repeat CTA in one year.  Fabienne Bruns, MD [facility] [place] Office: [phone] Pager: [phone]

## 2012-02-10 NOTE — Progress Notes (Signed)
HPI:  61 year old gentleman presenting for followup evaluation.  He has recently had complex open surgery involving removal of a AAA stent graft on August 23 and replacement with a cadaveric graft. He's been feeling weak and dizzy and then was called by Dr. Arbie Cookey with vascular surgery about evaluating him for adjustment of his cardiac medications. The patient complains of generalized weakness. He feels dizzy when he first wakes up in the morning. He has not had frank syncope, but does complain of positional lightheadedness. He's not had a good appetite, nor has he been pushing fluids. He's lost 50 pounds over the past several months. He denies chest pain or pressure, palpitations, dyspnea, orthopnea, PND, or recurrent abdominal pain. He's had no recent fever or chills.  Outpatient Encounter Prescriptions as of 02/10/2012  Medication Sig Dispense Refill  . acetaminophen (TYLENOL) 500 MG tablet Take 1 tablet (500 mg total) by mouth every 6 (six) hours as needed. For pain/fever  30 tablet    . aspirin 81 MG tablet Take 81 mg by mouth at bedtime.       Marland Kitchen atorvastatin (LIPITOR) 10 MG tablet TAKE 1 TABLET (10 MG TOTAL) BY MOUTH DAILY.  30 tablet  9  . clopidogrel (PLAVIX) 75 MG tablet Take 75 mg by mouth every morning.       . feeding supplement (ENSURE COMPLETE) LIQD Take 237 mLs by mouth 2 (two) times daily between meals.      . Heparin Sodium, Porcine, (SASH KIT IJ) Inject as directed.      Marland Kitchen losartan-hydrochlorothiazide (HYZAAR) 100-25 MG per tablet Take 1 tablet by mouth at bedtime.       . metoprolol succinate (TOPROL-XL) 100 MG 24 hr tablet Take 100 mg by mouth at bedtime. Take with or immediately following a meal.      . Multiple Vitamin (MULTIVITAMIN WITH MINERALS) TABS Take 1 tablet by mouth every morning.      . potassium chloride SA (K-DUR,KLOR-CON) 20 MEQ tablet Take 1 tablet (20 mEq total) by mouth daily.      . sodium chloride 0.9 % SOLN 100 mL with meropenem 1 G SOLR 2 g Inject 2 g into  the vein every 8 (eight) hours.  102 Units  0  . sodium chloride 0.9 % SOLN 250 mL with vancomycin 1000 MG SOLR Inject 1,500 mg into the vein daily.      Marland Kitchen DISCONTD: sodium chloride 0.9 % SOLN 250 mL with vancomycin 1000 MG SOLR 1,250 mg Inject 1,250 mg into the vein daily.  60 Units  1  . DISCONTD: atorvastatin (LIPITOR) 10 MG tablet Take 10 mg by mouth at bedtime.       Marland Kitchen DISCONTD: fentaNYL (DURAGESIC - DOSED MCG/HR) 50 MCG/HR Place 1 patch (50 mcg total) onto the skin every 3 (three) days.  5 patch  0  . DISCONTD: HYDROmorphone (DILAUDID) 2 MG tablet Take 1 tablet (2 mg total) by mouth every 4 (four) hours as needed. For pain  40 tablet  0  . DISCONTD: sodium chloride 0.9 % SOLN 100 mL with meropenem 1 G SOLR 2 g Inject 2 g into the vein every 8 (eight) hours.  21 application  6   Facility-Administered Encounter Medications as of 02/10/2012  Medication Dose Route Frequency Provider Last Rate Last Dose  . iohexol (OMNIPAQUE) 350 MG/ML injection 100 mL  100 mL Intravenous Once PRN Medication Radiologist, MD   100 mL at 02/10/12 1337    No Known Allergies  Past Medical  History  Diagnosis Date  . Dyslipidemia   . Left lumbar radiculopathy   . Hyperplasia of prostate without lower urinary tract symptoms (LUTS)   . Other and unspecified hyperlipidemia   . Hx of adenomatous colonic polyps 2008  . HTN (hypertension)     unspecified essential  . CAD (coronary artery disease)     DES to RCA and CFX; Dr. Excell Seltzer cardiologist  . AAA (abdominal aortic aneurysm) 08/2011  . Anginal pain   . Arthritis     "knees; not bad"  . Depression ~ 2009    'treated for ~ 6 months"    ROS: Negative except as per HPI  BP 111/70  Pulse 83  Ht 6' (1.829 m)  Wt 104.781 kg (231 lb)  BMI 31.33 kg/m2  PHYSICAL EXAM: Pt is alert and oriented, NAD. He is much thinner since I have seen him last. HEENT: normal Neck: JVP - normal, carotids 2+= without bruits Lungs: CTA bilaterally CV: RRR without murmur or  gallop Abd: soft, NT, Positive BS, midline scar healing well Ext: no C/C/E, distal pulses intact and equal Skin: warm/dry no rash  EKG:  Normal sinus rhythm with sinus arrhythmia heart rate 83 beats per minute.  ASSESSMENT AND PLAN: 1. Abdominal aortic aneurysm status post stent grafting and subsequent stent graft removal with open surgical repair. Per vascular surgery. 2. Hypertension. The patient has lost a great deal of weight and he is recovering from major surgery. His antihypertensive needs have decreased and I recommended he reduce his metoprolol succinate 50 mg daily, discontinue losartan/HCTZ altogether, and start losartan alone at a decreased dose of 50 mg daily. 3. Followup: I will see him back in about 8 weeks. I have recommended that he start monitoring his blood pressure regularly and to call and if his blood pressure rebounds to greater than 140/90.  Tonny Bollman 02/10/2012 2:34 PM

## 2012-02-10 NOTE — Patient Instructions (Addendum)
Your physician recommends that you schedule a follow-up appointment in: 8 WEEKS with Dr Excell Seltzer  Your physician has requested that you regularly monitor and record your blood pressure readings at home. Please use the same machine at the same time of day to check your readings and record them to bring to your follow-up visit.  Your physician has recommended you make the following change in your medication: STOP Losartan HCT, START Losartan 50mg  take one by mouth daily, DECREASE Metoprolol Succinate to 50mg  take one by mouth daily

## 2012-02-21 ENCOUNTER — Ambulatory Visit (INDEPENDENT_AMBULATORY_CARE_PROVIDER_SITE_OTHER): Payer: Managed Care, Other (non HMO) | Admitting: Infectious Disease

## 2012-02-21 ENCOUNTER — Ambulatory Visit: Payer: Managed Care, Other (non HMO) | Admitting: Infectious Disease

## 2012-02-21 ENCOUNTER — Encounter: Payer: Self-pay | Admitting: Infectious Disease

## 2012-02-21 VITALS — BP 112/79 | HR 98 | Temp 98.0°F | Ht 72.0 in | Wt 236.0 lb

## 2012-02-21 DIAGNOSIS — R109 Unspecified abdominal pain: Secondary | ICD-10-CM

## 2012-02-21 DIAGNOSIS — R209 Unspecified disturbances of skin sensation: Secondary | ICD-10-CM

## 2012-02-21 DIAGNOSIS — T827XXA Infection and inflammatory reaction due to other cardiac and vascular devices, implants and grafts, initial encounter: Secondary | ICD-10-CM

## 2012-02-21 DIAGNOSIS — R238 Other skin changes: Secondary | ICD-10-CM

## 2012-02-21 DIAGNOSIS — R6883 Chills (without fever): Secondary | ICD-10-CM

## 2012-02-21 MED ORDER — SACCHAROMYCES BOULARDII 250 MG PO CAPS: 250.0000 mg | ORAL_CAPSULE | Freq: Two times a day (BID) | ORAL | Status: DC

## 2012-02-21 NOTE — Assessment & Plan Note (Signed)
Not clear what cause is. Has occurred post-surgically

## 2012-02-21 NOTE — Progress Notes (Signed)
Subjective:    Patient ID: Alfred [name], male    DOB: Jun 22, 1950, 61 y.o.   MRN: [id number]  HPI  Alfred [name] is a 61 y.o. male with infected AAA graft with abscess who had failed attempts to control his infection with protracted IV antibiotics most recently with IV vancomycin and meropenemsp removal of AAA graft, and placement of cadaveric graft on 01/21/12 by Dr Darrick Penna.   CT scan done earlier this month showed:  "removal of the infrarenal bifurcated aortic  stent graft with placement of an aortobi-iliac graft. Mesenteric  fat has been placed around the graft within the native aortic  aneurysm sac. Interval decrease in periaortic retroperitoneal  inflammatory/edematous changes"  He has had some problems with feeling cold esp in his hands after surgery. He is without fevers, chillls, nausea. He does have some dyspepsia and inquired about probiotics.  His incision is well healed. His vancomycin was stopped yesterday and he missed one day of meropenem. I HAD WANTED HIM ON BOTH VANCOMYCIN AND MEROPENEM THRU 03/09/12 and we called [facility] to ensure that that course was still followed.   Review of Systems  Constitutional: Positive for chills. Negative for fever, diaphoresis, activity change, appetite change, fatigue and unexpected weight change.  HENT: Negative for congestion, sore throat, rhinorrhea, sneezing, trouble swallowing and sinus pressure.   Eyes: Negative for photophobia and visual disturbance.  Respiratory: Negative for cough, chest tightness, shortness of breath, wheezing and stridor.   Cardiovascular: Negative for chest pain, palpitations and leg swelling.  Gastrointestinal: Negative for nausea, vomiting, abdominal pain, diarrhea, constipation, blood in stool, abdominal distention and anal bleeding.  Genitourinary: Negative for dysuria, hematuria, flank pain and difficulty urinating.  Musculoskeletal: Negative for myalgias, back pain, joint swelling, arthralgias and gait  problem.  Skin: Positive for wound. Negative for color change, pallor and rash.  Neurological: Negative for dizziness, tremors, weakness and light-headedness.  Hematological: Negative for adenopathy. Does not bruise/bleed easily.  Psychiatric/Behavioral: Negative for behavioral problems, confusion, disturbed wake/sleep cycle, dysphoric mood, decreased concentration and agitation.       Objective:   Physical Exam  Constitutional: He is oriented to person, place, and time. He appears well-developed and well-nourished. No distress.  HENT:  Head: Normocephalic and atraumatic.  Mouth/Throat: Oropharynx is clear and moist. No oropharyngeal exudate.  Eyes: Conjunctivae normal and EOM are normal. Pupils are equal, round, and reactive to light. No scleral icterus.  Neck: Normal range of motion. Neck supple. No JVD present.  Cardiovascular: Normal rate, regular rhythm and normal heart sounds.   No murmur heard. Pulmonary/Chest: Effort normal and breath sounds normal. No respiratory distress. He has no wheezes. He has no rales. He exhibits no tenderness.  Abdominal: He exhibits no distension and no mass. There is no tenderness. There is no rebound and no guarding.  Musculoskeletal: He exhibits no edema and no tenderness.  Lymphadenopathy:    He has no cervical adenopathy.  Neurological: He is alert and oriented to person, place, and time. He has normal reflexes. He exhibits normal muscle tone. Coordination normal.  Skin: Skin is warm and dry. He is not diaphoretic. No erythema. No pallor.     Psychiatric: He has a normal mood and affect. His behavior is normal. Judgment and thought content normal.          Assessment & Plan:  Infected Aortic Graft Sp removal of graft and placment of cadaveric graft. Cultures ON abx were negative. We will finish 6 weeks of postoperative vancomycin and meropenem  on 03/03/12 and see him back in [facility] 2 weeks after finishing abx and examine him and draw  surveillance cultures  Cold hands Not clear what cause is. Has occurred post-surgically

## 2012-02-21 NOTE — Assessment & Plan Note (Signed)
Sp removal of graft and placment of cadaveric graft. Cultures ON abx were negative. We will finish 6 weeks of postoperative vancomycin and meropenem on 03/03/12 and see him back in [facility] 2 weeks after finishing abx and examine him and draw surveillance cultures

## 2012-03-15 ENCOUNTER — Encounter: Payer: Self-pay | Admitting: Gastroenterology

## 2012-03-20 ENCOUNTER — Encounter: Payer: Self-pay | Admitting: Infectious Disease

## 2012-03-20 ENCOUNTER — Ambulatory Visit (INDEPENDENT_AMBULATORY_CARE_PROVIDER_SITE_OTHER): Payer: Managed Care, Other (non HMO) | Admitting: Infectious Disease

## 2012-03-20 VITALS — BP 113/79 | HR 61 | Temp 97.7°F | Ht 72.0 in | Wt 242.0 lb

## 2012-03-20 DIAGNOSIS — Z8679 Personal history of other diseases of the circulatory system: Secondary | ICD-10-CM

## 2012-03-20 DIAGNOSIS — K651 Peritoneal abscess: Secondary | ICD-10-CM

## 2012-03-20 DIAGNOSIS — Z9889 Other specified postprocedural states: Secondary | ICD-10-CM

## 2012-03-20 DIAGNOSIS — T827XXA Infection and inflammatory reaction due to other cardiac and vascular devices, implants and grafts, initial encounter: Secondary | ICD-10-CM

## 2012-03-20 NOTE — Progress Notes (Signed)
Subjective:    Patient ID: Alfred [name], male    DOB: 1951/02/02, 61 y.o.   MRN: [id number]  HPI  Alfred [name] is a 61 y.o. male with infected AAA graft with abscess who had failed attempts to control his infection with protracted IV antibiotics most recently with IV vancomycin and meropenemsp removal of AAA graft, and placement of cadaveric graft on 01/21/12 by Dr Darrick Penna.   CT scan done earlier this month showed:  "removal of the infrarenal bifurcated aortic  stent graft with placement of an aortobi-iliac graft. Mesenteric  fat has been placed around the graft within the native aortic  aneurysm sac. Interval decrease in periaortic retroperitoneal  inflammatory/edematous changes"   He has had some problems with feeling cold esp in his hands after surgery. He is without fevers, chillls, nausea. He does have some dyspepsia and inquired about probiotics.  His incision is well healed. His vancomycin was stopped yesterday and he missed one day of meropenem. I HAD WANTED HIM ON BOTH VANCOMYCIN AND MEROPENEM THRU 03/09/12 and we called [facility] to ensure that that course was still followed.   He has now completed his course. He is without fevers, nausea or malaise. He has only occasional operative site pain.  He had questions regarding his fleeting numbness in his left leg and scrotum, deferred to vascular surgery. He again had questions about whether or not whatever no worrisome cause this I again reiterated we would not know unless it recurred. I speculated on oral oral flora versus skin flora that could have gotten into the bloodstream and infected the site either at the time of surgery or postoperatively.       Review of Systems  Constitutional: Negative for fever, chills, diaphoresis, activity change, appetite change, fatigue and unexpected weight change.  HENT: Negative for congestion, sore throat, rhinorrhea, sneezing, trouble swallowing and sinus pressure.   Eyes: Negative for  photophobia and visual disturbance.  Respiratory: Negative for cough, chest tightness, shortness of breath, wheezing and stridor.   Cardiovascular: Negative for chest pain, palpitations and leg swelling.  Gastrointestinal: Negative for nausea, vomiting, abdominal pain, diarrhea, constipation, blood in stool, abdominal distention and anal bleeding.  Genitourinary: Negative for dysuria, hematuria, flank pain and difficulty urinating.  Musculoskeletal: Negative for myalgias, back pain, joint swelling, arthralgias and gait problem.  Skin: Negative for color change, pallor, rash and wound.  Neurological: Negative for dizziness, tremors, weakness and light-headedness.  Hematological: Negative for adenopathy. Does not bruise/bleed easily.  Psychiatric/Behavioral: Negative for behavioral problems, confusion, disturbed wake/sleep cycle, dysphoric mood, decreased concentration and agitation.       Objective:   Physical Exam  Constitutional: He is oriented to person, place, and time. He appears well-developed and well-nourished. No distress.  HENT:  Head: Normocephalic and atraumatic.  Mouth/Throat: Oropharynx is clear and moist. No oropharyngeal exudate.  Eyes: Conjunctivae normal and EOM are normal.  Neck: Normal range of motion. Neck supple.  Cardiovascular: Normal rate, regular rhythm and normal heart sounds.   Pulmonary/Chest: Effort normal and breath sounds normal. No respiratory distress. He has no wheezes.  Abdominal: He exhibits no distension and no mass. There is no tenderness. There is no rebound and no guarding.  Musculoskeletal: He exhibits no edema and no tenderness.  Lymphadenopathy:    He has no cervical adenopathy.  Neurological: He is alert and oriented to person, place, and time. He exhibits normal muscle tone. Coordination normal.  Skin: Skin is warm and dry. He is not diaphoretic. No erythema.  No pallor.     Psychiatric: He has a normal mood and affect. His behavior is  normal. Judgment and thought content normal.          Assessment & Plan:   Infected aortic graft: is post resection of prosthetic material in placement of cavitary graft. Status post IV antibiotics with meropenem and vancomycin. We'll check surveillance blood cultures x2 sites today.  Intra-abdominal abscess: Do to prosthetic graft infection, by CT scanthis had resolved on imaging in September.

## 2012-03-22 ENCOUNTER — Encounter: Payer: Self-pay | Admitting: *Deleted

## 2012-03-26 LAB — CULTURE, BLOOD (SINGLE)
Organism ID, Bacteria: NO GROWTH
Organism ID, Bacteria: NO GROWTH

## 2012-04-06 ENCOUNTER — Ambulatory Visit: Payer: Managed Care, Other (non HMO) | Admitting: Cardiovascular Disease

## 2012-04-12 ENCOUNTER — Other Ambulatory Visit: Payer: Self-pay | Admitting: Cardiovascular Disease

## 2012-04-12 ENCOUNTER — Encounter: Payer: Self-pay | Admitting: Vascular Surgery

## 2012-04-13 ENCOUNTER — Ambulatory Visit (INDEPENDENT_AMBULATORY_CARE_PROVIDER_SITE_OTHER): Payer: Managed Care, Other (non HMO) | Admitting: Vascular Surgery

## 2012-04-13 ENCOUNTER — Encounter: Payer: Self-pay | Admitting: Vascular Surgery

## 2012-04-13 ENCOUNTER — Other Ambulatory Visit: Payer: Managed Care, Other (non HMO)

## 2012-04-13 VITALS — BP 117/70 | HR 79 | Resp 20 | Ht 72.0 in | Wt 253.0 lb

## 2012-04-13 DIAGNOSIS — I714 Abdominal aortic aneurysm, without rupture, unspecified: Secondary | ICD-10-CM

## 2012-04-13 NOTE — Addendum Note (Signed)
Addended by: Sharee Pimple on: 04/13/2012 02:11 PM   Modules accepted: Orders

## 2012-04-13 NOTE — Progress Notes (Addendum)
[facility] Progress Note  04/13/2012 1:35 PM   Subjective:  3 months post-op removal of for Excluder AAA graft, and placement of cadaveric aortic graft on 01/21/12 by Dr Darrick Penna. He has more good days than bad days at this point and he is working full time.  His appetite is returning. His exercise tolerance is also returning. He denies any fever or chills.      Filed Vitals:   04/13/12 1304  BP: 117/70  Pulse: 79  Resp: 20     Physical Exam:  Palp PT pulses bilaterally, non palp DP on the left without change from previous exam. 2+ DP pulse right Incision is well healed without tenderness to palpation. No evidence of hernia     Assessment/Plan:  61 y.o. male is s/p removal of AAA graft, and placement of cadaveric graft on 01/21/12.   He can increase his activity as tolerates. CTA Abdomen and pelvis in 9 months, that will be 1 year from his surgery. He will also see me in followup at that time    Mosetta Pigeon, PA-C [facility] [phone] 04/13/2012 1:35 PM    History and exam details as above. Patient recovering from previously infected aortic stent graft now replaced with cadaveric aortic graft. Followup CTA in 9 months.  Fabienne Bruns, MD [facility] [place] Office: [phone] Pager: [phone]

## 2012-04-20 ENCOUNTER — Ambulatory Visit (INDEPENDENT_AMBULATORY_CARE_PROVIDER_SITE_OTHER): Payer: Managed Care, Other (non HMO) | Admitting: Cardiovascular Disease

## 2012-04-20 ENCOUNTER — Encounter: Payer: Self-pay | Admitting: Cardiovascular Disease

## 2012-04-20 VITALS — BP 122/84 | HR 62 | Ht 72.0 in | Wt 252.0 lb

## 2012-04-20 DIAGNOSIS — I2510 Atherosclerotic heart disease of native coronary artery without angina pectoris: Secondary | ICD-10-CM

## 2012-04-20 DIAGNOSIS — I10 Essential (primary) hypertension: Secondary | ICD-10-CM

## 2012-04-20 NOTE — Progress Notes (Signed)
HPI:  [name] is a 61 year old gentleman returning for followup evaluation. The patient has coronary artery disease and underwent stenting of the right coronary artery several years ago. He has been followed for an abdominal aortic aneurysm and underwent stent graft repair this year. This was complicated by an infection and he underwent open AAA repair with a cadaveric graft in August. He is now doing very well. He had lost a great deal of weight and his blood pressure medicines had to be adjusted as he developed symptomatic hypotension. Blood pressures have been in a better range and he is feeling better at present. He denies chest pain, chest pressure, dyspnea, lightheadedness, or syncope.  Outpatient Encounter Prescriptions as of 04/20/2012  Medication Sig Dispense Refill  . acetaminophen (TYLENOL) 500 MG tablet Take 1 tablet (500 mg total) by mouth every 6 (six) hours as needed. For pain/fever  30 tablet    . aspirin 81 MG tablet Take 81 mg by mouth at bedtime.       Marland Kitchen atorvastatin (LIPITOR) 10 MG tablet TAKE 1 TABLET (10 MG TOTAL) BY MOUTH DAILY.  30 tablet  9  . clopidogrel (PLAVIX) 75 MG tablet Take 75 mg by mouth every morning.       . famotidine (PEPCID) 20 MG tablet Take 20 mg by mouth daily.      Marland Kitchen losartan (COZAAR) 50 MG tablet Take 1 tablet (50 mg total) by mouth daily.  30 tablet  6  . metoprolol succinate (TOPROL-XL) 50 MG 24 hr tablet Take 1 tablet (50 mg total) by mouth at bedtime. Take with or immediately following a meal.  30 tablet  6  . Multiple Vitamin (MULTIVITAMIN WITH MINERALS) TABS Take 1 tablet by mouth every morning.      . [DISCONTINUED] clopidogrel (PLAVIX) 75 MG tablet TAKE 1 TABLET BY MOUTH EVERY DAY  30 tablet  9  . [DISCONTINUED] feeding supplement (ENSURE COMPLETE) LIQD Take 237 mLs by mouth 2 (two) times daily between meals.      . [DISCONTINUED] Heparin Sodium, Porcine, (SASH KIT IJ) Inject as directed.      . [DISCONTINUED] potassium chloride SA  (K-DUR,KLOR-CON) 20 MEQ tablet Take 1 tablet (20 mEq total) by mouth daily.      . [DISCONTINUED] saccharomyces boulardii (FLORASTOR) 250 MG capsule Take 1 capsule (250 mg total) by mouth 2 (two) times daily.  60 capsule  1  . [DISCONTINUED] sodium chloride 0.9 % SOLN 100 mL with meropenem 1 G SOLR 2 g Inject 2 g into the vein every 8 (eight) hours.  102 Units  0  . [DISCONTINUED] sodium chloride 0.9 % SOLN 250 mL with vancomycin 1000 MG SOLR Inject 1,500 mg into the vein daily.        No Known Allergies  Past Medical History  Diagnosis Date  . Dyslipidemia   . Left lumbar radiculopathy   . Hyperplasia of prostate without lower urinary tract symptoms (LUTS)   . Other and unspecified hyperlipidemia   . Hx of adenomatous colonic polyps 2008  . HTN (hypertension)     unspecified essential  . CAD (coronary artery disease)     DES to RCA and CFX; Dr. Excell Seltzer cardiologist  . AAA (abdominal aortic aneurysm) 08/2011  . Anginal pain   . Arthritis     "knees; not bad"  . Depression ~ 2009    'treated for ~ 6 months"    ROS: Negative except as per HPI  BP 122/84  Pulse 62  Ht 6' (1.829 m)  Wt 114.306 kg (252 lb)  BMI 34.18 kg/m2  SpO2 98%  PHYSICAL EXAM: Pt is alert and oriented, NAD HEENT: normal Neck: JVP - normal, carotids 2+= without bruits Lungs: CTA bilaterally CV: RRR without murmur or gallop Abd: soft, NT, Positive BS, no hepatomegaly. Abdominal incision looks good. Ext: no C/C/E, distal pulses intact and equal Skin: warm/dry no rash  ASSESSMENT AND PLAN: 1. Coronary artery disease, native vessel. The patient is stable without anginal symptoms. He will remain on his current medical program and followup in 6 months.  2. Hypertension. Blood pressure appears well-controlled on a combination of losartan and metoprolol succinate. I am not going to start his diuretic back unless he develops elevated blood pressures again.  3. Hyperlipidemia. Lipids from February were reviewed  and he was at goal on low-dose atorvastatin. He will have a followup lipid panel before he returns again in 6 months.  Tonny Bollman 04/20/2012 10:04 AM

## 2012-04-20 NOTE — Patient Instructions (Addendum)
Your physician wants you to follow-up in: 6 MONTHS with Dr Excell Seltzer.  You will receive a reminder letter in the mail two months in advance. If you don't receive a letter, please call our office to schedule the follow-up appointment.  Your physician recommends that you return for a FASTING LIPID, LIVER and BMP in 6 MONTHS--nothing to eat or drink after midnight, lab opens at 7:30. This can be drawn one week prior to your next appointment.   Your physician recommends that you continue on your current medications as directed. Please refer to the Current Medication list given to you today.

## 2012-07-15 ENCOUNTER — Other Ambulatory Visit: Payer: Self-pay

## 2012-09-20 ENCOUNTER — Other Ambulatory Visit: Payer: Self-pay | Admitting: Cardiovascular Disease

## 2012-11-15 ENCOUNTER — Other Ambulatory Visit: Payer: Self-pay | Admitting: Cardiovascular Disease

## 2012-11-23 ENCOUNTER — Other Ambulatory Visit: Payer: Self-pay | Admitting: Cardiovascular Disease

## 2012-11-28 ENCOUNTER — Other Ambulatory Visit: Payer: Self-pay | Admitting: Cardiovascular Disease

## 2013-01-10 ENCOUNTER — Other Ambulatory Visit: Payer: Self-pay | Admitting: Cardiovascular Disease

## 2013-01-15 ENCOUNTER — Other Ambulatory Visit (INDEPENDENT_AMBULATORY_CARE_PROVIDER_SITE_OTHER): Payer: Managed Care, Other (non HMO)

## 2013-01-15 DIAGNOSIS — I10 Essential (primary) hypertension: Secondary | ICD-10-CM

## 2013-01-15 DIAGNOSIS — I2510 Atherosclerotic heart disease of native coronary artery without angina pectoris: Secondary | ICD-10-CM

## 2013-01-15 LAB — HEPATIC FUNCTION PANEL
ALT: 39 U/L (ref 0–53)
Alkaline Phosphatase: 67 U/L (ref 39–117)
Bilirubin, Direct: 0 mg/dL (ref 0.0–0.3)
Total Bilirubin: 1 mg/dL (ref 0.3–1.2)
Total Protein: 7 g/dL (ref 6.0–8.3)

## 2013-01-15 LAB — BASIC METABOLIC PANEL
BUN: 17 mg/dL (ref 6–23)
Chloride: 102 mEq/L (ref 96–112)
Creatinine, Ser: 1.1 mg/dL (ref 0.4–1.5)
Glucose, Bld: 95 mg/dL (ref 70–99)
Potassium: 4.2 mEq/L (ref 3.5–5.1)

## 2013-01-15 LAB — LIPID PANEL
HDL: 36.5 mg/dL — ABNORMAL LOW (ref >39.00)
VLDL: 40.2 mg/dL — ABNORMAL HIGH (ref 0.0–40.0)

## 2013-01-16 ENCOUNTER — Other Ambulatory Visit: Payer: Self-pay | Admitting: Cardiovascular Disease

## 2013-01-16 ENCOUNTER — Encounter: Payer: Self-pay | Admitting: Internal Medicine

## 2013-01-16 ENCOUNTER — Ambulatory Visit (INDEPENDENT_AMBULATORY_CARE_PROVIDER_SITE_OTHER): Payer: Managed Care, Other (non HMO) | Admitting: Internal Medicine

## 2013-01-16 VITALS — BP 140/80 | HR 66 | Temp 98.1°F | Ht 72.0 in | Wt 284.2 lb

## 2013-01-16 DIAGNOSIS — I2510 Atherosclerotic heart disease of native coronary artery without angina pectoris: Secondary | ICD-10-CM

## 2013-01-16 DIAGNOSIS — Z9889 Other specified postprocedural states: Secondary | ICD-10-CM

## 2013-01-16 DIAGNOSIS — Z0000 Encounter for general adult medical examination without abnormal findings: Secondary | ICD-10-CM

## 2013-01-16 DIAGNOSIS — D126 Benign neoplasm of colon, unspecified: Secondary | ICD-10-CM

## 2013-01-16 DIAGNOSIS — Z1331 Encounter for screening for depression: Secondary | ICD-10-CM

## 2013-01-16 LAB — CBC WITH DIFFERENTIAL/PLATELET
Basophils Absolute: 0 10*3/uL (ref 0.0–0.1)
Basophils Relative: 0.7 % (ref 0.0–3.0)
Eosinophils Absolute: 0.2 10*3/uL (ref 0.0–0.7)
Lymphocytes Relative: 36.4 % (ref 12.0–46.0)
MCHC: 34.3 g/dL (ref 30.0–36.0)
Neutrophils Relative %: 50.7 % (ref 43.0–77.0)
RBC: 3.93 Mil/uL — ABNORMAL LOW (ref 4.22–5.81)

## 2013-01-16 NOTE — Patient Instructions (Addendum)
The most common cause of elevated triglycerides (TG) is the ingestion of sugar from high fructose corn syrup sources added to processed foods & drinks.  Eat a low-fat diet with lots of fruits and vegetables, up to 7-9 servings per day. Consume less than 40  Grams (preferably ZERO) of sugar per day from foods & drinks with High Fructose Corn Syrup (HFCS) sugar as #1,2,3 or # 4 on label.Whole [employer], [employer] & [employer] do not carry products with HFCS. As per the Standard of Care , screening Colonoscopy recommended @ 50 & every 5-10 years thereafter . More frequent monitor would be dictated by family history or findings @ Colonoscopy such as polyps. Please review the medication list in the After Visit Summary provided.Please verify the medication name (this may be  brand or generic) & correct dosage. Write the name of the prescribing physician to the right of the medication and share this with all medical staff seen at each appointment. This will help provide continuity of care; help optimize therapeutic interventions;and help prevent drug:drug adverse reaction.

## 2013-01-16 NOTE — Progress Notes (Signed)
  Subjective:    Patient ID: Alfred [name], male    DOB: [birth year], 62 y.o.   MRN: [id number]  HPI  He is here for a physical;acute issues include residual  @ L superior , medial groin/ thigh with L testicular swelling since AAA surgery.     Review of Systems  He is not on a heart healthy diet; he is very physically active w/o a regular exercise program 7 times per week without symptoms. Specifically he denies chest pain, palpitations, dyspnea, or claudication.  Family history is negative for premature coronary disease. Because of his CAD ; his LDL goal is less than 70 ideally. BP not monitored @ home.    Objective:   Physical Exam Gen.:  well-nourished in appearance. Alert, appropriate and cooperative throughout exam. Head: Normocephalic without obvious abnormalities; pattern alopecia . Beard & moustache Eyes: No corneal or conjunctival inflammation noted.  Extraocular motion intact. Vision grossly normal with lenses Ears: External  ear exam reveals no significant lesions or deformities. Canals clear .TMs normal. Hearing is grossly normal bilaterally. Nose: External nasal exam reveals no deformity or inflammation. Nasal mucosa are pink and moist. No lesions or exudates noted.   Mouth: Oral mucosa and oropharynx reveal no lesions or exudates. Teeth in good repair. Neck: No deformities, masses, or tenderness noted. Range of motion & Thyroid normal. Lungs: Normal respiratory effort; chest expands symmetrically. Lungs are clear to auscultation without rales, wheezes, or increased work of breathing. Heart: Normal rate and rhythm. Normal S1 and S2. No gallop, click, or rub. S4 w/o murmur. Abdomen: Bowel sounds normal; abdomen soft and nontender. No masses, organomegaly or hernias noted. Genitalia: Genitalia normal except for large left varices & hydrocoele . Prostate is normal without enlargement, asymmetry, nodularity, or induration but habitus limits exam.                               Musculoskeletal/extremities: No deformity or scoliosis noted of  the thoracic or lumbar spine.  No clubbing, cyanosis, edema, or significant extremity  deformity noted. Range of motion normal .Tone & strength  Normal. Joints normal . Nail health good. Able to lie down & sit up w/o help. Negative SLR bilaterally Vascular: Carotid, radial artery, dorsalis pedis and  posterior tibial pulses are  equal. Decreased DPP.No bruits present. Neurologic: Alert and oriented x3. Deep tendon reflexes symmetrical and 1/2+ @ knees.        Skin: Intact without suspicious lesions or rashes. Solar changes Lymph: No cervical, axillary, or inguinal lymphadenopathy present. Psych: Mood and affect are normal. Normally interactive                                                                                        Assessment & Plan:  #1 comprehensive physical exam; no acute findings  Plan: see Orders  & Recommendations

## 2013-01-17 ENCOUNTER — Encounter: Payer: Self-pay | Admitting: Vascular Surgery

## 2013-01-18 ENCOUNTER — Ambulatory Visit (INDEPENDENT_AMBULATORY_CARE_PROVIDER_SITE_OTHER): Payer: Managed Care, Other (non HMO) | Admitting: Vascular Surgery

## 2013-01-18 ENCOUNTER — Ambulatory Visit
Admission: RE | Admit: 2013-01-18 | Discharge: 2013-01-18 | Disposition: A | Discharge status: Home / Self Care | Payer: Managed Care, Other (non HMO) | Source: Ambulatory Visit | Attending: Vascular Surgery | Admitting: Vascular Surgery

## 2013-01-18 ENCOUNTER — Encounter: Payer: Self-pay | Admitting: Vascular Surgery

## 2013-01-18 VITALS — BP 136/94 | HR 59 | Ht 72.0 in | Wt 282.0 lb

## 2013-01-18 DIAGNOSIS — I714 Abdominal aortic aneurysm, without rupture, unspecified: Secondary | ICD-10-CM

## 2013-01-18 DIAGNOSIS — Z48812 Encounter for surgical aftercare following surgery on the circulatory system: Secondary | ICD-10-CM

## 2013-01-18 MED ORDER — IOHEXOL 350 MG/ML SOLN
80.0000 mL | Freq: Once | INTRAVENOUS | Status: AC | PRN
  Administered 2013-01-18: 80 mL via INTRAVENOUS

## 2013-01-18 NOTE — Progress Notes (Signed)
[facility] Progress Note  Subjective:  3 months post-op removal of for Excluder AAA graft, and placement of cadaveric aortic graft on 01/21/12. He has gained weight and he is working full time. His exercise tolerance is at baseline. He denies any fever or chills.   Current Outpatient Prescriptions on File Prior to Visit  Medication Sig Dispense Refill  . acetaminophen (TYLENOL) 500 MG tablet Take 1 tablet (500 mg total) by mouth every 6 (six) hours as needed. For pain/fever  30 tablet    . aspirin 81 MG tablet Take 81 mg by mouth at bedtime.       Marland Kitchen atorvastatin (LIPITOR) 10 MG tablet TAKE 1 TABLET (10 MG TOTAL) BY MOUTH DAILY.  30 tablet  2  . clopidogrel (PLAVIX) 75 MG tablet Take 75 mg by mouth every morning.       . famotidine (PEPCID) 20 MG tablet Take 20 mg by mouth daily as needed.       Marland Kitchen losartan (COZAAR) 50 MG tablet TAKE 1 TABLET BY MOUTH EVERY DAY  30 tablet  6  . metoprolol succinate (TOPROL-XL) 50 MG 24 hr tablet TAKE 1 TABLET (50 MG TOTAL) BY MOUTH AT BEDTIME. TAKE WITH OR IMMEDIATELY FOLLOWING A MEAL.  30 tablet  1  . Multiple Vitamin (MULTIVITAMIN WITH MINERALS) TABS Take 1 tablet by mouth every morning.       No current facility-administered medications on file prior to visit.   Review of systems: He denies shortness of breath. He denies chest pain.   Physical Exam:  Filed Vitals:   01/18/13 1134  BP: 136/94  Pulse: 59  Height: 6' (1.829 m)  Weight: 282 lb (127.914 kg)  SpO2: 99%    Palp PT pulses bilaterally, non palp DP on the left without change from previous exam. 2+ DP pulse right Incision is well healed without tenderness to palpation. No evidence of hernia  Data: CT angiogram the abdomen and pelvis was performed today. This shows a patent cataract aorta homograft with no evidence of aneurysmal degeneration. No perianastomotic disruption. No evidence of infection.  Assessment/Plan:  62 y.o. male is s/p removal of Gore Excluder AAA stent  graft, and placement of cadaveric graft on 01/21/12.    He can increase his activity as tolerates. He will try to lose some weight at this point. CTA Abdomen and pelvis in one year   Fabienne Bruns, MD [facility] [place] Office: [phone] Pager: [phone]  VASCULAR QUALITY INITIATIVE FOLLOW UP DATA: Current smoker: [  ] yes  [ x ] no  Living status: [x  ]  Home  [  ] Nursing home  [  ] Homeless    MEDS:  ASA [ x ] yes  [  ] no- [  ] medical reason  [  ] non compliant  STATIN  [ x ] yes  [  ] no- [  ] medical reason  [  ] non compliant  Beta blocker [ x ] yes  [  ] no- [  ] medical reason  [  ] non compliant  ACE inhibitor [  ] yes  [ x ] no- [ x ] medical reason  [  ] non compliant  P2Y12 Antagonist [  ] none  [ x ] clopidogrel-Plavix  [  ] ticlopidine-Ticlid   [  ] prasugrel-Effient  [  ] ticagrelor- Brilinta    Anticoagulant [ x ] None  [  ] warfarin  [  ]  rivaroxaban-Xarelto [  ] dabigatran- Pradaxa  Current Max AAA = 20 mm  Endoleak:  [  ] no  [  ] yes- [  ] type I  [  ] type II  [  ] type III  [  ] indeterminate  Number of new interventions: -   Date:      Why:  [  ] endoleak  [  ] limb occlusion   [  ] growth  [  ]  Migration   [  ] symtomatic/ rupture  Infection converted to cadaveric aorta with graft explant 01/21/12  Conversion to open repair: [ x ] yes   [  ] no   Date: 01/21/12  Other operation related to EVAR:  [  ] yes   [  ] no

## 2013-01-19 ENCOUNTER — Ambulatory Visit (INDEPENDENT_AMBULATORY_CARE_PROVIDER_SITE_OTHER): Payer: Managed Care, Other (non HMO) | Admitting: Cardiovascular Disease

## 2013-01-19 ENCOUNTER — Encounter: Payer: Self-pay | Admitting: Cardiovascular Disease

## 2013-01-19 VITALS — BP 128/88 | HR 62 | Ht 72.0 in | Wt 282.0 lb

## 2013-01-19 DIAGNOSIS — I10 Essential (primary) hypertension: Secondary | ICD-10-CM

## 2013-01-19 DIAGNOSIS — I2510 Atherosclerotic heart disease of native coronary artery without angina pectoris: Secondary | ICD-10-CM

## 2013-01-19 DIAGNOSIS — E785 Hyperlipidemia, unspecified: Secondary | ICD-10-CM

## 2013-01-19 MED ORDER — LOSARTAN POTASSIUM 50 MG PO TABS: ORAL_TABLET | ORAL | Status: DC

## 2013-01-19 MED ORDER — CLOPIDOGREL BISULFATE 75 MG PO TABS: 75.0000 mg | ORAL_TABLET | Freq: Every day | ORAL | Status: DC

## 2013-01-19 MED ORDER — ATORVASTATIN CALCIUM 10 MG PO TABS: ORAL_TABLET | ORAL | Status: DC

## 2013-01-19 MED ORDER — METOPROLOL SUCCINATE ER 50 MG PO TB24: ORAL_TABLET | ORAL | Status: DC

## 2013-01-19 NOTE — Patient Instructions (Addendum)
Your physician wants you to follow-up in: 6 MONTHS with Dr Excell Seltzer.  You will receive a reminder letter in the mail two months in advance. If you don't receive a letter, please call our office to schedule the follow-up appointment.  Your physician recommends that you return for a FASTING LIPID and LIVER profile in 6 MONTHS.  Your physician recommends that you continue on your current medications as directed. Please refer to the Current Medication list given to you today.

## 2013-01-19 NOTE — Progress Notes (Signed)
HPI:   Alfred [name] is a 62 year old gentleman returning for followup evaluation. The patient has coronary artery disease and underwent stenting of the right coronary artery and LCx several years ago. He has been followed for an abdominal aortic aneurysm and underwent stent graft repair in 2013. This was complicated by an infection and he underwent open AAA repair with a cadaveric graft.he has now fully recovered. He had previously lost a lot of weight, but since his last visit here in November he has gained 30 pounds. He is back to doing vigorous work without exertional symptoms. He has not been following a diet or exercise program. He states "I've been eating and drinking way too much." He denies chest pain or pressure, abdominal pain, leg swelling, or palpitations.  Outpatient Encounter Prescriptions as of 01/19/2013  Medication Sig Dispense Refill  . acetaminophen (TYLENOL) 500 MG tablet PRN for pain/fever      . aspirin 81 MG tablet Take 81 mg by mouth at bedtime.       Marland Kitchen atorvastatin (LIPITOR) 10 MG tablet TAKE 1 TABLET (10 MG TOTAL) BY MOUTH DAILY.  30 tablet  2  . clopidogrel (PLAVIX) 75 MG tablet PRN      . famotidine (PEPCID) 20 MG tablet Take 20 mg by mouth daily as needed.       Marland Kitchen losartan (COZAAR) 50 MG tablet TAKE 1 TABLET BY MOUTH EVERY DAY  30 tablet  6  . metoprolol succinate (TOPROL-XL) 50 MG 24 hr tablet TAKE 1 TABLET (50 MG TOTAL) BY MOUTH AT BEDTIME. TAKE WITH OR IMMEDIATELY FOLLOWING A MEAL.  30 tablet  1  . Multiple Vitamin (MULTIVITAMIN WITH MINERALS) TABS NOT CONSISTANT      . [DISCONTINUED] acetaminophen (TYLENOL) 500 MG tablet Take 1 tablet (500 mg total) by mouth every 6 (six) hours as needed. For pain/fever  30 tablet     No facility-administered encounter medications on file as of 01/19/2013.    No Known Allergies  Past Medical History  Diagnosis Date  . Dyslipidemia   . Left lumbar radiculopathy   . Hyperplasia of prostate without lower urinary tract symptoms  (LUTS)   . Other and unspecified hyperlipidemia   . Hx of adenomatous colonic polyps 2008  . HTN (hypertension)     unspecified essential  . CAD (coronary artery disease)     DES to RCA and CFX; Dr. Excell Seltzer cardiologist  . AAA (abdominal aortic aneurysm) 08/2011    see Surgical history  . Anginal pain   . Arthritis     "knees; not bad"  . Depression ~ 2009    'treated for ~ 6 months"    ROS: Negative except as per HPI  BP 128/88  Pulse 62  Ht 6' (1.829 m)  Wt 282 lb (127.914 kg)  BMI 38.24 kg/m2  SpO2 96%  PHYSICAL EXAM: Pt is alert and oriented, NAD HEENT: normal Neck: JVP - normal, carotids 2+= without bruits Lungs: CTA bilaterally CV: RRR without murmur or gallop Abd: soft, NT, Positive BS, no hepatomegaly Ext: no C/C/E, distal pulses intact and equal Skin: warm/dry no rash  EKG:  Normal sinus rhythm 62 beats per minute, moderate voltage criteria for LVH, maybe normal variant.  ASSESSMENT AND PLAN: 1. Coronary artery disease, native vessel. The patient is stable without anginal symptoms. He will continue on his current medical program. He takes dual antiplatelet therapy with aspirin and Plavix as he was treated with multivessel first-generation drug-eluting stents.  2. Hypertension. He remains  on losartan and metoprolol succinate. He needs to lose weight. We had a discussion about this today.  3. Hyperlipidemia. Recent lipids reviewed. Elevated triglycerides reflect his weight gain. He is going to work on weight loss and we'll try to get back down to the to 250 pound range.  Alfred [name] 01/19/2013 11:20 AM

## 2013-03-21 ENCOUNTER — Other Ambulatory Visit: Payer: Self-pay | Admitting: Cardiology

## 2013-04-05 ENCOUNTER — Other Ambulatory Visit: Payer: Self-pay

## 2013-09-07 ENCOUNTER — Other Ambulatory Visit: Payer: Self-pay | Admitting: Cardiovascular Disease

## 2013-09-14 ENCOUNTER — Other Ambulatory Visit (INDEPENDENT_AMBULATORY_CARE_PROVIDER_SITE_OTHER): Payer: Managed Care, Other (non HMO)

## 2013-09-14 ENCOUNTER — Ambulatory Visit (INDEPENDENT_AMBULATORY_CARE_PROVIDER_SITE_OTHER): Payer: Managed Care, Other (non HMO) | Admitting: Cardiovascular Disease

## 2013-09-14 ENCOUNTER — Encounter: Payer: Self-pay | Admitting: Cardiovascular Disease

## 2013-09-14 VITALS — BP 145/91 | HR 78 | Ht 72.0 in | Wt 288.4 lb

## 2013-09-14 DIAGNOSIS — E785 Hyperlipidemia, unspecified: Secondary | ICD-10-CM

## 2013-09-14 DIAGNOSIS — I714 Abdominal aortic aneurysm, without rupture, unspecified: Secondary | ICD-10-CM

## 2013-09-14 DIAGNOSIS — I10 Essential (primary) hypertension: Secondary | ICD-10-CM

## 2013-09-14 DIAGNOSIS — I2510 Atherosclerotic heart disease of native coronary artery without angina pectoris: Secondary | ICD-10-CM

## 2013-09-14 LAB — HEPATIC FUNCTION PANEL
ALT: 58 U/L — AB (ref 0–53)
AST: 45 U/L — ABNORMAL HIGH (ref 0–37)
Albumin: 4.1 g/dL (ref 3.5–5.2)
Alkaline Phosphatase: 84 U/L (ref 39–117)
Bilirubin, Direct: 0 mg/dL (ref 0.0–0.3)
TOTAL PROTEIN: 7.2 g/dL (ref 6.0–8.3)
Total Bilirubin: 0.8 mg/dL (ref 0.3–1.2)

## 2013-09-14 LAB — LIPID PANEL
CHOLESTEROL: 164 mg/dL (ref 0–200)
HDL: 38.8 mg/dL — AB (ref >39.00)
LDL Cholesterol: 104 mg/dL — ABNORMAL HIGH (ref 0–99)
TRIGLYCERIDES: 105 mg/dL (ref 0.0–149.0)
Total CHOL/HDL Ratio: 4
VLDL: 21 mg/dL (ref 0.0–40.0)

## 2013-09-14 MED ORDER — TADALAFIL 10 MG PO TABS: 10.0000 mg | ORAL_TABLET | Freq: Every day | ORAL | Status: DC | PRN

## 2013-09-14 NOTE — Patient Instructions (Signed)
Your physician wants you to follow-up in: 6 MONTHS with Dr Burt Knack. You will receive a reminder letter in the mail two months in advance. If you don't receive a letter, please call our office to schedule the follow-up appointment.  Your physician recommends that you return for a FASTING LIPID, LIVER and BMP in 6 MONTHS--nothing to eat or drink after midnight, lab opens at 7:30 am  Your physician recommends that you continue on your current medications as directed. Please refer to the Current Medication list given to you today.  Prescription given for Cialis 10mg  to use as needed.

## 2013-09-14 NOTE — Progress Notes (Signed)
HPI:  [name] is a 63 year old gentleman returning for followup evaluation. The patient has coronary artery disease and underwent stenting of the right coronary artery and LCx several years ago. He has been followed for an abdominal aortic aneurysm and underwent stent graft repair in 2013. This was complicated by an infection and he underwent open AAA repair with a cadaveric graft.  Last lipids were drawn August 2014 with the following results: Lipid Panel     Component Value Date/Time   CHOL 135 01/15/2013 0747   TRIG 201.0* 01/15/2013 0747   HDL 36.50* 01/15/2013 0747   CHOLHDL 4 01/15/2013 0747   VLDL 40.2* 01/15/2013 0747   LDLCALC 60 07/15/2011 0907   Alfred [name] is feeling well. He denies chest pain, shortness of breath, or leg swelling. He complains of erectile dysfunction. Otherwise has no specific complaints. He gained a lot of weight over the winter months. He's been drinking more alcohol.  Outpatient Encounter Prescriptions as of 09/14/2013  Medication Sig  . acetaminophen (TYLENOL) 500 MG tablet PRN for pain/fever  . aspirin 81 MG tablet Take 81 mg by mouth at bedtime.   Marland Kitchen atorvastatin (LIPITOR) 10 MG tablet TAKE 1 TABLET (10 MG TOTAL) BY MOUTH DAILY.  Marland Kitchen clopidogrel (PLAVIX) 75 MG tablet Take 1 tablet (75 mg total) by mouth daily.  . famotidine (PEPCID) 20 MG tablet Take 20 mg by mouth daily as needed.   Marland Kitchen losartan (COZAAR) 50 MG tablet TAKE 1 TABLET BY MOUTH EVERY DAY  . metoprolol succinate (TOPROL-XL) 50 MG 24 hr tablet TAKE 1 TABLET (50 MG TOTAL) BY MOUTH AT BEDTIME. TAKE WITH OR IMMEDIATELY FOLLOWING A MEAL.  . Multiple Vitamin (MULTIVITAMIN WITH MINERALS) TABS NOT CONSISTANT  . [DISCONTINUED] metoprolol succinate (TOPROL-XL) 50 MG 24 hr tablet TAKE 1 TABLET (50 MG TOTAL) BY MOUTH AT BEDTIME. TAKE WITH OR IMMEDIATELY FOLLOWING A MEAL.    No Known Allergies  Past Medical History  Diagnosis Date  . Dyslipidemia   . Left lumbar radiculopathy   . Hyperplasia of prostate  without lower urinary tract symptoms (LUTS)   . Other and unspecified hyperlipidemia   . Hx of adenomatous colonic polyps 2008  . HTN (hypertension)     unspecified essential  . CAD (coronary artery disease)     DES to RCA and CFX; Dr. Burt Knack cardiologist  . AAA (abdominal aortic aneurysm) 08/2011    see Surgical history  . Anginal pain   . Arthritis     "knees; not bad"  . Depression ~ 2009    'treated for ~ 6 months"    ROS: Negative except as per HPI  BP 145/91  Pulse 78  Ht 6' (1.829 m)  Wt 288 lb 6.4 oz (130.817 kg)  BMI 39.11 kg/m2  PHYSICAL EXAM: Pt is alert and oriented, overweight male in NAD HEENT: normal Neck: JVP - normal, carotids 2+= without bruits Lungs: CTA bilaterally CV: RRR without murmur or gallop Abd: soft, NT, Positive BS, no hepatomegaly Ext: no C/C/E, distal pulses intact and equal Skin: warm/dry no rash  EKG:  Normal sinus rhythm 74 beats per minute, within normal limits.  ASSESSMENT AND PLAN: 1. Coronary artery disease, native vessel. The patient is stable without symptoms of angina. He will continue on his current medical program which includes aspirin, Plavix, and a statin drug. Lengthy discussion about lifestyle modification, including diet and exercise as well as reduction in alcohol intake to less than 2 drinks per day.  2. Hypertension. Blood  pressure is elevated this morning. The patient states he is "wound up" this morning. For now will work on lifestyle modification.   3. Hyperlipidemia. Patient on atorvastatin. Followup labs prior to six-month office visit area  4. Abdominal aortic aneurysm. Status post surgical repair. Followed by vascular surgery. He sees Dr. Oneida Alar again in August with follow-up CT imaging at that time.  Sherren Mocha 09/14/2013 8:13 AM

## 2013-09-18 ENCOUNTER — Other Ambulatory Visit: Payer: Self-pay

## 2013-09-18 DIAGNOSIS — E785 Hyperlipidemia, unspecified: Secondary | ICD-10-CM

## 2013-09-18 DIAGNOSIS — I2510 Atherosclerotic heart disease of native coronary artery without angina pectoris: Secondary | ICD-10-CM

## 2013-09-18 MED ORDER — ATORVASTATIN CALCIUM 10 MG PO TABS: ORAL_TABLET | ORAL | Status: DC

## 2013-10-03 ENCOUNTER — Other Ambulatory Visit: Payer: Self-pay | Admitting: Cardiovascular Disease

## 2013-10-18 ENCOUNTER — Other Ambulatory Visit: Payer: Self-pay | Admitting: Cardiovascular Disease

## 2013-11-07 ENCOUNTER — Other Ambulatory Visit: Payer: Self-pay | Admitting: Cardiovascular Disease

## 2013-11-08 ENCOUNTER — Other Ambulatory Visit: Payer: Self-pay | Admitting: Cardiovascular Disease

## 2013-12-18 ENCOUNTER — Telehealth: Payer: Self-pay

## 2013-12-18 NOTE — Telephone Encounter (Signed)
Spoke with pt to move appts up. Aware that he needs to have his bloodwork completed before CTA on 01/03/14. dpm

## 2013-12-18 NOTE — Telephone Encounter (Signed)
Pt. called to report concern for developing a hernia.  Reported he feels a protrusion in the area of the abdomen, just beneath his belt buckle, when he tightens his abdominal muscles.  Also reports some continued numbness in the (L) upper thigh area.  Stated he had some nerve damage with the previous procedure, but wanted to report the continued numbness.  Stated he had complications with the last procedure, and wanted to be evaluated sooner than later for the possible hernia.  Discussed with Dr. Oneida Alar.  Recommended to work pt. in for office visit within the next couple of weeks.

## 2013-12-21 ENCOUNTER — Other Ambulatory Visit (INDEPENDENT_AMBULATORY_CARE_PROVIDER_SITE_OTHER): Payer: Managed Care, Other (non HMO)

## 2013-12-21 DIAGNOSIS — I10 Essential (primary) hypertension: Secondary | ICD-10-CM

## 2013-12-21 DIAGNOSIS — I714 Abdominal aortic aneurysm, without rupture, unspecified: Secondary | ICD-10-CM

## 2013-12-21 DIAGNOSIS — E785 Hyperlipidemia, unspecified: Secondary | ICD-10-CM

## 2013-12-21 DIAGNOSIS — I2510 Atherosclerotic heart disease of native coronary artery without angina pectoris: Secondary | ICD-10-CM

## 2013-12-21 LAB — LIPID PANEL
CHOL/HDL RATIO: 3
Cholesterol: 111 mg/dL (ref 0–200)
HDL: 36.5 mg/dL — AB (ref >39.00)
LDL CALC: 57 mg/dL (ref 0–99)
NONHDL: 74.5
Triglycerides: 87 mg/dL (ref 0.0–149.0)
VLDL: 17.4 mg/dL (ref 0.0–40.0)

## 2013-12-21 LAB — HEPATIC FUNCTION PANEL
ALBUMIN: 3.9 g/dL (ref 3.5–5.2)
ALT: 42 U/L (ref 0–53)
AST: 35 U/L (ref 0–37)
Alkaline Phosphatase: 83 U/L (ref 39–117)
Bilirubin, Direct: 0.1 mg/dL (ref 0.0–0.3)
TOTAL PROTEIN: 6.9 g/dL (ref 6.0–8.3)
Total Bilirubin: 1 mg/dL (ref 0.2–1.2)

## 2013-12-21 LAB — BASIC METABOLIC PANEL
BUN: 18 mg/dL (ref 6–23)
CHLORIDE: 104 meq/L (ref 96–112)
CO2: 31 meq/L (ref 19–32)
Calcium: 9 mg/dL (ref 8.4–10.5)
Creatinine, Ser: 1.1 mg/dL (ref 0.4–1.5)
GFR: 69.01 mL/min (ref >60.00)
GLUCOSE: 101 mg/dL — AB (ref 70–99)
Potassium: 4.3 mEq/L (ref 3.5–5.1)
Sodium: 139 mEq/L (ref 135–145)

## 2013-12-27 ENCOUNTER — Ambulatory Visit: Payer: Managed Care, Other (non HMO) | Admitting: Vascular Surgery

## 2013-12-27 ENCOUNTER — Other Ambulatory Visit: Payer: Managed Care, Other (non HMO)

## 2014-01-02 ENCOUNTER — Encounter: Payer: Self-pay | Admitting: Vascular Surgery

## 2014-01-03 ENCOUNTER — Ambulatory Visit
Admission: RE | Admit: 2014-01-03 | Discharge: 2014-01-03 | Disposition: A | Discharge status: Home / Self Care | Payer: Managed Care, Other (non HMO) | Source: Ambulatory Visit | Attending: Vascular Surgery | Admitting: Vascular Surgery

## 2014-01-03 ENCOUNTER — Ambulatory Visit (INDEPENDENT_AMBULATORY_CARE_PROVIDER_SITE_OTHER): Payer: Managed Care, Other (non HMO) | Admitting: Vascular Surgery

## 2014-01-03 ENCOUNTER — Encounter: Payer: Self-pay | Admitting: Vascular Surgery

## 2014-01-03 VITALS — BP 128/73 | HR 75 | Resp 18 | Ht 72.0 in | Wt 291.2 lb

## 2014-01-03 DIAGNOSIS — Z48812 Encounter for surgical aftercare following surgery on the circulatory system: Secondary | ICD-10-CM

## 2014-01-03 DIAGNOSIS — I714 Abdominal aortic aneurysm, without rupture, unspecified: Secondary | ICD-10-CM

## 2014-01-03 MED ORDER — IOHEXOL 350 MG/ML SOLN
80.0000 mL | Freq: Once | INTRAVENOUS | Status: AC | PRN
  Administered 2014-01-03: 80 mL via INTRAVENOUS

## 2014-01-03 NOTE — Progress Notes (Signed)
[facility] Progress Note  Subjective:  3 months post-op removal of for Excluder AAA graft, and placement of cadaveric aortic graft on 01/21/12. He has gained more weight since his last office visit. He is working full time. His exercise tolerance is at baseline. He denies any fever or chills. He has noticed a bulge at his umbilicus recently. This is more prominent when he stresses his abdomen.     Current Outpatient Prescriptions on File Prior to Visit  Medication Sig Dispense Refill  . acetaminophen (TYLENOL) 500 MG tablet PRN for pain/fever      . aspirin 81 MG tablet Take 81 mg by mouth at bedtime.       Marland Kitchen atorvastatin (LIPITOR) 10 MG tablet TAKE 1 TABLET (10 MG TOTAL) BY MOUTH DAILY.  90 tablet  1  . clopidogrel (PLAVIX) 75 MG tablet TAKE 1 TABLET DAILY, INS ONLY PAYS FOR 30 DAY  90 tablet  1  . famotidine (PEPCID) 20 MG tablet Take 20 mg by mouth daily as needed.       Marland Kitchen losartan (COZAAR) 50 MG tablet TAKE 1 TABLET DAILY, INS ONLY PAYS FOR 30 DAY  90 tablet  1  . metoprolol succinate (TOPROL-XL) 50 MG 24 hr tablet TAKE 1 TABLET (50 MG TOTAL) BY MOUTH AT BEDTIME. TAKE WITH OR IMMEDIATELY FOLLOWING A MEAL.  90 tablet  1  . Multiple Vitamin (MULTIVITAMIN WITH MINERALS) TABS NOT CONSISTANT      . tadalafil (CIALIS) 10 MG tablet Take 1 tablet (10 mg total) by mouth daily as needed for erectile dysfunction.  10 tablet  2  . metoprolol succinate (TOPROL-XL) 50 MG 24 hr tablet TAKE 1 TABLET (50 MG TOTAL) BY MOUTH AT BEDTIME. TAKE WITH OR IMMEDIATELY FOLLOWING A MEAL.  30 tablet  5   No current facility-administered medications on file prior to visit.     Review of systems: He denies shortness of breath. He denies chest pain.    Filed Vitals:   01/03/14 1242  BP: 128/73  Pulse: 75  Resp: 18  Height: 6' (1.829 m)  Weight: 291 lb 3.2 oz (132.087 kg)    Palp femoral pulses bilaterally Incision is well healed without tenderness to palpation. There is a 2 cm hernia defect  periumbilical easily reducible  Data: CT angiogram the abdomen and pelvis was performed today. This shows a patent cataract aorta cadaveric graft with no evidence of aneurysmal degeneration. No perianastomotic disruption. No evidence of infection.  Assessment/Plan:  63 y.o. male is s/p removal of Gore Excluder AAA stent graft, and placement of cadaveric graft on 01/21/12.     He will try to lose some weight at this point.CTA Abdomen and pelvis in 2 years.  We'll referring to general surgery in the future if he wishes to have consideration for repair of his umbilical hernia.   Ruta Hinds, MD [facility] [place] Office: [phone] Pager: [phone]  VASCULAR QUALITY INITIATIVE FOLLOW UP DATA: Current smoker: [  ] yes  [ x ] no  Living status: [x  ]  Home  [  ] Nursing home  [  ] Homeless     MEDS:  ASA [ x ] yes  [  ] no- [  ] medical reason  [  ] non compliant  STATIN  [ x ] yes  [  ] no- [  ] medical reason  [  ] non compliant  Beta blocker [ x ] yes  [  ]  no- [  ] medical reason  [  ] non compliant  ACE inhibitor [  ] yes  [ x ] no- [ x ] medical reason  [  ] non compliant  P2Y12 Antagonist [  ] none  [ x ] clopidogrel-Plavix  [  ] ticlopidine-Ticlid    [  ] prasugrel-Effient  [  ] ticagrelor- Brilinta    Anticoagulant [ x ] None  [  ] warfarin  [  ] rivaroxaban-Xarelto [  ] dabigatran- Pradaxa  Current Max AAA = 20 mm  Endoleak:  [  ] no  [  ] yes- [  ] type I  [  ] type II  [  ] type III  [  ] indeterminate  Number of new interventions: -   Date:      Why:  [  ] endoleak  [  ] limb occlusion    [  ] growth  [  ]  Migration   [  ] symtomatic/ rupture  Infection converted to cadaveric aorta with graft explant 01/21/12  Conversion to open repair: [ x ] yes   [  ] no   Date: 01/21/12  Other operation related to EVAR:  [  ] yes   [  ] no

## 2014-01-24 ENCOUNTER — Other Ambulatory Visit: Payer: Managed Care, Other (non HMO)

## 2014-01-24 ENCOUNTER — Ambulatory Visit: Payer: Managed Care, Other (non HMO) | Admitting: Vascular Surgery

## 2014-03-22 ENCOUNTER — Other Ambulatory Visit: Payer: Managed Care, Other (non HMO)

## 2014-03-29 ENCOUNTER — Other Ambulatory Visit (INDEPENDENT_AMBULATORY_CARE_PROVIDER_SITE_OTHER): Payer: Managed Care, Other (non HMO) | Admitting: *Deleted

## 2014-03-29 DIAGNOSIS — I10 Essential (primary) hypertension: Secondary | ICD-10-CM

## 2014-03-29 DIAGNOSIS — E785 Hyperlipidemia, unspecified: Secondary | ICD-10-CM

## 2014-03-29 LAB — BASIC METABOLIC PANEL
BUN: 19 mg/dL (ref 6–23)
CALCIUM: 8.9 mg/dL (ref 8.4–10.5)
CO2: 24 mEq/L (ref 19–32)
Chloride: 104 mEq/L (ref 96–112)
Creatinine, Ser: 1.2 mg/dL (ref 0.4–1.5)
GFR: 66.92 mL/min (ref >60.00)
GLUCOSE: 99 mg/dL (ref 70–99)
Potassium: 4.6 mEq/L (ref 3.5–5.1)
Sodium: 136 mEq/L (ref 135–145)

## 2014-03-29 LAB — LIPID PANEL
CHOLESTEROL: 125 mg/dL (ref 0–200)
HDL: 40.9 mg/dL (ref >39.00)
LDL Cholesterol: 64 mg/dL (ref 0–99)
NonHDL: 84.1
Total CHOL/HDL Ratio: 3
Triglycerides: 101 mg/dL (ref 0.0–149.0)
VLDL: 20.2 mg/dL (ref 0.0–40.0)

## 2014-03-29 LAB — HEPATIC FUNCTION PANEL
ALBUMIN: 3.6 g/dL (ref 3.5–5.2)
ALT: 50 U/L (ref 0–53)
AST: 36 U/L (ref 0–37)
Alkaline Phosphatase: 74 U/L (ref 39–117)
BILIRUBIN TOTAL: 0.9 mg/dL (ref 0.2–1.2)
Bilirubin, Direct: 0.1 mg/dL (ref 0.0–0.3)
Total Protein: 7 g/dL (ref 6.0–8.3)

## 2014-04-05 ENCOUNTER — Ambulatory Visit (INDEPENDENT_AMBULATORY_CARE_PROVIDER_SITE_OTHER): Payer: Managed Care, Other (non HMO) | Admitting: Cardiovascular Disease

## 2014-04-05 ENCOUNTER — Encounter: Payer: Self-pay | Admitting: Cardiovascular Disease

## 2014-04-05 VITALS — BP 122/88 | HR 59 | Ht 72.0 in | Wt 294.4 lb

## 2014-04-05 DIAGNOSIS — I10 Essential (primary) hypertension: Secondary | ICD-10-CM

## 2014-04-05 DIAGNOSIS — E785 Hyperlipidemia, unspecified: Secondary | ICD-10-CM

## 2014-04-05 NOTE — Progress Notes (Signed)
Background: patient is followed for coronary artery disease, hypertension, and hyperlipidemia. His tree of abdominal aortic aneurysm and underwent endovascular repair in 2013. Because of an infected graft, he had to undergo open abdominal aortic aneurysm repair with a cadaveric graft.  HPI:  63 year old [name] presenting for follow-up evaluation. He feels well. He continues to do hard physical work. He denies chest pain, chest pressure, shortness of breath, leg swelling, or heart palpitations. He has no symptoms associated with physical exertion. He continues to drink too much alcohol at nighttime. He tells me that he drinks to calm his nerves. He wonders about use of a benzodiazepine.  Studies:  Lipid Panel     Component Value Date/Time   CHOL 125 03/29/2014 0826   TRIG 101.0 03/29/2014 0826   HDL 40.90 03/29/2014 0826   CHOLHDL 3 03/29/2014 0826   VLDL 20.2 03/29/2014 0826   LDLCALC 64 03/29/2014 0826   LDLDIRECT 78.1 01/15/2013 0747    Outpatient Encounter Prescriptions as of 04/05/2014  Medication Sig  . acetaminophen (TYLENOL) 500 MG tablet PRN for pain/fever  . aspirin 81 MG tablet Take 81 mg by mouth at bedtime.   Marland Kitchen atorvastatin (LIPITOR) 10 MG tablet TAKE 1 TABLET (10 MG TOTAL) BY MOUTH DAILY.  Marland Kitchen clopidogrel (PLAVIX) 75 MG tablet TAKE 1 TABLET DAILY, INS ONLY PAYS FOR 30 DAY  . famotidine (PEPCID) 20 MG tablet Take 20 mg by mouth daily as needed.   Marland Kitchen losartan (COZAAR) 50 MG tablet TAKE 1 TABLET DAILY, INS ONLY PAYS FOR 30 DAY  . metoprolol succinate (TOPROL-XL) 50 MG 24 hr tablet TAKE 1 TABLET (50 MG TOTAL) BY MOUTH AT BEDTIME. TAKE WITH OR IMMEDIATELY FOLLOWING A MEAL.  . Multiple Vitamin (MULTIVITAMIN WITH MINERALS) TABS NOT CONSISTANT  . tadalafil (CIALIS) 10 MG tablet Take 1 tablet (10 mg total) by mouth daily as needed for erectile dysfunction.  . [DISCONTINUED] metoprolol succinate (TOPROL-XL) 50 MG 24 hr tablet TAKE 1 TABLET (50 MG TOTAL) BY MOUTH AT BEDTIME. TAKE  WITH OR IMMEDIATELY FOLLOWING A MEAL.    No Known Allergies  Past Medical History  Diagnosis Date  . Dyslipidemia   . Left lumbar radiculopathy   . Hyperplasia of prostate without lower urinary tract symptoms (LUTS)   . Other and unspecified hyperlipidemia   . Hx of adenomatous colonic polyps 2008  . HTN (hypertension)     unspecified essential  . CAD (coronary artery disease)     DES to RCA and CFX; Dr. Burt Knack cardiologist  . AAA (abdominal aortic aneurysm) 08/2011    see Surgical history  . Anginal pain   . Arthritis     "knees; not bad"  . Depression ~ 2009    'treated for ~ 6 months"    family history includes Colon polyps in his father; Coronary artery disease in his father, maternal grandfather, and paternal grandfather; Diabetes in his father; Heart disease in his father; Hypertension in his father; Lung cancer in his father; Other in his mother. There is no history of Anesthesia problems or Stroke.    BP 122/88 mmHg  Pulse 59  Ht 6' (1.829 m)  Wt 294 lb 6.4 oz (133.539 kg)  BMI 39.92 kg/m2  PHYSICAL EXAM: Pt is alert and oriented, was an obese male inNAD HEENT: normal Neck: JVP - normal, carotids 2+= without bruits Lungs: CTA bilaterally CV: RRR without murmur or gallop Abd: soft, NT, Positive BS, no hepatomegaly Ext: no C/C/E, distal pulses intact and equal Skin: warm/dry no  rash  EKG:  Sinus rhythm 59 bpm, within normal limits.  ASSESSMENT AND PLAN: 1. Coronary artery disease, native vessel, without symptoms of angina. The patient will continue on his current medical program without changes.  2. Essential hypertension. Blood pressure well controlled on current medical therapy. Discussed low sodium diet, weight loss, and importance of exercise.  3. Hyperlipidemia. He is stable on atorvastatin. Lipids reviewed and they are excellent. LFTs are in range. I discussed concerns about his alcohol intake.  4. Abdominal aortic aneurysm status post surgical repair.  Followed by Dr. Oneida Alar.  I will see the patient back in 6 months with follow-up LFTs. He asked him to discuss the idea of a benzodiazepine with his primary care physician.  Sherren Mocha, MD 04/05/2014 9:50 AM

## 2014-04-05 NOTE — Patient Instructions (Signed)
Your physician recommends that you return for lab work in: 6 MONTHS (BMP and Liver-1 week prior to appointment with Dr Burt Knack)  Your physician wants you to follow-up in: 16 MONTHS with Dr Burt Knack.  You will receive a reminder letter in the mail two months in advance. If you don't receive a letter, please call our office to schedule the follow-up appointment.  Your physician recommends that you continue on your current medications as directed. Please refer to the Current Medication list given to you today.

## 2014-04-09 ENCOUNTER — Ambulatory Visit: Payer: Managed Care, Other (non HMO) | Admitting: Cardiovascular Disease

## 2014-04-28 ENCOUNTER — Other Ambulatory Visit: Payer: Self-pay | Admitting: Cardiovascular Disease

## 2014-05-20 ENCOUNTER — Other Ambulatory Visit: Payer: Self-pay | Admitting: Cardiovascular Disease

## 2014-06-02 ENCOUNTER — Other Ambulatory Visit: Payer: Self-pay | Admitting: Cardiovascular Disease

## 2014-06-10 ENCOUNTER — Other Ambulatory Visit: Payer: Self-pay | Admitting: Cardiovascular Disease

## 2014-06-14 ENCOUNTER — Telehealth: Payer: Self-pay | Admitting: Cardiovascular Disease

## 2014-06-14 NOTE — Telephone Encounter (Signed)
States he was returning call.  Someone had called him earlier to ask if he had flu shot.  Advised that no one from our office would have called him for that.  He said it was from this phone number.  Not having any problems or questions regarding meds or labs.

## 2014-06-14 NOTE — Telephone Encounter (Signed)
New Msg         Pt states he is returning call from today.

## 2014-06-14 NOTE — Telephone Encounter (Signed)
lmtcb

## 2014-06-15 IMAGING — CT CT CTA ABD/PEL W/CM AND/OR W/O CM
3 of 9 series · 11 of 36 positions shown, 17 images · IV contrast ([ID] [id number])
Comparison: CT abdomen pelvis - 12/24/2011;

CLINICAL DATA: Continuous left lower back and hip pain. Delmy Williams
([DATE]).

CT ANGIOGRAPHY ABDOMEN AND PELVIS
TECHNIQUE: Multidetector CT imaging of the abdomen and pelvis was
performed using the standard protocol during bolus administration
of intravenous contrast.  Multiplanar reconstructed images
including MIPs were obtained and reviewed to evaluate the vascular
anatomy.
Contrast: 100mL OMNIPAQUE IOHEXOL 350 MG/ML SOLN

[Series 4: angio · axial · 0.88mm/px · z∈[-398,-28]mm · 8 of 263 slices shown, 13 images]
[im 30/263  soft-tissue]
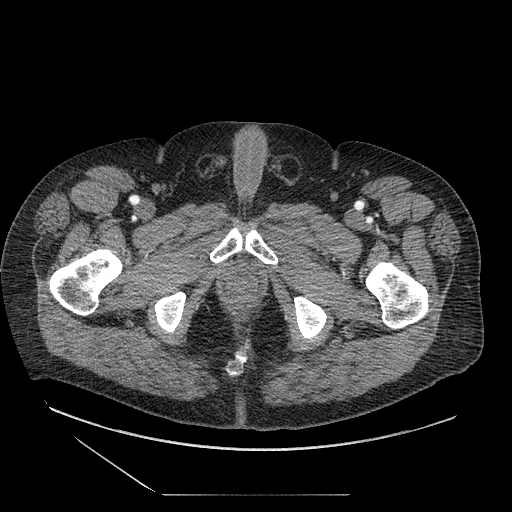
[im 30/263  bone]
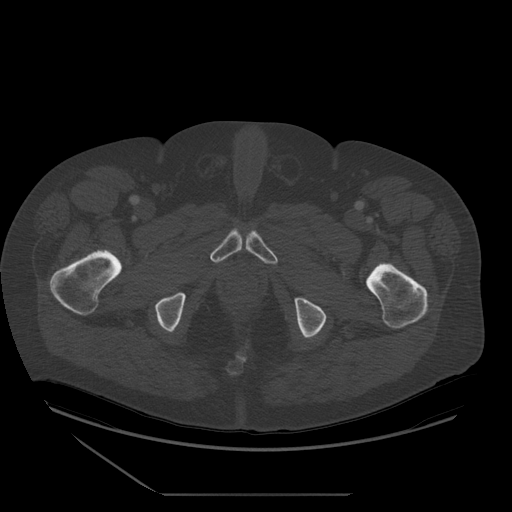
[im 59/263  soft-tissue]
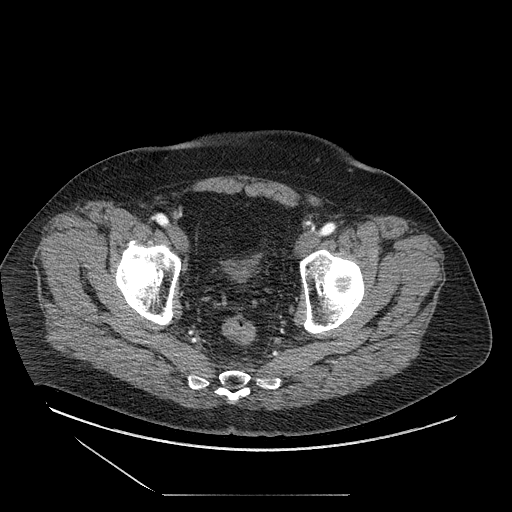
[im 88/263  soft-tissue]
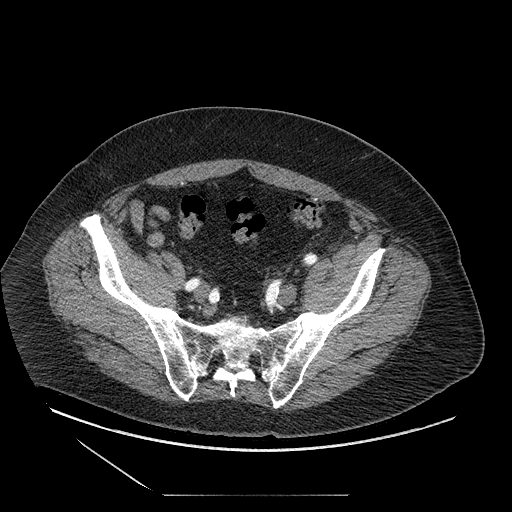
[im 117/263  soft-tissue]
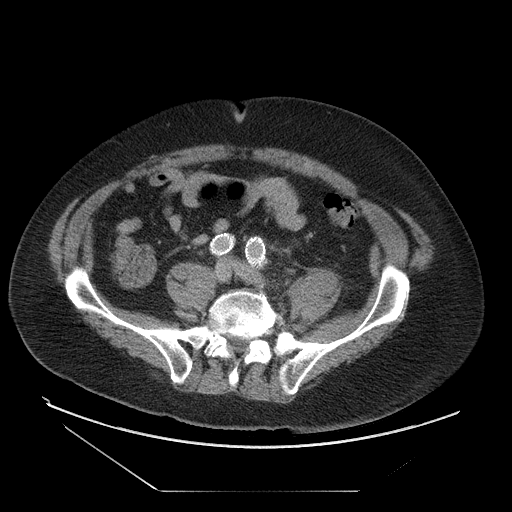
[im 146/263  soft-tissue]
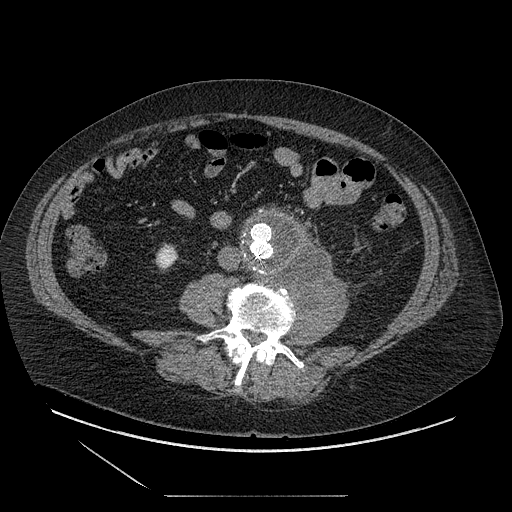
[im 146/263  lung]
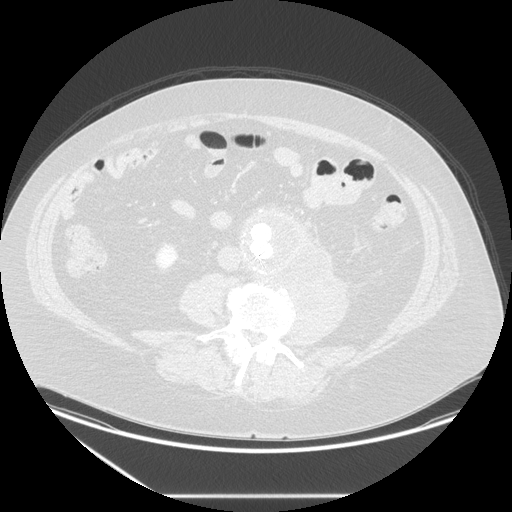
[im 175/263  soft-tissue]
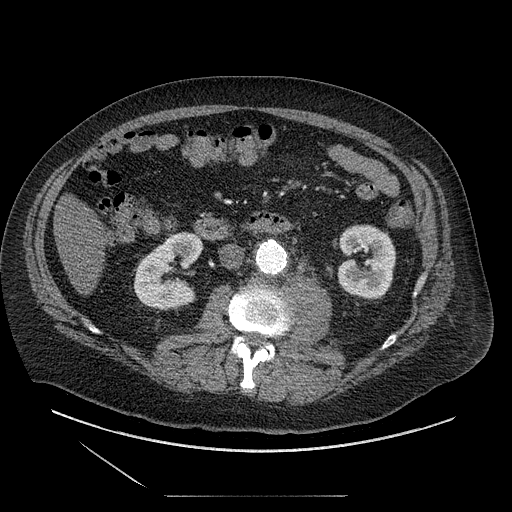
[im 175/263  lung]
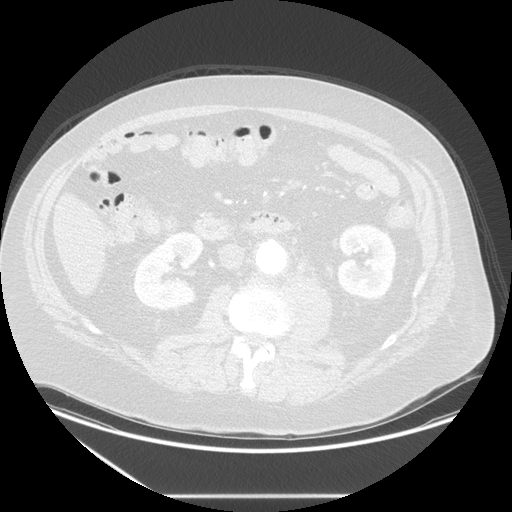
[im 204/263  soft-tissue]
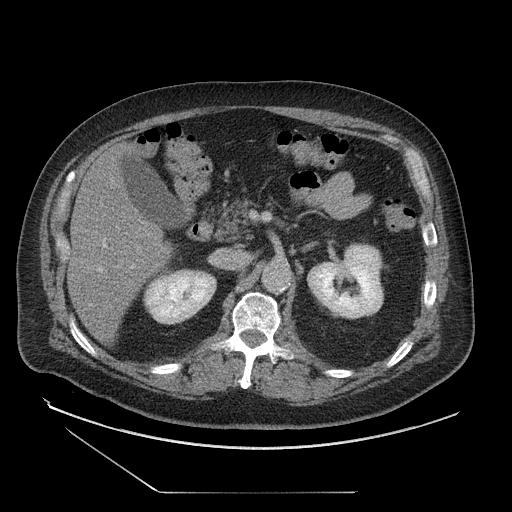
[im 204/263  lung]
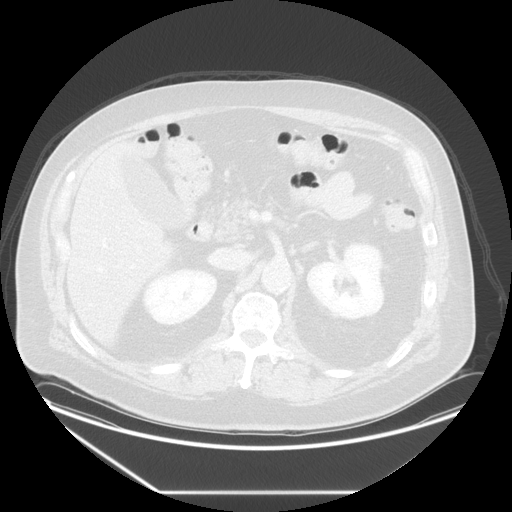
[im 233/263  soft-tissue]
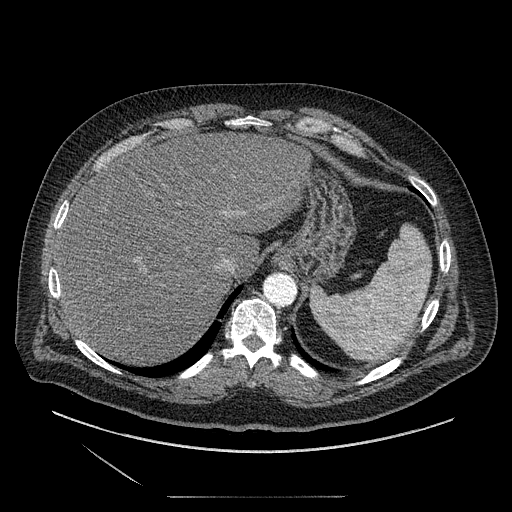
[im 233/263  lung]
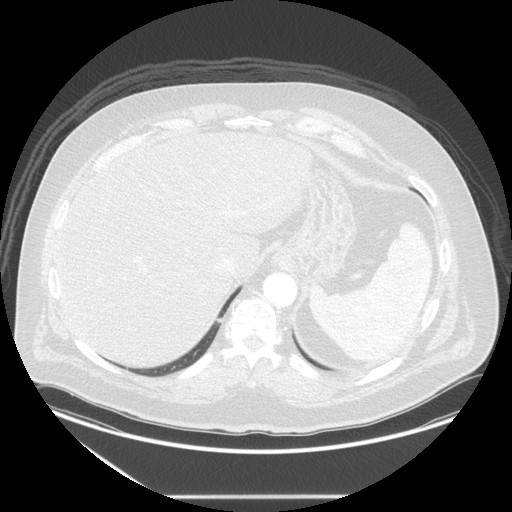

[Series 602: sagittal body · sagittal · 1.01mm/px · 2 of 182 slices shown]
[im 31/182  soft-tissue]
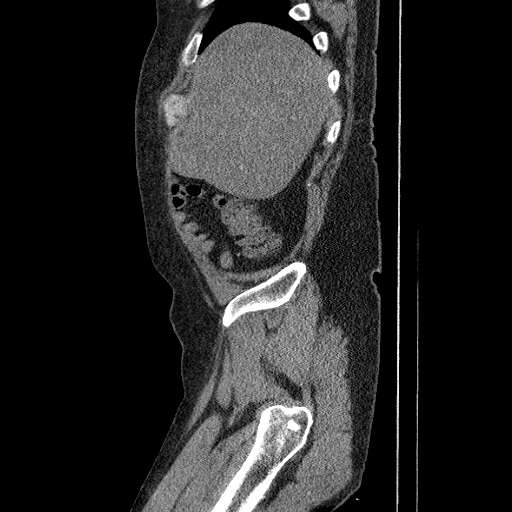
[im 61/182  soft-tissue]
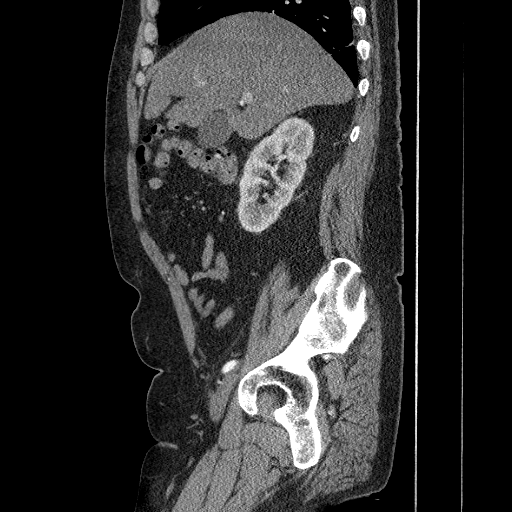

[Series 604: coronal body · coronal · 0.90mm/px · 1 of 130 slices shown, 2 images]
[im 2/130  soft-tissue]
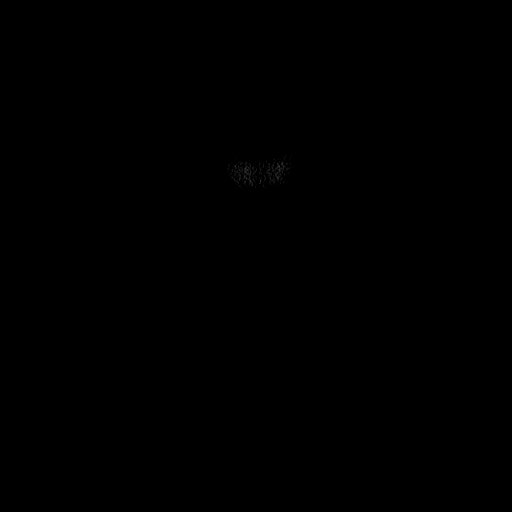
[im 2/130  bone]
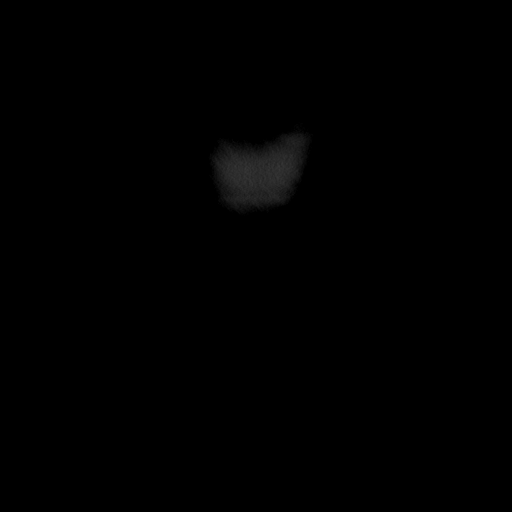

[11 of 36 positions shown; findings below may reference images not displayed]

10/07/2011

Arterial findings:
Aorta:                  Post endovascular repair of infrarenal
abdominal aortic aneurysm.  Review of the precontrast images are
negative for intramural hematoma.  The caliber of the now excluded
native infrarenal abdominal aorta is grossly unchanged measuring
5.3 x 5.2 cm in greatest transverse axial dimension (image 99,
series 4). No definite evidence of endoleak.

Interval increase in the size of the irregular multi septated
peripherally enhancing fluid collection about the left lateral
posterior aspect of the native abdominal aorta with the collection
now extending into the left iliopsoas musculature. The collection
now measures approximately 6.1 x 5.4 cm in greatest transverse
axial dimension (image 229, series four), and approximately 9 cm in
length (image 69, series 601). The cranial aspect of the abscess
appears to abut the right iliac gait (image 226, series four).
There is no definite evidence of osteolysis within the adjacent
lumbar spine to suggest osteomyelitis. Stranding extends into the
left hemipelvis.

Celiac axis:            Widely patent, possible branching pattern.

Superior mesenteric:Widely patent, possible branching pattern.

Left renal:             Solitary:  Widely patent

Right renal:            A solitary:  Widely patent

Inferior mesenteric:Appears patent at its origin, however again,
there is no definitive evidence of Endo leak on this examination.

Iliac vessels:          The distal ends of the grafts appear well
seated within their respective common iliac arteries.  The
bilateral common iliac arteries are widely patent.  The bilateral
external iliac arteries are tortuous but widely patent.

 Review of the MIP images confirms the above findings.

Nonvascular findings:

Normal hepatic contour.  No discrete hepatic lesions.  Normal
gallbladder.  No definite intra or extrahepatic biliary duct
dilatation.  No ascites.

There is symmetric enhancement of the bilateral kidneys.  No
definite renal stones.  There is grossly symmetric bilateral
perinephric stranding.  No urinary obstruction.  Normal appearance
of the bilateral adrenal glands, pancreas and spleen.

Scattered colonic diverticulosis without evidence of
diverticulitis.  The bowel is otherwise normal in course and
caliber without wall thickening or evidence of obstruction.  The
appendix is not visualized, with the prior history of prior
appendectomy.  No pneumoperitoneum, pneumatosis or portal venous
gas.

No definite retroperitoneal, mesenteric, pelvic or inguinal
lymphadenopathy.  Incidental note is made of grossly unchanged
small indirect mesenteric fat containing inguinal hernias.

Limited visualization of the lower thorax demonstrates minimal
dependent linear ground-glass opacities, left greater than right,
favored to represent subsegmental atelectasis.  No pleural
effusion.

Normal heart size.  No pericardial effusion.

No acute or aggressive osseous abnormalities.  Specifically, there
is no definitive CT evidence of diskitis/osteomyelitis.
IMPRESSION: 1.  Findings compatible with progression of periaortic infection
with possible involvement/seeding of the SHAANEHA prosthesis.
Interval increase in size of the now approximately 6.1 x 5.4 cm
peripherally enhancing abscess adjacent to the left posterior
lateral aspect of the excluded native abdominal aorta, now with
invasion of the iliopsoas musculature.  Note, the cranial aspect of
the abscess appears to abut the endograft.

2. Unchanged caliber of the excluded native abdominal aortic
aneurysm.  No evidence of endoleak.

Above findings discussed with Dr. Jorge Andres at 1888.

## 2014-08-08 ENCOUNTER — Other Ambulatory Visit: Payer: Self-pay | Admitting: Cardiovascular Disease

## 2014-11-29 ENCOUNTER — Other Ambulatory Visit: Payer: Self-pay | Admitting: Cardiovascular Disease

## 2014-12-23 ENCOUNTER — Other Ambulatory Visit: Payer: Self-pay | Admitting: Cardiovascular Disease

## 2015-01-08 ENCOUNTER — Encounter: Payer: Self-pay | Admitting: Family

## 2015-01-09 ENCOUNTER — Encounter: Payer: Self-pay | Admitting: Family

## 2015-01-09 ENCOUNTER — Ambulatory Visit (INDEPENDENT_AMBULATORY_CARE_PROVIDER_SITE_OTHER): Payer: Managed Care, Other (non HMO) | Admitting: Family

## 2015-01-09 VITALS — BP 136/91 | HR 63 | Temp 97.7°F | Ht 72.0 in | Wt 300.0 lb

## 2015-01-09 DIAGNOSIS — I714 Abdominal aortic aneurysm, without rupture, unspecified: Secondary | ICD-10-CM

## 2015-01-09 DIAGNOSIS — Z48812 Encounter for surgical aftercare following surgery on the circulatory system: Secondary | ICD-10-CM

## 2015-01-09 NOTE — Progress Notes (Signed)
[facility] [place]  Established Open AAA Repair  History of Present Illness  Alfred [name] is a 64 y.o. (01-12-1951) male patient of Dr. Oneida Alar who is s/p removal of Excluder AAA graft (stent placed April 2013), and placement of cadaveric aortic graft on 01/21/12. He has gained more weight since his last office visit. He is working full time. His exercise tolerance is at baseline. He denies any fever or chills. He has noticed a bulge at his umbilicus recently. This is more prominent when he stresses his abdomen. His AAA had been monitored over time by his cardiologist initially, then enlarged to a point at which he needed repair which occurred April 2013. He denies back pain, states a few twinges of abdominal pain that is lessening since the August 2013 surgery.  Pt returns today for a medical evaluation within a year of his repeat CTA abdomen/pelvis that is due in August 2017, 2 years after his last CTA.  His last visit with Dr. Oneida Alar was on 01/03/14. At that time CT angiogram the abdomen and pelvis showed a patent cadaveric aorta graft with no evidence of aneurysmal degeneration. No perianastomotic disruption. No evidence of infection.  Dr. Oneida Alar advised 2 year follow up with CTA abdomen/pelvis, and will refer to general surgery in the future if he wishes to have consideration for repair of his umbilical hernia. Pt states this umbilical hernia formed after lifting something heavy.  Pt states that he has 3-4 cardiac stents, denies any history of MI, denies any history of stroke or TIA.  He takes a statin, 81 mg ASA, and Plavix.  Pt Diabetic: No Pt smoker: former smoker, quit in 1995  Past Medical History  Diagnosis Date  . Dyslipidemia   . Left lumbar radiculopathy   . Hyperplasia of prostate without lower urinary tract symptoms (LUTS)   . Other and unspecified hyperlipidemia   . Hx of adenomatous colonic polyps 2008  . HTN (hypertension)     unspecified  essential  . CAD (coronary artery disease)     DES to RCA and CFX; Dr. Burt Knack cardiologist  . AAA (abdominal aortic aneurysm) 08/2011    see Surgical history  . Anginal pain   . Arthritis     "knees; not bad"  . Depression ~ 2009    'treated for ~ 6 months"  . Stroke     Past Surgical History  Procedure Laterality Date  . Colonoscopy w/ polypectomy  2008    adenomatous  & hyperplastic polyp;due 2013  . Cystoscopy  2005    varicose veins in bladder, Dr Diona Fanti  . Artery repair  09/06/2011    Procedure: ARTERY REPAIR;  Surgeon: Elam Dutch, MD;  Location: [place] [facility];  Service: Vascular;  Laterality: Bilateral;  . Appendectomy  1970  . Coronary angioplasty with stent placement  2002; 2005    "2 +1; total of 3"  . Tonsillectomy and adenoidectomy  1954  . Vasectomy    . Abdominal aortic aneurysm repair  09/06/2011  . Abdominal aortic aneurysm repair  01/21/2012    Procedure: ANEURYSM ABDOMINAL AORTIC REPAIR;  Surgeon: Elam Dutch, MD;  Location: [facility] OR;  Service: Vascular;  Laterality: N/A;  Aortio- bi-iliac repair  using Cryolife descending thoracic aorta 72mm x10.5 cm.   Social History Social History   Social History  . Marital Status: Married    Spouse Name: N/A  . Number of Children: N/A  . Years of Education: N/A   Occupational History  .  Farming    Social History Main Topics  . Smoking status: Former Smoker -- 1.00 packs/day for 27 years    Types: Cigarettes    Quit date: 05/31/1993  . Smokeless tobacco: Current User    Types: Snuff     Comment: smoked 1968-1995, up to 1 ppd. Currently using Skoal 1 can / day  . Alcohol Use: 12.6 oz/week    21 Shots of liquor per week     Comment: 2-3 drinks of Vodka a night, occasional beer  . Drug Use: No  . Sexual Activity: Yes   Other Topics Concern  . Not on file   Social History Narrative   Family History Family History  Problem Relation Age of Onset  . Coronary artery disease Father     CABG in 26s  . Diabetes  Father   . Colon polyps Father   . Lung cancer Father     non-small cell carcinoma/bone CA  . Hypertension Father   . Heart disease Father     before age 61  . Coronary artery disease Maternal Grandfather   . Coronary artery disease Paternal Grandfather   . Other Mother     varicose veins  . Anesthesia problems Neg Hx   . Stroke Neg Hx    Current Outpatient Prescriptions on File Prior to Visit  Medication Sig Dispense Refill  . acetaminophen (TYLENOL) 500 MG tablet PRN for pain/fever    . aspirin 81 MG tablet Take 81 mg by mouth at bedtime.     Marland Kitchen atorvastatin (LIPITOR) 10 MG tablet TAKE 1 TABLET BY MOUTH EVERY DAY 90 tablet 1  . atorvastatin (LIPITOR) 10 MG tablet TAKE 1 TABLET BY MOUTH EVERY DAY 90 tablet 0  . clopidogrel (PLAVIX) 75 MG tablet TAKE 1 TABLET BY MOUTH EVERY DAY 90 tablet 1  . clopidogrel (PLAVIX) 75 MG tablet TAKE 1 TABLET BY MOUTH EVERY DAY 30 tablet 0  . famotidine (PEPCID) 20 MG tablet Take 20 mg by mouth daily as needed.     Marland Kitchen losartan (COZAAR) 50 MG tablet TAKE 1 TABLET BY MOUTH DAILY 90 tablet 1  . losartan (COZAAR) 50 MG tablet TAKE 1 TABLET BY MOUTH DAILY 90 tablet 0  . metoprolol succinate (TOPROL-XL) 50 MG 24 hr tablet TAKE 1 TABLET (50 MG TOTAL) BY MOUTH AT BEDTIME. TAKE WITH OR IMMEDIATELY FOLLOWING A MEAL. 30 tablet 5  . metoprolol succinate (TOPROL-XL) 50 MG 24 hr tablet TAKE 1 TABLET (50 MG TOTAL) BY MOUTH AT BEDTIME. TAKE WITH OR IMMEDIATELY FOLLOWING A MEAL. 30 tablet 2  . Multiple Vitamin (MULTIVITAMIN WITH MINERALS) TABS NOT CONSISTANT    . tadalafil (CIALIS) 10 MG tablet Take 1 tablet (10 mg total) by mouth daily as needed for erectile dysfunction. 10 tablet 2   No current facility-administered medications on file prior to visit.   No Known Allergies  ROS: See HPI for pertinent positives and negatives.    Physical Examination  Filed Vitals:   01/09/15 0914 01/09/15 0915  BP: 142/93 136/91  Pulse: 59 63  Temp: 97.7 F (36.5 C) 97.7 F  (36.5 C)  TempSrc: Oral Oral  Height: 6' (1.829 m) 6' (1.829 m)  Weight: 300 lb (136.079 kg) 300 lb (136.079 kg)  SpO2: 97% 97%   Body mass index is 40.68 kg/(m^2).   General: A&O x 3, WD, morbidly obese male.  Pulmonary: Sym exp, good air movt, CTAB, no rales, rhonchi, or wheezing.   Cardiac: RRR, Nl S1, S2, no detected  murmur.   Carotid Bruits Right Left   Negative Negative    Aorta is not palpable. Radial pulses:right 2+, left 1+ palpable.                         VASCULAR EXAM: Extremities without ischemic changes, without Gangrene; without open wounds.                                                                                                          LE Pulses Right Left       FEMORAL   1+ palpable (morbidly obese)  1+ palpable (morbidly obese)        POPLITEAL  1+ palpable   not palpable       POSTERIOR TIBIAL  2+ palpable   2+ palpable        DORSALIS PEDIS      ANTERIOR TIBIAL 2+ palpable  not palpable      Gastrointestinal: soft, NTND, -G/R, - HSM, - palpable masses, - CVAT B.  Musculoskeletal: M/S 5/5 throughout, Extremities without ischemic changes.  Neurologic: Pain and light touch intact in extremities, Motor exam as listed above.    01/03/14 CTA abd/pelvis:  Aorta: Normal appearance of the distal descending thoracic aorta and suprarenal abdominal aorta. Patient has an infrarenal abdominal aortic graft which is patent. The configuration and size of this graft is unchanged. There is mild ectasia of the distal graft, measuring up to 2.2 cm and unchanged. The abdominal aorta measures up to 2.8 cm at the level of the right renal artery which is similar to the previous examination.  Celiac axis: Celiac trunk and main branches are patent.  Superior mesenteric: Patent  Left renal: Patent  Right renal: Patent  Inferior mesenteric: Not visualized  Left iliac: Proximal left iliac artery measures up to 2.1 cm  and unchanged. No evidence for a dissection. The left external and internal iliac arteries are patent. Proximal left femoral arteries are patent.  Right iliac: Stable size of the right common iliac artery measuring up to 1.9 cm. There is flow in the right internal and external iliac arteries. Flow in the proximal right femoral arteries. IMPRESSION: Stable postsurgical changes in the abdominal aorta without complicating features. No evidence for recurrent aneurysm, leakage or infection.   Medical Decision Making  Alfred [name] is a 64 y.o. male who is s/p removal of Excluder AAA graft (stent placed April 2013), and placement of cadaveric aortic graft on 01/21/12.  I discussed with the patient the importance of surveillance.  The next CTA will be scheduled for August 2017, follow up with Dr. Oneida Alar at that time.   Thank you for allowing [place] to participate in this patient's care.  Clemon Chambers, RN, MSN, FNP-C [facility] [place] Office: [phone]   Clinic Physician: Oneida Alar   01/09/2015, 9:22 AM

## 2015-02-04 ENCOUNTER — Other Ambulatory Visit: Payer: Self-pay | Admitting: Cardiovascular Disease

## 2015-02-17 ENCOUNTER — Other Ambulatory Visit: Payer: Self-pay | Admitting: Cardiovascular Disease

## 2015-03-04 ENCOUNTER — Other Ambulatory Visit: Payer: Self-pay | Admitting: Cardiovascular Disease

## 2015-03-09 ENCOUNTER — Other Ambulatory Visit: Payer: Self-pay | Admitting: Cardiovascular Disease

## 2015-03-20 ENCOUNTER — Other Ambulatory Visit: Payer: Self-pay | Admitting: Cardiovascular Disease

## 2015-04-06 ENCOUNTER — Other Ambulatory Visit: Payer: Self-pay | Admitting: Cardiovascular Disease

## 2015-04-10 ENCOUNTER — Other Ambulatory Visit: Payer: Self-pay | Admitting: Cardiovascular Disease

## 2015-04-12 ENCOUNTER — Other Ambulatory Visit: Payer: Self-pay | Admitting: Cardiovascular Disease

## 2015-04-14 ENCOUNTER — Telehealth (HOSPITAL_COMMUNITY): Payer: Self-pay | Admitting: *Deleted

## 2015-04-14 ENCOUNTER — Ambulatory Visit (INDEPENDENT_AMBULATORY_CARE_PROVIDER_SITE_OTHER): Payer: Managed Care, Other (non HMO) | Admitting: Cardiology

## 2015-04-14 ENCOUNTER — Encounter: Payer: Self-pay | Admitting: Cardiology

## 2015-04-14 VITALS — BP 140/92 | HR 69 | Ht 72.0 in | Wt 299.8 lb

## 2015-04-14 DIAGNOSIS — I10 Essential (primary) hypertension: Secondary | ICD-10-CM | POA: Diagnosis not present

## 2015-04-14 DIAGNOSIS — I2510 Atherosclerotic heart disease of native coronary artery without angina pectoris: Secondary | ICD-10-CM | POA: Diagnosis not present

## 2015-04-14 LAB — HEPATIC FUNCTION PANEL
ALBUMIN: 4.1 g/dL (ref 3.6–5.1)
ALT: 36 U/L (ref 9–46)
AST: 28 U/L (ref 10–35)
Alkaline Phosphatase: 80 U/L (ref 40–115)
Bilirubin, Direct: 0.2 mg/dL (ref <=0.2)
Indirect Bilirubin: 0.6 mg/dL (ref 0.2–1.2)
TOTAL PROTEIN: 6.8 g/dL (ref 6.1–8.1)
Total Bilirubin: 0.8 mg/dL (ref 0.2–1.2)

## 2015-04-14 LAB — LIPID PANEL
CHOL/HDL RATIO: 3 ratio (ref <=5.0)
Cholesterol: 130 mg/dL (ref 125–200)
HDL: 43 mg/dL (ref >=40)
LDL Cholesterol: 71 mg/dL (ref <130)
TRIGLYCERIDES: 82 mg/dL (ref <150)
VLDL: 16 mg/dL (ref <30)

## 2015-04-14 MED ORDER — LOSARTAN POTASSIUM 50 MG PO TABS: 50.0000 mg | ORAL_TABLET | Freq: Every day | ORAL | Status: DC

## 2015-04-14 MED ORDER — METOPROLOL SUCCINATE ER 50 MG PO TB24: 50.0000 mg | ORAL_TABLET | Freq: Every day | ORAL | Status: DC

## 2015-04-14 MED ORDER — ATORVASTATIN CALCIUM 10 MG PO TABS: 10.0000 mg | ORAL_TABLET | Freq: Every day | ORAL | Status: DC

## 2015-04-14 NOTE — Telephone Encounter (Signed)
Patient given detailed instructions per Myocardial Perfusion Study Information Sheet for the test on 04/15/15 at 1245. Patient notified to arrive 15 minutes early and that it is imperative to arrive on time for appointment to keep from having the test rescheduled.  If you need to cancel or reschedule your appointment, please call the office within 24 hours of your appointment. Failure to do so may result in a cancellation of your appointment, and a $50 no show fee. Patient verbalized understanding.Hina Gupta, Ranae Palms

## 2015-04-14 NOTE — Patient Instructions (Addendum)
Medication Instructions:  Your physician recommends that you continue on your current medications as directed. Please refer to the Current Medication list given to you today.  We have refilled the following medications today at your office visit: Metoprolol Cozaar Lipitor  Labwork: TODAY:  LIPID                HEPATIC  Testing/Procedures: Your physician has requested that you have a lexiscan myoview. For further information please visit [email or web address]. Please follow instruction sheet, as given.    Follow-Up: Your physician wants you to follow-up in:  [place].  You will receive a reminder letter in the mail two months in advance. If you don't receive a letter, please call our office to schedule the follow-up appointment.   Any Other Special Instructions Will Be Listed Below (If Applicable).  Pharmacologic Stress Electrocardiogram A pharmacologic stress electrocardiogram is a heart (cardiac) test that uses nuclear imaging to evaluate the blood supply to your heart. This test may also be called a pharmacologic stress electrocardiography. Pharmacologic means that a medicine is used to increase your heart rate and blood pressure.  This stress test is done to find areas of poor blood flow to the heart by determining the extent of coronary artery disease (CAD). Some people exercise on a treadmill, which naturally increases the blood flow to the heart. For those people unable to exercise on a treadmill, a medicine is used. This medicine stimulates your heart and will cause your heart to beat harder and more quickly, as if you were exercising.  Pharmacologic stress tests can help determine:  The adequacy of blood flow to your heart during increased levels of activity in order to clear you for discharge home.  The extent of coronary artery blockage caused by CAD.  Your prognosis if you have suffered a heart attack.  The effectiveness of cardiac procedures done, such as  an angioplasty, which can increase the circulation in your coronary arteries.  Causes of chest pain or pressure. LET [facility] CARE PROVIDER KNOW ABOUT:  Any allergies you have.  All medicines you are taking, including vitamins, herbs, eye drops, creams, and over-the-counter medicines.  Previous problems you or members of your family have had with the use of anesthetics.  Any blood disorders you have.  Previous surgeries you have had.  Medical conditions you have.  Possibility of pregnancy, if this applies.  If you are currently breastfeeding. RISKS AND COMPLICATIONS Generally, this is a safe procedure. However, as with any procedure, complications can occur. Possible complications include:  You develop pain or pressure in the following areas:  Chest.  Jaw or neck.  Between your shoulder blades.  Radiating down your left arm.  Headache.  Dizziness or light-headedness.  Shortness of breath.  Increased or irregular heartbeat.  Low blood pressure.  Nausea or vomiting.  Flushing.  Redness going up the arm and slight pain during injection of medicine.  Heart attack (rare). BEFORE THE PROCEDURE   Avoid all forms of caffeine for 24 hours before your test or as directed by your health care provider. This includes coffee, tea (even decaffeinated tea), caffeinated sodas, chocolate, cocoa, and certain pain medicines.  Follow your health care provider's instructions regarding eating and drinking before the test.  Take your medicines as directed at regular times with water unless instructed otherwise. Exceptions may include:  If you have diabetes, ask how you are to take your insulin or pills. It is common to adjust insulin dosing the  morning of the test.  If you are taking beta-blocker medicines, it is important to talk to your health care provider about these medicines well before the date of your test. Taking beta-blocker medicines may interfere with the test. In  some cases, these medicines need to be changed or stopped 24 hours or more before the test.  If you wear a nitroglycerin patch, it may need to be removed prior to the test. Ask your health care provider if the patch should be removed before the test.  If you use an inhaler for any breathing condition, bring it with you to the test.  If you are an outpatient, bring a snack so you can eat right after the stress phase of the test.  Do not smoke for 4 hours prior to the test or as directed by your health care provider.  Do not apply lotions, powders, creams, or oils on your chest prior to the test.  Wear comfortable shoes and clothing. Let your health care provider know if you were unable to complete or follow the preparations for your test. PROCEDURE   Multiple patches (electrodes) will be put on your chest. If needed, small areas of your chest may be shaved to get better contact with the electrodes. Once the electrodes are attached to your body, multiple wires will be attached to the electrodes, and your heart rate will be monitored.  An IV access will be started. A nuclear trace (isotope) is given. The isotope may be given intravenously, or it may be swallowed. Nuclear refers to several types of radioactive isotopes, and the nuclear isotope lights up the arteries so that the nuclear images are clear. The isotope is absorbed by your body. This results in low radiation exposure.  A resting nuclear image is taken to show how your heart functions at rest.  A medicine is given through the IV access.  A second scan is done about 1 hour after the medicine injection and determines how your heart functions under stress.  During this stress phase, you will be connected to an electrocardiogram machine. Your blood pressure and oxygen levels will be monitored. AFTER THE PROCEDURE   Your heart rate and blood pressure will be monitored after the test.  You may return to your normal schedule, including  diet,activities, and medicines, unless your health care provider tells you otherwise.   This information is not intended to replace advice given to you by your health care provider. Make sure you discuss any questions you have with your health care provider.   Document Released: 10/03/2008 Document Revised: 05/22/2013 Document Reviewed: 01/22/2013 Elsevier Interactive Patient Education [employer].

## 2015-04-14 NOTE — Progress Notes (Signed)
04/14/2015 Alfred [name]   Nov 24, 1950  [id number]  Primary Physician Sherren Mocha, MD Primary Cardiologist: Dr. Burt Knack  Reason for Visit/CC: Routine 1 Year f/u for CAD  HPI:  The patient is a 64 y/o male, followed by Dr. Burt Knack, who presents to clinic today for routine f/u. His cardiac history is significant for CAD s/p DES to the distal Circumflex and DES to the proximal RCA and PDA in May 2005 by Dr. Lia Foyer.  His most recent LHC in May 2006 showed nonobstructive CAD with patent stents and normal LVF. He also has a h/o abdominal aortic aneurysm and underwent endovascular repair in 2013. Because of an infected graft, he had to undergo open abdominal aortic aneurysm repair with a cadaveric graft.  This is followed by Dr. Oneida Alar. Additional medical history includes HTN, HLD and alcohol use. Last OV with Dr. Burt Knack was Nov. 2015 and he was w/o complaints and stable from a cardiovascular standpoint.   Today, he reports that he has done well. He denies chest pain and dyspnea. No exertional symptoms or limitations with physical activity. However, he reports that he was asymptomatic in the past, prior to undergoing coronary stenting. His EKG shows no ischemia. He reports full medication compliance, however states that he ran out of his metoprolol 5 days ago and is in need of refills. His BP is moderately elevated in the 0000000 systolic. 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    Current Outpatient Prescriptions  Medication Sig Dispense Refill  . acetaminophen (TYLENOL) 500 MG tablet Take 500 mg by mouth every 6 (six) hours as needed for mild pain. PRN for pain/fever    . aspirin 81 MG tablet Take 81 mg by mouth at bedtime.     Marland Kitchen atorvastatin (LIPITOR) 10 MG tablet Take 1 tablet (10 mg total) by mouth daily. 90 tablet 1  . clopidogrel (PLAVIX) 75 MG tablet TAKE 1 TABLET BY MOUTH EVERY DAY 30 tablet 0  . famotidine  (PEPCID) 20 MG tablet Take 20 mg by mouth daily as needed.     Marland Kitchen losartan (COZAAR) 50 MG tablet Take 1 tablet (50 mg total) by mouth daily. 90 tablet 1  . metoprolol succinate (TOPROL-XL) 50 MG 24 hr tablet Take 1 tablet (50 mg total) by mouth daily. Take with or immediately following a meal. 90 tablet 1   No current facility-administered medications for this visit.    No Known Allergies  Social History   Social History  . Marital Status: Married    Spouse Name: N/A  . Number of Children: N/A  . Years of Education: N/A   Occupational History  . Farming    Social History Main Topics  . Smoking status: Former Smoker -- 1.00 packs/day for 27 years    Types: Cigarettes    Quit date: 05/31/1993  . Smokeless tobacco: Current User    Types: Snuff     Comment: smoked 1968-1995, up to 1 ppd. Currently using Skoal 1 can / day  . Alcohol Use: 12.6 oz/week    21 Shots of liquor per week     Comment: 2-3 drinks of Vodka a night, occasional beer  . Drug Use: No  . Sexual Activity: Yes   Other Topics Concern  . Not on file   Social History Narrative     Review of Systems: General: negative for chills, fever, night sweats or  weight changes.  Cardiovascular: negative for chest pain, dyspnea on exertion, edema, orthopnea, palpitations, paroxysmal nocturnal dyspnea or shortness of breath Dermatological: negative for rash Respiratory: negative for cough or wheezing Urologic: negative for hematuria Abdominal: negative for nausea, vomiting, diarrhea, bright red blood per rectum, melena, or hematemesis Neurologic: negative for visual changes, syncope, or dizziness All other systems reviewed and are otherwise negative except as noted above.    Blood pressure 140/92, pulse 69, height 6' (1.829 m), weight 299 lb 12.8 oz (135.988 kg).  General appearance: alert, cooperative and no distress Neck: no carotid bruit and no JVD Lungs: clear to auscultation bilaterally Heart: regular rate and  rhythm, S1, S2 normal, no murmur, click, rub or gallop Extremities: no LEE Pulses: 2+ and symmetric Skin: warm and dry] Neurologic: Grossly normal  EKG NSR. No ischemia.   ASSESSMENT AND PLAN:   1. Coronary artery disease, native vessel: s/p PCI to distal circumflex, RCA and PDA in 2005 with patent stents on last cath in 2006. He is w/o symptoms of angina, however he reports that he was asymptomatic in the past when he was discovered to have circumflex and RCA disease. He is in need for a stress test for DOT physical. His last ischemic eval was 10 years ago in 2006. We will order a nuclear chemical stress test to r/o ischemia. Due to chronic hip pain, he is unable to participate in an exercise test.The patient will continue on his current medical program without changes.  2. Essential hypertension: Blood pressure is mildly elevated today in the 140s but he has been out of metoprolol for the last 5 days. We will refill Rx today. We discussed low sodium diet, weight loss, and importance of exercise.  3. Hyperlipidemia. on atorvastatin. Last lipid panel was 02/2014. Will reorder today. LDL goal in the setting of CAD is <70. Will also check HFTs.  Discussed concerns about his alcohol intake.  4. Abdominal aortic aneurysm status post surgical repair. Followed by Dr. Oneida Alar.   PLAN  Stress test for DOT physical. FLP + HFTs for lipid/ statin monitoring. Continue current meds for CAD, HTN and HLD. F/u with Dr. Burt Knack in 6 months.   Estephani Popper PA-C 04/14/2015 9:20 AM

## 2015-04-15 ENCOUNTER — Ambulatory Visit (HOSPITAL_COMMUNITY): Payer: Managed Care, Other (non HMO) | Attending: Cardiology

## 2015-04-15 DIAGNOSIS — I10 Essential (primary) hypertension: Secondary | ICD-10-CM | POA: Insufficient documentation

## 2015-04-15 DIAGNOSIS — I2510 Atherosclerotic heart disease of native coronary artery without angina pectoris: Secondary | ICD-10-CM | POA: Diagnosis not present

## 2015-04-15 MED ORDER — TECHNETIUM TC 99M SESTAMIBI GENERIC - CARDIOLITE
32.5000 | Freq: Once | INTRAVENOUS | Status: AC | PRN
  Administered 2015-04-15: 33 via INTRAVENOUS

## 2015-04-15 MED ORDER — REGADENOSON 0.4 MG/5ML IV SOLN
0.4000 mg | Freq: Once | INTRAVENOUS | Status: AC
  Administered 2015-04-15: 0.4 mg via INTRAVENOUS

## 2015-04-16 ENCOUNTER — Encounter: Payer: Self-pay | Admitting: Cardiology

## 2015-04-16 ENCOUNTER — Ambulatory Visit (HOSPITAL_COMMUNITY): Payer: Managed Care, Other (non HMO) | Attending: Internal Medicine

## 2015-04-16 ENCOUNTER — Encounter: Payer: Self-pay | Admitting: *Deleted

## 2015-04-16 DIAGNOSIS — R0989 Other specified symptoms and signs involving the circulatory and respiratory systems: Secondary | ICD-10-CM

## 2015-04-16 LAB — MYOCARDIAL PERFUSION IMAGING
CHL CUP NUCLEAR SRS: 3
CHL CUP RESTING HR STRESS: 75 {beats}/min
CSEPPHR: 94 {beats}/min
LVDIAVOL: 126 mL
LVSYSVOL: 56 mL
NUC STRESS TID: 1.16
RATE: 0.33
SDS: 0
SSS: 3

## 2015-04-16 MED ORDER — TECHNETIUM TC 99M SESTAMIBI GENERIC - CARDIOLITE
30.5000 | Freq: Once | INTRAVENOUS | Status: AC | PRN
  Administered 2015-04-16: 31 via INTRAVENOUS

## 2015-06-21 ENCOUNTER — Other Ambulatory Visit: Payer: Self-pay | Admitting: Cardiovascular Disease

## 2015-08-07 ENCOUNTER — Other Ambulatory Visit: Payer: Self-pay | Admitting: Cardiology

## 2015-09-05 ENCOUNTER — Other Ambulatory Visit: Payer: Self-pay | Admitting: Cardiology

## 2015-11-27 ENCOUNTER — Other Ambulatory Visit: Payer: Self-pay | Admitting: Cardiology

## 2015-12-25 ENCOUNTER — Other Ambulatory Visit: Payer: Self-pay | Admitting: Cardiovascular Disease

## 2016-01-07 ENCOUNTER — Other Ambulatory Visit: Payer: Self-pay | Admitting: Vascular Surgery

## 2016-01-08 LAB — BUN: BUN: 17 mg/dL (ref 7–25)

## 2016-01-08 LAB — CREATININE, SERUM: CREATININE: 1.07 mg/dL (ref 0.70–1.25)

## 2016-01-15 ENCOUNTER — Ambulatory Visit
Admission: RE | Admit: 2016-01-15 | Discharge: 2016-01-15 | Disposition: A | Discharge status: Home / Self Care | Payer: Managed Care, Other (non HMO) | Source: Ambulatory Visit | Attending: Family | Admitting: Family

## 2016-01-15 DIAGNOSIS — I714 Abdominal aortic aneurysm, without rupture, unspecified: Secondary | ICD-10-CM

## 2016-01-15 DIAGNOSIS — Z48812 Encounter for surgical aftercare following surgery on the circulatory system: Secondary | ICD-10-CM

## 2016-01-15 MED ORDER — IOPAMIDOL (ISOVUE-300) INJECTION 61%
100.0000 mL | Freq: Once | INTRAVENOUS | Status: AC | PRN
  Administered 2016-01-15: 100 mL via INTRAVENOUS

## 2016-01-19 ENCOUNTER — Encounter: Payer: Self-pay | Admitting: Vascular Surgery

## 2016-01-22 ENCOUNTER — Encounter: Payer: Self-pay | Admitting: Vascular Surgery

## 2016-01-22 ENCOUNTER — Ambulatory Visit (INDEPENDENT_AMBULATORY_CARE_PROVIDER_SITE_OTHER): Payer: Managed Care, Other (non HMO) | Admitting: Vascular Surgery

## 2016-01-22 VITALS — BP 138/87 | HR 65 | Temp 97.2°F | Resp 18 | Ht 72.0 in | Wt 306.4 lb

## 2016-01-22 DIAGNOSIS — I714 Abdominal aortic aneurysm, without rupture, unspecified: Secondary | ICD-10-CM

## 2016-01-22 NOTE — Progress Notes (Signed)
Progress Note   4 years post-op removal of for Excluder AAA graft, and placement of cadaveric aortic graft on 01/21/12. He is working full time. His exercise tolerance is at baseline. He denies any fever or chills. He has noticed a bulge at his umbilicus but no change over the past year. This is more prominent when he stresses his abdomen.     Current Outpatient Prescriptions on File Prior to Visit  Medication Sig Dispense Refill  . acetaminophen (TYLENOL) 500 MG tablet Take 500 mg by mouth every 6 (six) hours as needed for mild pain. PRN for pain/fever    . aspirin 81 MG tablet Take 81 mg by mouth at bedtime.     Marland Kitchen atorvastatin (LIPITOR) 10 MG tablet TAKE 1 TABLET (10 MG TOTAL) BY MOUTH DAILY. 90 tablet 1  . clopidogrel (PLAVIX) 75 MG tablet TAKE 1 TABLET BY MOUTH EVERY DAY 30 tablet 3  . famotidine (PEPCID) 20 MG tablet Take 20 mg by mouth daily as needed.     Marland Kitchen losartan (COZAAR) 50 MG tablet TAKE 1 TABLET (50 MG TOTAL) BY MOUTH DAILY. 90 tablet 1  . metoprolol succinate (TOPROL-XL) 50 MG 24 hr tablet TAKE 1 TABLET (50 MG TOTAL) BY MOUTH DAILY. TAKE WITH OR IMMEDIATELY FOLLOWING A MEAL. 90 tablet 3   No current facility-administered medications on file prior to visit.      Review of systems: He denies shortness of breath. He denies chest pain.      Vitals:   01/22/16 1218  BP: 138/87  Pulse: 65  Resp: 18  Temp: 97.2 F (36.2 C)  TempSrc: Oral  SpO2: 97%  Weight: (!) 306 lb 6.4 oz (139 kg)  Height: 6' (1.829 m)     Palp femoral pulses bilaterally Incision is well healed without tenderness to palpation. There is a 2 cm hernia defect periumbilical easily reducible   Data: CT angiogram the abdomen and pelvis was performed today. This shows a patent aorta cadaveric graft with no evidence of aneurysmal degeneration. No perianastomotic disruption. No evidence of infection.   Assessment/Plan:  64 y.o. male is s/p removal of Gore Excluder AAA stent graft, and placement of cadaveric  graft on 01/21/12.     He will try to lose some weight at this point.  CTA Abdomen and pelvis in 5 years.  We'll refer to general surgery in the future if he wishes to have consideration for repair of his umbilical hernia.     Ruta Hinds, MD [facility] [place] Office: [phone] Pager: [phone]

## 2016-01-23 ENCOUNTER — Other Ambulatory Visit: Payer: Self-pay | Admitting: *Deleted

## 2016-01-23 DIAGNOSIS — I714 Abdominal aortic aneurysm, without rupture, unspecified: Secondary | ICD-10-CM

## 2016-03-23 DIAGNOSIS — D2239 Melanocytic nevi of other parts of face: Secondary | ICD-10-CM | POA: Diagnosis not present

## 2016-03-23 DIAGNOSIS — L570 Actinic keratosis: Secondary | ICD-10-CM | POA: Diagnosis not present

## 2016-03-23 DIAGNOSIS — C44319 Basal cell carcinoma of skin of other parts of face: Secondary | ICD-10-CM | POA: Diagnosis not present

## 2016-03-23 DIAGNOSIS — L820 Inflamed seborrheic keratosis: Secondary | ICD-10-CM | POA: Diagnosis not present

## 2016-04-26 ENCOUNTER — Other Ambulatory Visit: Payer: Self-pay | Admitting: Cardiovascular Disease

## 2016-04-28 DIAGNOSIS — C44329 Squamous cell carcinoma of skin of other parts of face: Secondary | ICD-10-CM | POA: Diagnosis not present

## 2016-05-25 ENCOUNTER — Other Ambulatory Visit: Payer: Self-pay | Admitting: Cardiovascular Disease

## 2016-06-25 ENCOUNTER — Other Ambulatory Visit: Payer: Self-pay | Admitting: Cardiovascular Disease

## 2016-07-26 ENCOUNTER — Other Ambulatory Visit: Payer: Self-pay | Admitting: Cardiology

## 2016-07-28 ENCOUNTER — Other Ambulatory Visit: Payer: Self-pay | Admitting: Cardiovascular Disease

## 2016-08-09 ENCOUNTER — Encounter: Payer: Self-pay | Admitting: Cardiovascular Disease

## 2016-08-09 ENCOUNTER — Ambulatory Visit (INDEPENDENT_AMBULATORY_CARE_PROVIDER_SITE_OTHER): Payer: PPO | Admitting: Cardiovascular Disease

## 2016-08-09 VITALS — BP 152/100 | HR 67 | Ht 72.0 in | Wt 316.8 lb

## 2016-08-09 DIAGNOSIS — I2510 Atherosclerotic heart disease of native coronary artery without angina pectoris: Secondary | ICD-10-CM | POA: Diagnosis not present

## 2016-08-09 DIAGNOSIS — I10 Essential (primary) hypertension: Secondary | ICD-10-CM | POA: Diagnosis not present

## 2016-08-09 MED ORDER — HYDROCHLOROTHIAZIDE 25 MG PO TABS: 25.0000 mg | ORAL_TABLET | Freq: Every day | ORAL | 3 refills | Status: DC

## 2016-08-09 NOTE — Patient Instructions (Addendum)
Medication Instructions:  Your physician has recommended you make the following change in your medication:  1. INCREASE Losartan to 100mg  take one tablet by mouth daily 2. START HCTZ (hydrochlorathiazide) 25mg  take one tablet by mouth daily  Labwork: Your physician recommends that you return for a FASTING LIPID, CBC and CMP in 2 WEEKS--nothing to eat or drink after midnight, lab opens at 7:30 AM (March 26th)  Testing/Procedures: No new orders.   Follow-Up: Your physician wants you to follow-up in: 6 MONTHS with Dr Burt Knack.  You will receive a reminder letter in the mail two months in advance. If you don't receive a letter, please call our office to schedule the follow-up appointment.   Any Other Special Instructions Will Be Listed Below (If Applicable).     If you need a refill on your cardiac medications before your next appointment, please call your pharmacy.

## 2016-08-09 NOTE — Progress Notes (Signed)
Cardiology Office Note Date:  08/09/2016   ID:  Alfred [name], DOB 03/29/1951, MRN [id number]  PCP:  No PCP Per Patient  Cardiologist:  Sherren Mocha, MD    Chief Complaint  Patient presents with  . Essential hypertension, benign     History of Present Illness: Alfred [name] is a 66 y.o. male who presents for follow-up of coronary artery disease, hypertension, and hyperlipidemia. He has a hx of abdominal aortic aneurysm and underwent endovascular repair in 2013. Because of an infected graft, he had to undergo open abdominal aortic aneurysm repair with a cadaveric graft.  The patient was last seen in our office in 2016. I haven't seen him since November 2015. He is here alone today. He denies symptoms of chest pain, shortness of breath, heart palpitations, orthopnea, or PND. He admits to intermittent leg swelling. He's gained a lot of weight. He hasn't been exercising and has not been following a prudent diet. 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 AAA (abdominal aortic aneurysm) ([place]) 08/2011   see Surgical history  . Anginal pain ([place])   . Arthritis    "knees; not bad"  . CAD (coronary artery disease)    DES to RCA and CFX; Dr. Burt Knack cardiologist  . Depression ~ 2009   'treated for ~ 6 months"  . Dyslipidemia   . HTN (hypertension)    unspecified essential  . Hx of adenomatous colonic polyps 2008  . Hyperplasia of prostate without lower urinary tract symptoms (LUTS)   . Left lumbar radiculopathy   . Other and unspecified hyperlipidemia   . Stroke [facility])     Past Surgical History:  Procedure Laterality Date  . ABDOMINAL AORTIC ANEURYSM REPAIR  09/06/2011  . ABDOMINAL AORTIC ANEURYSM REPAIR  01/21/2012   Procedure: ANEURYSM ABDOMINAL AORTIC REPAIR;  Surgeon: Elam Dutch, MD;  Location: [place];  Service: Vascular;  Laterality: N/A;  Aortio- bi-iliac repair  using Cryolife  descending thoracic aorta 31mm x10.5 cm.  . APPENDECTOMY  1970  . ARTERY REPAIR  09/06/2011   Procedure: ARTERY REPAIR;  Surgeon: Elam Dutch, MD;  Location: [place];  Service: Vascular;  Laterality: Bilateral;  . COLONOSCOPY W/ POLYPECTOMY  2008   adenomatous  & hyperplastic polyp;due 2013  . CORONARY ANGIOPLASTY WITH STENT PLACEMENT  2002; 2005   "2 +1; total of 3"  . CYSTOSCOPY  2005   varicose veins in bladder, Dr Diona Fanti  . [place]  . VASECTOMY      Current Outpatient Prescriptions  Medication Sig Dispense Refill  . acetaminophen (TYLENOL) 500 MG tablet Take 500 mg by mouth every 6 (six) hours as needed for mild pain. PRN for pain/fever    . aspirin 81 MG tablet Take 81 mg by mouth at bedtime.     Marland Kitchen atorvastatin (LIPITOR) 10 MG tablet Take 1 tablet (10 mg total) by mouth daily at 6 PM. Please call office to schedule appointment for further refills. Thanks! Attempt #1 30 tablet 0  . clopidogrel (PLAVIX) 75 MG tablet TAKE 1 TABLET BY MOUTH EVERY DAY 30 tablet 1  . famotidine (PEPCID) 20 MG tablet Take 20 mg by mouth daily as needed for heartburn or indigestion.     Marland Kitchen losartan (COZAAR) 100 MG tablet Take 1 tablet (100 mg total) by mouth daily.    . metoprolol succinate (TOPROL-XL) 50 MG 24  hr tablet TAKE 1 TABLET (50 MG TOTAL) BY MOUTH DAILY. TAKE WITH OR IMMEDIATELY FOLLOWING A MEAL. 90 tablet 3  . hydrochlorothiazide (HYDRODIURIL) 25 MG tablet Take 1 tablet (25 mg total) by mouth daily. 30 tablet 3   No current facility-administered medications for this visit.     Allergies:   Patient has no known allergies.   Social History:  The patient  reports that he quit smoking about 23 years ago. His smoking use included Cigarettes. He has a 27.00 pack-year smoking history. His smokeless tobacco use includes Snuff. He reports that he drinks about 12.6 oz of alcohol per week . He reports that he does not use drugs.   Family History:  The patient's  family  history includes Colon polyps in his father; Coronary artery disease in his father, maternal grandfather, and paternal grandfather; Diabetes in his father; Hypertension in his father; Lung cancer in his father; Other in his mother.    ROS:  Please see the history of present illness.  All other systems are reviewed and negative.    PHYSICAL EXAM: VS:  BP (!) 152/100   Pulse 67   Ht 6' (1.829 m)   Wt (!) 316 lb 12.8 oz (143.7 kg)   BMI 42.97 kg/m  , BMI Body mass index is 42.97 kg/m. GEN: Well nourished, well developed, pleasant obese male in no acute distress  HEENT: normal  Neck: no JVD, no masses. No carotid bruits Cardiac: RRR without murmur or gallop                Respiratory:  clear to auscultation bilaterally, normal work of breathing GI: soft, nontender, nondistended, + BS MS: no deformity or atrophy  Ext: no pretibial edema, pedal pulses 2+= bilaterally Skin: warm and dry, no rash Neuro:  Strength and sensation are intact Psych: euthymic mood, full affect  EKG:  EKG is ordered today. The ekg ordered today shows NSR 67 bpm, within normal limits  Recent Labs: 01/07/2016: BUN 17; Creat 1.07   Lipid Panel     Component Value Date/Time   CHOL 130 04/14/2015 0952   TRIG 82 04/14/2015 0952   TRIG 110 05/26/2006 1118   HDL 43 04/14/2015 0952   CHOLHDL 3.0 04/14/2015 0952   VLDL 16 04/14/2015 0952   LDLCALC 71 04/14/2015 0952   LDLDIRECT 78.1 01/15/2013 0747      Wt Readings from Last 3 Encounters:  08/09/16 (!) 316 lb 12.8 oz (143.7 kg)  01/22/16 (!) 306 lb 6.4 oz (139 kg)  04/15/15 299 lb (135.6 kg)     Cardiac Studies Reviewed: Myoview Stress Test 04-16-2015: Study Highlights    The left ventricular ejection fraction is normal (55-65%).  Nuclear stress EF: 56%.  There was no ST segment deviation noted during stress.  The study is normal. No evidence of ischemia  This is a low risk study.   ASSESSMENT AND PLAN: 1.  Coronary artery disease, native  vessel, without symptoms of angina. Medications reviewed today. He is on aspirin, clopidogrel, and a statin drug at a dose he is able to tolerate.  2. Hypertension, uncontrolled: Lifestyle is a major factor. The patient is drinking alcohol, has gained a great deal of weight, and is not following a prudent diet. We discussed lifestyle modification at length. He is going to work on weight loss. Will increase losartan to 100 mg and add hydrochlorothiazide 25 mg in combination with losartan. Continue metoprolol succinate 50 mg daily.  3. Hyperlipidemia. He is stable  on atorvastatin. He is due for fasting lipids and these will be drawn in a few weeks when he returns for blood work. Lifestyle modification reviewed.  4. Abdominal aortic aneurysm status post surgical repair. Followed by Dr. Oneida Alar.  Current medicines are reviewed with the patient today.  The patient does not have concerns regarding medicines.  Labs/ tests ordered today include:   Orders Placed This Encounter  Procedures  . EKG 12-Lead    Disposition:   FU 6 months  Signed, Sherren Mocha, MD  08/09/2016 10:14 AM    [address], [place], [place]  [address] Phone: [phone]; Fax: [phone]

## 2016-08-23 ENCOUNTER — Other Ambulatory Visit: Payer: PPO

## 2016-08-25 ENCOUNTER — Other Ambulatory Visit: Payer: Self-pay | Admitting: Cardiovascular Disease

## 2016-08-27 ENCOUNTER — Other Ambulatory Visit: Payer: PPO

## 2016-09-10 ENCOUNTER — Other Ambulatory Visit: Payer: PPO | Admitting: *Deleted

## 2016-09-10 DIAGNOSIS — I2510 Atherosclerotic heart disease of native coronary artery without angina pectoris: Secondary | ICD-10-CM

## 2016-09-10 DIAGNOSIS — I10 Essential (primary) hypertension: Secondary | ICD-10-CM

## 2016-09-10 LAB — COMPREHENSIVE METABOLIC PANEL
ALT: 79 IU/L — AB (ref 0–44)
AST: 55 IU/L — AB (ref 0–40)
Albumin/Globulin Ratio: 1.6 (ref 1.2–2.2)
Albumin: 4.2 g/dL (ref 3.6–4.8)
Alkaline Phosphatase: 96 IU/L (ref 39–117)
BUN/Creatinine Ratio: 17 (ref 10–24)
BUN: 19 mg/dL (ref 8–27)
Bilirubin Total: 0.7 mg/dL (ref 0.0–1.2)
CALCIUM: 9.3 mg/dL (ref 8.6–10.2)
CO2: 26 mmol/L (ref 18–29)
Chloride: 100 mmol/L (ref 96–106)
Creatinine, Ser: 1.09 mg/dL (ref 0.76–1.27)
GFR, EST AFRICAN AMERICAN: 82 mL/min/{1.73_m2} (ref >59)
GFR, EST NON AFRICAN AMERICAN: 71 mL/min/{1.73_m2} (ref >59)
GLUCOSE: 108 mg/dL — AB (ref 65–99)
Globulin, Total: 2.7 g/dL (ref 1.5–4.5)
POTASSIUM: 4.7 mmol/L (ref 3.5–5.2)
Sodium: 142 mmol/L (ref 134–144)
TOTAL PROTEIN: 6.9 g/dL (ref 6.0–8.5)

## 2016-09-10 LAB — LIPID PANEL
CHOL/HDL RATIO: 3 ratio (ref 0.0–5.0)
Cholesterol, Total: 127 mg/dL (ref 100–199)
HDL: 42 mg/dL (ref >39)
LDL Calculated: 66 mg/dL (ref 0–99)
Triglycerides: 95 mg/dL (ref 0–149)
VLDL Cholesterol Cal: 19 mg/dL (ref 5–40)

## 2016-09-10 LAB — CBC
HEMATOCRIT: 47.1 % (ref 37.5–51.0)
HEMOGLOBIN: 15.7 g/dL (ref 13.0–17.7)
MCH: 32.8 pg (ref 26.6–33.0)
MCHC: 33.3 g/dL (ref 31.5–35.7)
MCV: 99 fL — ABNORMAL HIGH (ref 79–97)
Platelets: 174 10*3/uL (ref 150–379)
RBC: 4.78 x10E6/uL (ref 4.14–5.80)
RDW: 13.5 % (ref 12.3–15.4)
WBC: 4.3 10*3/uL (ref 3.4–10.8)

## 2016-09-14 ENCOUNTER — Other Ambulatory Visit: Payer: Self-pay | Admitting: Cardiovascular Disease

## 2016-09-21 ENCOUNTER — Encounter: Payer: Self-pay | Admitting: Cardiovascular Disease

## 2016-09-21 NOTE — Telephone Encounter (Signed)
This encounter was created in error - please disregard.

## 2016-09-21 NOTE — Telephone Encounter (Signed)
New message    Pt returning your call about lab work

## 2016-09-28 ENCOUNTER — Other Ambulatory Visit: Payer: Self-pay | Admitting: Cardiovascular Disease

## 2016-10-21 ENCOUNTER — Other Ambulatory Visit: Payer: Self-pay | Admitting: Cardiology

## 2016-10-21 NOTE — Telephone Encounter (Signed)
Rx has been sent to the pharmacy electronically. ° °

## 2016-11-17 ENCOUNTER — Other Ambulatory Visit: Payer: Self-pay | Admitting: Cardiovascular Disease

## 2016-11-17 MED ORDER — HYDROCHLOROTHIAZIDE 25 MG PO TABS: 25.0000 mg | ORAL_TABLET | Freq: Every day | ORAL | 2 refills | Status: DC

## 2017-02-11 ENCOUNTER — Ambulatory Visit: Payer: PPO | Admitting: Cardiovascular Disease

## 2017-02-21 ENCOUNTER — Other Ambulatory Visit: Payer: Self-pay | Admitting: Cardiovascular Disease

## 2017-03-07 ENCOUNTER — Ambulatory Visit (INDEPENDENT_AMBULATORY_CARE_PROVIDER_SITE_OTHER): Payer: PPO | Admitting: Cardiovascular Disease

## 2017-03-07 ENCOUNTER — Encounter: Payer: Self-pay | Admitting: Cardiovascular Disease

## 2017-03-07 VITALS — BP 146/100 | HR 63 | Ht 72.0 in | Wt 303.4 lb

## 2017-03-07 DIAGNOSIS — I10 Essential (primary) hypertension: Secondary | ICD-10-CM | POA: Diagnosis not present

## 2017-03-07 DIAGNOSIS — E785 Hyperlipidemia, unspecified: Secondary | ICD-10-CM | POA: Diagnosis not present

## 2017-03-07 DIAGNOSIS — I2510 Atherosclerotic heart disease of native coronary artery without angina pectoris: Secondary | ICD-10-CM

## 2017-03-07 MED ORDER — AMLODIPINE BESYLATE 10 MG PO TABS: 10.0000 mg | ORAL_TABLET | Freq: Every day | ORAL | 3 refills | Status: DC

## 2017-03-07 MED ORDER — LOSARTAN POTASSIUM 100 MG PO TABS: 100.0000 mg | ORAL_TABLET | Freq: Every day | ORAL | 3 refills | Status: DC

## 2017-03-07 NOTE — Progress Notes (Signed)
Cardiology Office Note Date:  03/07/2017   ID:  Alfred [name], DOB 1950-08-14, MRN [id number]  PCP:  Patient, No Pcp Per  Cardiologist:  Sherren Mocha, MD    Chief Complaint  Patient presents with  . Follow-up    HTN     History of Present Illness: Alfred [name] is a 66 y.o. male who presents for follow-up of HTN, CAD, and hyperlipidemia.   He's here alone today. 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 His BP has been running too high. He is still drinking alcohol in excess unchanged from his normal intake. Admits he hasn't been eating well. Lots of stress.   Today, he denies symptoms of palpitations, chest pain, shortness of breath, orthopnea, PND, lower extremity edema, dizziness, or syncope.  Past Medical History:  Diagnosis Date  . AAA (abdominal aortic aneurysm) ([place]) 08/2011   see Surgical history  . Anginal pain ([place])   . Arthritis    "knees; not bad"  . CAD (coronary artery disease)    DES to RCA and CFX; Dr. Burt Knack cardiologist  . Depression ~ 2009   'treated for ~ 6 months"  . Dyslipidemia   . HTN (hypertension)    unspecified essential  . Hx of adenomatous colonic polyps 2008  . Hyperplasia of prostate without lower urinary tract symptoms (LUTS)   . Left lumbar radiculopathy   . Other and unspecified hyperlipidemia   . Stroke [facility])     Past Surgical History:  Procedure Laterality Date  . ABDOMINAL AORTIC ANEURYSM REPAIR  09/06/2011  . ABDOMINAL AORTIC ANEURYSM REPAIR  01/21/2012   Procedure: ANEURYSM ABDOMINAL AORTIC REPAIR;  Surgeon: Elam Dutch, MD;  Location: [place];  Service: Vascular;  Laterality: N/A;  Aortio- bi-iliac repair  using Cryolife descending thoracic aorta 77m x10.5 cm.  . APPENDECTOMY  1970  . ARTERY REPAIR  09/06/2011   Procedure: ARTERY REPAIR;  Surgeon: CElam Dutch MD;  Location: [place]  Service: Vascular;  Laterality: Bilateral;  . COLONOSCOPY W/ POLYPECTOMY  2008   adenomatous  & hyperplastic polyp;due 2013  .  CORONARY ANGIOPLASTY WITH STENT PLACEMENT  2002; 2005   "2 +1; total of 3"  . CYSTOSCOPY  2005   varicose veins in bladder, Dr dDiona Fanti . [facility] . VASECTOMY      Current Outpatient Prescriptions  Medication Sig Dispense Refill  . acetaminophen (TYLENOL) 500 MG tablet Take 500 mg by mouth every 6 (six) hours as needed for mild pain. PRN for pain/fever    . aspirin 81 MG tablet Take 81 mg by mouth at bedtime.     .Marland Kitchenatorvastatin (LIPITOR) 10 MG tablet TAKE 1 TABLET BY MOUTH DAILY AT 6 PM. PLEASE CALL OFFICE TO SCHEDULE APPOINTMENT FOR FURTHER REFILLS 30 tablet 11  . clopidogrel (PLAVIX) 75 MG tablet TAKE 1 TABLET BY MOUTH EVERY DAY 30 tablet 9  . famotidine (PEPCID) 20 MG tablet Take 20 mg by mouth daily as needed for heartburn or indigestion.     .Marland Kitchenlosartan (COZAAR) 100 MG tablet Take 1 tablet (100 mg total) by mouth daily. 90 tablet 3  . metoprolol succinate (TOPROL-XL) 50 MG 24 hr tablet TAKE 1 TABLET (50 MG TOTAL) BY MOUTH DAILY. TAKE WITH OR IMMEDIATELY FOLLOWING A MEAL. 90 tablet 2  . amLODipine (NORVASC) 10 MG tablet Take 1 tablet (10 mg total) by mouth daily. 90 tablet 3   No current facility-administered medications for this visit.  Allergies:   Patient has no known allergies.   Social History:  The patient  reports that he quit smoking about 23 years ago. His smoking use included Cigarettes. He has a 27.00 pack-year smoking history. His smokeless tobacco use includes Snuff. He reports that he drinks about 12.6 oz of alcohol per week . He reports that he does not use drugs.   Family History:  The patient's family history includes Colon polyps in his father; Coronary artery disease in his father, maternal grandfather, and paternal grandfather; Diabetes in his father; Hypertension in his father; Lung cancer in his father; Other in his mother.    ROS:  Please see the history of present illness.  All other systems are reviewed and negative.     PHYSICAL EXAM: VS:  BP (!) 146/100   Pulse 63   Ht 6' (1.829 m)   Wt (!) 303 lb 6.4 oz (137.6 kg)   BMI 41.15 kg/m  , BMI Body mass index is 41.15 kg/m. GEN: Well nourished, well developed, pleasant obese male in no acute distress  HEENT: normal  Neck: no JVD, no masses. No carotid bruits Cardiac: RRR without murmur or gallop                Respiratory:  clear to auscultation bilaterally, normal work of breathing GI: soft, nontender, nondistended, + BS MS: no deformity or atrophy  Ext: no pretibial edema, pedal pulses 2+= bilaterally Skin: warm and dry, no rash Neuro:  Strength and sensation are intact Psych: euthymic mood, full affect  EKG:  EKG is ordered today. The ekg ordered today shows NSR 63 bpm, minimal voltage criteria for LVH, otherwise within normal limits  Recent Labs: 09/10/2016: ALT 79; BUN 19; Creatinine, Ser 1.09; Hemoglobin 15.7; Platelets 174; Potassium 4.7; Sodium 142   Lipid Panel     Component Value Date/Time   CHOL 127 09/10/2016 0813   TRIG 95 09/10/2016 0813   TRIG 110 05/26/2006 1118   HDL 42 09/10/2016 0813   CHOLHDL 3.0 09/10/2016 0813   CHOLHDL 3.0 04/14/2015 0952   VLDL 16 04/14/2015 0952   LDLCALC 66 09/10/2016 0813   LDLDIRECT 78.1 01/15/2013 0747      Wt Readings from Last 3 Encounters:  03/07/17 (!) 303 lb 6.4 oz (137.6 kg)  08/09/16 (!) 316 lb 12.8 oz (143.7 kg)  01/22/16 (!) 306 lb 6.4 oz (139 kg)     Cardiac Studies Reviewed: Myoview 04-16-2015: Study Highlights    The left ventricular ejection fraction is normal (55-65%).  Nuclear stress EF: 56%.  There was no ST segment deviation noted during stress.  The study is normal. No evidence of ischemia  This is a low risk study.   ASSESSMENT AND PLAN: 1.  CAD, native vessel, without angina 2. HTN, uncontrolled 3. Hyperlipidemia: treated with atorvastatin 4. AAA s/p repair, followed by [facility] (Dr Oneida Alar)  The patient has uncontrolled HTN, and he admits to  noncompliance with diet, exercise, alcohol, and salt intake. I reviewed his medications and will add amlodipine 10 mg daily. He was unable to tolerate a thiazide diuretic because he felt so poor after he would work outside. He continues on losartan and metoprolol succinate. I would like him to follow-up in the [facility] HTN Clinic in 3-4 weeks.   Will update his LFT's as transaminases have been chronically elevated, likely a combination of obesity (steatohepatitis) and alcohol.   Will check an exercise treadmill study to meet DOT requirement of a cardiac functional study every  other year.   Current medicines are reviewed with the patient today.  The patient does not have concerns regarding medicines.  Labs/ tests ordered today include:   Orders Placed This Encounter  Procedures  . Comp Met (CMET)  . EXERCISE TOLERANCE TEST (ETT)  . EKG 12-Lead    Disposition:   FU 6 months  Signed, Sherren Mocha, MD  03/07/2017 5:53 PM    [facility] [address], [place], [place]  [address] Phone: [phone]; Fax: [phone]

## 2017-03-07 NOTE — Patient Instructions (Addendum)
Medication Instructions:  1) START NORVASC (Amlodipine) 10 mg daily  Labwork: TODAY: CMET  Testing/Procedures: Your provider has requested that you have an exercise tolerance test in 3-4 weeks. For further information please visit [email or web address]. Please also follow instruction sheet, as given.   Follow-Up: Your provider recommends that you schedule a follow-up appointment in the HTN CLINIC in 3-4 weeks (the same day as your stress test).  Your provider wants you to follow-up in: 6 months with Dr. Burt Knack. You will receive a reminder letter in the mail two months in advance. If you don't receive a letter, please call our office to schedule the follow-up appointment.    Any Other Special Instructions Will Be Listed Below (If Applicable).     If you need a refill on your cardiac medications before your next appointment, please call your pharmacy.

## 2017-03-08 LAB — COMPREHENSIVE METABOLIC PANEL
A/G RATIO: 1.7 (ref 1.2–2.2)
ALT: 68 IU/L — ABNORMAL HIGH (ref 0–44)
AST: 50 IU/L — AB (ref 0–40)
Albumin: 4.4 g/dL (ref 3.6–4.8)
Alkaline Phosphatase: 89 IU/L (ref 39–117)
BILIRUBIN TOTAL: 0.7 mg/dL (ref 0.0–1.2)
BUN/Creatinine Ratio: 15 (ref 10–24)
BUN: 14 mg/dL (ref 8–27)
CALCIUM: 9.4 mg/dL (ref 8.6–10.2)
CHLORIDE: 101 mmol/L (ref 96–106)
CO2: 23 mmol/L (ref 20–29)
Creatinine, Ser: 0.96 mg/dL (ref 0.76–1.27)
GFR, EST AFRICAN AMERICAN: 95 mL/min/{1.73_m2} (ref >59)
GFR, EST NON AFRICAN AMERICAN: 83 mL/min/{1.73_m2} (ref >59)
GLOBULIN, TOTAL: 2.6 g/dL (ref 1.5–4.5)
Glucose: 98 mg/dL (ref 65–99)
POTASSIUM: 4.7 mmol/L (ref 3.5–5.2)
SODIUM: 140 mmol/L (ref 134–144)
TOTAL PROTEIN: 7 g/dL (ref 6.0–8.5)

## 2017-03-10 ENCOUNTER — Ambulatory Visit: Payer: PPO | Admitting: Cardiovascular Disease

## 2017-03-30 ENCOUNTER — Encounter: Payer: PPO | Admitting: Pharmacist Clinician (PhC)/ Clinical Pharmacy Specialist

## 2017-03-30 ENCOUNTER — Ambulatory Visit (INDEPENDENT_AMBULATORY_CARE_PROVIDER_SITE_OTHER): Payer: PPO

## 2017-03-30 DIAGNOSIS — I2510 Atherosclerotic heart disease of native coronary artery without angina pectoris: Secondary | ICD-10-CM | POA: Diagnosis not present

## 2017-03-30 LAB — EXERCISE TOLERANCE TEST
CSEPED: 6 min
CSEPEDS: 0 s
CSEPEW: 7 METS
CSEPHR: 91 %
CSEPPHR: 141 {beats}/min
MPHR: 154 {beats}/min
RPE: 16
Rest HR: 71 {beats}/min

## 2017-03-30 NOTE — Progress Notes (Signed)
Patient in office for stress test and BP visit.  Left after stress test - not seen by PharmD This encounter was created in error - please disregard.

## 2017-03-31 ENCOUNTER — Telehealth: Payer: Self-pay | Admitting: Cardiovascular Disease

## 2017-03-31 NOTE — Telephone Encounter (Signed)
Mr.Gengler is calling to have the results of his Stress Test faxed to [facility] in [place] . 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 Thanks

## 2017-04-13 ENCOUNTER — Encounter: Payer: Self-pay | Admitting: Cardiovascular Disease
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
## 2017-04-26 ENCOUNTER — Ambulatory Visit (INDEPENDENT_AMBULATORY_CARE_PROVIDER_SITE_OTHER): Payer: PPO | Admitting: Pharmacist

## 2017-04-26 VITALS — BP 160/88 | HR 70 | Wt 305.0 lb

## 2017-04-26 DIAGNOSIS — I10 Essential (primary) hypertension: Secondary | ICD-10-CM | POA: Diagnosis not present

## 2017-04-26 MED ORDER — SPIRONOLACTONE 25 MG PO TABS: 25.0000 mg | ORAL_TABLET | Freq: Every day | ORAL | 3 refills | Status: DC

## 2017-04-26 NOTE — Patient Instructions (Signed)
It was great to see you today!  1. Start taking spironolactone 25mg  daily  2. Continue taking all of your other medications  3. Return to clinic next week for lab work  4. Follow up in clinic in 3-4 weeks

## 2017-05-04 ENCOUNTER — Other Ambulatory Visit: Payer: PPO

## 2017-05-10 ENCOUNTER — Other Ambulatory Visit: Payer: PPO

## 2017-05-19 ENCOUNTER — Ambulatory Visit (INDEPENDENT_AMBULATORY_CARE_PROVIDER_SITE_OTHER): Payer: PPO | Admitting: Pharmacist

## 2017-05-19 DIAGNOSIS — I10 Essential (primary) hypertension: Secondary | ICD-10-CM | POA: Diagnosis not present

## 2017-05-19 LAB — BASIC METABOLIC PANEL
BUN / CREAT RATIO: 15 (ref 10–24)
BUN: 16 mg/dL (ref 8–27)
CHLORIDE: 101 mmol/L (ref 96–106)
CO2: 25 mmol/L (ref 20–29)
Calcium: 9.4 mg/dL (ref 8.6–10.2)
Creatinine, Ser: 1.06 mg/dL (ref 0.76–1.27)
GFR, EST AFRICAN AMERICAN: 84 mL/min/{1.73_m2} (ref >59)
GFR, EST NON AFRICAN AMERICAN: 73 mL/min/{1.73_m2} (ref >59)
Glucose: 91 mg/dL (ref 65–99)
POTASSIUM: 4.8 mmol/L (ref 3.5–5.2)
SODIUM: 139 mmol/L (ref 134–144)

## 2017-05-19 NOTE — Progress Notes (Signed)
Patient ID: ALTA SHOBER                 DOB: August 05, 1950                      MRN: [id number]     HPI: Alfred [name] is a 66 y.o. male referred by Dr. Burt Knack to HTN clinic. PMH is significant for HTN, CAD with DES to RCA and CFX, HLD, stroke, and AAA with endovascular repair in 2013. At his last hypertension visit, BP was elevated at 160/88 and he was started on spironolactone 25mg  daily. He did not show for his follow up lab work.  Pt presents today in good spirits. He is tolerating his medications well. He has not checked his BP at all at home. Denies dizziness, blurred vision, falls, or headaches. He took his BP medications 3 hours ago and spaces apart his HTN medications to take 2 in the morning and 2 in the evening. He had 1/2 cup of decaf coffee this morning. He states that he knows he needs to lose a lot of weight, drink less, exercise more, and start eating healthier. 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   Current HTN meds: amlodipine 10mg  daily (AM), losartan 100mg  daily (PM), metoprolol succinate 50mg  daily (PM), spironolactone 25mg  daily (AM) Previously tried: HCTZ 25mg  daily (fatigue) BP goal: < 130/80 mmHg  Family History: HTN, CAD, DM, colon polyps, and lung cancer in father. CAD in maternal grandfather and paternal grandfather.  Social History: Former smoker, quit in 1995 with a 27 pack-year history. Continues to use Snuff 1 can/day. Reports that he drinks about 2-3 shots of Vodka per night with an occasional beer. Denies illicit drug use.  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 Might stop at fast food once per day, has to eat whatever he can find. Doesn't eat biscuits and gravy anymore. Adds salt to his food but is trying to limit intake. Drinks: two 8oz cups of coffee (sometimes  more if he is driving) and water.  Exercise: Doesn't exercise due to his job.  Home BP readings: Does not check at home  Wt Readings from Last 3 Encounters:  04/26/17 (!) 305 lb (138.3 kg)  03/07/17 (!) 303 lb 6.4 oz (137.6 kg)  08/09/16 (!) 316 lb 12.8 oz (143.7 kg)   BP Readings from Last 3 Encounters:  04/26/17 (!) 160/88  03/07/17 (!) 146/100  08/09/16 (!) 152/100   Pulse Readings from Last 3 Encounters:  04/26/17 70  03/07/17 63  08/09/16 67    Renal function: CrCl cannot be calculated (Patient's most recent lab result is older than the maximum 21 days allowed.).  Past Medical History:  Diagnosis Date  . AAA (abdominal aortic aneurysm) ([place]) 08/2011   see Surgical history  . Anginal pain ([place])   . Arthritis    "knees; not bad"  . CAD (coronary artery disease)    DES to RCA and CFX; Dr. Burt Knack cardiologist  . Depression ~ 2009   'treated for ~ 6 months"  . Dyslipidemia   . HTN (hypertension)    unspecified essential  . Hx of adenomatous colonic polyps 2008  . Hyperplasia of prostate without lower urinary tract symptoms (LUTS)   .  Left lumbar radiculopathy   . Other and unspecified hyperlipidemia   . Stroke [facility])     Current Outpatient Medications on File Prior to Visit  Medication Sig Dispense Refill  . acetaminophen (TYLENOL) 500 MG tablet Take 500 mg by mouth every 6 (six) hours as needed for mild pain. PRN for pain/fever    . amLODipine (NORVASC) 10 MG tablet Take 1 tablet (10 mg total) by mouth daily. 90 tablet 3  . aspirin 81 MG tablet Take 81 mg by mouth at bedtime.     Marland Kitchen atorvastatin (LIPITOR) 10 MG tablet TAKE 1 TABLET BY MOUTH DAILY AT 6 PM. PLEASE CALL OFFICE TO SCHEDULE APPOINTMENT FOR FURTHER REFILLS 30 tablet 11  . clopidogrel (PLAVIX) 75 MG tablet TAKE 1 TABLET BY MOUTH EVERY DAY 30 tablet 9  . famotidine (PEPCID) 20 MG tablet Take 20 mg by mouth daily as needed for heartburn or indigestion.     Marland Kitchen losartan (COZAAR) 100 MG tablet Take 1 tablet  (100 mg total) by mouth daily. 90 tablet 3  . metoprolol succinate (TOPROL-XL) 50 MG 24 hr tablet TAKE 1 TABLET (50 MG TOTAL) BY MOUTH DAILY. TAKE WITH OR IMMEDIATELY FOLLOWING A MEAL. 90 tablet 2  . spironolactone (ALDACTONE) 25 MG tablet Take 1 tablet (25 mg total) by mouth daily. 30 tablet 3   No current facility-administered medications on file prior to visit.     No Known Allergies   Assessment/Plan:  1. Hypertension - BP much improved and very close to goal <130/28mmHg since starting spironolactone at last visit. Pt prefers to focus on lifestyle changes rather than increasing spironolactone today. Will continue current medications - amlodipine 10mg  daily, losartan 100mg  daily, metoprolol succinate 50mg  daily, and spironolactone 25mg  daily. Checking BMET today with recent spironolactone start. 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 F/u in HTN clinic in 2 months to assess efficacy of lifestyle improvements.    Fadia Marlar E. Deanna Wiater, PharmD, CPP, [facility] [address] [place], [place] [address] Phone: [phone]; Fax: [phone] 05/19/2017 10:13 AM

## 2017-05-19 NOTE — Patient Instructions (Addendum)
It was nice to see you today  We will continue your current medications  Try to limit your salt intake and stay active  Follow up in clinic in 2 months on Tuesday February 26th at 8:30am

## 2017-06-21 ENCOUNTER — Encounter: Payer: Self-pay | Admitting: Cardiovascular Disease

## 2017-06-21 MED ORDER — CHLORTHALIDONE 25 MG PO TABS: 25.0000 mg | ORAL_TABLET | Freq: Every day | ORAL | 11 refills | Status: DC

## 2017-06-21 NOTE — Telephone Encounter (Signed)
Reviewed with Megan Supple, pharmD. Per pharmD, instructed patient to STOP spironolactone and START chlorthalidone 25 mg daily. Scheduled patient in the HTN Clinic 2/6. He understands BMET will be drawn that day. He was grateful for call and agrees with treatment plan.

## 2017-07-06 ENCOUNTER — Ambulatory Visit: Payer: PPO

## 2017-07-06 ENCOUNTER — Ambulatory Visit (INDEPENDENT_AMBULATORY_CARE_PROVIDER_SITE_OTHER): Payer: PPO | Admitting: Pharmacist

## 2017-07-06 ENCOUNTER — Encounter: Payer: Self-pay | Admitting: Pharmacist

## 2017-07-06 VITALS — BP 122/70 | HR 66

## 2017-07-06 DIAGNOSIS — I10 Essential (primary) hypertension: Secondary | ICD-10-CM | POA: Diagnosis not present

## 2017-07-06 LAB — BASIC METABOLIC PANEL
BUN/Creatinine Ratio: 17 (ref 10–24)
BUN: 21 mg/dL (ref 8–27)
CALCIUM: 9.3 mg/dL (ref 8.6–10.2)
CHLORIDE: 97 mmol/L (ref 96–106)
CO2: 27 mmol/L (ref 20–29)
CREATININE: 1.27 mg/dL (ref 0.76–1.27)
GFR calc Af Amer: 68 mL/min/{1.73_m2} (ref >59)
GFR calc non Af Amer: 58 mL/min/{1.73_m2} — ABNORMAL LOW (ref >59)
GLUCOSE: 96 mg/dL (ref 65–99)
Potassium: 3.5 mmol/L (ref 3.5–5.2)
Sodium: 137 mmol/L (ref 134–144)

## 2017-07-06 NOTE — Patient Instructions (Addendum)
Return for a follow up appointment as scheduled Dr. Burt Knack  Check your blood pressure at home daily (if able) and keep record of the readings.  Take your BP meds as follows: Continue all medications as prescribed   Bring all of your meds, your BP cuff and your record of home blood pressures to your next appointment.  Exercise as you're able, try to walk approximately 30 minutes per day.  Keep salt intake to a minimum, especially watch canned and prepared boxed foods.  Eat more fresh fruits and vegetables and fewer canned items.  Avoid eating in fast food restaurants.    HOW TO TAKE YOUR BLOOD PRESSURE: . Rest 5 minutes before taking your blood pressure. .  Don't smoke or drink caffeinated beverages for at least 30 minutes before. . Take your blood pressure before (not after) you eat. . Sit comfortably with your back supported and both feet on the floor (don't cross your legs). . Elevate your arm to heart level on a table or a desk. . Use the proper sized cuff. It should fit smoothly and snugly around your bare upper arm. There should be enough room to slip a fingertip under the cuff. The bottom edge of the cuff should be 1 inch above the crease of the elbow. . Ideally, take 3 measurements at one sitting and record the average.

## 2017-07-06 NOTE — Progress Notes (Signed)
Patient ID: Alfred [name]                 DOB: [birth year]                      MRN: [id number]     HPI: Alfred [name] is a 67 y.o. male patient of Dr. Burt Knack who presents today for hypertension follow up. PMH significant for HTN, CAD with DES to RCA and CFX, HLD, stroke, and AAA with endovascular repair in 2013. At his last visit in HTN clinic his pressure was controlled and no medication changes were made. However, he emailed in about the spironolactone causing GI discomfort. Spironolactone was stopped and he was started on chlorthalidone.   He presents today in good spirits. He reports that he is about to leave town for work and is headed to [place]. He reports that he is doing well on his current regimen. He states he can tell when his pressure is up and he has not felt that way since the medication change. He denies SOB, chest pain, and dizziness. He does report some aching/cramping in his knees, but he is unsure if this is related to the medication.    Current HTN meds:  Amlodipine 10mg  daily Chlorthalidone 25mg  daily Losartan 100mg  daily  Metoprolol succinate 50mg  daily   Previously tried:HCTZ 25mg  daily (fatigue) BP goal:< 130/80 mmHg  Family History:HTN, CAD, DM, colon polyps, and lung cancer in father. CAD in maternal grandfather and paternal grandfather.  Social History:Former smoker, quit in 1995 with a 27 pack-year history. Continues to use Snuff1 can/day. Reports that he drinks about 2-3 shots of Vodka per night with an occasional beer. Denies illicit drug use.  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 Might stop at fast food once per day, has to eat whatever he can find. Doesn't eat biscuits and gravy anymore. Addssalt to his food but is trying to limit intake. Drinks:two 8oz cups of coffee (sometimes more if he is driving) and water.  Exercise:Doesn't exercise due to his job.  Home BP readings:Does not check at home  Wt Readings from Last 3  Encounters:  04/26/17 (!) 305 lb (138.3 kg)  03/07/17 (!) 303 lb 6.4 oz (137.6 kg)  08/09/16 (!) 316 lb 12.8 oz (143.7 kg)   BP Readings from Last 3 Encounters:  07/06/17 122/70  05/19/17 130/84  04/26/17 (!) 160/88   Pulse Readings from Last 3 Encounters:  07/06/17 66  05/19/17 65  04/26/17 70    Renal function: CrCl cannot be calculated (Patient's most recent lab result is older than the maximum 21 days allowed.).  Past Medical History:  Diagnosis Date  . AAA (abdominal aortic aneurysm) ([place]) 08/2011   see Surgical history  . Anginal pain ([place])   . Arthritis    "knees; not bad"  . CAD (coronary artery disease)    DES to RCA and CFX; Dr. Burt Knack cardiologist  . Depression ~ 2009   'treated for ~ 6 months"  . Dyslipidemia   . HTN (hypertension)    unspecified essential  . Hx of adenomatous colonic polyps 2008  . Hyperplasia of prostate without lower urinary tract symptoms (LUTS)   . Left lumbar radiculopathy   . Other and unspecified hyperlipidemia   . Stroke [facility])     Current Outpatient Medications on File Prior to Visit  Medication Sig Dispense Refill  . acetaminophen (TYLENOL) 500 MG tablet Take 500 mg by mouth every  6 (six) hours as needed for mild pain. PRN for pain/fever    . amLODipine (NORVASC) 10 MG tablet Take 1 tablet (10 mg total) by mouth daily. 90 tablet 3  . aspirin 81 MG tablet Take 81 mg by mouth at bedtime.     Marland Kitchen atorvastatin (LIPITOR) 10 MG tablet TAKE 1 TABLET BY MOUTH DAILY AT 6 PM. PLEASE CALL OFFICE TO SCHEDULE APPOINTMENT FOR FURTHER REFILLS 30 tablet 11  . chlorthalidone (HYGROTON) 25 MG tablet Take 1 tablet (25 mg total) by mouth daily. 30 tablet 11  . clopidogrel (PLAVIX) 75 MG tablet TAKE 1 TABLET BY MOUTH EVERY DAY 30 tablet 9  . famotidine (PEPCID) 20 MG tablet Take 20 mg by mouth daily as needed for heartburn or indigestion.     Marland Kitchen losartan (COZAAR) 100 MG tablet Take 1 tablet (100 mg total) by mouth daily. 90 tablet 3  . metoprolol  succinate (TOPROL-XL) 50 MG 24 hr tablet TAKE 1 TABLET (50 MG TOTAL) BY MOUTH DAILY. TAKE WITH OR IMMEDIATELY FOLLOWING A MEAL. 90 tablet 2   No current facility-administered medications on file prior to visit.     No Known Allergies  Blood pressure 122/70, pulse 66, SpO2 99 %.   Assessment/Plan: Hypertension: BMET today with medication changes. BP at goal and controlled. No medication changes. Follow up with Dr. Burt Knack as scheduled and HTN clinic as needed.    Thank you, Lelan Pons. Patterson Hammersmith, [facility]  07/06/2017 1:55 PM

## 2017-07-18 ENCOUNTER — Other Ambulatory Visit: Payer: Self-pay | Admitting: Cardiovascular Disease

## 2017-07-23 ENCOUNTER — Encounter: Payer: Self-pay | Admitting: Cardiovascular Disease

## 2017-07-26 ENCOUNTER — Ambulatory Visit: Payer: PPO

## 2017-08-11 ENCOUNTER — Other Ambulatory Visit: Payer: Self-pay | Admitting: Cardiovascular Disease

## 2017-08-14 ENCOUNTER — Encounter: Payer: Self-pay | Admitting: Cardiovascular Disease

## 2017-09-08 ENCOUNTER — Other Ambulatory Visit: Payer: Self-pay | Admitting: Cardiovascular Disease

## 2017-09-08 MED ORDER — ATORVASTATIN CALCIUM 10 MG PO TABS: ORAL_TABLET | ORAL | 6 refills | Status: DC

## 2017-09-26 ENCOUNTER — Other Ambulatory Visit: Payer: Self-pay | Admitting: Cardiovascular Disease

## 2017-09-28 NOTE — Telephone Encounter (Signed)
Reviewed with Megan Supple, pharmD. Per pharmD, instructed patient to STOP spironolactone and START chlorthalidone 25 mg daily. Scheduled patient in the HTN Clinic 2/6. He understands BMET will be drawn that day. He was grateful for call and agrees with treatment plan.

## 2017-10-17 ENCOUNTER — Other Ambulatory Visit: Payer: Self-pay | Admitting: Cardiovascular Disease

## 2017-11-14 ENCOUNTER — Telehealth: Payer: Self-pay | Admitting: Cardiovascular Disease

## 2017-11-14 NOTE — Telephone Encounter (Signed)
New Message:       Pt is wanting to know if he will be needing labs done before his appt. Pt state you can let him know through [facility].

## 2017-11-15 ENCOUNTER — Encounter (INDEPENDENT_AMBULATORY_CARE_PROVIDER_SITE_OTHER): Payer: Self-pay

## 2017-11-15 NOTE — Telephone Encounter (Signed)
See MyChart messages.

## 2018-01-03 ENCOUNTER — Other Ambulatory Visit: Payer: Self-pay | Admitting: Cardiovascular Disease

## 2018-01-25 ENCOUNTER — Ambulatory Visit (INDEPENDENT_AMBULATORY_CARE_PROVIDER_SITE_OTHER): Payer: PPO | Admitting: Family Medicine

## 2018-01-25 ENCOUNTER — Encounter: Payer: Self-pay | Admitting: Gastroenterology

## 2018-01-25 ENCOUNTER — Encounter: Payer: Self-pay | Admitting: Family Medicine

## 2018-01-25 VITALS — BP 124/78 | HR 62 | Temp 97.8°F | Ht 72.0 in | Wt 306.8 lb

## 2018-01-25 DIAGNOSIS — Z6841 Body Mass Index (BMI) 40.0 and over, adult: Secondary | ICD-10-CM

## 2018-01-25 DIAGNOSIS — D692 Other nonthrombocytopenic purpura: Secondary | ICD-10-CM

## 2018-01-25 DIAGNOSIS — Z23 Encounter for immunization: Secondary | ICD-10-CM | POA: Diagnosis not present

## 2018-01-25 DIAGNOSIS — Z0001 Encounter for general adult medical examination with abnormal findings: Secondary | ICD-10-CM | POA: Diagnosis not present

## 2018-01-25 DIAGNOSIS — E785 Hyperlipidemia, unspecified: Secondary | ICD-10-CM

## 2018-01-25 DIAGNOSIS — Z1211 Encounter for screening for malignant neoplasm of colon: Secondary | ICD-10-CM

## 2018-01-25 DIAGNOSIS — I10 Essential (primary) hypertension: Secondary | ICD-10-CM

## 2018-01-25 NOTE — Progress Notes (Signed)
Subjective:  Alfred [name] is a 67 y.o. male who presents today for his annual comprehensive physical exam and to establish care.  HPI:  He has no acute complaints today.   He has several chronic, stable conditions outlined below: 1.  Hypertension.  Several year history.  Managed by cardiology.  Currently on Norvasc 10 mg daily, chlorthalidone 25 mg daily, losartan 100 mg daily, and metoprolol succinate 50 mg daily.  Tolerates all these well without side effects. 2.  Hyperlipidemia.  Several year history.  On Lipitor 10 mg daily tolerating well. 3.  History of AAA status post repair.  Follows with vascular surgery.  Symptoms are stable.  No abdominal pain.  He is on both aspirin and Plavix.  Lifestyle Diet: No specific diets. Exercise: No specific exercises.  Depression screen PHQ 2/9 01/25/2018  Decreased Interest 0  Down, Depressed, Hopeless 0  PHQ - 2 Score 0   Health Maintenance Due  Topic Date Due  . Hepatitis C Screening  31-Aug-1950  . TETANUS/TDAP  03/21/1970  . COLONOSCOPY  07/27/2016  . INFLUENZA VACCINE  12/29/2017    ROS: Per HPI, otherwise a complete review of systems was negative.   PMH:  The following were reviewed and entered/updated in epic: Past Medical History:  Diagnosis Date  . AAA (abdominal aortic aneurysm) ([place]) 08/2011   see Surgical history  . Anginal pain ([place])   . Arthritis    "knees; not bad"  . CAD (coronary artery disease)    DES to RCA and CFX; Dr. Burt [name] cardiologist  . Depression ~ 2009   'treated for ~ 6 months"  . Dyslipidemia   . HTN (hypertension)    unspecified essential  . Hx of adenomatous colonic polyps 2008  . Hyperplasia of prostate without lower urinary tract symptoms (LUTS)   . Left lumbar radiculopathy   . Other and unspecified hyperlipidemia   . Stroke [facility])    Patient Active Problem List   Diagnosis Date Noted  . Morbid obesity ([place]) 01/25/2018  . Senile purpura ([place]) 01/25/2018  . CAD (coronary artery  disease)   . HYPERTENSION, BENIGN 05/02/2009  . History of AAA (abdominal aortic aneurysm) repair 11/05/2008  . COLONIC POLYPS, ADENOMATOUS 03/28/2008  . Hyperlipidemia 03/28/2008  . LUMBAR RADICULOPATHY, LEFT 03/28/2008   Past Surgical History:  Procedure Laterality Date  . ABDOMINAL AORTIC ANEURYSM REPAIR  09/06/2011  . ABDOMINAL AORTIC ANEURYSM REPAIR  01/21/2012   Procedure: ANEURYSM ABDOMINAL AORTIC REPAIR;  Surgeon: Alfred Dutch, MD;  Location: [facility];  Service: Vascular;  Laterality: N/A;  Aortio- bi-iliac repair  using Cryolife descending thoracic aorta 18mm x10.5 cm.  . APPENDECTOMY  1970  . ARTERY REPAIR  09/06/2011   Procedure: ARTERY REPAIR;  Surgeon: Alfred Dutch, MD;  Location: [facility];  Service: Vascular;  Laterality: Bilateral;  . COLONOSCOPY W/ POLYPECTOMY  2008   adenomatous  & hyperplastic polyp;due 2013  . CORONARY ANGIOPLASTY WITH STENT PLACEMENT  2002; 2005   "2 +1; total of 3"  . CYSTOSCOPY  2005   varicose veins in bladder, Dr Alfred [name]  . [place]  . VASECTOMY     Family History  Problem Relation Age of Onset  . Coronary artery disease Father        CABG in 4s  . Diabetes Father   . Colon polyps Father   . Lung cancer Father        non-small cell carcinoma/bone CA  . Hypertension Father   .  Coronary artery disease Maternal Grandfather   . Coronary artery disease Paternal Grandfather   . Other Mother        varicose veins  . Anesthesia problems Neg Hx   . Stroke Neg Hx    Medications- reviewed and updated Current Outpatient Medications  Medication Sig Dispense Refill  . acetaminophen (TYLENOL) 500 MG tablet Take 500 mg by mouth every 6 (six) hours as needed for mild pain. PRN for pain/fever    . amLODipine (NORVASC) 10 MG tablet Take 1 tablet (10 mg total) by mouth daily. 90 tablet 3  . aspirin 81 MG tablet Take 81 mg by mouth at bedtime.     Marland Kitchen atorvastatin (LIPITOR) 10 MG tablet TAKE 1 TABLET BY MOUTH DAILY AT 6 PM.  30 tablet 6  . chlorthalidone (HYGROTON) 25 MG tablet Take 1 tablet (25 mg total) by mouth daily. 30 tablet 11  . clopidogrel (PLAVIX) 75 MG tablet Take 1 tablet (75 mg total) by mouth daily. Please keep upcoming appt in October before anymore refills. Thank you 90 tablet 0  . famotidine (PEPCID) 20 MG tablet Take 20 mg by mouth daily as needed for heartburn or indigestion.     Marland Kitchen losartan (COZAAR) 100 MG tablet Take 1 tablet (100 mg total) by mouth daily. 90 tablet 3  . metoprolol succinate (TOPROL-XL) 50 MG 24 hr tablet TAKE 1 TABLET (50 MG TOTAL) BY MOUTH DAILY. TAKE WITH OR IMMEDIATELY FOLLOWING A MEAL. 90 tablet 1   No current facility-administered medications for this visit.    Allergies-reviewed and updated No Known Allergies  Social History   Socioeconomic History  . Marital status: Married    Spouse name: Not on file  . Number of children: Not on file  . Years of education: Not on file  . Highest education level: Not on file  Occupational History  . Occupation: Editor, commissioning  . Financial resource strain: Not on file  . Food insecurity:    Worry: Not on file    Inability: Not on file  . Transportation needs:    Medical: Not on file    Non-medical: Not on file  Tobacco Use  . Smoking status: Former Smoker    Packs/day: 1.00    Years: 27.00    Pack years: 27.00    Types: Cigarettes    Last attempt to quit: 05/31/1993    Years since quitting: 24.6  . Smokeless tobacco: Current User    Types: Snuff  . Tobacco comment: smoked 1968-1995, up to 1 ppd. Currently using Skoal 1 can / day  Substance and Sexual Activity  . Alcohol use: Yes    Alcohol/week: 21.0 standard drinks    Types: 21 Shots of liquor per week    Comment: 2-3 drinks of Vodka a night, occasional beer  . Drug use: No  . Sexual activity: Yes  Lifestyle  . Physical activity:    Days per week: Not on file    Minutes per session: Not on file  . Stress: Not on file  Relationships  . Social  connections:    Talks on phone: Not on file    Gets together: Not on file    Attends religious service: Not on file    Active member of club or organization: Not on file    Attends meetings of clubs or organizations: Not on file    Relationship status: Not on file  Other Topics Concern  . Not on file  Social History Narrative  .  Not on file    Objective:  Physical Exam: BP 124/78 (BP Location: Left Arm, Patient Position: Sitting, Cuff Size: Large)   Pulse 62   Temp 97.8 F (36.6 C) (Oral)   Ht 6' (1.829 m)   Wt (!) 306 lb 12.8 oz (139.2 kg)   SpO2 95%   BMI 41.61 kg/m   Body mass index is 41.61 kg/m. Wt Readings from Last 3 Encounters:  01/25/18 (!) 306 lb 12.8 oz (139.2 kg)  04/26/17 (!) 305 lb (138.3 kg)  03/07/17 (!) 303 lb 6.4 oz (137.6 kg)   Gen: NAD, resting comfortably HEENT: TMs normal bilaterally. OP clear. No thyromegaly noted.  CV: RRR with no murmurs appreciated Pulm: NWOB, CTAB with no crackles, wheezes, or rhonchi GI: Normal bowel sounds present. Soft, Nontender, Nondistended. MSK: no edema, cyanosis, or clubbing noted Skin: warm, dry.  Scattered ecchymoses on upper extremities Neuro: CN2-12 grossly intact. Strength 5/5 in upper and lower extremities. Reflexes symmetric and intact bilaterally.  Psych: Normal affect and thought content  Assessment/Plan:  Senile purpura ([place]) Reassured patient.  Secondary to Plavix and aspirin use.  Morbid obesity ([place]) BMI 41 today.  He is working on lifestyle modifications.  Follow-up in 1 year.  HYPERTENSION, BENIGN Stable on current meds. Will continue.   Hyperlipidemia Continue lipitor 10mg  daily. Check lipid panel with next blood draw.   Preventative Healthcare: We will give Prevnar 13 today.  Will place referral for colonoscopy.  Up-to-date on other screenings.  Patient Counseling(The following topics were reviewed and/or handout was given):  -Nutrition: Stressed importance of moderation in sodium/caffeine  intake, saturated fat and cholesterol, caloric balance, sufficient intake of fresh fruits, vegetables, and fiber.  -Stressed the importance of regular exercise.   -Substance Abuse: Discussed cessation/primary prevention of tobacco, alcohol, or other drug use; driving or other dangerous activities under the influence; availability of treatment for abuse.   -Injury prevention: Discussed safety belts, safety helmets, smoke detector, smoking near bedding or upholstery.   -Sexuality: Discussed sexually transmitted diseases, partner selection, use of condoms, avoidance of unintended pregnancy and contraceptive alternatives.   -Dental health: Discussed importance of regular tooth brushing, flossing, and dental visits.  -Health maintenance and immunizations reviewed. Please refer to Health maintenance section.  Return to care in 1 year for next preventative visit.   Algis Greenhouse. Jerline Pain, MD 01/25/2018 10:00 AM

## 2018-01-25 NOTE — Assessment & Plan Note (Signed)
Continue lipitor 10mg  daily. Check lipid panel with next blood draw.

## 2018-01-25 NOTE — Assessment & Plan Note (Signed)
BMI 41 today.  He is working on lifestyle modifications.  Follow-up in 1 year.

## 2018-01-25 NOTE — Assessment & Plan Note (Signed)
Reassured patient.  Secondary to Plavix and aspirin use.

## 2018-01-25 NOTE — Patient Instructions (Addendum)
It was very nice to see you today!  Please try to eat more fruits and vegetables.  We will give you your pneumonia shot today.  We will refer you to have your colonoscopy.   Take care, Dr Jerline Pain   Preventive Care 67 Years and Older, Male Preventive care refers to lifestyle choices and visits with your health care provider that can promote health and wellness. What does preventive care include?  A yearly physical exam. This is also called an annual well check.  Dental exams once or twice a year.  Routine eye exams. Ask your health care provider how often you should have your eyes checked.  Personal lifestyle choices, including: ? Daily care of your teeth and gums. ? Regular physical activity. ? Eating a healthy diet. ? Avoiding tobacco and drug use. ? Limiting alcohol use. ? Practicing safe sex. ? Taking low doses of aspirin every day. ? Taking vitamin and mineral supplements as recommended by your health care provider. What happens during an annual well check? The services and screenings done by your health care provider during your annual well check will depend on your age, overall health, lifestyle risk factors, and family history of disease. Counseling Your health care provider may ask you questions about your:  Alcohol use.  Tobacco use.  Drug use.  Emotional well-being.  Home and relationship well-being.  Sexual activity.  Eating habits.  History of falls.  Memory and ability to understand (cognition).  Work and work Statistician.  Screening You may have the following tests or measurements:  Height, weight, and BMI.  Blood pressure.  Lipid and cholesterol levels. These may be checked every 5 years, or more frequently if you are over 73 years old.  Skin check.  Lung cancer screening. You may have this screening every year starting at age 84 if you have a 30-pack-year history of smoking and currently smoke or have quit within the past 15  years.  Fecal occult blood test (FOBT) of the stool. You may have this test every year starting at age 13.  Flexible sigmoidoscopy or colonoscopy. You may have a sigmoidoscopy every 5 years or a colonoscopy every 10 years starting at age 17.  Prostate cancer screening. Recommendations will vary depending on your family history and other risks.  Hepatitis C blood test.  Hepatitis B blood test.  Sexually transmitted disease (STD) testing.  Diabetes screening. This is done by checking your blood sugar (glucose) after you have not eaten for a while (fasting). You may have this done every 1-3 years.  Abdominal aortic aneurysm (AAA) screening. You may need this if you are a current or former smoker.  Osteoporosis. You may be screened starting at age 79 if you are at high risk.  Talk with your health care provider about your test results, treatment options, and if necessary, the need for more tests. Vaccines Your health care provider may recommend certain vaccines, such as:  Influenza vaccine. This is recommended every year.  Tetanus, diphtheria, and acellular pertussis (Tdap, Td) vaccine. You may need a Td booster every 10 years.  Varicella vaccine. You may need this if you have not been vaccinated.  Zoster vaccine. You may need this after age 20.  Measles, mumps, and rubella (MMR) vaccine. You may need at least one dose of MMR if you were born in 1957 or later. You may also need a second dose.  Pneumococcal 13-valent conjugate (PCV13) vaccine. One dose is recommended after age 11.  Pneumococcal polysaccharide (PPSV23)  vaccine. One dose is recommended after age 53.  Meningococcal vaccine. You may need this if you have certain conditions.  Hepatitis A vaccine. You may need this if you have certain conditions or if you travel or work in places where you may be exposed to hepatitis A.  Hepatitis B vaccine. You may need this if you have certain conditions or if you travel or work in  places where you may be exposed to hepatitis B.  Haemophilus influenzae type b (Hib) vaccine. You may need this if you have certain risk factors.  Talk to your health care provider about which screenings and vaccines you need and how often you need them. This information is not intended to replace advice given to you by your health care provider. Make sure you discuss any questions you have with your health care provider. Document Released: 06/13/2015 Document Revised: 02/04/2016 Document Reviewed: 03/18/2015 Elsevier Interactive Patient Education  [employer].

## 2018-01-25 NOTE — Assessment & Plan Note (Signed)
Stable on current meds. Will continue.

## 2018-02-21 ENCOUNTER — Telehealth: Payer: Self-pay | Admitting: *Deleted

## 2018-02-21 ENCOUNTER — Ambulatory Visit (INDEPENDENT_AMBULATORY_CARE_PROVIDER_SITE_OTHER): Payer: PPO | Admitting: Physician Assistant

## 2018-02-21 ENCOUNTER — Encounter: Payer: Self-pay | Admitting: Physician Assistant

## 2018-02-21 VITALS — BP 130/78 | HR 53 | Ht 72.0 in | Wt 310.0 lb

## 2018-02-21 DIAGNOSIS — Z01818 Encounter for other preprocedural examination: Secondary | ICD-10-CM

## 2018-02-21 DIAGNOSIS — Z7901 Long term (current) use of anticoagulants: Secondary | ICD-10-CM | POA: Diagnosis not present

## 2018-02-21 DIAGNOSIS — Z86010 Personal history of colonic polyps: Secondary | ICD-10-CM | POA: Diagnosis not present

## 2018-02-21 MED ORDER — NA SULFATE-K SULFATE-MG SULF 17.5-3.13-1.6 GM/177ML PO SOLN: ORAL | 0 refills | Status: DC

## 2018-02-21 NOTE — Patient Instructions (Signed)
You have been scheduled for a colonoscopy. Please follow written instructions given to you at your visit today.  Please pick up your prep supplies at the pharmacy within the next 1-3 days. If you use inhalers (even only as needed), please bring them with you on the day of your procedure.   You will be contacted by our office prior to your procedure for directions on holding your Plavix.  If you do not hear from our office 1 week prior to your scheduled procedure, please call [phone] to discuss.   You will follow up pending results of colonoscopy  If you are age 67 or older, your body mass index should be between 23-30. Your Body mass index is 42.04 kg/m. If this is out of the aforementioned range listed, please consider follow up with your Primary Care Provider.  If you are age 67 or younger, your body mass index should be between 19-25. Your Body mass index is 42.04 kg/m. If this is out of the aformentioned range listed, please consider follow up with your Primary Care Provider.

## 2018-02-21 NOTE — Telephone Encounter (Signed)
   Primary Maramec, MD  Chart reviewed as part of pre-operative protocol coverage. Because of CHAYTON MURATA past medical history and time since last visit, he/she will require a follow-up visit in order to better assess preoperative cardiovascular risk.  Pre-op covering staff: - Please schedule appointment and call patient to inform them. - Please contact requesting surgeon's office via preferred method (i.e, phone, fax) to inform them of need for appointment prior to surgery.  Daune Perch, NP  02/21/2018, 2:24 PM

## 2018-02-21 NOTE — Progress Notes (Signed)
Reviewed and agree with management plan.  Edessa Jakubowicz T. Shakeela Rabadan, MD FACG 

## 2018-02-21 NOTE — Telephone Encounter (Signed)
Pt is scheduled to see Dr. Burt Knack on 03/08/18. Clearance will be addressed at visit

## 2018-02-21 NOTE — Progress Notes (Signed)
Chief Complaint: History of adenomatous polyps, chronic anticoagulation  HPI:    Alfred [name] is a 67 year old Caucasian male with a past medical history as listed below including CAD and aortic aneurysm repair on Plavix, known to Dr. Fuller Plan for his screening colonoscopy, who was referred to me by Alfred Barrack, MD for a preprocedural visit for a surveillance colonoscopy on chronic anticoagulation.      07/27/2006 colonoscopy with 3 polyps in the ascending to transverse colon and 2 polyps from the sigmoid colon to rectum as well as a 3 mm polyp in the rectum, pathology showed tubular adenomas.  Repeat was recommended in 5 years.    Today, patient tells me that when he was supposed to have his surveillance colonoscopy for history of polyps he was having an aortic aneurysm repair and was in the hospital.  He then was not following with a primary care provider for some time and has finally caught up with one.  He would like to get his surveillance colonoscopy completed at this time.  Patient tells me he has had to hold his Plavix in the past for various procedures.  He has done well.  Denies any GI complaints today.    Denies fever, chills, weight loss, anorexia, nausea, vomiting, heartburn, reflux, change in bowel habits or abdominal pain.  Past Medical History:  Diagnosis Date  . AAA (abdominal aortic aneurysm) ([place]) 08/2011   see Surgical history  . Anginal pain ([place])   . Arthritis    "knees; not bad"  . CAD (coronary artery disease)    DES to RCA and CFX; Dr. Burt Knack cardiologist  . Depression ~ 2009   'treated for ~ 6 months"  . Dyslipidemia   . HTN (hypertension)    unspecified essential  . Hx of adenomatous colonic polyps 2008  . Hyperplasia of prostate without lower urinary tract symptoms (LUTS)   . Left lumbar radiculopathy   . Other and unspecified hyperlipidemia   . Stroke [facility])     Past Surgical History:  Procedure Laterality Date  . ABDOMINAL AORTIC ANEURYSM REPAIR  09/06/2011    . ABDOMINAL AORTIC ANEURYSM REPAIR  01/21/2012   Procedure: ANEURYSM ABDOMINAL AORTIC REPAIR;  Surgeon: Elam Dutch, MD;  Location: [facility];  Service: Vascular;  Laterality: N/A;  Aortio- bi-iliac repair  using Cryolife descending thoracic aorta 63mm x10.5 cm.  . APPENDECTOMY  1970  . ARTERY REPAIR  09/06/2011   Procedure: ARTERY REPAIR;  Surgeon: Elam Dutch, MD;  Location: [facility];  Service: Vascular;  Laterality: Bilateral;  . COLONOSCOPY W/ POLYPECTOMY  2008   adenomatous  & hyperplastic polyp;due 2013  . CORONARY ANGIOPLASTY WITH STENT PLACEMENT  2002; 2005   "2 +1; total of 3"  . CYSTOSCOPY  2005   varicose veins in bladder, Dr Diona Fanti  . [place]  . VASECTOMY      Current Outpatient Medications  Medication Sig Dispense Refill  . acetaminophen (TYLENOL) 500 MG tablet Take 500 mg by mouth every 6 (six) hours as needed for mild pain. PRN for pain/fever    . amLODipine (NORVASC) 10 MG tablet Take 1 tablet (10 mg total) by mouth daily. 90 tablet 3  . aspirin 81 MG tablet Take 81 mg by mouth at bedtime.     Marland Kitchen atorvastatin (LIPITOR) 10 MG tablet TAKE 1 TABLET BY MOUTH DAILY AT 6 PM. 30 tablet 6  . chlorthalidone (HYGROTON) 25 MG tablet Take 1 tablet (25 mg total) by  mouth daily. 30 tablet 11  . clopidogrel (PLAVIX) 75 MG tablet Take 1 tablet (75 mg total) by mouth daily. Please keep upcoming appt in October before anymore refills. Thank you 90 tablet 0  . famotidine (PEPCID) 20 MG tablet Take 20 mg by mouth daily as needed for heartburn or indigestion.     Marland Kitchen losartan (COZAAR) 100 MG tablet Take 1 tablet (100 mg total) by mouth daily. 90 tablet 3  . metoprolol succinate (TOPROL-XL) 50 MG 24 hr tablet TAKE 1 TABLET (50 MG TOTAL) BY MOUTH DAILY. TAKE WITH OR IMMEDIATELY FOLLOWING A MEAL. 90 tablet 1   No current facility-administered medications for this visit.     Allergies as of 02/21/2018  . (No Known Allergies)    Family History  Problem  Relation Age of Onset  . Coronary artery disease Father        CABG in 89s  . Diabetes Father   . Colon polyps Father   . Lung cancer Father        non-small cell carcinoma/bone CA  . Hypertension Father   . Coronary artery disease Maternal Grandfather   . Coronary artery disease Paternal Grandfather   . Other Mother        varicose veins  . Anesthesia problems Neg Hx   . Stroke Neg Hx     Social History   Socioeconomic History  . Marital status: Married    Spouse name: Not on file  . Number of children: Not on file  . Years of education: Not on file  . Highest education level: Not on file  Occupational History  . Occupation: Editor, commissioning  . Financial resource strain: Not on file  . Food insecurity:    Worry: Not on file    Inability: Not on file  . Transportation needs:    Medical: Not on file    Non-medical: Not on file  Tobacco Use  . Smoking status: Former Smoker    Packs/day: 1.00    Years: 27.00    Pack years: 27.00    Types: Cigarettes    Last attempt to quit: 05/31/1993    Years since quitting: 24.7  . Smokeless tobacco: Current User    Types: Snuff  . Tobacco comment: smoked 1968-1995, up to 1 ppd. Currently using Skoal 1 can / day  Substance and Sexual Activity  . Alcohol use: Yes    Alcohol/week: 21.0 standard drinks    Types: 21 Shots of liquor per week    Comment: 2-3 drinks of Vodka a night, occasional beer  . Drug use: No  . Sexual activity: Yes  Lifestyle  . Physical activity:    Days per week: Not on file    Minutes per session: Not on file  . Stress: Not on file  Relationships  . Social connections:    Talks on phone: Not on file    Gets together: Not on file    Attends religious service: Not on file    Active member of club or organization: Not on file    Attends meetings of clubs or organizations: Not on file    Relationship status: Not on file  . Intimate partner violence:    Fear of current or ex partner: Not on file     Emotionally abused: Not on file    Physically abused: Not on file    Forced sexual activity: Not on file  Other Topics Concern  . Not on file  Social History  Narrative  . Not on file    Review of Systems:    Constitutional: No weight loss, fever or chills Skin: No rash  Cardiovascular: No chest pain Respiratory: No SOB  Gastrointestinal: See HPI and otherwise negative Genitourinary: No dysuria  Neurological: No headache Musculoskeletal: No new muscle or joint pain Hematologic: No bleeding  Psychiatric: No history of depression or anxiety   Physical Exam:  Vital signs: BP 130/78   Pulse (!) 53   Ht 6' (1.829 m)   Wt (!) 310 lb (140.6 kg)   BMI 42.04 kg/m   Constitutional:   Pleasant overweight Caucasian male appears to be in NAD, Well developed, Well nourished, alert and cooperative Respiratory: Respirations even and unlabored. Lungs clear to auscultation bilaterally.   No wheezes, crackles, or rhonchi.  Cardiovascular: Normal S1, S2. No MRG. Regular rate and rhythm. No peripheral edema, cyanosis or pallor.  Gastrointestinal:  Soft, nondistended, nontender. No rebound or guarding. Normal bowel sounds. No appreciable masses or hepatomegaly. Rectal:  Not performed.  Psychiatric: Demonstrates good judgement and reason without abnormal affect or behaviors.  No recent labs or imaging.  Assessment: 1.  History of adenomatous colon polyps: Last colonoscopy in 2008, recommended repeat in 5 years 2.  Chronic anticoagulation: With Plavix for CAD and status post aortic aneurysm repair  Plan: 1.  Advised the patient hold his Plavix for 5 days prior to time procedure.  We will consult with his cardiologist to ensure that holding his Plavix is acceptable for him. 2.  Scheduled patient for a surveillance colonoscopy with Dr. Fuller Plan in [facility].  Did discuss risk, benefits, limitations and alternatives and the patient agrees to proceed. 3.  Patient to follow in clinic per recommendations from  Dr. Fuller Plan after time of procedure.  Alfred Newer, PA-C [facility] 02/21/2018, 8:53 AM  Cc: Alfred Barrack, MD

## 2018-02-21 NOTE — Telephone Encounter (Signed)
[facility] Pre-operative Risk Assessment     Request for surgical clearance:     Endoscopy Procedure  What type of surgery is being performed?     Colonoscopy  When is this surgery scheduled?     04/05/2018  What type of clearance is required ?   Pharmacy  Are there any medications that need to be held prior to surgery and how long? 5 days  Practice name and name of physician performing surgery?      [facility]  What is your office phone and fax number?      Phone- [phone]  [phone]  Anesthesia type (None, local, MAC, general) ?       MAC

## 2018-03-06 ENCOUNTER — Other Ambulatory Visit: Payer: Self-pay | Admitting: Cardiovascular Disease

## 2018-03-08 ENCOUNTER — Ambulatory Visit: Payer: PPO | Admitting: Cardiovascular Disease

## 2018-03-08 ENCOUNTER — Encounter: Payer: Self-pay | Admitting: Cardiovascular Disease

## 2018-03-08 VITALS — BP 120/78 | HR 64 | Ht 72.0 in | Wt 311.0 lb

## 2018-03-08 DIAGNOSIS — I2510 Atherosclerotic heart disease of native coronary artery without angina pectoris: Secondary | ICD-10-CM

## 2018-03-08 DIAGNOSIS — I10 Essential (primary) hypertension: Secondary | ICD-10-CM | POA: Diagnosis not present

## 2018-03-08 DIAGNOSIS — E782 Mixed hyperlipidemia: Secondary | ICD-10-CM | POA: Diagnosis not present

## 2018-03-08 LAB — COMPREHENSIVE METABOLIC PANEL
A/G RATIO: 1.7 (ref 1.2–2.2)
ALBUMIN: 4 g/dL (ref 3.6–4.8)
ALT: 73 IU/L — AB (ref 0–44)
AST: 55 IU/L — ABNORMAL HIGH (ref 0–40)
Alkaline Phosphatase: 75 IU/L (ref 39–117)
BUN / CREAT RATIO: 16 (ref 10–24)
BUN: 18 mg/dL (ref 8–27)
Bilirubin Total: 0.6 mg/dL (ref 0.0–1.2)
CO2: 26 mmol/L (ref 20–29)
Calcium: 9.3 mg/dL (ref 8.6–10.2)
Chloride: 97 mmol/L (ref 96–106)
Creatinine, Ser: 1.12 mg/dL (ref 0.76–1.27)
GFR calc non Af Amer: 68 mL/min/{1.73_m2} (ref >59)
GFR, EST AFRICAN AMERICAN: 79 mL/min/{1.73_m2} (ref >59)
GLUCOSE: 112 mg/dL — AB (ref 65–99)
Globulin, Total: 2.4 g/dL (ref 1.5–4.5)
Potassium: 4 mmol/L (ref 3.5–5.2)
Sodium: 140 mmol/L (ref 134–144)
TOTAL PROTEIN: 6.4 g/dL (ref 6.0–8.5)

## 2018-03-08 LAB — LIPID PANEL
CHOL/HDL RATIO: 2.7 ratio (ref 0.0–5.0)
Cholesterol, Total: 127 mg/dL (ref 100–199)
HDL: 47 mg/dL (ref >39)
LDL Calculated: 61 mg/dL (ref 0–99)
Triglycerides: 95 mg/dL (ref 0–149)
VLDL Cholesterol Cal: 19 mg/dL (ref 5–40)

## 2018-03-08 NOTE — Patient Instructions (Signed)
Medication Instructions:  Your provider recommends that you continue on your current medications as directed. Please refer to the Current Medication list given to you today.    Labwork: TODAY: CMET, lipids   Testing/Procedures: None  Follow-Up: Your provider wants you to follow-up in: 1 year with Dr. Burt Knack. You will receive a reminder letter in the mail two months in advance. If you don't receive a letter, please call our office to schedule the follow-up appointment.    Any Other Special Instructions Will Be Listed Below (If Applicable).     If you need a refill on your cardiac medications before your next appointment, please call your pharmacy.

## 2018-03-08 NOTE — Progress Notes (Signed)
Cardiology Office Note:    Date:  03/08/2018   ID:  Alfred [name], DOB May 25, 1951, MRN [id number]  PCP:  Vivi Barrack, MD  Cardiologist:  Sherren Mocha, MD  Electrophysiologist:  None   Referring MD: No ref. provider found   Chief Complaint  Patient presents with  . Hypertension    History of Present Illness:    Alfred [name] is a 67 y.o. male with a hx of coronary artery disease, hypertension, hyperlipidemia, and abdominal aortic aneurysm.  The patient is undergone stenting of the right coronary artery and left circumflex, both vessels treated with drug-eluting stents.  He was last seen 1 year ago and at the time of his last visit his blood pressure was uncontrolled.  Amlodipine was added to his medical regimen and he was referred to the hypertension clinic.  Chlorthalidone has also been added and he has achieved good blood pressure control based on review of hypertension clinic visits.  He presents today for follow-up evaluation.  The patient feels well.  He denies chest pain or shortness of breath.  No leg swelling, orthopnea, or PND.  He is compliant with his medications.  Past Medical History:  Diagnosis Date  . AAA (abdominal aortic aneurysm) ([place]) 08/2011   see Surgical history  . Anginal pain ([place])   . Arthritis    "knees; not bad"  . CAD (coronary artery disease)    DES to RCA and CFX; Dr. Burt Knack cardiologist  . Depression ~ 2009   'treated for ~ 6 months"  . Dyslipidemia   . HTN (hypertension)    unspecified essential  . Hx of adenomatous colonic polyps 2008  . Hyperplasia of prostate without lower urinary tract symptoms (LUTS)   . Left lumbar radiculopathy   . Other and unspecified hyperlipidemia   . Stroke [facility])     Past Surgical History:  Procedure Laterality Date  . ABDOMINAL AORTIC ANEURYSM REPAIR  09/06/2011  . ABDOMINAL AORTIC ANEURYSM REPAIR  01/21/2012   Procedure: ANEURYSM ABDOMINAL AORTIC REPAIR;  Surgeon: Elam Dutch, MD;  Location: [facility];   Service: Vascular;  Laterality: N/A;  Aortio- bi-iliac repair  using Cryolife descending thoracic aorta 38mm x10.5 cm.  . APPENDECTOMY  1970  . ARTERY REPAIR  09/06/2011   Procedure: ARTERY REPAIR;  Surgeon: Elam Dutch, MD;  Location: [facility];  Service: Vascular;  Laterality: Bilateral;  . COLONOSCOPY W/ POLYPECTOMY  2008   adenomatous  & hyperplastic polyp;due 2013  . CORONARY ANGIOPLASTY WITH STENT PLACEMENT  2002; 2005   "2 +1; total of 3"  . CYSTOSCOPY  2005   varicose veins in bladder, Dr Diona Fanti  . [place]  . VASECTOMY      Current Medications: Current Meds  Medication Sig  . acetaminophen (TYLENOL) 500 MG tablet Take 500 mg by mouth every 6 (six) hours as needed for mild pain. PRN for pain/fever  . amLODipine (NORVASC) 10 MG tablet Take 1 tablet (10 mg total) by mouth daily.  Marland Kitchen aspirin 81 MG tablet Take 81 mg by mouth at bedtime.   Marland Kitchen atorvastatin (LIPITOR) 10 MG tablet TAKE 1 TABLET BY MOUTH DAILY AT 6 PM.  . chlorthalidone (HYGROTON) 25 MG tablet Take 1 tablet (25 mg total) by mouth daily.  . clopidogrel (PLAVIX) 75 MG tablet Take 1 tablet (75 mg total) by mouth daily. Please keep upcoming appt in October before anymore refills. Thank you  . famotidine (PEPCID) 20 MG tablet Take 20 mg  by mouth daily as needed for heartburn or indigestion.   Marland Kitchen losartan (COZAAR) 100 MG tablet Take 1 tablet (100 mg total) by mouth daily. Please keep upcoming appt in October with Dr. Burt Knack for future refills. Thank you  . metoprolol succinate (TOPROL-XL) 50 MG 24 hr tablet TAKE 1 TABLET (50 MG TOTAL) BY MOUTH DAILY. TAKE WITH OR IMMEDIATELY FOLLOWING A MEAL.     Allergies:   Patient has no known allergies.   Social History   Socioeconomic History  . Marital status: Married    Spouse name: Not on file  . Number of children: Not on file  . Years of education: Not on file  . Highest education level: Not on file  Occupational History  . Occupation: Network engineer  . Financial resource strain: Not on file  . Food insecurity:    Worry: Not on file    Inability: Not on file  . Transportation needs:    Medical: Not on file    Non-medical: Not on file  Tobacco Use  . Smoking status: Former Smoker    Packs/day: 1.00    Years: 27.00    Pack years: 27.00    Types: Cigarettes    Last attempt to quit: 05/31/1993    Years since quitting: 24.7  . Smokeless tobacco: Current User    Types: Snuff  . Tobacco comment: smoked 1968-1995, up to 1 ppd. Currently using Skoal 1 can / day  Substance and Sexual Activity  . Alcohol use: Yes    Alcohol/week: 21.0 standard drinks    Types: 21 Shots of liquor per week    Comment: 2-3 drinks of Vodka a night, occasional beer  . Drug use: No  . Sexual activity: Yes  Lifestyle  . Physical activity:    Days per week: Not on file    Minutes per session: Not on file  . Stress: Not on file  Relationships  . Social connections:    Talks on phone: Not on file    Gets together: Not on file    Attends religious service: Not on file    Active member of club or organization: Not on file    Attends meetings of clubs or organizations: Not on file    Relationship status: Not on file  Other Topics Concern  . Not on file  Social History Narrative  . Not on file     Family History: The patient's family history includes Colon polyps in his father; Coronary artery disease in his father, maternal grandfather, and paternal grandfather; Diabetes in his father; Hypertension in his father; Lung cancer in his father; Other in his mother. There is no history of Anesthesia problems or Stroke.  ROS:   Please see the history of present illness.    All other systems reviewed and are negative.  EKGs/Labs/Other Studies Reviewed:    The following studies were reviewed today:  EKG:  EKG is ordered today.  The ekg ordered today demonstrates NSR 64 bpm, within normal limits  Recent Labs: 03/08/2018: ALT 73; BUN 18;  Creatinine, Ser 1.12; Potassium 4.0; Sodium 140  Recent Lipid Panel    Component Value Date/Time   CHOL 127 03/08/2018 0927   TRIG 95 03/08/2018 0927   TRIG 110 05/26/2006 1118   HDL 47 03/08/2018 0927   CHOLHDL 2.7 03/08/2018 0927   CHOLHDL 3.0 04/14/2015 0952   VLDL 16 04/14/2015 0952   LDLCALC 61 03/08/2018 0927   LDLDIRECT 78.1 01/15/2013 0747  Physical Exam:    VS:  BP 120/78   Pulse 64   Ht 6' (1.829 m)   Wt (!) 311 lb (141.1 kg)   BMI 42.18 kg/m     Wt Readings from Last 3 Encounters:  03/08/18 (!) 311 lb (141.1 kg)  02/21/18 (!) 310 lb (140.6 kg)  01/25/18 (!) 306 lb 12.8 oz (139.2 kg)     GEN:  Well nourished, well developed, overweight male in no acute distress HEENT: Normal NECK: No JVD; No carotid bruits LYMPHATICS: No lymphadenopathy CARDIAC: RRR, no murmurs, rubs, gallops RESPIRATORY:  Clear to auscultation without rales, wheezing or rhonchi  ABDOMEN: Soft, non-tender, non-distended, midline incisional hernia noted. MUSCULOSKELETAL:  No edema; No deformity  SKIN: Warm and dry NEUROLOGIC:  Alert and oriented x 3 PSYCHIATRIC:  Normal affect   ASSESSMENT:    1. Coronary artery disease involving native coronary artery of native heart without angina pectoris   2. Essential hypertension   3. Mixed hyperlipidemia    PLAN:    In order of problems listed above:  1. The patient will continue on long-term dual antiplatelet therapy with aspirin and clopidogrel.  It would be acceptable for him to hold clopidogrel for procedures as he mentions he may need a colonoscopy in the near future.  He is treated with long-term dual antiplatelet therapy because of first generation drug-eluting stents. 2. Blood pressure is well controlled on losartan, amlodipine, and chlorthalidone 3. Treated with atorvastatin 10 mg daily.  Last lipids reviewed and we will update labs today.  Last LDL cholesterol from 2018 was 66 mg/dL.   Medication Adjustments/Labs and Tests  Ordered: Current medicines are reviewed at length with the patient today.  Concerns regarding medicines are outlined above.  Orders Placed This Encounter  Procedures  . Comprehensive metabolic panel  . Lipid panel  . EKG 12-Lead   No orders of the defined types were placed in this encounter.   Patient Instructions  Medication Instructions:  Your provider recommends that you continue on your current medications as directed. Please refer to the Current Medication list given to you today.    Labwork: TODAY: CMET, lipids   Testing/Procedures: None  Follow-Up: Your provider wants you to follow-up in: 1 year with Dr. Burt Knack. You will receive a reminder letter in the mail two months in advance. If you don't receive a letter, please call our office to schedule the follow-up appointment.    Any Other Special Instructions Will Be Listed Below (If Applicable).     If you need a refill on your cardiac medications before your next appointment, please call your pharmacy.      Signed, Sherren Mocha, MD  03/08/2018 6:05 PM    [facility]

## 2018-03-09 NOTE — Telephone Encounter (Signed)
Ok to hold plavix x 5 days if needed. thanks

## 2018-03-09 NOTE — Telephone Encounter (Signed)
Please advise how many days before procedure patient is allowed to  Hold Blood thinner  Thank you

## 2018-03-10 NOTE — Telephone Encounter (Signed)
See attached

## 2018-03-10 NOTE — Telephone Encounter (Signed)
   Primary Cardiologist: Sherren Mocha, MD  Chart reviewed as part of pre-operative protocol coverage. Given past medical history and time since last visit, based on ACC/AHA guidelines, Alfred [name] would be at acceptable risk for the planned procedure without further cardiovascular testing.  Ok to hold Plavix for 5 days if needed.    I will route this recommendation to the requesting party via Epic fax function and remove from pre-op pool.  Please call with questions.  Cecilie Kicks, NP 03/10/2018, 12:02 PM

## 2018-03-23 ENCOUNTER — Other Ambulatory Visit: Payer: Self-pay | Admitting: Cardiovascular Disease

## 2018-03-27 ENCOUNTER — Encounter: Payer: Self-pay | Admitting: Gastroenterology

## 2018-03-29 ENCOUNTER — Other Ambulatory Visit: Payer: Self-pay | Admitting: Cardiovascular Disease

## 2018-03-30 ENCOUNTER — Other Ambulatory Visit: Payer: Self-pay | Admitting: Cardiovascular Disease

## 2018-04-05 ENCOUNTER — Other Ambulatory Visit: Payer: Self-pay | Admitting: Cardiovascular Disease

## 2018-04-05 ENCOUNTER — Encounter: Payer: Self-pay | Admitting: Gastroenterology

## 2018-04-05 ENCOUNTER — Ambulatory Visit (AMBULATORY_SURGERY_CENTER): Payer: PPO | Admitting: Gastroenterology

## 2018-04-05 VITALS — BP 129/73 | HR 61 | Temp 96.6°F | Resp 17 | Ht 72.0 in | Wt 310.0 lb

## 2018-04-05 DIAGNOSIS — D123 Benign neoplasm of transverse colon: Secondary | ICD-10-CM

## 2018-04-05 DIAGNOSIS — D122 Benign neoplasm of ascending colon: Secondary | ICD-10-CM

## 2018-04-05 DIAGNOSIS — I2510 Atherosclerotic heart disease of native coronary artery without angina pectoris: Secondary | ICD-10-CM | POA: Diagnosis not present

## 2018-04-05 DIAGNOSIS — D120 Benign neoplasm of cecum: Secondary | ICD-10-CM | POA: Diagnosis not present

## 2018-04-05 DIAGNOSIS — I10 Essential (primary) hypertension: Secondary | ICD-10-CM | POA: Diagnosis not present

## 2018-04-05 DIAGNOSIS — Z86010 Personal history of colonic polyps: Secondary | ICD-10-CM

## 2018-04-05 MED ORDER — SODIUM CHLORIDE 0.9 % IV SOLN: 500.0000 mL | Freq: Once | INTRAVENOUS | Status: DC

## 2018-04-05 NOTE — Progress Notes (Signed)
To recovery, report to RN, VSS. 

## 2018-04-05 NOTE — Op Note (Signed)
[name]: Alfred [name] Procedure Date: 04/05/2018 8:27 AM MRN: [id number] Endoscopist: Ladene Artist , MD Age: 67 Referring MD:  Date of Birth: 1950-08-08 Gender: Male Account #: [id number] Procedure:                Colonoscopy Indications:              Surveillance: Personal history of adenomatous                            polyps on last colonoscopy > 5 years ago Medicines:                Monitored Anesthesia Care Procedure:                Pre-Anesthesia Assessment:                           - Prior to the procedure, a History and Physical                            was performed, and patient medications and                            allergies were reviewed. The patient's tolerance of                            previous anesthesia was also reviewed. The risks                            and benefits of the procedure and the sedation                            options and risks were discussed with the patient.                            All questions were answered, and informed consent                            was obtained. Prior Anticoagulants: The patient has                            taken Plavix (clopidogrel), last dose was 5 days                            prior to procedure. ASA Grade Assessment: III - A                            patient with severe systemic disease. After                            reviewing the risks and benefits, the patient was                            deemed in satisfactory condition to undergo the  procedure.                           After obtaining informed consent, the colonoscope                            was passed under direct vision. Throughout the                            procedure, the patient's blood pressure, pulse, and                            oxygen saturations were monitored continuously. The                            Colonoscope was introduced through the anus and                    advanced to the the cecum, identified by                            appendiceal orifice and ileocecal valve. The                            ileocecal valve, appendiceal orifice, and rectum                            were photographed. The quality of the bowel                            preparation was adequate. The colonoscopy was                            performed without difficulty. The patient tolerated                            the procedure well. Scope In: 8:44:13 AM Scope Out: 9:04:48 AM Scope Withdrawal Time: 0 hours 18 minutes 15 seconds  Total Procedure Duration: 0 hours 20 minutes 35 seconds  Findings:                 The perianal and digital rectal examinations were                            normal.                           Three sessile polyps were found in the transverse                            colon (2) and cecum (1). The polyps were 7 to 8 mm                            in size. These polyps were removed with a cold  snare. Resection and retrieval were complete.                           Eight sessile polyps were found in the transverse                            colon (2), ascending colon (4) and cecum (2). The                            polyps were 3 to 5 mm in size. These polyps were                            removed with a cold biopsy forceps. Resection and                            retrieval were complete.                           A few small-mouthed diverticula were found in the                            left colon.                           The exam was otherwise without abnormality on                            direct and retroflexion views. Complications:            No immediate complications. Estimated blood loss:                            None. Estimated Blood Loss:     Estimated blood loss: none. Impression:               - Three 7 to 8 mm polyps in the transverse colon                            and in  the cecum, removed with a cold snare.                            Resected and retrieved.                           - Eight 3 to 5 mm polyps in the transverse colon,                            in the ascending colon and in the cecum, removed                            with a cold biopsy forceps. Resected and retrieved.                           - Diverticulosis in the left colon.                           -  The examination was otherwise normal on direct                            and retroflexion views. Recommendation:           - Repeat colonoscopy date to be determined after                            pending pathology results are reviewed for                            surveillance.                           - Resume Plavix (clopidogrel) tomorrow at prior                            dose. Refer to managing physician for further                            adjustment of therapy.                           - Patient has a contact number available for                            emergencies. The signs and symptoms of potential                            delayed complications were discussed with the                            patient. Return to normal activities tomorrow.                            Written discharge instructions were provided to the                            patient.                           - Resume previous diet.                           - Continue present medications.                           - Await pathology results. Ladene Artist, MD 04/05/2018 9:10:12 AM This report has been signed electronically.

## 2018-04-05 NOTE — Telephone Encounter (Signed)
Pt's medication was sent to pt's pharmacy as requested. Confirmation received.  °

## 2018-04-05 NOTE — Progress Notes (Signed)
Called to room to assist during endoscopic procedure.  Patient ID and intended procedure confirmed with present staff. Received instructions for my participation in the procedure from the performing physician.  

## 2018-04-05 NOTE — Progress Notes (Signed)
Pt's states no medical or surgical changes since previsit or office visit. 

## 2018-04-05 NOTE — Patient Instructions (Signed)
YOU HAD AN ENDOSCOPIC PROCEDURE TODAY AT [facility]:   Refer to the procedure report that was given to you for any specific questions about what was found during the examination.  If the procedure report does not answer your questions, please call your gastroenterologist to clarify.  If you requested that your care partner not be given the details of your procedure findings, then the procedure report has been included in a sealed envelope for you to review at your convenience later.  YOU SHOULD EXPECT: Some feelings of bloating in the abdomen. Passage of more gas than usual.  Walking can help get rid of the air that was put into your GI tract during the procedure and reduce the bloating. If you had a lower endoscopy (such as a colonoscopy or flexible sigmoidoscopy) you may notice spotting of blood in your stool or on the toilet paper. If you underwent a bowel prep for your procedure, you may not have a normal bowel movement for a few days.  Please Note:  You might notice some irritation and congestion in your nose or some drainage.  This is from the oxygen used during your procedure.  There is no need for concern and it should clear up in a day or so.  SYMPTOMS TO REPORT IMMEDIATELY:   Following lower endoscopy (colonoscopy or flexible sigmoidoscopy):  Excessive amounts of blood in the stool  Significant tenderness or worsening of abdominal pains  Swelling of the abdomen that is new, acute  Fever of 100F or higher  Please see handouts given to you on polyps and Diverticulosis. You may resume Plavix in the morning.   For urgent or emergent issues, a gastroenterologist can be reached at any hour by calling [phone].   DIET:  We do recommend a small meal at first, but then you may proceed to your regular diet.  Drink plenty of fluids but you should avoid alcoholic beverages for 24 hours.  ACTIVITY:  You should plan to take it easy for the rest of today and you should NOT  DRIVE or use heavy machinery until tomorrow (because of the sedation medicines used during the test).    FOLLOW UP: Our staff will call the number listed on your records the next business day following your procedure to check on you and address any questions or concerns that you may have regarding the information given to you following your procedure. If we do not reach you, we will leave a message.  However, if you are feeling well and you are not experiencing any problems, there is no need to return our call.  We will assume that you have returned to your regular daily activities without incident.  If any biopsies were taken you will be contacted by phone or by letter within the next 1-3 weeks.  Please call us at [phone] if you have not heard about the biopsies in 3 weeks.    SIGNATURES/CONFIDENTIALITY: You and/or your care partner have signed paperwork which will be entered into your electronic medical record.  These signatures attest to the fact that that the information above on your After Visit Summary has been reviewed and is understood.  Full responsibility of the confidentiality of this discharge information lies with you and/or your care-partner.  Thank you for letting us take care of your healthcare needs today.

## 2018-04-06 ENCOUNTER — Telehealth: Payer: Self-pay

## 2018-04-06 NOTE — Telephone Encounter (Signed)
  Follow up Call-  Call back number 04/05/2018  Post procedure Call Back phone  # [phone]  Permission to leave phone message Yes  Some recent data might be hidden     Patient questions:  Do you have a fever, pain , or abdominal swelling? No. Pain Score  0 *  Have you tolerated food without any problems? Yes.    Have you been able to return to your normal activities? Yes.    Do you have any questions about your discharge instructions: Diet   No. Medications  No. Follow up visit  No.  Do you have questions or concerns about your Care? No.  Actions: * If pain score is 4 or above: No action needed, pain <4.

## 2018-04-24 ENCOUNTER — Other Ambulatory Visit: Payer: Self-pay | Admitting: Cardiovascular Disease

## 2018-04-25 ENCOUNTER — Encounter: Payer: Self-pay | Admitting: Gastroenterology

## 2018-06-06 ENCOUNTER — Other Ambulatory Visit: Payer: Self-pay | Admitting: Cardiovascular Disease

## 2018-09-06 ENCOUNTER — Telehealth: Payer: Self-pay | Admitting: *Deleted

## 2018-09-06 ENCOUNTER — Other Ambulatory Visit: Payer: Self-pay | Admitting: Cardiovascular Disease

## 2018-09-06 NOTE — Telephone Encounter (Signed)
Error

## 2018-09-08 ENCOUNTER — Telehealth: Payer: Self-pay | Admitting: *Deleted

## 2018-09-08 NOTE — Telephone Encounter (Signed)
[facility], [place], [place] [phone] is requesting medication be switched to Valsartan (Diovan) 80 mg due to Losartan 100 mg is on back order. Please advise. Thank you.

## 2018-09-11 NOTE — Telephone Encounter (Signed)
This is fine thanks 

## 2018-09-12 ENCOUNTER — Encounter: Payer: Self-pay | Admitting: Cardiovascular Disease

## 2018-09-12 ENCOUNTER — Telehealth: Payer: Self-pay | Admitting: *Deleted

## 2018-09-12 MED ORDER — VALSARTAN 80 MG PO TABS: 80.0000 mg | ORAL_TABLET | Freq: Every day | ORAL | 3 refills | Status: DC

## 2018-09-12 NOTE — Telephone Encounter (Signed)
See 4/10 phone encounter.

## 2018-09-12 NOTE — Telephone Encounter (Signed)
This encounter was created in error - please disregard.

## 2018-09-12 NOTE — Telephone Encounter (Signed)
Confirmed with pharmacy they have no losartan at all. Will call in valsartan 80 mg.  Left message for patient to call back to review medication changes.

## 2018-09-12 NOTE — Telephone Encounter (Signed)
Follow Up:      Returning your call from today. 

## 2018-09-12 NOTE — Telephone Encounter (Signed)
The patient is out of losartan.  Instructed him to START VALSARTAN 80 mg daily. He was grateful for call and agrees with treatment plan.  Med list updated.

## 2018-09-12 NOTE — Telephone Encounter (Signed)
rx for Valsartan (Diovan) 80 mg, will patient be taking one tablet daily? thanks

## 2018-09-12 NOTE — Addendum Note (Signed)
Addended by: Harland German A on: 09/12/2018 01:12 PM   Modules accepted: Orders

## 2019-01-05 DIAGNOSIS — M25562 Pain in left knee: Secondary | ICD-10-CM | POA: Diagnosis not present

## 2019-01-05 NOTE — Progress Notes (Signed)
Cardiology Office Note:    Date:  01/09/2019   ID:  Alfred [name], DOB 1951-05-03, MRN [id number]  PCP:  Vivi Barrack, MD  Cardiologist:  Sherren Mocha, MD  Electrophysiologist:  None   Referring MD: Vivi Barrack, MD   Chief Complaint  Patient presents with  . Follow-up    CAD, HTN, DOT renewal    History of Present Illness:    Alfred [name] is a 68 y.o. male with:  Coronary artery disease   Hx of Cypher DES to RCA, PDA and LCx in 09/2003  Hypertension   Hyperlipidemia   Abdominal aortic aneurysm   S/p Repair in 2013  BPH  Hx of CVA  Hepatic steatosis (hx of elevated LFTs)   Alfred [name] was last seen by Dr. Burt Knack in October 2019.  He returns for follow-up.  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  He usually needs a stress test, but is not sure if that has been waived this year due to COVID-19.  He does a lot of heavy work with his job.  He has not had any chest discomfort, significant shortness of breath, syncope, orthopnea or significant lower extremity swelling.  He did stop taking chlorthalidone secondary to muscle cramps.  Prior CV studies:   The following studies were reviewed today:  ETT 03/30/17  Blood pressure demonstrated a hypertensive response to exercise.  No T wave inversion was noted during stress.  There was no ST segment deviation noted during stress.  The patient experienced no angina during the stress test.  Overall, the patient's exercise capacity was normal.  [facility] Treadmill Score: low risk Negative stress test without evidence of ischemia at given workload.  Myoview 04/16/15  The left ventricular ejection fraction is normal (55-65%).  Nuclear stress EF: 56%.  There was no ST segment deviation noted during stress.  The study is normal. No evidence of ischemia  This is a low risk study.  Cardiac catheterization 10/27/2004  LM normal LAD prox 30-40, mid 30 LCx mid stent patent; AV groove (prior to stent) 30 RCA prox stent  patent with 20-40 ISR, mid 20-30; PDA stent patent EF 65  Past Medical History:  Diagnosis Date  . AAA (abdominal aortic aneurysm) ([place]) 08/2011   see Surgical history  . Anginal pain ([place])   . Arthritis    "knees; not bad"  . CAD (coronary artery disease)    DES to RCA and CFX; Dr. Burt Knack cardiologist  . Depression ~ 2009   'treated for ~ 6 months"  . Dyslipidemia   . HTN (hypertension)    unspecified essential  . Hx of adenomatous colonic polyps 2008  . Hyperplasia of prostate without lower urinary tract symptoms (LUTS)   . Left lumbar radiculopathy   . Other and unspecified hyperlipidemia   . Stroke [facility])    Surgical Hx: The patient  has a past surgical history that includes Colonoscopy w/ polypectomy (2008); Cystoscopy (2005); Artery repair (09/06/2011); Appendectomy (1970); Coronary angioplasty with stent (2002; 2005); Tonsillectomy and adenoidectomy (1954); Vasectomy; Abdominal aortic aneurysm repair (09/06/2011); and Abdominal aortic aneurysm repair (01/21/2012).   Current Medications: Current Meds  Medication Sig  . acetaminophen (TYLENOL) 500 MG tablet Take 500 mg by mouth every 6 (six) hours as needed for mild pain. PRN for pain/fever  . amLODipine (NORVASC) 10 MG tablet Take 1 tablet (10 mg total) by mouth daily.  Marland Kitchen aspirin 81 MG tablet Take 81 mg by mouth at bedtime.   Marland Kitchen atorvastatin (LIPITOR)  10 MG tablet TAKE 1 TABLET BY MOUTH DAILY AT 6 PM.  . clopidogrel (PLAVIX) 75 MG tablet Take 1 tablet (75 mg total) by mouth daily.  . famotidine (PEPCID) 20 MG tablet Take 20 mg by mouth daily as needed for heartburn or indigestion.   . famotidine (PEPCID) 40 MG tablet Take 1 tablet by mouth daily.  . meloxicam (MOBIC) 15 MG tablet Take 1 tablet by mouth daily.  . metoprolol succinate (TOPROL-XL) 50 MG 24 hr tablet TAKE 1 TABLET (50 MG TOTAL) BY MOUTH DAILY. TAKE WITH OR IMMEDIATELY FOLLOWING A MEAL.  . [DISCONTINUED] valsartan (DIOVAN) 80 MG tablet Take 1 tablet (80 mg total) by  mouth daily.   Current Facility-Administered Medications for the 01/09/19 encounter (Office Visit) with Richardson Dopp T, PA-C  Medication  . 0.9 %  sodium chloride infusion     Allergies:   Patient has no known allergies.   Social History   Tobacco Use  . Smoking status: Former Smoker    Packs/day: 1.00    Years: 27.00    Pack years: 27.00    Types: Cigarettes    Quit date: 05/31/1993    Years since quitting: 25.6  . Smokeless tobacco: Current User    Types: Snuff  . Tobacco comment: smoked 1968-1995, up to 1 ppd. Currently using Sko[place] 1 can / day  Substance Use Topics  . Alcohol use: Yes    Alcohol/week: 21.0 standard drinks    Types: 21 Shots of liquor per week    Comment: 2-3 drinks of Vodka a night, occasional beer  . Drug use: No     Family Hx: The patient's family history includes Colon polyps in his father; Coronary artery disease in his father, maternal grandfather, and paternal grandfather; Diabetes in his father; Hypertension in his father; Lung cancer in his father; Other in his mother. There is no history of Anesthesia problems or Stroke.  ROS:   Please see the history of present illness.    ROS All other systems reviewed and are negative.   EKGs/Labs/Other Test Reviewed:    EKG:  EKG is  ordered today.  The ekg ordered today demonstrates normal sinus rhythm, heart rate 64, left axis deviation, QTC 433, no change from prior tracing  Recent Labs: 01/09/2019: ALT 86; BUN 17; Creatinine, Ser 1.02; Potassium 4.8; Sodium 137   Recent Lipid Panel Lab Results  Component Value Date/Time   CHOL 134 01/09/2019 09:56 AM   TRIG 88 01/09/2019 09:56 AM   TRIG 110 05/26/2006 11:18 AM   HDL 50 01/09/2019 09:56 AM   CHOLHDL 2.7 01/09/2019 09:56 AM   CHOLHDL 3.0 04/14/2015 09:52 AM   LDLCALC 66 01/09/2019 09:56 AM   LDLDIRECT 78.1 01/15/2013 07:47 AM     Physical Exam:    VS:  BP (!) 145/89   Pulse 64   Ht 6' (1.829 m)   Wt (!) 310 lb (140.6 kg)   BMI 42.04  kg/m     Wt Readings from Last 3 Encounters:  01/09/19 (!) 310 lb (140.6 kg)  04/05/18 (!) 310 lb (140.6 kg)  03/08/18 (!) 311 lb (141.1 kg)     Physical Exam  Constitutional: He is oriented to person, place, and time. He appears well-developed and well-nourished. No distress.  HENT:  Head: Normocephalic and atraumatic.  Eyes: No scleral icterus.  Neck: No JVD present. No thyromegaly present.  Cardiovascular: Normal rate, regular rhythm and normal heart sounds.  No murmur heard. Pulmonary/Chest: Effort normal and breath  sounds normal. He has no rales.  Abdominal: Soft. There is no hepatomegaly.  Musculoskeletal:        General: No edema.  Lymphadenopathy:    He has no cervical adenopathy.  Neurological: He is alert and oriented to person, place, and time.  Skin: Skin is warm and dry.  Psychiatric: He has a normal mood and affect.    ASSESSMENT & PLAN:    1. Coronary artery disease involving native coronary artery of native heart without angina pectoris History of Cypher DES to the RCA, PDA and LCx in 2005.  Exercise treadmill test in October 2018 was negative for ischemia.  He is in need of renewal of his DOT license.  However, he is not sure if that has been waived this year due to COVID-19.  We are currently not doing routine treadmill tests.  I explained to him that if he does need a stress test, it will have to be a [employer].  He will check with DOT to see if he needs a stress test and let us know.  Continue aspirin, Plavix, atorvastatin.  2. Essential hypertension Blood pressure uncontrolled.  He stopped chlorthalidone secondary to muscle cramps.  He does a lot of work outside and sweats.  This is probably not a great choice for him long-term.  Continue current dose of amlodipine.  Increase valsartan to 160 mg daily.  Obtain follow-up BMET and blood pressure check in 2 weeks.  3. Mixed hyperlipidemia Continue statin therapy.  Obtain follow-up CMET, lipids today as he  is fasting.  4. AAA (abdominal aortic aneurysm) without rupture ([place]) 5. S/P AAA (abdominal aortic aneurysm) repair Follow-up with [facility] as planned.   Dispo:  Return in about 1 year (around 01/09/2020) for Routine Follow Up, w/ Dr. Burt Knack, or Richardson Dopp, PA-C.   Medication Adjustments/Labs and Tests Ordered: Current medicines are reviewed at length with the patient today.  Concerns regarding medicines are outlined above.  Tests Ordered: Orders Placed This Encounter  Procedures  . Lipid Profile  . Comp Met (CMET)  . Basic metabolic panel  . EKG 12-Lead   Medication Changes: Meds ordered this encounter  Medications  . valsartan (DIOVAN) 160 MG tablet    Sig: Take 1 tablet (160 mg total) by mouth daily.    Dispense:  90 tablet    Refill:  3    Signed, Richardson Dopp, PA-C  01/09/2019 4:57 PM    [facility] [address], [place], [place]  [address] Phone: [phone]; Fax: [phone]

## 2019-01-09 ENCOUNTER — Ambulatory Visit: Payer: PPO | Admitting: Physician Assistant

## 2019-01-09 ENCOUNTER — Encounter: Payer: Self-pay | Admitting: Physician Assistant

## 2019-01-09 ENCOUNTER — Other Ambulatory Visit: Payer: Self-pay

## 2019-01-09 VITALS — BP 145/89 | HR 64 | Ht 72.0 in | Wt 310.0 lb

## 2019-01-09 DIAGNOSIS — E782 Mixed hyperlipidemia: Secondary | ICD-10-CM

## 2019-01-09 DIAGNOSIS — Z8679 Personal history of other diseases of the circulatory system: Secondary | ICD-10-CM | POA: Diagnosis not present

## 2019-01-09 DIAGNOSIS — Z9889 Other specified postprocedural states: Secondary | ICD-10-CM

## 2019-01-09 DIAGNOSIS — I714 Abdominal aortic aneurysm, without rupture, unspecified: Secondary | ICD-10-CM

## 2019-01-09 DIAGNOSIS — I10 Essential (primary) hypertension: Secondary | ICD-10-CM

## 2019-01-09 DIAGNOSIS — I2510 Atherosclerotic heart disease of native coronary artery without angina pectoris: Secondary | ICD-10-CM | POA: Diagnosis not present

## 2019-01-09 LAB — LIPID PANEL
Chol/HDL Ratio: 2.7 ratio (ref 0.0–5.0)
Cholesterol, Total: 134 mg/dL (ref 100–199)
HDL: 50 mg/dL (ref >39)
LDL Calculated: 66 mg/dL (ref 0–99)
Triglycerides: 88 mg/dL (ref 0–149)
VLDL Cholesterol Cal: 18 mg/dL (ref 5–40)

## 2019-01-09 LAB — COMPREHENSIVE METABOLIC PANEL
ALT: 86 IU/L — ABNORMAL HIGH (ref 0–44)
AST: 86 IU/L — ABNORMAL HIGH (ref 0–40)
Albumin/Globulin Ratio: 1.6 (ref 1.2–2.2)
Albumin: 4.2 g/dL (ref 3.8–4.8)
Alkaline Phosphatase: 105 IU/L (ref 39–117)
BUN/Creatinine Ratio: 17 (ref 10–24)
BUN: 17 mg/dL (ref 8–27)
Bilirubin Total: 0.7 mg/dL (ref 0.0–1.2)
CO2: 23 mmol/L (ref 20–29)
Calcium: 9.4 mg/dL (ref 8.6–10.2)
Chloride: 100 mmol/L (ref 96–106)
Creatinine, Ser: 1.02 mg/dL (ref 0.76–1.27)
GFR calc Af Amer: 88 mL/min/{1.73_m2} (ref >59)
GFR calc non Af Amer: 76 mL/min/{1.73_m2} (ref >59)
Globulin, Total: 2.6 g/dL (ref 1.5–4.5)
Glucose: 106 mg/dL — ABNORMAL HIGH (ref 65–99)
Potassium: 4.8 mmol/L (ref 3.5–5.2)
Sodium: 137 mmol/L (ref 134–144)
Total Protein: 6.8 g/dL (ref 6.0–8.5)

## 2019-01-09 MED ORDER — VALSARTAN 160 MG PO TABS: 160.0000 mg | ORAL_TABLET | Freq: Every day | ORAL | 3 refills | Status: DC

## 2019-01-09 NOTE — Patient Instructions (Signed)
Medication Instructions:  INCREASE Valartan to 160 mg Daily If you need a refill on your cardiac medications before your next appointment, please call your pharmacy.   Lab work:TODAY FLP/CMET  2 WEEKS schedule BMET If you have labs (blood work) drawn today and your tests are completely normal, you will receive your results only by: Marland Kitchen MyChart Message (if you have MyChart) OR . A paper copy in the mail If you have any lab test that is abnormal or we need to change your treatment, we will call you to review the results.  Testing/Procedures: NONE  Follow-Up: At [facility], you and your health needs are our priority.  As part of our continuing mission to provide you with exceptional heart care, we have created designated Provider Care Teams.  These Care Teams include your primary Cardiologist (physician) and Advanced Practice Providers (APPs -  Physician Assistants and Nurse Practitioners) who all work together to provide you with the care you need, when you need it. You will need a follow up appointment in:  1 years.  Please call our office 2 months in advance to schedule this appointment.  You may see Sherren Mocha, MD or one of the following Advanced Practice Providers on your designated Care Team: Richardson Dopp, PA-C [place], [place] . Daune Perch, NP  Any Other Special Instructions Will Be Listed Below (If Applicable). Please schedule BP check with HTN clinic 2 WEEKS, same day as BMET

## 2019-01-22 NOTE — Progress Notes (Signed)
Patient ID: Alfred [name]                 DOB: September 13, 1950                      MRN: [id number]     HPI: KERION WIEBELHAUS is a 68 y.o. male of Dr. Burt Knack who presents today to HTN clinic. PMH is significant for HTN, CAD with DES to RCA and CFX, HLD, stroke, and s/p AAA with endovascular repair in 2013. He was previously seen in HTN clinic in 2019 with controlled BP at the time. However, most recent appt on 01/09/2019 with physician assistant Richardson Dopp showed an elevated blood pressure of 145/89 mmHg. Valsartan was increased to 160mg  daily and pt presents today for follow up.  Pt presents today for f/u appt in PharmD clinic for HTN management. Pt confirms current HTN regimen (amlodipine, valsartan, metoprolol). Pt tolerating valsartan increase (no s/sx of hypotension). Pt has noticed over the past year that his BP has been trending higher in clinic settings; systolic typically around 140 mmHg. 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   Current HTN meds:  Amlodipine 10 mg daily Valsartan 160 mg daily Metoprolol succinate 50 mg daily  Previously tried:  HCTZ (fatigue)  Chlorthalidone (muscle cramps) Losartan (med was on backorder so switched to valsartan 08/2018) Spironolactone (GI discomfort)  BP goal: <130/80 mmHg  Family History: father (HTN, CAD, DM, colon polyps, lung cancer), maternal grandfather (CAD), paternal grandfather (CAD)  Social History: Former smoker, quit in 1995 with a 27 pack-year history. Continues to use Snuff1 can/day. Reports that he drinks about 2-3 shots of Vodka per night with an occasional beer. Denies illicit drug use.  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 Might stop at fast food once per day, has to eat whatever he can find. If he is tired he will eat cheese crackers and drink mountain dew/red bull. He admits to addingsalt to his foodbut is trying to limit intake.   Exercise:Doesn't exercise due to his job.  Home BP readings:Does not check at home  Wt Readings from Last 3 Encounters:  01/09/19 (!) 310 lb (140.6 kg)  04/05/18 (!) 310 lb (140.6 kg)  03/08/18 (!) 311 lb (141.1 kg)   BP Readings from Last 3 Encounters:  01/09/19 (!) 145/89  04/05/18 129/73  03/08/18 120/78   Pulse Readings from Last 3 Encounters:  01/09/19 64  04/05/18 61  03/08/18 64    Renal function: Estimated Creatinine Clearance: 102.2 mL/min (by C-G formula based on SCr of 1.02 mg/dL).  Past Medical History:  Diagnosis Date  . AAA (abdominal aortic aneurysm) ([place]) 08/2011   see Surgical history  . Anginal pain ([place])   . Arthritis    "knees; not bad"  . CAD (coronary artery disease)    DES to RCA and CFX; Dr. Burt Knack cardiologist  . Depression ~ 2009   'treated for ~ 6 months"  . Dyslipidemia   . HTN (hypertension)    unspecified essential  . Hx of adenomatous colonic polyps 2008  . Hyperplasia of prostate without lower urinary tract symptoms (LUTS)   . Left lumbar radiculopathy   . Other and unspecified hyperlipidemia   . Stroke [facility])     Current Outpatient Medications on File Prior to Visit  Medication Sig Dispense Refill  . acetaminophen (TYLENOL) 500  MG tablet Take 500 mg by mouth every 6 (six) hours as needed for mild pain. PRN for pain/fever    . amLODipine (NORVASC) 10 MG tablet Take 1 tablet (10 mg total) by mouth daily. 90 tablet 3  . aspirin 81 MG tablet Take 81 mg by mouth at bedtime.     Marland Kitchen atorvastatin (LIPITOR) 10 MG tablet TAKE 1 TABLET BY MOUTH DAILY AT 6 PM. 90 tablet 3  . clopidogrel (PLAVIX) 75 MG tablet Take 1 tablet (75 mg total) by mouth daily. 90 tablet 3  . famotidine (PEPCID) 20 MG tablet Take 20 mg by mouth daily as needed for heartburn or indigestion.     . famotidine (PEPCID) 40 MG tablet Take 1 tablet by mouth daily.    . meloxicam (MOBIC) 15 MG tablet Take 1 tablet by mouth daily.    . metoprolol succinate (TOPROL-XL) 50 MG 24  hr tablet TAKE 1 TABLET (50 MG TOTAL) BY MOUTH DAILY. TAKE WITH OR IMMEDIATELY FOLLOWING A MEAL. 90 tablet 3  . valsartan (DIOVAN) 160 MG tablet Take 1 tablet (160 mg total) by mouth daily. 90 tablet 3   Current Facility-Administered Medications on File Prior to Visit  Medication Dose Route Frequency Provider Last Rate Last Dose  . 0.9 %  sodium chloride infusion  500 mL Intravenous Once Ladene Artist, MD        No Known Allergies   Assessment/Plan:  1. Hypertension - BP goal <130/80 mmHg. Patient's blood pressure was elevated during last appt with Richardson Dopp and during two readings taken at this appt. However, the pt's BP was checked today after a heated discussion on the phone with his lawyer. Plan to call pt tomorrow with his labs to determine effect of recent valsartan increase on Scr/K levels. Instructed patient to start checking BP reading daily. After determining results of Scr, K, and BP readings will follow up with patient on September 1st 2020 via telephone to discuss adjustment to HTN regimen. If BP remains elevated then plan to increase valsartan to 320 mg daily. Educated pt thoroughly on the importance of a healthy lifestyle. Pt agreed to trying to choose healthier diet alternatives (e.g., eating hamburger without bun, eating subway sandwich rather than fried food, limiting caffeine intake) and to incorporate walking for a portion of his 30 minute lunch break. Encouraged pt for motivation to incorporate nonpharmacologic methods into his lifestyle. Will continue to f/u about diet/exercise at future appts.   Thank you for involving pharmacy to assist in providing Mr.Vigeant's care.  Drexel Iha, PharmD PGY2 Ambulatory Care Pharmacy Resident

## 2019-01-22 NOTE — Patient Instructions (Addendum)
It was a pleasure seeing you in clinic today Alfred [name]!  Today the plan is...  1. Start taking blood pressure reading every day.   2. Try to eat healthier food choices (example: no bun with hamburger, subway sandwich rather than fried food).  3. Try to walk for at least 5 minutes during daily 30-min break.  4. I will call you in 1-2 days (August 26-28) to inform you of your lab results and how discuss how to adjust medicine appropriately.   Please call the PharmD clinic at [phone] if you have any questions that you would like to speak with a pharmacist about [facility], [place], [place]).

## 2019-01-23 ENCOUNTER — Other Ambulatory Visit: Payer: PPO

## 2019-01-23 ENCOUNTER — Ambulatory Visit (INDEPENDENT_AMBULATORY_CARE_PROVIDER_SITE_OTHER): Payer: PPO | Admitting: Pharmacist

## 2019-01-23 ENCOUNTER — Other Ambulatory Visit: Payer: Self-pay

## 2019-01-23 VITALS — BP 140/88 | HR 69

## 2019-01-23 DIAGNOSIS — E782 Mixed hyperlipidemia: Secondary | ICD-10-CM

## 2019-01-23 DIAGNOSIS — R7309 Other abnormal glucose: Secondary | ICD-10-CM

## 2019-01-23 DIAGNOSIS — I2510 Atherosclerotic heart disease of native coronary artery without angina pectoris: Secondary | ICD-10-CM

## 2019-01-23 DIAGNOSIS — I10 Essential (primary) hypertension: Secondary | ICD-10-CM

## 2019-01-23 DIAGNOSIS — Z8679 Personal history of other diseases of the circulatory system: Secondary | ICD-10-CM | POA: Diagnosis not present

## 2019-01-23 DIAGNOSIS — I714 Abdominal aortic aneurysm, without rupture, unspecified: Secondary | ICD-10-CM

## 2019-01-23 DIAGNOSIS — Z9889 Other specified postprocedural states: Secondary | ICD-10-CM

## 2019-01-24 ENCOUNTER — Telehealth: Payer: Self-pay | Admitting: Pharmacist

## 2019-01-24 LAB — HEMOGLOBIN A1C
Est. average glucose Bld gHb Est-mCnc: 114 mg/dL
Hgb A1c MFr Bld: 5.6 % (ref 4.8–5.6)

## 2019-01-24 LAB — BASIC METABOLIC PANEL
BUN/Creatinine Ratio: 21 (ref 10–24)
BUN: 22 mg/dL (ref 8–27)
CO2: 24 mmol/L (ref 20–29)
Calcium: 9.4 mg/dL (ref 8.6–10.2)
Chloride: 103 mmol/L (ref 96–106)
Creatinine, Ser: 1.07 mg/dL (ref 0.76–1.27)
GFR calc Af Amer: 83 mL/min/{1.73_m2} (ref >59)
GFR calc non Af Amer: 71 mL/min/{1.73_m2} (ref >59)
Glucose: 93 mg/dL (ref 65–99)
Potassium: 4.4 mmol/L (ref 3.5–5.2)
Sodium: 141 mmol/L (ref 134–144)

## 2019-01-24 NOTE — Telephone Encounter (Signed)
Called pt on 01/24/19 at 10:55 AM to discuss recent lab results drawn 01/23/19. Informed pt kidney function and potassium levels were stable. Discussed with pt to take BP readings for 1 week. Plan to call pt on 01/30/19 to assess BP readings and HTN medication regimen. Pt verbalized understanding.

## 2019-01-25 ENCOUNTER — Ambulatory Visit (INDEPENDENT_AMBULATORY_CARE_PROVIDER_SITE_OTHER): Payer: PPO | Admitting: Family Medicine

## 2019-01-25 ENCOUNTER — Other Ambulatory Visit: Payer: Self-pay

## 2019-01-25 ENCOUNTER — Encounter: Payer: Self-pay | Admitting: Family Medicine

## 2019-01-25 VITALS — BP 137/82 | HR 63 | Temp 97.6°F | Ht 72.0 in | Wt 313.2 lb

## 2019-01-25 DIAGNOSIS — L570 Actinic keratosis: Secondary | ICD-10-CM

## 2019-01-25 DIAGNOSIS — Z125 Encounter for screening for malignant neoplasm of prostate: Secondary | ICD-10-CM | POA: Diagnosis not present

## 2019-01-25 DIAGNOSIS — E785 Hyperlipidemia, unspecified: Secondary | ICD-10-CM | POA: Diagnosis not present

## 2019-01-25 DIAGNOSIS — Z23 Encounter for immunization: Secondary | ICD-10-CM

## 2019-01-25 DIAGNOSIS — Z0000 Encounter for general adult medical examination without abnormal findings: Secondary | ICD-10-CM

## 2019-01-25 DIAGNOSIS — Z0001 Encounter for general adult medical examination with abnormal findings: Secondary | ICD-10-CM

## 2019-01-25 DIAGNOSIS — D692 Other nonthrombocytopenic purpura: Secondary | ICD-10-CM | POA: Diagnosis not present

## 2019-01-25 DIAGNOSIS — Z6841 Body Mass Index (BMI) 40.0 and over, adult: Secondary | ICD-10-CM | POA: Diagnosis not present

## 2019-01-25 DIAGNOSIS — I10 Essential (primary) hypertension: Secondary | ICD-10-CM

## 2019-01-25 LAB — PSA, MEDICARE: PSA: 0.16 ng/ml (ref 0.10–4.00)

## 2019-01-25 NOTE — Progress Notes (Signed)
Chief Complaint:  Alfred [name] is a 68 y.o. male who presents today for his annual comprehensive physical exam and initial medicare annual wellness visit.    Assessment/Plan:  HYPERTENSION, BENIGN At goal on current medications.  Hyperlipidemia Continue Lipitor 10 mg daily. Last LDL 66.   Senile purpura (HCC) Due to antiplatelets.  Reassured patient.  Actinic Keratosis  Cryotherapy performed today.  See below procedure note.  Tolerated well.  Body mass index is 42.48 kg/m. / Morbid Obesity BMI Metric Follow Up - 01/25/19 0928      BMI Metric Follow Up-Please document annually   BMI Metric Follow Up  Education provided        Preventative Healthcare: Flu vaccine given today.  Will be due for pneumonia vaccine next year.  Check PSA for prostate cancer screening.  Due for colon cancer screening next year.  Patient Counseling(The following topics were reviewed and/or handout was given):  -Nutrition: Stressed importance of moderation in sodium/caffeine intake, saturated fat and cholesterol, caloric balance, sufficient intake of fresh fruits, vegetables, and fiber.  -Stressed the importance of regular exercise.   -Substance Abuse: Discussed cessation/primary prevention of tobacco, alcohol, or other drug use; driving or other dangerous activities under the influence; availability of treatment for abuse.   -Injury prevention: Discussed safety belts, safety helmets, smoke detector, smoking near bedding or upholstery.   -Sexuality: Discussed sexually transmitted diseases, partner selection, use of condoms, avoidance of unintended pregnancy and contraceptive alternatives.   -Dental health: Discussed importance of regular tooth brushing, flossing, and dental visits.  -Health maintenance and immunizations reviewed. Please refer to Health maintenance section.  During the course of the visit the patient was educated and counseled about appropriate screening and preventive services  including:        Fall prevention   Nutrition Physical Activity Weight Management Cognition  Return to care in 1 year for next preventative visit.     Subjective:  HPI:  Health Risk Assessment: Patient considers his overall health to be good. He has no difficulty performing the following: . Preparing food and eating . Bathing  . Getting dressed . Using the toilet . Shopping . Managing Finances . Moving around from place to place  He has not had any falls within the past year.   Depression screen [facility] 2/9 01/25/2019  Decreased Interest 0  Down, Depressed, Hopeless 0  PHQ - 2 Score 0   Lifestyle Factors: Diet: No specific diets.  Exercise: Active at work. No specific exercises.   Patient Care Team: Vivi Barrack, MD as PCP - General (Family Medicine) Sherren Mocha, MD as PCP - Cardiology (Cardiology)   His chronic medical conditions are outlined below:  # Hypertension - On norvasc 10mg  daily, chlorthalidone 25mg  daily, valsartan 160 mg daily, and metoprolol succinate 50mg  daily. - Tolerating medications well without side effects - ROS: No reported CP or COB  # Dyslipidemia - On lipitor 10mg  daily and doing well - ROS: No reported mylagias  # AAA s/p repair - On aspirin 81mg  daily and plavix 75 mg daily - On lipitor 10mg  daily  ROS: Per HPI, otherwise a complete review of systems was negative.   PMH:  The following were reviewed and entered/updated in epic: Past Medical History:  Diagnosis Date  . AAA (abdominal aortic aneurysm) ([place]) 08/2011   see Surgical history  . Anginal pain ([place])   . Arthritis    "knees; not bad"  . CAD (coronary artery disease)    DES to  RCA and CFX; Dr. Burt Knack cardiologist  . Depression ~ 2009   'treated for ~ 6 months"  . Dyslipidemia   . HTN (hypertension)    unspecified essential  . Hx of adenomatous colonic polyps 2008  . Hyperplasia of prostate without lower urinary tract symptoms (LUTS)   . Left lumbar  radiculopathy   . Other and unspecified hyperlipidemia   . Stroke [facility])    Patient Active Problem List   Diagnosis Date Noted  . Senile purpura ([place]) 01/25/2018  . CAD (coronary artery disease)   . HYPERTENSION, BENIGN 05/02/2009  . History of AAA (abdominal aortic aneurysm) repair 11/05/2008  . COLONIC POLYPS, ADENOMATOUS 03/28/2008  . Hyperlipidemia 03/28/2008  . LUMBAR RADICULOPATHY, LEFT 03/28/2008   Past Surgical History:  Procedure Laterality Date  . ABDOMINAL AORTIC ANEURYSM REPAIR  09/06/2011  . ABDOMINAL AORTIC ANEURYSM REPAIR  01/21/2012   Procedure: ANEURYSM ABDOMINAL AORTIC REPAIR;  Surgeon: Elam Dutch, MD;  Location: [place];  Service: Vascular;  Laterality: N/A;  Aortio- bi-iliac repair  using Cryolife descending thoracic aorta 83mm x10.5 cm.  . APPENDECTOMY  1970  . ARTERY REPAIR  09/06/2011   Procedure: ARTERY REPAIR;  Surgeon: Elam Dutch, MD;  Location: [place];  Service: Vascular;  Laterality: Bilateral;  . COLONOSCOPY W/ POLYPECTOMY  2008   adenomatous  & hyperplastic polyp;due 2013  . CORONARY ANGIOPLASTY WITH STENT PLACEMENT  2002; 2005   "2 +1; total of 3"  . CYSTOSCOPY  2005   varicose veins in bladder, Dr Diona Fanti  . [place]  . VASECTOMY      Family History  Problem Relation Age of Onset  . Coronary artery disease Father        CABG in 28s  . Diabetes Father   . Colon polyps Father   . Lung cancer Father        non-small cell carcinoma/bone CA  . Hypertension Father   . Coronary artery disease Maternal Grandfather   . Coronary artery disease Paternal Grandfather   . Other Mother        varicose veins  . Anesthesia problems Neg Hx   . Stroke Neg Hx     Medications- reviewed and updated Current Outpatient Medications  Medication Sig Dispense Refill  . acetaminophen (TYLENOL) 500 MG tablet Take 500 mg by mouth every 6 (six) hours as needed for mild pain. PRN for pain/fever    . amLODipine (NORVASC) 10 MG  tablet Take 1 tablet (10 mg total) by mouth daily. 90 tablet 3  . aspirin 81 MG tablet Take 81 mg by mouth at bedtime.     Marland Kitchen atorvastatin (LIPITOR) 10 MG tablet TAKE 1 TABLET BY MOUTH DAILY AT 6 PM. 90 tablet 3  . clopidogrel (PLAVIX) 75 MG tablet Take 1 tablet (75 mg total) by mouth daily. 90 tablet 3  . famotidine (PEPCID) 20 MG tablet Take 20 mg by mouth daily as needed for heartburn or indigestion.     . famotidine (PEPCID) 40 MG tablet Take 1 tablet by mouth daily.    . meloxicam (MOBIC) 15 MG tablet Take 1 tablet by mouth daily.    . metoprolol succinate (TOPROL-XL) 50 MG 24 hr tablet TAKE 1 TABLET (50 MG TOTAL) BY MOUTH DAILY. TAKE WITH OR IMMEDIATELY FOLLOWING A MEAL. 90 tablet 3  . valsartan (DIOVAN) 160 MG tablet Take 1 tablet (160 mg total) by mouth daily. 90 tablet 3   No current facility-administered medications for this visit.  Allergies-reviewed and updated No Known Allergies  Social History   Socioeconomic History  . Marital status: Married    Spouse name: Not on file  . Number of children: Not on file  . Years of education: Not on file  . Highest education level: Not on file  Occupational History  . Occupation: Editor, commissioning  . Financial resource strain: Not on file  . Food insecurity    Worry: Not on file    Inability: Not on file  . Transportation needs    Medical: Not on file    Non-medical: Not on file  Tobacco Use  . Smoking status: Former Smoker    Packs/day: 1.00    Years: 27.00    Pack years: 27.00    Types: Cigarettes    Quit date: 05/31/1993    Years since quitting: 25.6  . Smokeless tobacco: Current User    Types: Snuff  . Tobacco comment: smoked 1968-1995, up to 1 ppd. Currently using [place] 1 can / day  Substance and Sexual Activity  . Alcohol use: Yes    Alcohol/week: 21.0 standard drinks    Types: 21 Shots of liquor per week    Comment: 2-3 drinks of Vodka a night, occasional beer  . Drug use: No  . Sexual activity: Yes   Lifestyle  . Physical activity    Days per week: Not on file    Minutes per session: Not on file  . Stress: Not on file  Relationships  . Social Herbalist on phone: Not on file    Gets together: Not on file    Attends religious service: Not on file    Active member of club or organization: Not on file    Attends meetings of clubs or organizations: Not on file    Relationship status: Not on file  Other Topics Concern  . Not on file  Social History Narrative  . Not on file        Objective:  Physical Exam: BP 137/82   Pulse 63   Temp 97.6 F (36.4 C)   Ht 6' (1.829 m)   Wt (!) 313 lb 3.2 oz (142.1 kg)   SpO2 97%   BMI 42.48 kg/m   Body mass index is 42.48 kg/m. Wt Readings from Last 3 Encounters:  01/25/19 (!) 313 lb 3.2 oz (142.1 kg)  01/09/19 (!) 310 lb (140.6 kg)  04/05/18 (!) 310 lb (140.6 kg)  Gen: NAD, resting comfortably HEENT: TMs normal bilaterally. OP clear. No thyromegaly noted.  CV: RRR with no murmurs appreciated Pulm: NWOB, CTAB with no crackles, wheezes, or rhonchi GI: Normal bowel sounds present. Soft, Nontender, Nondistended. MSK: no edema, cyanosis, or clubbing noted Skin: warm, dry.  Several scattered hyperkeratotic lesions on face and bilateral upper extremities. Neuro: CN2-12 grossly intact. Strength 5/5 in upper and lower extremities. Reflexes symmetric and intact bilaterally. No apparent cognitive impairment.  Psych: Normal affect and thought content  Cryotherapy Procedure Note  Pre-operative Diagnosis: Actinic keratosis  Locations: Face, arms, legs  Indications: Therapeutic  Procedure Details  Patient informed of risks (permanent scarring, infection, light or dark discoloration, bleeding, infection, weakness, numbness and recurrence of the lesion) and benefits of the procedure and verbal informed consent obtained.  The areas are treated with liquid nitrogen therapy, frozen until ice ball extended 2 mm beyond lesion, allowed  to thaw, and treated again.  A total of 10 lesions were treated. the patient tolerated procedure well.  The  patient was instructed on post-op care, warned that there may be blister formation, redness and pain. Recommend OTC analgesia as needed for pain.  Condition: Stable  Complications: none.       Algis Greenhouse. Jerline Pain, MD 01/25/2019 9:30 AM

## 2019-01-25 NOTE — Assessment & Plan Note (Signed)
Due to antiplatelets. Reassured patient.  

## 2019-01-25 NOTE — Progress Notes (Signed)
Dr Marigene Ehlers interpretation of your lab work:  Good news! Your PSA is normal. We can check again in a couple of years.    If you have any additional questions, please give [place] a call or send [place] a message through [place].  Take care, Dr Jerline Pain

## 2019-01-25 NOTE — Assessment & Plan Note (Signed)
At goal on current medications.

## 2019-01-25 NOTE — Patient Instructions (Addendum)
It was very nice to see you today!  Keep up the good work!  No changes today.  We will check blood work today.  We will give you your flu vaccine today.  Come back in 1 year for your next physical, or sooner if needed.   Take care, Dr Jerline Pain  Please try these tips to maintain a healthy lifestyle:   Eat at least 3 REAL meals and 1-2 snacks per day.  Aim for no more than 5 hours between eating.  If you eat breakfast, please do so within one hour of getting up.    Obtain twice as many fruits/vegetables as protein or carbohydrate foods for both lunch and dinner. (Half of each meal should be fruits/vegetables, one quarter protein, and one quarter starchy carbs)   Cut down on sweet beverages. This includes juice, soda, and sweet tea.    Exercise at least 150 minutes every week.    Preventive Care 68 Years and Older, Male Preventive care refers to lifestyle choices and visits with your health care provider that can promote health and wellness. This includes:  A yearly physical exam. This is also called an annual well check.  Regular dental and eye exams.  Immunizations.  Screening for certain conditions.  Healthy lifestyle choices, such as diet and exercise. What can I expect for my preventive care visit? Physical exam Your health care provider will check:  Height and weight. These may be used to calculate body mass index (BMI), which is a measurement that tells if you are at a healthy weight.  Heart rate and blood pressure.  Your skin for abnormal spots. Counseling Your health care provider may ask you questions about:  Alcohol, tobacco, and drug use.  Emotional well-being.  Home and relationship well-being.  Sexual activity.  Eating habits.  History of falls.  Memory and ability to understand (cognition).  Work and work Statistician. What immunizations do I need?  Influenza (flu) vaccine  This is recommended every year. Tetanus, diphtheria, and  pertussis (Tdap) vaccine  You may need a Td booster every 10 years. Varicella (chickenpox) vaccine  You may need this vaccine if you have not already been vaccinated. Zoster (shingles) vaccine  You may need this after age 68. Pneumococcal conjugate (PCV13) vaccine  One dose is recommended after age 25. Pneumococcal polysaccharide (PPSV23) vaccine  One dose is recommended after age 68. Measles, mumps, and rubella (MMR) vaccine  You may need at least one dose of MMR if you were born in 1957 or later. You may also need a second dose. Meningococcal conjugate (MenACWY) vaccine  You may need this if you have certain conditions. Hepatitis A vaccine  You may need this if you have certain conditions or if you travel or work in places where you may be exposed to hepatitis A. Hepatitis B vaccine  You may need this if you have certain conditions or if you travel or work in places where you may be exposed to hepatitis B. Haemophilus influenzae type b (Hib) vaccine  You may need this if you have certain conditions. You may receive vaccines as individual doses or as more than one vaccine together in one shot (combination vaccines). Talk with your health care provider about the risks and benefits of combination vaccines. What tests do I need? Blood tests  Lipid and cholesterol levels. These may be checked every 5 years, or more frequently depending on your overall health.  Hepatitis C test.  Hepatitis B test. Screening  Lung cancer  screening. You may have this screening every year starting at age 68 if you have a 30-pack-year history of smoking and currently smoke or have quit within the past 15 years.  Colorectal cancer screening. All adults should have this screening starting at age 68 and continuing until age 64. Your health care provider may recommend screening at age 1 if you are at increased risk. You will have tests every 1-10 years, depending on your results and the type of  screening test.  Prostate cancer screening. Recommendations will vary depending on your family history and other risks.  Diabetes screening. This is done by checking your blood sugar (glucose) after you have not eaten for a while (fasting). You may have this done every 1-3 years.  Abdominal aortic aneurysm (AAA) screening. You may need this if you are a current or former smoker.  Sexually transmitted disease (STD) testing. Follow these instructions at home: Eating and drinking  Eat a diet that includes fresh fruits and vegetables, whole grains, lean protein, and low-fat dairy products. Limit your intake of foods with high amounts of sugar, saturated fats, and salt.  Take vitamin and mineral supplements as recommended by your health care provider.  Do not drink alcohol if your health care provider tells you not to drink.  If you drink alcohol: ? Limit how much you have to 0-2 drinks a day. ? Be aware of how much alcohol is in your drink. In the [place] one drink equals one 12 oz bottle of beer (355 mL), one 5 oz glass of wine (148 mL), or one 1 oz glass of hard liquor (44 mL). Lifestyle  Take daily care of your teeth and gums.  Stay active. Exercise for at least 30 minutes on 5 or more days each week.  Do not use any products that contain nicotine or tobacco, such as cigarettes, e-cigarettes, and chewing tobacco. If you need help quitting, ask your health care provider.  If you are sexually active, practice safe sex. Use a condom or other form of protection to prevent STIs (sexually transmitted infections).  Talk with your health care provider about taking a low-dose aspirin or statin. What's next?  Visit your health care provider once a year for a well check visit.  Ask your health care provider how often you should have your eyes and teeth checked.  Stay up to date on all vaccines. This information is not intended to replace advice given to you by your health care provider.  Make sure you discuss any questions you have with your health care provider. Document Released: 06/13/2015 Document Revised: 05/11/2018 Document Reviewed: 05/11/2018 Elsevier Patient Education  2020 [employer].

## 2019-01-25 NOTE — Assessment & Plan Note (Addendum)
Continue Lipitor 10 mg daily. Last LDL 66.

## 2019-01-30 ENCOUNTER — Telehealth: Payer: Self-pay | Admitting: Pharmacist
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
## 2019-02-13 ENCOUNTER — Telehealth: Payer: Self-pay | Admitting: Pharmacist
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
## 2019-03-19 DIAGNOSIS — I2510 Atherosclerotic heart disease of native coronary artery without angina pectoris: Secondary | ICD-10-CM

## 2019-03-19 NOTE — Telephone Encounter (Signed)
Per Dr. Burt Knack, called the patient to offer lexiscan myoview prior to October 28 to meet DOT requirements. The patient states he cannot afford a lexiscan but would be willing to pay for a GXT.  Spoke with the GXT scheduler who states stress test cannot be scheduled prior to 10/28.  Informed the patient the scheduler will call him to arrange first available stress test and letter will be released to MyChart asking for extension for DOT. The patient was grateful for assistance.

## 2019-03-24 ENCOUNTER — Other Ambulatory Visit: Payer: Self-pay | Admitting: Cardiovascular Disease

## 2019-04-01 ENCOUNTER — Other Ambulatory Visit: Payer: Self-pay | Admitting: Cardiovascular Disease

## 2019-04-03 ENCOUNTER — Other Ambulatory Visit (HOSPITAL_COMMUNITY)
Admission: RE | Admit: 2019-04-03 | Discharge: 2019-04-03 | Disposition: A | Discharge status: Home / Self Care | Payer: PPO | Source: Ambulatory Visit | Attending: Cardiovascular Disease | Admitting: Cardiovascular Disease

## 2019-04-03 DIAGNOSIS — Z01812 Encounter for preprocedural laboratory examination: Secondary | ICD-10-CM | POA: Diagnosis not present

## 2019-04-03 DIAGNOSIS — Z20828 Contact with and (suspected) exposure to other viral communicable diseases: Secondary | ICD-10-CM | POA: Diagnosis not present

## 2019-04-04 ENCOUNTER — Telehealth (HOSPITAL_COMMUNITY): Payer: Self-pay

## 2019-04-04 LAB — NOVEL CORONAVIRUS, NAA (HOSP ORDER, SEND-OUT TO REF LAB; TAT 18-24 HRS): SARS-CoV-2, NAA: NOT DETECTED

## 2019-04-04 NOTE — Telephone Encounter (Signed)
Encounter complete. 

## 2019-04-06 ENCOUNTER — Ambulatory Visit (HOSPITAL_COMMUNITY)
Admission: RE | Admit: 2019-04-06 | Discharge: 2019-04-06 | Disposition: A | Discharge status: Home / Self Care | Payer: PPO | Source: Ambulatory Visit | Attending: Internal Medicine | Admitting: Internal Medicine

## 2019-04-06 ENCOUNTER — Other Ambulatory Visit: Payer: Self-pay

## 2019-04-06 DIAGNOSIS — I2510 Atherosclerotic heart disease of native coronary artery without angina pectoris: Secondary | ICD-10-CM | POA: Insufficient documentation

## 2019-04-06 LAB — EXERCISE TOLERANCE TEST
Estimated workload: 6.6 METS
Exercise duration (min): 4 min
Exercise duration (sec): 45 s
MPHR: 153 {beats}/min
Peak HR: 136 {beats}/min
Percent HR: 89 %
Rest HR: 72 {beats}/min

## 2019-04-11 ENCOUNTER — Other Ambulatory Visit: Payer: Self-pay | Admitting: Cardiovascular Disease

## 2019-04-30 ENCOUNTER — Other Ambulatory Visit: Payer: Self-pay

## 2019-04-30 ENCOUNTER — Ambulatory Visit (INDEPENDENT_AMBULATORY_CARE_PROVIDER_SITE_OTHER): Payer: PPO

## 2019-04-30 DIAGNOSIS — Z0000 Encounter for general adult medical examination without abnormal findings: Secondary | ICD-10-CM | POA: Diagnosis not present

## 2019-04-30 NOTE — Progress Notes (Signed)
This visit is being conducted via phone call due to the COVID-19 pandemic. This patient has given me verbal consent via phone to conduct this visit, patient states they are participating from their home address. Some vital signs may be absent or patient reported.   Patient identification: identified by name, DOB, and current address.  Location provider: [facility], Office Persons participating in the virtual visit: Denman George LPN and Dr. Dimas Chyle     Subjective:   Alfred [name] is a 68 y.o. male who presents for an Initial Medicare Annual Wellness Visit.  Review of Systems   Cardiac Risk Factors include: advanced age (>42men, >64 women);male gender;hypertension;smoking/ tobacco exposure   Objective:    There were no vitals filed for this visit. There is no height or weight on file to calculate BMI.  Advanced Directives 04/30/2019 01/22/2016 01/09/2015 01/21/2012 01/18/2012 01/04/2012 12/24/2011  Does Patient Have a Medical Advance Directive? Yes Yes Yes Patient has advance directive, copy not in chart Patient has advance directive, copy not in chart Patient does not have advance directive;Patient would not like information Patient does not have advance directive;Patient would not like information  Type of Advance Directive Living will;Healthcare Power of [place];Living will [place];Living will Healthcare Power of [place] - -  Does patient want to make changes to medical advance directive? No - Patient declined - No - Patient declined - - - -  Copy of [place] in Chart? No - copy requested - - Copy requested from other (Comment) Copy requested from other (Comment) - -  Would patient like information on creating a medical advance directive? - - - - - - -  Pre-existing out of facility DNR order (yellow form or pink MOST form) - - - No - - -    Current Medications (verified) Outpatient  Encounter Medications as of 04/30/2019  Medication Sig  . acetaminophen (TYLENOL) 500 MG tablet Take 500 mg by mouth every 6 (six) hours as needed for mild pain. PRN for pain/fever  . amLODipine (NORVASC) 10 MG tablet TAKE 1 TABLET BY MOUTH EVERY DAY  . aspirin 81 MG tablet Take 81 mg by mouth at bedtime.   Marland Kitchen atorvastatin (LIPITOR) 10 MG tablet TAKE 1 TABLET BY MOUTH DAILY AT 6 PM.  . clopidogrel (PLAVIX) 75 MG tablet TAKE 1 TABLET BY MOUTH EVERY DAY  . meloxicam (MOBIC) 15 MG tablet Take 1 tablet by mouth daily.  . metoprolol succinate (TOPROL-XL) 50 MG 24 hr tablet TAKE 1 TABLET (50 MG TOTAL) BY MOUTH DAILY. TAKE WITH OR IMMEDIATELY FOLLOWING A MEAL.  . valsartan (DIOVAN) 160 MG tablet Take 1 tablet (160 mg total) by mouth daily.  . famotidine (PEPCID) 40 MG tablet Take 1 tablet by mouth daily.  . [DISCONTINUED] famotidine (PEPCID) 20 MG tablet Take 20 mg by mouth daily as needed for heartburn or indigestion.    No facility-administered encounter medications on file as of 04/30/2019.     Allergies (verified) Patient has no known allergies.   History: Past Medical History:  Diagnosis Date  . AAA (abdominal aortic aneurysm) ([place]) 08/2011   see Surgical history  . Anginal pain ([place])   . Arthritis    "knees; not bad"  . CAD (coronary artery disease)    DES to RCA and CFX; Dr. Burt Knack cardiologist  . Depression ~ 2009   'treated for ~ 6 months"  . Dyslipidemia   . HTN (  hypertension)    unspecified essential  . Hx of adenomatous colonic polyps 2008  . Hyperplasia of prostate without lower urinary tract symptoms (LUTS)   . Left lumbar radiculopathy   . Other and unspecified hyperlipidemia   . Stroke [facility])    Past Surgical History:  Procedure Laterality Date  . ABDOMINAL AORTIC ANEURYSM REPAIR  09/06/2011  . ABDOMINAL AORTIC ANEURYSM REPAIR  01/21/2012   Procedure: ANEURYSM ABDOMINAL AORTIC REPAIR;  Surgeon: Elam Dutch, MD;  Location: [facility];  Service: Vascular;  Laterality:  N/A;  Aortio- bi-iliac repair  using Cryolife descending thoracic aorta 33mm x10.5 cm.  . APPENDECTOMY  1970  . ARTERY REPAIR  09/06/2011   Procedure: ARTERY REPAIR;  Surgeon: Elam Dutch, MD;  Location: [facility];  Service: Vascular;  Laterality: Bilateral;  . COLONOSCOPY W/ POLYPECTOMY  2008   adenomatous  & hyperplastic polyp;due 2013  . CORONARY ANGIOPLASTY WITH STENT PLACEMENT  2002; 2005   "2 +1; total of 3"  . CYSTOSCOPY  2005   varicose veins in bladder, Dr Diona Fanti  . [place]  . VASECTOMY     Family History  Problem Relation Age of Onset  . Coronary artery disease Father        CABG in 67s  . Diabetes Father   . Colon polyps Father   . Lung cancer Father        non-small cell carcinoma/bone CA  . Hypertension Father   . Coronary artery disease Maternal Grandfather   . Coronary artery disease Paternal Grandfather   . Other Mother        varicose veins  . Anesthesia problems Neg Hx   . Stroke Neg Hx    Social History   Socioeconomic History  . Marital status: Married    Spouse name: Not on file  . Number of children: Not on file  . Years of education: Not on file  . Highest education level: Not on file  Occupational History  . Occupation: Editor, commissioning  . Financial resource strain: Not on file  . Food insecurity    Worry: Not on file    Inability: Not on file  . Transportation needs    Medical: Not on file    Non-medical: Not on file  Tobacco Use  . Smoking status: Former Smoker    Packs/day: 1.00    Years: 27.00    Pack years: 27.00    Types: Cigarettes    Quit date: 05/31/1993    Years since quitting: 25.9  . Smokeless tobacco: Current User    Types: Snuff  . Tobacco comment: smoked 1968-1995, up to 1 ppd. Currently using Skoal 1 can / day  Substance and Sexual Activity  . Alcohol use: Yes    Alcohol/week: 21.0 standard drinks    Types: 21 Shots of liquor per week    Comment: 2-3 drinks of Vodka a night,  occasional beer  . Drug use: No  . Sexual activity: Yes  Lifestyle  . Physical activity    Days per week: Not on file    Minutes per session: Not on file  . Stress: Not on file  Relationships  . Social Herbalist on phone: Not on file    Gets together: Not on file    Attends religious service: Not on file    Active member of club or organization: Not on file    Attends meetings of clubs or organizations: Not on  file    Relationship status: Not on file  Other Topics Concern  . Not on file  Social History Narrative  . Not on file   Tobacco Counseling Ready to quit: No Counseling given: No Comment: smoked 1968-1995, up to 1 ppd. Currently using Skoal 1 can / day   Clinical Intake:  Pre-visit preparation completed: Yes  Pain : No/denies pain  Diabetes: No  How often do you need to have someone help you when you read instructions, pamphlets, or other written materials from your doctor or pharmacy?: 1 - Never  Interpreter Needed?: No  Information entered by :: Denman George LPN  Activities of Daily Living In your present state of health, do you have any difficulty performing the following activities: 04/30/2019  Hearing? N  Vision? N  Difficulty concentrating or making decisions? N  Walking or climbing stairs? N  Dressing or bathing? N  Doing errands, shopping? N  Preparing Food and eating ? N  Using the Toilet? N  In the past six months, have you accidently leaked urine? N  Do you have problems with loss of bowel control? N  Managing your Medications? N  Managing your Finances? N  Housekeeping or managing your Housekeeping? N  Some recent data might be hidden     Immunizations and Health Maintenance Immunization History  Administered Date(s) Administered  . Fluad Quad(high Dose 65+) 01/25/2019  . Pneumococcal Conjugate-13 01/25/2018   Health Maintenance Due  Topic Date Due  . Hepatitis C Screening  1950-10-27  . PNA vac Low Risk Adult (2 of 2  - PPSV23) 01/26/2019    Patient Care Team: Vivi Barrack, MD as PCP - General (Family Medicine) Sherren Mocha, MD as PCP - Cardiology (Cardiology)  Indicate any recent Medical Services you may have received from other than [facility] providers in the past year (date may be approximate).    Assessment:   This is a routine wellness examination for [place].  Hearing/Vision screen No exam data present  Dietary issues and exercise activities discussed: Current Exercise Habits: The patient has a physically strenuous job, but has no regular exercise apart from work.  Goals   None    Depression Screen PHQ 2/9 Scores 04/30/2019 01/25/2019 01/25/2018 01/16/2013  PHQ - 2 Score 0 0 0 0    Fall Risk Fall Risk  04/30/2019 01/25/2018  Falls in the past year? 0 No  Injury with Fall? 0 -  Follow up Falls evaluation completed;Education provided;Falls prevention discussed -    Is the patient's home free of loose throw rugs in walkways, pet beds, electrical cords, etc?   yes      Grab bars in the bathroom? yes      Handrails on the stairs?   yes      Adequate lighting?   yes  Cognitive Function: no cognitive concerns at this time  Cognitive Testing  Alert? Yes         Normal Appearance? N/a Oriented to person? Yes           Place? Yes  Time? Yes  Recall of three objects? Yes  Can perform simple calculations? Yes  Displays appropriate judgment? Yes  Can read the correct time from a watch face? Yes   Screening Tests Health Maintenance  Topic Date Due  . Hepatitis C Screening  1951/03/27  . PNA vac Low Risk Adult (2 of 2 - PPSV23) 01/26/2019  . TETANUS/TDAP  01/25/2020 (Originally 03/21/1970)  . COLONOSCOPY  04/05/2020  . INFLUENZA  VACCINE  Completed    Qualifies for Shingles Vaccine? Discussed and patient will check with pharmacy for coverage.  Patient education handout provided    Cancer Screenings: Lung: Low Dose CT Chest recommended if Age 74-80 years, 30 pack-year currently  smoking OR have quit w/in 15years. Patient does not qualify. Colorectal: colonoscopy 04/05/18 with Dr Fuller Plan; repeat in 2 years    Plan:  I have personally reviewed and addressed the Medicare Annual Wellness questionnaire and have noted the following in the patient's chart:  A. Medical and social history B. Use of alcohol, tobacco or illicit drugs  C. Current medications and supplements D. Functional ability and status E.  Nutritional status F.  Physical activity G. Advance directives H. List of other physicians I.  Hospitalizations, surgeries, and ER visits in previous 12 months J.  [place] such as hearing and vision if needed, cognitive and depression L. Referrals, records requested, and appointments- none   In addition, I have reviewed and discussed with patient certain preventive protocols, quality metrics, and best practice recommendations. A written personalized care plan for preventive services as well as general preventive health recommendations were provided to patient.   Signed,  Denman George, LPN  Nurse Health Advisor   Nurse Notes: no additional

## 2019-04-30 NOTE — Progress Notes (Signed)
I have personally reviewed the Medicare Annual Wellness Visit and agree with the assessment and plan.  Alfred [name]. Jerline Pain, MD 04/30/2019 12:58 PM

## 2019-04-30 NOTE — Patient Instructions (Signed)
Alfred [name] , Thank you for taking time to come for your Medicare Wellness Visit. I appreciate your ongoing commitment to your health goals. Please review the following plan we discussed and let me know if I can assist you in the future.   Screening recommendations/referrals: Colorectal Screening: up to date; last colonoscopy 04/05/18 with Dr. Fuller Plan (repeat 03/2020)  Vision and Dental Exams: Recommended annual ophthalmology exams for early detection of glaucoma and other disorders of the eye Recommended annual dental exams for proper oral hygiene  Vaccinations: Influenza vaccine: completed 01/25/19 Pneumococcal vaccine: Prevnar 01/25/18; Pneumovax recommended at next visit  Tdap vaccine: recommended; Please call your insurance company to determine your out of pocket expense. You may receive this vaccine at your local pharmacy or Health Dept. Shingles vaccine: Please call your insurance company to determine your out of pocket expense for the Shingrix vaccine. You may receive this vaccine at your local pharmacy.  Advanced directives: Please bring a copy of your POA (Power of Attorney) and/or Living Will to your next appointment.  Goals: Recommend to drink at least 6-8 8oz glasses of water per day and consume a balanced diet rich in fresh fruits and vegetables.   Next appointment: Please schedule your Annual Wellness Visit with your Nurse Health Advisor in one year.  Preventive Care 64 Years and Older, Male Preventive care refers to lifestyle choices and visits with your health care provider that can promote health and wellness. What does preventive care include?  A yearly physical exam. This is also called an annual well check.  Dental exams once or twice a year.  Routine eye exams. Ask your health care provider how often you should have your eyes checked.  Personal lifestyle choices, including:  Daily care of your teeth and gums.  Regular physical activity.  Eating a healthy diet.   Avoiding tobacco and drug use.  Limiting alcohol use.  Practicing safe sex.  Taking low doses of aspirin every day if recommended by your health care provider..  Taking vitamin and mineral supplements as recommended by your health care provider. What happens during an annual well check? The services and screenings done by your health care provider during your annual well check will depend on your age, overall health, lifestyle risk factors, and family history of disease. Counseling  Your health care provider may ask you questions about your:  Alcohol use.  Tobacco use.  Drug use.  Emotional well-being.  Home and relationship well-being.  Sexual activity.  Eating habits.  History of falls.  Memory and ability to understand (cognition).  Work and work Statistician. Screening  You may have the following tests or measurements:  Height, weight, and BMI.  Blood pressure.  Lipid and cholesterol levels. These may be checked every 5 years, or more frequently if you are over 17 years old.  Skin check.  Lung cancer screening. You may have this screening every year starting at age 61 if you have a 30-pack-year history of smoking and currently smoke or have quit within the past 15 years.  Fecal occult blood test (FOBT) of the stool. You may have this test every year starting at age 20.  Flexible sigmoidoscopy or colonoscopy. You may have a sigmoidoscopy every 5 years or a colonoscopy every 10 years starting at age 42.  Prostate cancer screening. Recommendations will vary depending on your family history and other risks.  Hepatitis C blood test.  Hepatitis B blood test.  Sexually transmitted disease (STD) testing.  Diabetes screening. This is  done by checking your blood sugar (glucose) after you have not eaten for a while (fasting). You may have this done every 1-3 years.  Abdominal aortic aneurysm (AAA) screening. You may need this if you are a current or former smoker.   Osteoporosis. You may be screened starting at age 93 if you are at high risk. Talk with your health care provider about your test results, treatment options, and if necessary, the need for more tests. Vaccines  Your health care provider may recommend certain vaccines, such as:  Influenza vaccine. This is recommended every year.  Tetanus, diphtheria, and acellular pertussis (Tdap, Td) vaccine. You may need a Td booster every 10 years.  Zoster vaccine. You may need this after age 12.  Pneumococcal 13-valent conjugate (PCV13) vaccine. One dose is recommended after age 27.  Pneumococcal polysaccharide (PPSV23) vaccine. One dose is recommended after age 21. Talk to your health care provider about which screenings and vaccines you need and how often you need them. This information is not intended to replace advice given to you by your health care provider. Make sure you discuss any questions you have with your health care provider. Document Released: 06/13/2015 Document Revised: 02/04/2016 Document Reviewed: 03/18/2015 Elsevier Interactive Patient Education  2017 [employer].

## 2019-05-04 ENCOUNTER — Other Ambulatory Visit: Payer: Self-pay | Admitting: Cardiovascular Disease

## 2019-05-12 ENCOUNTER — Encounter: Payer: Self-pay | Admitting: Cardiovascular Disease

## 2019-11-07 DIAGNOSIS — L578 Other skin changes due to chronic exposure to nonionizing radiation: Secondary | ICD-10-CM | POA: Diagnosis not present

## 2019-11-07 DIAGNOSIS — L570 Actinic keratosis: Secondary | ICD-10-CM | POA: Diagnosis not present

## 2019-11-07 DIAGNOSIS — L814 Other melanin hyperpigmentation: Secondary | ICD-10-CM | POA: Diagnosis not present

## 2019-11-26 ENCOUNTER — Other Ambulatory Visit: Payer: Self-pay | Admitting: Physician Assistant

## 2019-12-28 ENCOUNTER — Telehealth: Payer: Self-pay | Admitting: Family Medicine

## 2019-12-28 NOTE — Progress Notes (Signed)
  Chronic Care Management   Note  12/28/2019 Name: Alfred [name] MRN: [id number] DOB: December 01, 1950  Alfred [name] is a 69 y.o. year old male who is a primary care patient of Vivi Barrack, MD. I reached out to [name] by phone today in response to a referral sent by Alfred [name] PCP, Vivi Barrack, MD.   Alfred [name] was given information about Chronic Care Management services today including:  1. CCM service includes personalized support from designated clinical staff supervised by his physician, including individualized plan of care and coordination with other care providers 2. 24/7 contact phone numbers for assistance for urgent and routine care needs. 3. Service will only be billed when office clinical staff spend 20 minutes or more in a month to coordinate care. 4. Only one practitioner may furnish and bill the service in a calendar month. 5. The patient may stop CCM services at any time (effective at the end of the month) by phone call to the office staff.   Patient agreed to services and verbal consent obtained.   Follow up plan:   Earney Hamburg Upstream Scheduler

## 2020-01-01 ENCOUNTER — Other Ambulatory Visit: Payer: Self-pay | Admitting: Cardiovascular Disease

## 2020-01-08 ENCOUNTER — Other Ambulatory Visit: Payer: Self-pay | Admitting: Cardiovascular Disease

## 2020-01-15 ENCOUNTER — Ambulatory Visit: Payer: PPO

## 2020-01-15 DIAGNOSIS — I10 Essential (primary) hypertension: Secondary | ICD-10-CM

## 2020-01-15 DIAGNOSIS — E785 Hyperlipidemia, unspecified: Secondary | ICD-10-CM

## 2020-01-15 NOTE — Patient Instructions (Addendum)
Please call me at [phone] (direct line) with any questions - thank you!  - Alfred [name]., Clinical Pharmacist  Goals Addressed            This Visit's Progress   . Patient Alfred [name] (see longitudinal plan of care for additional care plan information)  Current Barriers:  . Chronic Disease Management support, education, and care coordination needs related to Hypertension, Hyperlipidemia, and Coronary Artery Disease   Hypertension BP Readings from Last 3 Encounters:  01/25/19 137/82  01/23/19 140/88  01/09/19 (!) 145/89   . Pharmacist Clinical Goal(s): o Over the next 180 days, patient will work with PharmD and providers to achieve BP goal <130/80 . Current regimen:  . Amlodipine 10 mg once daily . Metoprolol succinate 50 mg once daily  . Valsartan 160 mg once daily  . Interventions: o Home bp monitoring . Patient self care activities - Over the next 180 days, patient will: o Check BP at least once every 1-2 weeks, document, and provide at future appointments o Ensure daily salt intake < 2300 mg/day  Hyperlipidemia Lab Results  Component Value Date/Time   LDLCALC 66 01/09/2019 09:56 AM   LDLDIRECT 78.1 01/15/2013 07:47 AM   . Pharmacist Clinical Goal(s): o Over the next 180 days, patient will work with PharmD and providers to maintain LDL goal < 70 . Current regimen:  o Atorvastatin 10 mg daily . Interventions: o Continue current management . Patient self care activities - Over the next 180 days, patient will: o Continue current management  Coronary Artery Disease . Pharmacist Clinical Goal(s) o Over the next 180 days, patient will work with PharmD and providers to ensure medication safety.  . Current regimen:  o Clopidogrel 75 mg once daily o Aspirin 81 mg once daily . Interventions: o Continue current management o Consider recommendation for high intensity statin . Patient self care activities - Over the next 180 days, patient  will: o Continue current management  Medication management . Pharmacist Clinical Goal(s): o Over the next 180 days, patient will work with PharmD and providers to achieve optimal medication adherence . [facility] . Interventions o Comprehensive medication review performed. o Continue current medication management strategy . Patient self care activities - Over the next 180 days, patient will: o Take medications as prescribed o Report any questions or concerns to PharmD and/or provider(s) Initial goal documentation.      Alfred [name] was given information about Chronic Care Management services today including:  1. CCM service includes personalized support from designated clinical staff supervised by his physician, including individualized plan of care and coordination with other care providers 2. 24/7 contact phone numbers for assistance for urgent and routine care needs. 3. Standard insurance, coinsurance, copays and deductibles apply for chronic care management only during months in which we provide at least 20 minutes of these services. Most insurances cover these services at 100%, however patients may be responsible for any copay, coinsurance and/or deductible if applicable. This service may help you avoid the need for more expensive face-to-face services. 4. Only one practitioner may furnish and bill the service in a calendar month. 5. The patient may stop CCM services at any time (effective at the end of the month) by phone call to the office staff.  Patient agreed to services and verbal consent obtained.   The patient verbalized understanding of instructions provided today and agreed to receive a mailed copy of patient instruction and/or  Scientist, clinical (histocompatibility and immunogenetics). Telephone follow up appointment with pharmacy team member scheduled for: See next appointment with "Care Management Staff" under "What's Next" below.   Alfred [name], Pharm.D., BCGP Clinical Pharmacist [place] [phone] Hypertension, Adult High blood pressure (hypertension) is when the force of blood pumping through the arteries is too strong. The arteries are the blood vessels that carry blood from the heart throughout the body. Hypertension forces the heart to work harder to pump blood and may cause arteries to become narrow or stiff. Untreated or uncontrolled hypertension can cause a heart attack, heart failure, a stroke, kidney disease, and other problems. A blood pressure reading consists of a higher number over a lower number. Ideally, your blood pressure should be below 120/80. The first ("top") number is called the systolic pressure. It is a measure of the pressure in your arteries as your heart beats. The second ("bottom") number is called the diastolic pressure. It is a measure of the pressure in your arteries as the heart relaxes. What are the causes? The exact cause of this condition is not known. There are some conditions that result in or are related to high blood pressure. What increases the risk? Some risk factors for high blood pressure are under your control. The following factors may make you more likely to develop this condition:  Smoking.  Having type 2 diabetes mellitus, high cholesterol, or both.  Not getting enough exercise or physical activity.  Being overweight.  Having too much fat, sugar, calories, or salt (sodium) in your diet.  Drinking too much alcohol. Some risk factors for high blood pressure may be difficult or impossible to change. Some of these factors include:  Having chronic kidney disease.  Having a family history of high blood pressure.  Age. Risk increases with age.  Race. You may be at higher risk if you are African American.  Gender. Men are at higher risk than women before age 24. After age 15, women are at higher risk than men.  Having obstructive sleep apnea.  Stress. What are the signs or symptoms? High  blood pressure may not cause symptoms. Very high blood pressure (hypertensive crisis) may cause:  Headache.  Anxiety.  Shortness of breath.  Nosebleed.  Nausea and vomiting.  Vision changes.  Severe chest pain.  Seizures. How is this diagnosed? This condition is diagnosed by measuring your blood pressure while you are seated, with your arm resting on a flat surface, your legs uncrossed, and your feet flat on the floor. The cuff of the blood pressure monitor will be placed directly against the skin of your upper arm at the level of your heart. It should be measured at least twice using the same arm. Certain conditions can cause a difference in blood pressure between your right and left arms. Certain factors can cause blood pressure readings to be lower or higher than normal for a short period of time:  When your blood pressure is higher when you are in a health care provider's office than when you are at home, this is called white coat hypertension. Most people with this condition do not need medicines.  When your blood pressure is higher at home than when you are in a health care provider's office, this is called masked hypertension. Most people with this condition may need medicines to control blood pressure. If you have a high blood pressure reading during one visit or you have normal blood pressure with other risk factors, you may be asked  to:  Return on a different day to have your blood pressure checked again.  Monitor your blood pressure at home for 1 week or longer. If you are diagnosed with hypertension, you may have other blood or imaging tests to help your health care provider understand your overall risk for other conditions. How is this treated? This condition is treated by making healthy lifestyle changes, such as eating healthy foods, exercising more, and reducing your alcohol intake. Your health care provider may prescribe medicine if lifestyle changes are not enough to  get your blood pressure under control, and if:  Your systolic blood pressure is above 130.  Your diastolic blood pressure is above 80. Your personal target blood pressure may vary depending on your medical conditions, your age, and other factors. Follow these instructions at home: Eating and drinking   Eat a diet that is high in fiber and potassium, and low in sodium, added sugar, and fat. An example eating plan is called the DASH (Dietary Approaches to Stop Hypertension) diet. To eat this way: ? Eat plenty of fresh fruits and vegetables. Try to fill one half of your plate at each meal with fruits and vegetables. ? Eat whole grains, such as whole-wheat pasta, brown rice, or whole-grain bread. Fill about one fourth of your plate with whole grains. ? Eat or drink low-fat dairy products, such as skim milk or low-fat yogurt. ? Avoid fatty cuts of meat, processed or cured meats, and poultry with skin. Fill about one fourth of your plate with lean proteins, such as fish, chicken without skin, beans, eggs, or tofu. ? Avoid pre-made and processed foods. These tend to be higher in sodium, added sugar, and fat.  Reduce your daily sodium intake. Most people with hypertension should eat less than 1,500 mg of sodium a day.  Do not drink alcohol if: ? Your health care provider tells you not to drink. ? You are pregnant, may be pregnant, or are planning to become pregnant.  If you drink alcohol: ? Limit how much you use to:  0-1 drink a day for women.  0-2 drinks a day for men. ? Be aware of how much alcohol is in your drink. In the [place] one drink equals one 12 oz bottle of beer (355 mL), one 5 oz glass of wine (148 mL), or one 1 oz glass of hard liquor (44 mL). Lifestyle   Work with your health care provider to maintain a healthy body weight or to lose weight. Ask what an ideal weight is for you.  Get at least 30 minutes of exercise most days of the week. Activities may include walking,  swimming, or biking.  Include exercise to strengthen your muscles (resistance exercise), such as Pilates or lifting weights, as part of your weekly exercise routine. Try to do these types of exercises for 30 minutes at least 3 days a week.  Do not use any products that contain nicotine or tobacco, such as cigarettes, e-cigarettes, and chewing tobacco. If you need help quitting, ask your health care provider.  Monitor your blood pressure at home as told by your health care provider.  Keep all follow-up visits as told by your health care provider. This is important. Medicines  Take over-the-counter and prescription medicines only as told by your health care provider. Follow directions carefully. Blood pressure medicines must be taken as prescribed.  Do not skip doses of blood pressure medicine. Doing this puts you at risk for problems and can make the medicine  less effective.  Ask your health care provider about side effects or reactions to medicines that you should watch for. Contact a health care provider if you:  Think you are having a reaction to a medicine you are taking.  Have headaches that keep coming back (recurring).  Feel dizzy.  Have swelling in your ankles.  Have trouble with your vision. Get help right away if you:  Develop a severe headache or confusion.  Have unusual weakness or numbness.  Feel faint.  Have severe pain in your chest or abdomen.  Vomit repeatedly.  Have trouble breathing. Summary  Hypertension is when the force of blood pumping through your arteries is too strong. If this condition is not controlled, it may put you at risk for serious complications.  Your personal target blood pressure may vary depending on your medical conditions, your age, and other factors. For most people, a normal blood pressure is less than 120/80.  Hypertension is treated with lifestyle changes, medicines, or a combination of both. Lifestyle changes include losing  weight, eating a healthy, low-sodium diet, exercising more, and limiting alcohol. This information is not intended to replace advice given to you by your health care provider. Make sure you discuss any questions you have with your health care provider. Document Revised: 01/25/2018 Document Reviewed: 01/25/2018 Elsevier Patient Education  2020 [employer].

## 2020-01-15 NOTE — Progress Notes (Signed)
Chronic Care Management Pharmacy  Name: Alfred [name]  MRN: [id number] DOB: December 26, 1950  Chief Complaint/ HPI  Alfred [name],  69 y.o. , male presents for their Initial CCM visit with the clinical pharmacist via telephone due to COVID-19 Pandemic.  Lost 50-60 lbs, feels might be over medicated. Currently not checking on BP at home. Reports having 3-5 stents previously and aortic valve replacement.  Several months ago was at 325 lbs. Current weight is 265-270 lbs after starting on protein-based diet.   PCP : Alfred Barrack, MD  Chronic conditions include:  Encounter Diagnoses  Name Primary?  . Hyperlipidemia, unspecified hyperlipidemia type Yes  . HYPERTENSION, BENIGN      Office Visits:  01/25/2020 (PCP): annual.  Patient Active Problem List   Diagnosis Date Noted  . Senile purpura ([place]) 01/25/2018  . CAD (coronary artery disease)   . HYPERTENSION, BENIGN 05/02/2009  . History of AAA (abdominal aortic aneurysm) repair 11/05/2008  . COLONIC POLYPS, ADENOMATOUS 03/28/2008  . Hyperlipidemia 03/28/2008  . LUMBAR RADICULOPATHY, LEFT 03/28/2008   Past Surgical History:  Procedure Laterality Date  . ABDOMINAL AORTIC ANEURYSM REPAIR  09/06/2011  . ABDOMINAL AORTIC ANEURYSM REPAIR  01/21/2012   Procedure: ANEURYSM ABDOMINAL AORTIC REPAIR;  Surgeon: Elam Dutch, MD;  Location: [facility];  Service: Vascular;  Laterality: N/A;  Aortio- bi-iliac repair  using Cryolife descending thoracic aorta 45m x10.5 cm.  . APPENDECTOMY  1970  . ARTERY REPAIR  09/06/2011   Procedure: ARTERY REPAIR;  Surgeon: CElam Dutch MD;  [facility]  Service: Vascular;  Laterality: Bilateral;  . COLONOSCOPY W/ POLYPECTOMY  2008   adenomatous  & hyperplastic polyp;due 2013  . CORONARY ANGIOPLASTY WITH STENT PLACEMENT  2002; 2005   "2 +1; total of 3"  . CYSTOSCOPY  2005   varicose veins in bladder, Dr dDiona Fanti . [facility] . VASECTOMY     Social History    Socioeconomic History  . Marital status: Married    Spouse name: Not on file  . Number of children: Not on file  . Years of education: Not on file  . Highest education level: Not on file  Occupational History  . Occupation: Farming  Tobacco Use  . Smoking status: Former Smoker    Packs/day: 1.00    Years: 27.00    Pack years: 27.00    Types: Cigarettes    Quit date: 05/31/1993    Years since quitting: 26.6  . Smokeless tobacco: Current User    Types: Snuff  . Tobacco comment: smoked 1968-1995, up to 1 ppd. Currently using Sko[place] 1 can / day  Vaping Use  . Vaping Use: Never used  Substance and Sexual Activity  . Alcohol use: Yes    Alcohol/week: 21.0 standard drinks    Types: 21 Shots of liquor per week    Comment: 2-3 drinks of Vodka a night, occasional beer  . Drug use: No  . Sexual activity: Yes  Other Topics Concern  . Not on file  Social History Narrative  . Not on file   Social Determinants of Health   Financial Resource Strain:   . Difficulty of Paying Living Expenses:   Food Insecurity: No Food Insecurity  . Worried About RCharity fundraiserin the Last Year: Never true  . Ran Out of Food in the Last Year: Never true  Transportation Needs: No Transportation Needs  . Lack of Transportation (Medical): No  . Lack of  Transportation (Non-Medical): No  Physical Activity:   . Days of Exercise per Week:   . Minutes of Exercise per Session:   Stress:   . Feeling of Stress :   Social Connections:   . Frequency of Communication with Friends and Family:   . Frequency of Social Gatherings with Friends and Family:   . Attends Religious Services:   . Active Member of Clubs or Organizations:   . Attends Archivist Meetings:   Marland Kitchen Marital Status:    Family History  Problem Relation Age of Onset  . Coronary artery disease Father        CABG in 26s  . Diabetes Father   . Colon polyps Father   . Lung cancer Father        non-small cell carcinoma/bone CA  .  Hypertension Father   . Coronary artery disease Maternal Grandfather   . Coronary artery disease Paternal Grandfather   . Other Mother        varicose veins  . Anesthesia problems Neg Hx   . Stroke Neg Hx    No Known Allergies Outpatient Encounter Medications as of 01/15/2020  Medication Sig  . acetaminophen (TYLENOL) 500 MG tablet Take 500 mg by mouth every 6 (six) hours as needed for mild pain. PRN for pain/fever  . amLODipine (NORVASC) 10 MG tablet Take 1 tablet (10 mg total) by mouth daily. Patient needs appointment for refills. Please call [phone].  Marland Kitchen aspirin 81 MG tablet Take 81 mg by mouth at bedtime.   Marland Kitchen atorvastatin (LIPITOR) 10 MG tablet TAKE 1 TABLET BY MOUTH DAILY AT 6 PM. Pt needs appointment for refills. Please call [phone].  . clopidogrel (PLAVIX) 75 MG tablet Take 1 tablet (75 mg total) by mouth daily. Please make overdue appt with Dr. Burt Knack before anymore refills. 1st attempt  . metoprolol succinate (TOPROL-XL) 50 MG 24 hr tablet TAKE 1 TABLET (50 MG TOTAL) BY MOUTH DAILY. TAKE WITH OR IMMEDIATELY FOLLOWING A MEAL.  . valsartan (DIOVAN) 160 MG tablet TAKE 1 TABLET BY MOUTH EVERY DAY  . famotidine (PEPCID) 40 MG tablet Take 1 tablet by mouth daily. (Patient not taking: Reported on 01/15/2020)  . meloxicam (MOBIC) 15 MG tablet Take 1 tablet by mouth daily.   No facility-administered encounter medications on file as of 01/15/2020.   Patient Care Team    Relationship Specialty Notifications Start End  Alfred Barrack, MD PCP - General Family Medicine  01/25/18   Sherren Mocha, MD PCP - Cardiology Cardiology Admissions 02/21/18   [name], [facility] Pharmacist Pharmacist  12/28/19    Comment: [phone]   Current Diagnosis/Assessment: Goals Addressed            This Visit's Progress   . Patient Alfred [name] (see longitudinal plan of care for additional care plan information)  Current Barriers:  . Chronic Disease Management support,  education, and care coordination needs related to Hypertension, Hyperlipidemia, and Coronary Artery Disease   Hypertension BP Readings from Last 3 Encounters:  01/25/19 137/82  01/23/19 140/88  01/09/19 (!) 145/89   . Pharmacist Clinical Goal(s): o Over the next 180 days, patient will work with PharmD and providers to achieve BP goal <130/80 . Current regimen:  . Amlodipine 10 mg once daily . Metoprolol succinate 50 mg once daily  . Valsartan 160 mg once daily  . Interventions: o Home bp monitoring . Patient self care activities - Over the next 180 days,  patient will: o Check BP at least once every 1-2 weeks, document, and provide at future appointments o Ensure daily salt intake < 2300 mg/day  Hyperlipidemia Lab Results  Component Value Date/Time   LDLCALC 66 01/09/2019 09:56 AM   LDLDIRECT 78.1 01/15/2013 07:47 AM   . Pharmacist Clinical Goal(s): o Over the next 180 days, patient will work with PharmD and providers to maintain LDL goal < 70 . Current regimen:  o Atorvastatin 10 mg daily . Interventions: o Continue current management . Patient self care activities - Over the next 180 days, patient will: o Continue current management  Coronary Artery Disease . Pharmacist Clinical Goal(s) o Over the next 180 days, patient will work with PharmD and providers to ensure medication safety.  . Current regimen:  o Clopidogrel 75 mg once daily o Aspirin 81 mg once daily . Interventions: o Continue current management o Consider recommendation for high intensity statin . Patient self care activities - Over the next 180 days, patient will: o Continue current management  Medication management . Pharmacist Clinical Goal(s): o Over the next 180 days, patient will work with PharmD and providers to achieve optimal medication adherence . [facility] . Interventions o Comprehensive medication review performed. o Continue current medication management strategy . Patient  self care activities - Over the next 180 days, patient will: o Take medications as prescribed o Report any questions or concerns to PharmD and/or provider(s) Initial goal documentation.      Hypertension   BP goal <130/80  BP Readings from Last 3 Encounters:  01/25/19 137/82  01/23/19 140/88  01/09/19 (!) 145/89   Patient checks BP at home infrequently Patient home BP readings are ranging: n/a.  Patient is currently near goal on the following medications:  . Amlodipine 10 mg once daily . Metoprolol succinate 50 mg once daily  . Valsartan 160 mg once daily  We discussed home bp management, diet/exercise extensively. Would consider purchasing a cuff and monitoring at home if we are able to confirm large/XL cuff at local pharmacy.  Plan  Continue current medications and control with diet and exercise.  CPA to confirm large or extra large cuff at local [employer].  Hyperlipidemia   LDL goal < 70  Lipid Panel     Component Value Date/Time   CHOL 134 01/09/2019 0956   TRIG 88 01/09/2019 0956   TRIG 110 05/26/2006 1118   HDL 50 01/09/2019 0956   LDLCALC 66 01/09/2019 0956   LDLDIRECT 78.1 01/15/2013 0747    Hepatic Function Latest Ref Rng & Units 01/09/2019 03/08/2018 03/07/2017  Total Protein 6.0 - 8.5 g/dL 6.8 6.4 7.0  Albumin 3.8 - 4.8 g/dL 4.2 4.0 4.4  AST 0 - 40 IU/L 86(H) 55(H) 50(H)  ALT 0 - 44 IU/L 86(H) 73(H) 68(H)  Alk Phosphatase 39 - 117 IU/L 105 75 89  Total Bilirubin 0.0 - 1.2 mg/dL 0.7 0.6 0.7  Bilirubin, Direct <=0.2 mg/dL - - -    Relative increase in LFTs previous OV. Patient is currently at goal on the following medications:  . Atorvastatin 10 mg once daily  We discussed:  Diet/exercise.  Plan  Continue current medications and control with diet and exercise.  CAD   AAA, resvacularization, HTN, BMI ~36, low intensity statin. Relative increase in LFTs last OV 12/2019. Current AP regimen: . Clopidogrel 75 mg once daily . Aspirin 81  mg once daily  We discussed:  Counseled on statin use. Coordinated f/u for upcoming cardiologist  visit.  Plan  Continue current medications and control with diet and exercise. PCP visit 12/2019. Cardiologist visit 07/2020.  Vaccines   Immunization History  Administered Date(s) Administered  . Fluad Quad(high Dose 65+) 01/25/2019  . Pneumococcal Conjugate-13 01/25/2018   Reviewed and discussed patient's vaccination history. Pt reports receiving pfizer covid vaccines several months ago. Due for pneumonia, shingrix, tdap according to records.  Plan  Recommended patient receive pneumonia vaccine in office and tdap/shingrix in pharmacy. .   Medication management / care coordination   Receives prescription medications from:  [id number] [place], [place] - [address] [place] [address]Phone: [phone]: [phone]  Continue current management. Denies any issues with current medication management.   Plan  Continue current medication management strategy. Reminded pt of upcoming PCP visit and coordinated scheduling for cardiologist f/u.  SDOH (Social Determinants of Health) assessments performed: Yes. ___________________________ Future Appointments  Date Time Provider [place] 01/28/2020  8:20 AM PVivi Barrack MD [facility] 04/14/2020 10:30 AM [facility] CCM PHARMACIST [facility]  06/06/2020  8:20 AM CS[facility]ren Mocha [facility]   Visit follow-up:  . CPA follow-up: [facility] - confirm BP cuff availability in L/XL. 1 month BP call . [facility] follow-up: 3 month phone visit.  JMadelin Rear Pharm.D., BCGP Clinical Pharmacist [place]([phone]

## 2020-01-16 ENCOUNTER — Other Ambulatory Visit: Payer: Self-pay

## 2020-01-16 ENCOUNTER — Other Ambulatory Visit: Payer: Self-pay | Admitting: General Practice

## 2020-01-16 DIAGNOSIS — E785 Hyperlipidemia, unspecified: Secondary | ICD-10-CM

## 2020-01-16 DIAGNOSIS — I10 Essential (primary) hypertension: Secondary | ICD-10-CM

## 2020-01-16 DIAGNOSIS — I2510 Atherosclerotic heart disease of native coronary artery without angina pectoris: Secondary | ICD-10-CM

## 2020-01-28 ENCOUNTER — Other Ambulatory Visit: Payer: Self-pay

## 2020-01-28 ENCOUNTER — Encounter: Payer: Self-pay | Admitting: Family Medicine

## 2020-01-28 ENCOUNTER — Ambulatory Visit (INDEPENDENT_AMBULATORY_CARE_PROVIDER_SITE_OTHER): Payer: PPO | Admitting: Family Medicine

## 2020-01-28 VITALS — BP 123/85 | HR 58 | Temp 98.2°F | Ht 72.0 in | Wt 276.0 lb

## 2020-01-28 DIAGNOSIS — I2510 Atherosclerotic heart disease of native coronary artery without angina pectoris: Secondary | ICD-10-CM | POA: Diagnosis not present

## 2020-01-28 DIAGNOSIS — L570 Actinic keratosis: Secondary | ICD-10-CM

## 2020-01-28 DIAGNOSIS — Z125 Encounter for screening for malignant neoplasm of prostate: Secondary | ICD-10-CM | POA: Diagnosis not present

## 2020-01-28 DIAGNOSIS — E669 Obesity, unspecified: Secondary | ICD-10-CM

## 2020-01-28 DIAGNOSIS — E785 Hyperlipidemia, unspecified: Secondary | ICD-10-CM | POA: Diagnosis not present

## 2020-01-28 DIAGNOSIS — Z6837 Body mass index (BMI) 37.0-37.9, adult: Secondary | ICD-10-CM

## 2020-01-28 DIAGNOSIS — R739 Hyperglycemia, unspecified: Secondary | ICD-10-CM | POA: Diagnosis not present

## 2020-01-28 DIAGNOSIS — Z0001 Encounter for general adult medical examination with abnormal findings: Secondary | ICD-10-CM

## 2020-01-28 DIAGNOSIS — I10 Essential (primary) hypertension: Secondary | ICD-10-CM

## 2020-01-28 NOTE — Assessment & Plan Note (Signed)
Cryotherapy applied today.  See below procedure note. 

## 2020-01-28 NOTE — Assessment & Plan Note (Signed)
At goal on valsartan 160 mg daily and metoprolol 50 mg daily.

## 2020-01-28 NOTE — Assessment & Plan Note (Signed)
Continue Lipitor 10 mg daily.  Check lipid panel today.

## 2020-01-28 NOTE — Patient Instructions (Signed)
It was very nice to see you today!  Keep up the good work!  We will check blood work today.  Come back in 1 year for your next physical with blood work, or sooner if needed.  Take care, Dr Jerline Pain  Please try these tips to maintain a healthy lifestyle:   Eat at least 3 REAL meals and 1-2 snacks per day.  Aim for no more than 5 hours between eating.  If you eat breakfast, please do so within one hour of getting up.    Each meal should contain half fruits/vegetables, one quarter protein, and one quarter carbs (no bigger than a computer mouse)   Cut down on sweet beverages. This includes juice, soda, and sweet tea.     Drink at least 1 glass of water with each meal and aim for at least 8 glasses per day   Exercise at least 150 minutes every week.    Preventive Care 21 Years and Older, Male Preventive care refers to lifestyle choices and visits with your health care provider that can promote health and wellness. This includes:  A yearly physical exam. This is also called an annual well check.  Regular dental and eye exams.  Immunizations.  Screening for certain conditions.  Healthy lifestyle choices, such as diet and exercise. What can I expect for my preventive care visit? Physical exam Your health care provider will check:  Height and weight. These may be used to calculate body mass index (BMI), which is a measurement that tells if you are at a healthy weight.  Heart rate and blood pressure.  Your skin for abnormal spots. Counseling Your health care provider may ask you questions about:  Alcohol, tobacco, and drug use.  Emotional well-being.  Home and relationship well-being.  Sexual activity.  Eating habits.  History of falls.  Memory and ability to understand (cognition).  Work and work Statistician. What immunizations do I need?  Influenza (flu) vaccine  This is recommended every year. Tetanus, diphtheria, and pertussis (Tdap) vaccine  You  may need a Td booster every 10 years. Varicella (chickenpox) vaccine  You may need this vaccine if you have not already been vaccinated. Zoster (shingles) vaccine  You may need this after age 77. Pneumococcal conjugate (PCV13) vaccine  One dose is recommended after age 9. Pneumococcal polysaccharide (PPSV23) vaccine  One dose is recommended after age 28. Measles, mumps, and rubella (MMR) vaccine  You may need at least one dose of MMR if you were born in 1957 or later. You may also need a second dose. Meningococcal conjugate (MenACWY) vaccine  You may need this if you have certain conditions. Hepatitis A vaccine  You may need this if you have certain conditions or if you travel or work in places where you may be exposed to hepatitis A. Hepatitis B vaccine  You may need this if you have certain conditions or if you travel or work in places where you may be exposed to hepatitis B. Haemophilus influenzae type b (Hib) vaccine  You may need this if you have certain conditions. You may receive vaccines as individual doses or as more than one vaccine together in one shot (combination vaccines). Talk with your health care provider about the risks and benefits of combination vaccines. What tests do I need? Blood tests  Lipid and cholesterol levels. These may be checked every 5 years, or more frequently depending on your overall health.  Hepatitis C test.  Hepatitis B test. Screening  Lung cancer  screening. You may have this screening every year starting at age 70 if you have a 30-pack-year history of smoking and currently smoke or have quit within the past 15 years.  Colorectal cancer screening. All adults should have this screening starting at age 29 and continuing until age 51. Your health care provider may recommend screening at age 62 if you are at increased risk. You will have tests every 1-10 years, depending on your results and the type of screening test.  Prostate cancer  screening. Recommendations will vary depending on your family history and other risks.  Diabetes screening. This is done by checking your blood sugar (glucose) after you have not eaten for a while (fasting). You may have this done every 1-3 years.  Abdominal aortic aneurysm (AAA) screening. You may need this if you are a current or former smoker.  Sexually transmitted disease (STD) testing. Follow these instructions at home: Eating and drinking  Eat a diet that includes fresh fruits and vegetables, whole grains, lean protein, and low-fat dairy products. Limit your intake of foods with high amounts of sugar, saturated fats, and salt.  Take vitamin and mineral supplements as recommended by your health care provider.  Do not drink alcohol if your health care provider tells you not to drink.  If you drink alcohol: ? Limit how much you have to 0-2 drinks a day. ? Be aware of how much alcohol is in your drink. In the [place] one drink equals one 12 oz bottle of beer (355 mL), one 5 oz glass of wine (148 mL), or one 1 oz glass of hard liquor (44 mL). Lifestyle  Take daily care of your teeth and gums.  Stay active. Exercise for at least 30 minutes on 5 or more days each week.  Do not use any products that contain nicotine or tobacco, such as cigarettes, e-cigarettes, and chewing tobacco. If you need help quitting, ask your health care provider.  If you are sexually active, practice safe sex. Use a condom or other form of protection to prevent STIs (sexually transmitted infections).  Talk with your health care provider about taking a low-dose aspirin or statin. What's next?  Visit your health care provider once a year for a well check visit.  Ask your health care provider how often you should have your eyes and teeth checked.  Stay up to date on all vaccines. This information is not intended to replace advice given to you by your health care provider. Make sure you discuss any questions  you have with your health care provider. Document Revised: 05/11/2018 Document Reviewed: 05/11/2018 Elsevier Patient Education  2020 [place] refers to food and lifestyle choices that are based on the traditions of countries located on the [employer]. This way of eating has been shown to help prevent certain conditions and improve outcomes for people who have chronic diseases, like kidney disease and heart disease. What are tips for following this plan? Lifestyle  Cook and eat meals together with your family, when possible.  Drink enough fluid to keep your urine clear or pale yellow.  Be physically active every day. This includes: ? Aerobic exercise like running or swimming. ? Leisure activities like gardening, walking, or housework.  Get 7-8 hours of sleep each night.  If recommended by your health care provider, drink red wine in moderation. This means 1 glass a day for nonpregnant women and 2 glasses a day for men. A glass of  wine equals 5 oz (150 mL). Reading food labels   Check the serving size of packaged foods. For foods such as rice and pasta, the serving size refers to the amount of cooked product, not dry.  Check the total fat in packaged foods. Avoid foods that have saturated fat or trans fats.  Check the ingredients list for added sugars, such as corn syrup. Shopping  At the grocery store, buy most of your food from the areas near the walls of the store. This includes: ? Fresh fruits and vegetables (produce). ? Grains, beans, nuts, and seeds. Some of these may be available in unpackaged forms or large amounts (in bulk). ? Fresh seafood. ? Poultry and eggs. ? Low-fat dairy products.  Buy whole ingredients instead of prepackaged foods.  Buy fresh fruits and vegetables in-season from local farmers markets.  Buy frozen fruits and vegetables in resealable bags.  If you do not have access to quality fresh  seafood, buy precooked frozen shrimp or canned fish, such as tuna, salmon, or sardines.  Buy small amounts of raw or cooked vegetables, salads, or olives from the deli or salad bar at your store.  Stock your pantry so you always have certain foods on hand, such as olive oil, canned tuna, canned tomatoes, rice, pasta, and beans. Cooking  Cook foods with extra-virgin olive oil instead of using butter or other vegetable oils.  Have meat as a side dish, and have vegetables or grains as your main dish. This means having meat in small portions or adding small amounts of meat to foods like pasta or stew.  Use beans or vegetables instead of meat in common dishes like chili or lasagna.  Experiment with different cooking methods. Try roasting or broiling vegetables instead of steaming or sauteing them.  Add frozen vegetables to soups, stews, pasta, or rice.  Add nuts or seeds for added healthy fat at each meal. You can add these to yogurt, salads, or vegetable dishes.  Marinate fish or vegetables using olive oil, lemon juice, garlic, and fresh herbs. Meal planning   Plan to eat 1 vegetarian meal one day each week. Try to work up to 2 vegetarian meals, if possible.  Eat seafood 2 or more times a week.  Have healthy snacks readily available, such as: ? Vegetable sticks with hummus. ? [place] yogurt. ? Fruit and nut trail mix.  Eat balanced meals throughout the week. This includes: ? Fruit: 2-3 servings a day ? Vegetables: 4-5 servings a day ? Low-fat dairy: 2 servings a day ? Fish, poultry, or lean meat: 1 serving a day ? Beans and legumes: 2 or more servings a week ? Nuts and seeds: 1-2 servings a day ? Whole grains: 6-8 servings a day ? Extra-virgin olive oil: 3-4 servings a day  Limit red meat and sweets to only a few servings a month What are my food choices?  Mediterranean diet ? Recommended  Grains: Whole-grain pasta. Brown rice. Bulgar wheat. Polenta. Couscous. Whole-wheat  bread. [place].  Vegetables: Artichokes. Beets. Broccoli. Cabbage. Carrots. Eggplant. Green beans. Chard. Kale. Spinach. Onions. Leeks. Peas. Squash. Tomatoes. Peppers. Radishes.  Fruits: Apples. Apricots. Avocado. Berries. Bananas. Cherries. Dates. Figs. Grapes. Lemons. Melon. Oranges. Peaches. Plums. Pomegranate.  Meats and other protein foods: Beans. Almonds. Sunflower seeds. Pine nuts. Peanuts. [place]. Salmon. Scallops. Shrimp. [place]. Tilapia. Clams. Oysters. Eggs.  Dairy: Low-fat milk. Cheese. Greek yogurt.  Beverages: Water. Red wine. Herbal tea.  Fats and oils: Extra virgin olive oil. Avocado oil. Grape  seed oil.  Sweets and desserts: [place] yogurt with honey. Baked apples. Poached pears. Trail mix.  Seasoning and other foods: Basil. Cilantro. Coriander. Cumin. Mint. Parsley. Sage. Rosemary. Tarragon. Garlic. Oregano. Thyme. Pepper. Balsalmic vinegar. Tahini. Hummus. Tomato sauce. Olives. Mushrooms. ? Limit these  Grains: Prepackaged pasta or rice dishes. Prepackaged cereal with added sugar.  Vegetables: Deep fried potatoes (french fries).  Fruits: Fruit canned in syrup.  Meats and other protein foods: Beef. Pork. Lamb. Poultry with skin. Hot dogs. [place] Salines.  Dairy: Ice cream. Sour cream. Whole milk.  Beverages: Juice. Sugar-sweetened soft drinks. Beer. Liquor and spirits.  Fats and oils: Butter. Canola oil. Vegetable oil. Beef fat (tallow). Lard.  Sweets and desserts: Cookies. Cakes. Pies. Candy.  Seasoning and other foods: Mayonnaise. Premade sauces and marinades. The items listed may not be a complete list. Talk with your dietitian about what dietary choices are right for you. Summary  The Mediterranean diet includes both food and lifestyle choices.  Eat a variety of fresh fruits and vegetables, beans, nuts, seeds, and whole grains.  Limit the amount of red meat and sweets that you eat.  Talk with your health care provider about whether it is safe for you to  drink red wine in moderation. This means 1 glass a day for nonpregnant women and 2 glasses a day for men. A glass of wine equals 5 oz (150 mL). This information is not intended to replace advice given to you by your health care provider. Make sure you discuss any questions you have with your health care provider. Document Revised: 01/15/2016 Document Reviewed: 01/08/2016 Elsevier Patient Education  [place].

## 2020-01-28 NOTE — Progress Notes (Signed)
Chief Complaint:  Alfred [name] is a 69 y.o. male who presents today for his annual comprehensive physical exam.    Assessment/Plan:  Chronic Problems Addressed Today: HYPERTENSION, BENIGN At goal on valsartan 160 mg daily and metoprolol 50 mg daily.  Hyperlipidemia Continue Lipitor 10 mg daily.  Check lipid panel today.  CAD (coronary artery disease) Continue management per cardiology.  On statin and aspirin.  Discussed Mediterranean diet.  Actinic keratosis Cryotherapy applied today.  See below procedure note.  Body mass index is 37.43 kg/m. / Obese  BMI Metric Follow Up - 01/28/20 0917      BMI Metric Follow Up-Please document annually   BMI Metric Follow Up Education provided           Preventative Healthcare: Due for colonoscopy later this year.  Has received 2 doses of Covid vaccine.  Will be due for booster in a few months.  Check CBC, CMET, TSH, lipid panel, A1c.   Patient Counseling(The following topics were reviewed and/or handout was given):  -Nutrition: Stressed importance of moderation in sodium/caffeine intake, saturated fat and cholesterol, caloric balance, sufficient intake of fresh fruits, vegetables, and fiber.  -Stressed the importance of regular exercise.   -Substance Abuse: Discussed cessation/primary prevention of tobacco, alcohol, or other drug use; driving or other dangerous activities under the influence; availability of treatment for abuse.   -Injury prevention: Discussed safety belts, safety helmets, smoke detector, smoking near bedding or upholstery.   -Sexuality: Discussed sexually transmitted diseases, partner selection, use of condoms, avoidance of unintended pregnancy and contraceptive alternatives.   -Dental health: Discussed importance of regular tooth brushing, flossing, and dental visits.  -Health maintenance and immunizations reviewed. Please refer to Health maintenance section.  Return to care in 1 year for next preventative visit.      Subjective:  HPI:  He has no acute complaints today.   He has a few actinic keratoses he would like frozen today.   Lifestyle Diet: Cutting out on carbs.  Exercise: Busy with work.   Depression screen PHQ 2/9 04/30/2019  Decreased Interest 0  Down, Depressed, Hopeless 0  PHQ - 2 Score 0    Health Maintenance Due  Topic Date Due   Hepatitis C Screening  Never done   TETANUS/TDAP  Never done   PNA vac Low Risk Adult (2 of 2 - PPSV23) 01/26/2019     ROS: Per HPI, otherwise a complete review of systems was negative.   PMH:  The following were reviewed and entered/updated in epic: Past Medical History:  Diagnosis Date   AAA (abdominal aortic aneurysm) ([place]) 08/2011   see Surgical history   Anginal pain ([place])    Arthritis    "knees; not bad"   CAD (coronary artery disease)    DES to RCA and CFX; Dr. Burt Knack cardiologist   Depression ~ 2009   'treated for ~ 6 months"   Dyslipidemia    HTN (hypertension)    unspecified essential   Hx of adenomatous colonic polyps 2008   Hyperplasia of prostate without lower urinary tract symptoms (LUTS)    Left lumbar radiculopathy    Other and unspecified hyperlipidemia    Stroke [facility])    Patient Active Problem List   Diagnosis Date Noted   Actinic keratosis 01/28/2020   CAD (coronary artery disease)    HYPERTENSION, BENIGN 05/02/2009   History of AAA (abdominal aortic aneurysm) repair 11/05/2008   COLONIC POLYPS, ADENOMATOUS 03/28/2008   Hyperlipidemia 03/28/2008   LUMBAR RADICULOPATHY, LEFT  03/28/2008   Past Surgical History:  Procedure Laterality Date   ABDOMINAL AORTIC ANEURYSM REPAIR  09/06/2011   ABDOMINAL AORTIC ANEURYSM REPAIR  01/21/2012   Procedure: ANEURYSM ABDOMINAL AORTIC REPAIR;  Surgeon: Elam Dutch, MD;  Location: [facility];  Service: Vascular;  Laterality: N/A;  Aortio- bi-iliac repair  using Cryolife descending thoracic aorta 58mm x10.5 cm.   APPENDECTOMY  1970   ARTERY REPAIR   09/06/2011   Procedure: ARTERY REPAIR;  Surgeon: Elam Dutch, MD;  Location: [facility] OR;  Service: Vascular;  Laterality: Bilateral;   COLONOSCOPY W/ POLYPECTOMY  2008   adenomatous  & hyperplastic polyp;due 2013   CORONARY ANGIOPLASTY WITH STENT PLACEMENT  2002; 2005   "2 +1; total of 3"   CYSTOSCOPY  2005   varicose veins in bladder, Dr Diona Fanti   TONSILLECTOMY AND ADENOIDECTOMY  1954   VASECTOMY      Family History  Problem Relation Age of Onset   Coronary artery disease Father        CABG in 59s   Diabetes Father    Colon polyps Father    Lung cancer Father        non-small cell carcinoma/bone CA   Hypertension Father    Coronary artery disease Maternal Grandfather    Coronary artery disease Paternal Grandfather    Other Mother        varicose veins   Anesthesia problems Neg Hx    Stroke Neg Hx     Medications- reviewed and updated Current Outpatient Medications  Medication Sig Dispense Refill   acetaminophen (TYLENOL) 500 MG tablet Take 500 mg by mouth every 6 (six) hours as needed for mild pain. PRN for pain/fever     aspirin 81 MG tablet Take 81 mg by mouth at bedtime.      atorvastatin (LIPITOR) 10 MG tablet TAKE 1 TABLET BY MOUTH DAILY AT 6 PM. Pt needs appointment for refills. Please call [phone]. 90 tablet 0   clopidogrel (PLAVIX) 75 MG tablet Take 1 tablet (75 mg total) by mouth daily. Please make overdue appt with Dr. Burt Knack before anymore refills. 1st attempt 30 tablet 0   famotidine (PEPCID) 40 MG tablet Take 1 tablet by mouth daily.      metoprolol succinate (TOPROL-XL) 50 MG 24 hr tablet TAKE 1 TABLET (50 MG TOTAL) BY MOUTH DAILY. TAKE WITH OR IMMEDIATELY FOLLOWING A MEAL. 90 tablet 3   valsartan (DIOVAN) 160 MG tablet TAKE 1 TABLET BY MOUTH EVERY DAY 90 tablet 2   No current facility-administered medications for this visit.    Allergies-reviewed and updated No Known Allergies  Social History   Socioeconomic History    Marital status: Married    Spouse name: Not on file   Number of children: Not on file   Years of education: Not on file   Highest education level: Not on file  Occupational History   Occupation: Farming  Tobacco Use   Smoking status: Former Smoker    Packs/day: 1.00    Years: 27.00    Pack years: 27.00    Types: Cigarettes    Quit date: 05/31/1993    Years since quitting: 26.6   Smokeless tobacco: Current User    Types: Snuff   Tobacco comment: smoked 1968-1995, up to 1 ppd. Currently using [place] 1 can / day  Vaping Use   Vaping Use: Never used  Substance and Sexual Activity   Alcohol use: Yes    Alcohol/week: 21.0 standard drinks  Types: 21 Shots of liquor per week    Comment: 2-3 drinks of Vodka a night, occasional beer   Drug use: No   Sexual activity: Yes  Other Topics Concern   Not on file  Social History Narrative   Not on file   Social Determinants of Health   Financial Resource Strain:    Difficulty of Paying Living Expenses: Not on file  Food Insecurity: No Food Insecurity   Worried About Running Out of Food in the Last Year: Never true   Ran Out of Food in the Last Year: Never true  Transportation Needs: No Transportation Needs   Lack of Transportation (Medical): No   Lack of Transportation (Non-Medical): No  Physical Activity:    Days of Exercise per Week: Not on file   Minutes of Exercise per Session: Not on file  Stress:    Feeling of Stress : Not on file  Social Connections:    Frequency of Communication with Friends and Family: Not on file   Frequency of Social Gatherings with Friends and Family: Not on file   Attends Religious Services: Not on file   Active Member of Clubs or Organizations: Not on file   Attends Archivist Meetings: Not on file   Marital Status: Not on file        Objective:  Physical Exam: BP 123/85    Pulse (!) 58    Temp 98.2 F (36.8 C) (Temporal)    Ht 6' (1.829 m)    Wt 276 lb  (125.2 kg)    SpO2 100%    BMI 37.43 kg/m   Body mass index is 37.43 kg/m. Wt Readings from Last 3 Encounters:  01/28/20 276 lb (125.2 kg)  01/25/19 (!) 313 lb 3.2 oz (142.1 kg)  01/09/19 (!) 310 lb (140.6 kg)  Gen: NAD, resting comfortably HEENT: TMs normal bilaterally. OP clear. No thyromegaly noted.  CV: RRR with no murmurs appreciated Pulm: NWOB, CTAB with no crackles, wheezes, or rhonchi GI: Normal bowel sounds present. Soft, Nontender, Nondistended. MSK: no edema, cyanosis, or clubbing noted Skin: warm, dry. Several AKs scattered on arms and face.  Neuro: CN2-12 grossly intact. Strength 5/5 in upper and lower extremities. Reflexes symmetric and intact bilaterally.  Psych: Normal affect and thought content  Cryotherapy Procedure Note  Pre-operative Diagnosis: Actinic keratosis  Locations: Arms, scalp  Indications: Therapeutic  Procedure Details  Patient informed of risks (permanent scarring, infection, light or dark discoloration, bleeding, infection, weakness, numbness and recurrence of the lesion) and benefits of the procedure and verbal informed consent obtained.  The areas above are treated with liquid nitrogen therapy, frozen until ice ball extended 3 mm beyond lesion. A total of 8 lesions were treated. The patient tolerated procedure well.  The patient was instructed on post-op care, warned that there may be blister formation, redness and pain. Recommend OTC analgesia as needed for pain.  Condition: Stable  Complications: none.       Algis Greenhouse. Jerline Pain, MD 01/28/2020 9:17 AM

## 2020-01-28 NOTE — Assessment & Plan Note (Signed)
Continue management per cardiology.  On statin and aspirin.  Discussed Mediterranean diet.

## 2020-01-29 LAB — LIPID PANEL
Cholesterol: 140 mg/dL (ref <200)
HDL: 54 mg/dL (ref >=40)
LDL Cholesterol (Calc): 70 mg/dL (calc)
Non-HDL Cholesterol (Calc): 86 mg/dL (calc) (ref <130)
Total CHOL/HDL Ratio: 2.6 (calc) (ref <5.0)
Triglycerides: 79 mg/dL (ref <150)

## 2020-01-29 LAB — COMPREHENSIVE METABOLIC PANEL
AG Ratio: 1.6 (calc) (ref 1.0–2.5)
ALT: 46 U/L (ref 9–46)
AST: 38 U/L — ABNORMAL HIGH (ref 10–35)
Albumin: 4.2 g/dL (ref 3.6–5.1)
Alkaline phosphatase (APISO): 85 U/L (ref 35–144)
BUN: 23 mg/dL (ref 7–25)
CO2: 29 mmol/L (ref 20–32)
Calcium: 9.2 mg/dL (ref 8.6–10.3)
Chloride: 102 mmol/L (ref 98–110)
Creat: 0.83 mg/dL (ref 0.70–1.25)
Globulin: 2.6 g/dL (calc) (ref 1.9–3.7)
Glucose, Bld: 106 mg/dL — ABNORMAL HIGH (ref 65–99)
Potassium: 4.4 mmol/L (ref 3.5–5.3)
Sodium: 138 mmol/L (ref 135–146)
Total Bilirubin: 0.8 mg/dL (ref 0.2–1.2)
Total Protein: 6.8 g/dL (ref 6.1–8.1)

## 2020-01-29 LAB — CBC
HCT: 45.7 % (ref 38.5–50.0)
Hemoglobin: 15.8 g/dL (ref 13.2–17.1)
MCH: 34.6 pg — ABNORMAL HIGH (ref 27.0–33.0)
MCHC: 34.6 g/dL (ref 32.0–36.0)
MCV: 100 fL (ref 80.0–100.0)
MPV: 11.2 fL (ref 7.5–12.5)
Platelets: 139 10*3/uL — ABNORMAL LOW (ref 140–400)
RBC: 4.57 10*6/uL (ref 4.20–5.80)
RDW: 12.2 % (ref 11.0–15.0)
WBC: 4.2 10*3/uL (ref 3.8–10.8)

## 2020-01-29 LAB — HEMOGLOBIN A1C
Hgb A1c MFr Bld: 5.1 % of total Hgb (ref <5.7)
Mean Plasma Glucose: 100 (calc)
eAG (mmol/L): 5.5 (calc)

## 2020-01-29 LAB — PSA: PSA: 0.1 ng/mL (ref <=4.0)

## 2020-01-29 LAB — TSH: TSH: 2.23 mIU/L (ref 0.40–4.50)

## 2020-01-29 NOTE — Progress Notes (Signed)
Please inform patient of the following:  Good news! Blood work is all STABLE. Would like for him to keep up the good work and we can recheck in a year.  Alfred [name]. Jerline Pain, MD 01/29/2020 9:32 AM

## 2020-02-03 ENCOUNTER — Other Ambulatory Visit: Payer: Self-pay | Admitting: Cardiovascular Disease

## 2020-02-12 ENCOUNTER — Other Ambulatory Visit: Payer: Self-pay

## 2020-02-12 ENCOUNTER — Encounter: Payer: Self-pay | Admitting: Cardiovascular Disease

## 2020-02-12 ENCOUNTER — Ambulatory Visit: Payer: PPO | Admitting: Cardiovascular Disease

## 2020-02-12 VITALS — BP 126/78 | HR 69 | Ht 72.0 in | Wt 273.2 lb

## 2020-02-12 DIAGNOSIS — I714 Abdominal aortic aneurysm, without rupture, unspecified: Secondary | ICD-10-CM

## 2020-02-12 DIAGNOSIS — E782 Mixed hyperlipidemia: Secondary | ICD-10-CM | POA: Diagnosis not present

## 2020-02-12 DIAGNOSIS — I10 Essential (primary) hypertension: Secondary | ICD-10-CM | POA: Diagnosis not present

## 2020-02-12 DIAGNOSIS — I2510 Atherosclerotic heart disease of native coronary artery without angina pectoris: Secondary | ICD-10-CM

## 2020-02-12 NOTE — Progress Notes (Signed)
Cardiology Office Note:    Date:  02/12/2020   ID:  Alfred [name], DOB 25-Jun-1950, MRN [id number]  PCP:  Vivi Barrack, MD  [facility] Cardiologist:  Sherren Mocha, MD  [place] Electrophysiologist:  None   Referring MD: Vivi Barrack, MD   Chief Complaint  Patient presents with  . Coronary Artery Disease    History of Present Illness:    Alfred [name] is a 69 y.o. male with a hx of:  Coronary artery disease  ? Hx of Cypher DES to RCA, PDA and LCx in 09/2003  Hypertension   Hyperlipidemia   Abdominal aortic aneurysm  ? S/p Repair in 2013  BPH  Hx of CVA  Hepatic steatosis (hx of elevated LFTs)  The patient is here alone today. He has lost a lot of weight on the Keto diet.  He has just returned from his son's wedding in [place].  He denies any cardiac-related problems at present.  He specifically denies chest pain, chest pressure, shortness of breath, edema, orthopnea, PND, or heart palpitations.  Past Medical History:  Diagnosis Date  . AAA (abdominal aortic aneurysm) ([place]) 08/2011   see Surgical history  . Anginal pain ([place])   . Arthritis    "knees; not bad"  . CAD (coronary artery disease)    DES to RCA and CFX; Dr. Burt Knack cardiologist  . Depression ~ 2009   'treated for ~ 6 months"  . Dyslipidemia   . HTN (hypertension)    unspecified essential  . Hx of adenomatous colonic polyps 2008  . Hyperplasia of prostate without lower urinary tract symptoms (LUTS)   . Left lumbar radiculopathy   . Other and unspecified hyperlipidemia   . Stroke [facility])     Past Surgical History:  Procedure Laterality Date  . ABDOMINAL AORTIC ANEURYSM REPAIR  09/06/2011  . ABDOMINAL AORTIC ANEURYSM REPAIR  01/21/2012   Procedure: ANEURYSM ABDOMINAL AORTIC REPAIR;  Surgeon: Elam Dutch, MD;  Location: [place];  Service: Vascular;  Laterality: N/A;  Aortio- bi-iliac repair  using Cryolife descending thoracic aorta 76mm x10.5 cm.  . APPENDECTOMY  1970  . ARTERY  REPAIR  09/06/2011   Procedure: ARTERY REPAIR;  Surgeon: Elam Dutch, MD;  Location: [place];  Service: Vascular;  Laterality: Bilateral;  . COLONOSCOPY W/ POLYPECTOMY  2008   adenomatous  & hyperplastic polyp;due 2013  . CORONARY ANGIOPLASTY WITH STENT PLACEMENT  2002; 2005   "2 +1; total of 3"  . CYSTOSCOPY  2005   varicose veins in bladder, Dr Diona Fanti  . [place]  . VASECTOMY      Current Medications: Current Meds  Medication Sig  . acetaminophen (TYLENOL) 500 MG tablet Take 500 mg by mouth every 6 (six) hours as needed for mild pain. PRN for pain/fever  . aspirin 81 MG tablet Take 81 mg by mouth at bedtime.   Marland Kitchen atorvastatin (LIPITOR) 10 MG tablet TAKE 1 TABLET BY MOUTH DAILY AT 6 PM. Pt needs appointment for refills. Please call [phone].  . clopidogrel (PLAVIX) 75 MG tablet Take 1 tablet (75 mg total) by mouth daily. Please keep upcoming appt in January with Dr. Burt Knack before anymore refills. Thank you  . famotidine (PEPCID) 40 MG tablet Take 1 tablet by mouth daily as needed for heartburn or indigestion.   . metoprolol succinate (TOPROL-XL) 50 MG 24 hr tablet TAKE 1 TABLET (50 MG TOTAL) BY MOUTH DAILY. TAKE WITH OR IMMEDIATELY FOLLOWING A MEAL.  Marland Kitchen  valsartan (DIOVAN) 160 MG tablet TAKE 1 TABLET BY MOUTH EVERY DAY     Allergies:   Patient has no known allergies.   Social History   Socioeconomic History  . Marital status: Married    Spouse name: Not on file  . Number of children: Not on file  . Years of education: Not on file  . Highest education level: Not on file  Occupational History  . Occupation: Farming  Tobacco Use  . Smoking status: Former Smoker    Packs/day: 1.00    Years: 27.00    Pack years: 27.00    Types: Cigarettes    Quit date: 05/31/1993    Years since quitting: 26.7  . Smokeless tobacco: Current User    Types: Snuff  . Tobacco comment: smoked 1968-1995, up to 1 ppd. Currently using Skoal 1 can / day  Vaping Use  .  Vaping Use: Never used  Substance and Sexual Activity  . Alcohol use: Yes    Alcohol/week: 21.0 standard drinks    Types: 21 Shots of liquor per week    Comment: 2-3 drinks of Vodka a night, occasional beer  . Drug use: No  . Sexual activity: Yes  Other Topics Concern  . Not on file  Social History Narrative  . Not on file   Social Determinants of Health   Financial Resource Strain:   . Difficulty of Paying Living Expenses: Not on file  Food Insecurity: No Food Insecurity  . Worried About Charity fundraiser in the Last Year: Never true  . Ran Out of Food in the Last Year: Never true  Transportation Needs: No Transportation Needs  . Lack of Transportation (Medical): No  . Lack of Transportation (Non-Medical): No  Physical Activity:   . Days of Exercise per Week: Not on file  . Minutes of Exercise per Session: Not on file  Stress:   . Feeling of Stress : Not on file  Social Connections:   . Frequency of Communication with Friends and Family: Not on file  . Frequency of Social Gatherings with Friends and Family: Not on file  . Attends Religious Services: Not on file  . Active Member of Clubs or Organizations: Not on file  . Attends Archivist Meetings: Not on file  . Marital Status: Not on file     Family History: The patient's family history includes Colon polyps in his father; Coronary artery disease in his father, maternal grandfather, and paternal grandfather; Diabetes in his father; Hypertension in his father; Lung cancer in his father; Other in his mother. There is no history of Anesthesia problems or Stroke.  ROS:   Please see the history of present illness.    All other systems reviewed and are negative.  EKGs/Labs/Other Studies Reviewed:    The following studies were reviewed today: Exercise Tolerance Test 04-06-2019: Study Highlights    Blood pressure demonstrated a hypertensive response to exercise.  There was no ST segment deviation noted  during stress.   Patient only achieved 89% of PMHR Negative ETT for ischemic ST changes HTN response to exercise No significant arrhythmia   EKG:  EKG is not ordered today.   Recent Labs: 01/28/2020: ALT 46; BUN 23; Creat 0.83; Hemoglobin 15.8; Platelets 139; Potassium 4.4; Sodium 138; TSH 2.23  Recent Lipid Panel    Component Value Date/Time   CHOL 140 01/28/2020 0913   CHOL 134 01/09/2019 0956   TRIG 79 01/28/2020 0913   TRIG 110 05/26/2006 1118  HDL 54 01/28/2020 0913   HDL 50 01/09/2019 0956   CHOLHDL 2.6 01/28/2020 0913   VLDL 16 04/14/2015 0952   LDLCALC 70 01/28/2020 0913   LDLDIRECT 78.1 01/15/2013 0747    Physical Exam:    VS:  BP 126/78   Pulse 69   Ht 6' (1.829 m)   Wt 273 lb 3.2 oz (123.9 kg)   SpO2 97%   BMI 37.05 kg/m     Wt Readings from Last 3 Encounters:  02/12/20 273 lb 3.2 oz (123.9 kg)  01/28/20 276 lb (125.2 kg)  01/25/19 (!) 313 lb 3.2 oz (142.1 kg)     GEN:  Well nourished, well developed in no acute distress HEENT: Normal NECK: No JVD; No carotid bruits LYMPHATICS: No lymphadenopathy CARDIAC: RRR, no murmurs, rubs, gallops RESPIRATORY:  Clear to auscultation without rales, wheezing or rhonchi  ABDOMEN: Soft, non-tender, non-distended MUSCULOSKELETAL:  No edema; No deformity  SKIN: Warm and dry NEUROLOGIC:  Alert and oriented x 3 PSYCHIATRIC:  Normal affect   ASSESSMENT:    1. Coronary artery disease involving native coronary artery of native heart without angina pectoris   2. Mixed hyperlipidemia   3. Essential hypertension   4. AAA (abdominal aortic aneurysm) without rupture (HCC)    PLAN:    In order of problems listed above:  1. Doing very well. Continue ASA, clopidogrel, atorvastatin, and metoprolol. No angina on his current regimen.  2. Weight loss as outlined. LDL cholesterol 70 mg/dL. Continue atorvastatin. LFT's have been mildly elevated - stable on most recent labs with ALT 46 mg/dL.  3. BP well controlled. Weight  loss has helped. On valsartan and metoprolol succinate.  4. Scheduled for a 5-year follow-up and CTA study with Dr. Oneida Alar next year.  Blood pressure under ideal control.  Continue current therapy.   Medication Adjustments/Labs and Tests Ordered: Current medicines are reviewed at length with the patient today.  Concerns regarding medicines are outlined above.  No orders of the defined types were placed in this encounter.  No orders of the defined types were placed in this encounter.   Patient Instructions  Medication Instructions:  Your provider recommends that you continue on your current medications as directed. Please refer to the Current Medication list given to you today.   *If you need a refill on your cardiac medications before your next appointment, please call your pharmacy*  Testing/Procedures: You will have a treadmill study prior to your next visit. Let us know if your DOT requirements are due prior to your 1 year.  Follow-Up: At [facility], you and your health needs are our priority.  As part of our continuing mission to provide you with exceptional heart care, we have created designated Provider Care Teams.  These Care Teams include your primary Cardiologist (physician) and Advanced Practice Providers (APPs -  Physician Assistants and Nurse Practitioners) who all work together to provide you with the care you need, when you need it. Your next appointment:   12 month(s) The format for your next appointment:   In Person Provider:   You may see Sherren Mocha, MD or one of the following Advanced Practice Providers on your designated Care Team:    Richardson Dopp, PA-C  Robbie Lis, [place]      Signed, Sherren Mocha, MD  02/12/2020 2:52 PM    [place]

## 2020-02-12 NOTE — Patient Instructions (Signed)
Medication Instructions:  Your provider recommends that you continue on your current medications as directed. Please refer to the Current Medication list given to you today.   *If you need a refill on your cardiac medications before your next appointment, please call your pharmacy*  Testing/Procedures: You will have a treadmill study prior to your next visit. Let us know if your DOT requirements are due prior to your 1 year.  Follow-Up: At [facility], you and your health needs are our priority.  As part of our continuing mission to provide you with exceptional heart care, we have created designated Provider Care Teams.  These Care Teams include your primary Cardiologist (physician) and Advanced Practice Providers (APPs -  Physician Assistants and Nurse Practitioners) who all work together to provide you with the care you need, when you need it. Your next appointment:   12 month(s) The format for your next appointment:   In Person Provider:   You may see Sherren Mocha, MD or one of the following Advanced Practice Providers on your designated Care Team:    Richardson Dopp, PA-C  Vin Buhl, [place]

## 2020-02-13 ENCOUNTER — Telehealth: Payer: Self-pay | Admitting: Cardiovascular Disease

## 2020-02-13 NOTE — Telephone Encounter (Signed)
The patient states his DOT requirement is due 05/17/2021, so he requests appointments for GXT and visit with Dr. Burt Knack the last week of November or first week of December. He understands he will be called to arrange when the schedule is available. He was grateful for assistance.

## 2020-02-13 NOTE — Telephone Encounter (Signed)
Alfred [name] is calling requesting to speak with Alfred [name] he states he was supposed to call her back today in regards to scheduling his Stress Test. Please advise.

## 2020-02-14 ENCOUNTER — Telehealth: Payer: Self-pay

## 2020-02-14 NOTE — Progress Notes (Signed)
Chronic Care Management Pharmacy Assistant   Name: Alfred [name]  MRN: [id number] DOB: [birth year]  Reason for Encounter: Disease State    PCP : Vivi Barrack, MD  Allergies:  No Known Allergies  Medications: Outpatient Encounter Medications as of 02/14/2020  Medication Sig  . acetaminophen (TYLENOL) 500 MG tablet Take 500 mg by mouth every 6 (six) hours as needed for mild pain. PRN for pain/fever  . aspirin 81 MG tablet Take 81 mg by mouth at bedtime.   Marland Kitchen atorvastatin (LIPITOR) 10 MG tablet TAKE 1 TABLET BY MOUTH DAILY AT 6 PM. Pt needs appointment for refills. Please call [phone].  . clopidogrel (PLAVIX) 75 MG tablet Take 1 tablet (75 mg total) by mouth daily. Please keep upcoming appt in January with Dr. Burt Knack before anymore refills. Thank you  . famotidine (PEPCID) 40 MG tablet Take 1 tablet by mouth daily as needed for heartburn or indigestion.   . metoprolol succinate (TOPROL-XL) 50 MG 24 hr tablet TAKE 1 TABLET (50 MG TOTAL) BY MOUTH DAILY. TAKE WITH OR IMMEDIATELY FOLLOWING A MEAL.  . valsartan (DIOVAN) 160 MG tablet TAKE 1 TABLET BY MOUTH EVERY DAY   No facility-administered encounter medications on file as of 02/14/2020.    Current Diagnosis: Patient Active Problem List   Diagnosis Date Noted  . Actinic keratosis 01/28/2020  . CAD (coronary artery disease)   . HYPERTENSION, BENIGN 05/02/2009  . History of AAA (abdominal aortic aneurysm) repair 11/05/2008  . COLONIC POLYPS, ADENOMATOUS 03/28/2008  . Hyperlipidemia 03/28/2008  . LUMBAR RADICULOPATHY, LEFT 03/28/2008    Reviewed chart prior to disease state call. Spoke with patient regarding BP  Recent Office Vitals: BP Readings from Last 3 Encounters:  02/12/20 126/78  01/28/20 123/85  01/25/19 137/82   Pulse Readings from Last 3 Encounters:  02/12/20 69  01/28/20 (!) 58  01/25/19 63    Wt Readings from Last 3 Encounters:  02/12/20 273 lb 3.2 oz (123.9 kg)  01/28/20 276 lb (125.2 kg)  01/25/19  (!) 313 lb 3.2 oz (142.1 kg)     Kidney Function Lab Results  Component Value Date/Time   CREATININE 0.83 01/28/2020 09:13 AM   CREATININE 1.07 01/23/2019 02:32 PM   CREATININE 1.02 01/09/2019 09:56 AM   CREATININE 1.07 01/07/2016 08:14 AM   GFR 66.92 03/29/2014 08:26 AM   GFRNONAA 71 01/23/2019 02:32 PM   GFRAA 83 01/23/2019 02:32 PM    BMP Latest Ref Rng & Units 01/28/2020 01/23/2019 01/09/2019  Glucose 65 - 99 mg/dL 106(H) 93 106(H)  BUN 7 - 25 mg/dL 23 22 17   Creatinine 0.70 - 1.25 mg/dL 0.83 1.07 1.02  BUN/Creat Ratio 6 - 22 (calc) NOT APPLICABLE 21 17  Sodium 135 - 146 mmol/L 138 141 137  Potassium 3.5 - 5.3 mmol/L 4.4 4.4 4.8  Chloride 98 - 110 mmol/L 102 103 100  CO2 20 - 32 mmol/L 29 24 23   Calcium 8.6 - 10.3 mg/dL 9.2 9.4 9.4    . Current antihypertensive regimen:   Atorvastatin 10mg , Metoprolol Succinate 50mg , Valsartan 160mg . Marland Kitchen How often are you checking your Blood Pressure? Patient does not check blood pressure . Current home BP readings: N/A . What recent interventions/DTPs have been made by any provider to improve Blood Pressure control since last CPP Visit: None noted . Any recent hospitalizations or ED visits since last visit with CPP? No . What diet changes have been made to improve Blood Pressure Control?   Patient states he is  on a Keto diet, little to no carbs. . What exercise is being done to improve your Blood Pressure Control?  Patient states he continues to work and remains active as much as possible but doesn't over do it.   Adherence Review: Is the patient currently on ACE/ARB medication? Yes Does the patient have >5 day gap between last estimated fill dates? No   Raynelle Highland, [place] Pharmicist Assistant [phone]      Follow-Up:  Pharmacist Review

## 2020-02-19 ENCOUNTER — Telehealth: Payer: Self-pay

## 2020-03-28 NOTE — Progress Notes (Cosign Needed)
Open in Error.

## 2020-04-03 ENCOUNTER — Other Ambulatory Visit: Payer: Self-pay | Admitting: Cardiovascular Disease

## 2020-04-14 ENCOUNTER — Ambulatory Visit: Payer: PPO

## 2020-04-14 DIAGNOSIS — E785 Hyperlipidemia, unspecified: Secondary | ICD-10-CM

## 2020-04-14 DIAGNOSIS — I10 Essential (primary) hypertension: Secondary | ICD-10-CM

## 2020-04-14 NOTE — Progress Notes (Signed)
Chronic Care Management Pharm[facility]  Name: Alfred [name]  MRN: [id number] DOB: 1951-01-01  Chief Complaint/ HPI  [name],  69 y.o. , male presents for their Follow-Up CCM visit with the clinical pharmacist via telephone due to COVID-19 Pandemic.  PCP : Vivi Barrack, MD  Chronic conditions include:  Encounter Diagnoses  Name Primary?  . Hyperlipidemia, unspecified hyperlipidemia type Yes  . HYPERTENSION, BENIGN     Patient Active Problem List   Diagnosis Date Noted  . Actinic keratosis 01/28/2020  . CAD (coronary artery disease)   . HYPERTENSION, BENIGN 05/02/2009  . History of AAA (abdominal aortic aneurysm) repair 11/05/2008  . COLONIC POLYPS, ADENOMATOUS 03/28/2008  . Hyperlipidemia 03/28/2008  . LUMBAR RADICULOPATHY, LEFT 03/28/2008   Past Surgical History:  Procedure Laterality Date  . ABDOMINAL AORTIC ANEURYSM REPAIR  09/06/2011  . ABDOMINAL AORTIC ANEURYSM REPAIR  01/21/2012   Procedure: ANEURYSM ABDOMINAL AORTIC REPAIR;  Surgeon: Elam Dutch, MD;  Location: [facility];  Service: Vascular;  Laterality: N/A;  Aortio- bi-iliac repair  using Cryolife descending thoracic aorta 58m x10.5 cm.  . APPENDECTOMY  1970  . ARTERY REPAIR  09/06/2011   Procedure: ARTERY REPAIR;  Surgeon: CElam Dutch MD;  [facility]  Service: Vascular;  Laterality: Bilateral;  . COLONOSCOPY W/ POLYPECTOMY  2008   adenomatous  & hyperplastic polyp;due 2013  . CORONARY ANGIOPLASTY WITH STENT PLACEMENT  2002; 2005   "2 +1; total of 3"  . CYSTOSCOPY  2005   varicose veins in bladder, Dr [place] . V[place]CTOMY     Social History   Socioeconomic History  . Marital status: Married    Spouse name: Not on file  . Number of children: Not on file  . Years of education: Not on file  . Highest education level: Not on file  Occupational History  . Occupation: Farming  Tobacco Use  . Smoking status: Former Smoker    Packs/day: 1.00    Years:  27.00    Pack years: 27.00    Types: Cigarettes    Quit date: 05/31/1993    Years since quitting: 26.8  . Smokeless tobacco: Current User    Types: Snuff  . Tobacco comment: smoked 1968-1995, up to 1 ppd. Currently using Skoal 1 can / day  Vaping Use  . Vaping Use: Never used  Substance and Sexual Activity  . Alcohol use: Yes    Alcohol/week: 21.0 standard drinks    Types: 21 Shots of liquor per week    Comment: 2-3 drinks of Vodka a night, occasional beer  . Drug use: No  . Sexual activity: Yes  Other Topics Concern  . Not on file  Social History Narrative  . Not on file   Social Determinants of Health   Financial Resource Strain:   . Difficulty of Paying Living Expenses: Not on file  Food Insecurity: No Food Insecurity  . Worried About RCharity fundraiserin the Last Year: Never true  . Ran Out of Food in the Last Year: Never true  Transportation Needs: No Transportation Needs  . Lack of Transportation (Medical): No  . Lack of Transportation (Non-Medical): No  Physical Activity:   . Days of Exercise per Week: Not on file  . Minutes of Exercise per Session: Not on file  Stress:   . Feeling of Stress : Not on file  Social Connections:   . Frequency of Communication with Friends and  Family: Not on file  . Frequency of Social Gatherings with Friends and Family: Not on file  . Attends Religious Services: Not on file  . Active Member of Clubs or Organizations: Not on file  . Attends Archivist Meetings: Not on file  . Marital Status: Not on file   Family History  Problem Relation Age of Onset  . Coronary artery disease Father        CABG in 52s  . Diabetes Father   . Colon polyps Father   . Lung cancer Father        non-small cell carcinoma/bone CA  . Hypertension Father   . Coronary artery disease Maternal Grandfather   . Coronary artery disease Paternal Grandfather   . Other Mother        varicose veins  . Anesthesia problems Neg Hx   . Stroke Neg Hx      No Known Allergies Outpatient Encounter Medications as of 04/14/2020  Medication Sig  . acetaminophen (TYLENOL) 500 MG tablet Take 500 mg by mouth every 6 (six) hours as needed for mild pain. PRN for pain/fever  . aspirin 81 MG tablet Take 81 mg by mouth at bedtime.   Marland Kitchen atorvastatin (LIPITOR) 10 MG tablet TAKE 1 TABLET BY MOUTH DAILY AT 6 PM. PT NEEDS APPOINTMENT FOR REFILLS. PLEASE CALL [phone].  . clopidogrel (PLAVIX) 75 MG tablet Take 1 tablet (75 mg total) by mouth daily. Please keep upcoming appt in January with Dr. Burt Knack before anymore refills. Thank you  . famotidine (PEPCID) 40 MG tablet Take 1 tablet by mouth daily as needed for heartburn or indigestion.   . metoprolol succinate (TOPROL-XL) 50 MG 24 hr tablet TAKE 1 TABLET (50 MG TOTAL) BY MOUTH DAILY. TAKE WITH OR IMMEDIATELY FOLLOWING A MEAL.  . valsartan (DIOVAN) 160 MG tablet TAKE 1 TABLET BY MOUTH EVERY DAY   No facility-administered encounter medications on file as of 04/14/2020.   Patient Care Team    Relationship Specialty Notifications Start End  Vivi Barrack, MD PCP - General Family Medicine  01/25/18   Sherren Mocha, MD PCP - Cardiology Cardiology Admissions 02/21/18   [name], [facility] Pharmacist Pharmacist  12/28/19    Comment: [phone]   Current Diagnosis/Assessment: Goals Addressed            This Visit's Progress   . Patient Stated   On track    [place] (see longitudinal plan of care for additional care plan information)  Current Barriers:  . Chronic Disease Management support, education, and care coordination needs related to Hypertension, Hyperlipidemia, and Coronary Artery Disease   Hypertension BP Readings from Last 3 Encounters:  01/25/19 137/82  01/23/19 140/88  01/09/19 (!) 145/89   . Pharmacist Clinical Goal(s): o Over the next 180 days, patient will work with PharmD and providers to achieve BP goal <130/80 . Current regimen:  . Metoprolol succinate 50 mg once daily   . Valsartan 160 mg once daily  . Interventions: o We reviewed home BP monitoring due to pt concern of possible over treatment - purchase XL cuff for upper arm - [facility] that [facility] do [facility] this [facility]. Reviewed recommendations to increase water intake/improve hydration. . Patient self care activities - Over the next 180 days, patient will: o Check BP at least once every 1-2 weeks, document, and provide at future appointments o Ensure daily salt intake < 2300 mg/day  Hyperlipidemia Lab Results  Component Value Date/Time  [place] 66 01/09/2019 09:56 AM   LDLDIRECT 78.1 01/15/2013 07:47 AM   . Pharmacist Clinical Goal(s): o Over the next 180 days, patient will work with PharmD and providers to maintain LDL goal < 70 . Current regimen:  o Atorvastatin 10 mg daily . Interventions: o Continue current management . Patient self care activities - Over the next 180 days, patient will: o Continue current management  Coronary Artery Disease . Pharmacist Clinical Goal(s) o Over the next 180 days, patient will work with PharmD and providers to ensure medication safety.  . Current regimen:  o Clopidogrel 75 mg once daily o Aspirin 81 mg once daily . Interventions: o Continue current management o Consider recommendation for high intensity statin . Patient self care activities - Over the next 180 days, patient will: o Continue current management  Medication management . Pharmacist Clinical Goal(s): o Over the next 180 days, patient will work with PharmD and providers to achieve optimal medication adherence . [facility] . Interventions o Comprehensive medication review performed. o Continue current medication management strategy . Patient self care activities - Over the next 180 days, patient will: o Take medications as prescribed o Report any questions or concerns to PharmD and/or provider(s) Initial goal documentation.      Hypertension   BP goal  <130/80  BP Readings from Last 3 Encounters:  02/12/20 126/78  01/28/20 123/85  01/25/19 137/82   BMP Latest Ref Rng & Units 01/28/2020 01/23/2019 01/09/2019  Glucose 65 - 99 mg/dL 106(H) 93 106(H)  BUN 7 - 25 mg/dL _0 Creatinine 0.70 - 1.25 mg/dL 0.83 1.07 1.02  BUN/Creat Ratio 6 - 22 (calc) NOT APPLICABLE 21 17  Sodium 135 - 146 mmol/L 138 141 137  Potassium 3.5 - 5.3 mmol/L 4.4 4.4 4.8  Chloride 98 - 110 mmol/L 102 103 100  CO2 20 - 32 mmol/L _1 Calcium 8.6 - 10.3 mg/dL 9.2 9.4 9.4   Patient checks BP at home infrequently. Patient home BP readings are ranging: n/a - has not purchased.  Hydration has been limited due to being on the road. Potassium supplement taken once a week now. Patient is currently near goal on the following medications:  Marland Kitchen Metoprolol succinate 50 mg once daily  . Valsartan 160 mg once daily  We reviewed home BP monitoring due to pt concern of possible over treatment - purchase XL cuff for upper arm - [facility] that [facility] do [facility] this size. Reviewed recommendations to increase water intake/improve hydration.  Plan  Continue current medications and control with diet and exercise.  Pt to purchase home BP cuff.  Hyperlipidemia   LDL goal < 70  Lipid Panel     Component Value Date/Time   CHOL 140 01/28/2020 0913   CHOL 134 01/09/2019 0956   TRIG 79 01/28/2020 0913   TRIG 110 05/26/2006 1118   HDL 54 01/28/2020 0913   HDL 50 01/09/2019 0956   LDLCALC 70 01/28/2020 0913   LDLDIRECT 78.1 01/15/2013 0747    Hepatic Function Latest Ref Rng & Units 01/28/2020 01/09/2019 03/08/2018  Total Protein 6.1 - 8.1 g/dL 6.8 6.8 6.4  Albumin 3.8 - 4.8 g/dL - 4.2 4.0  AST 10 - 35 U/L 38(H) 86(H) 55(H)  ALT 9 - 46 U/L 46 86(H) 73(H)  Alk Phosphatase 39 - 117 IU/L - 105 75  Total Bilirubin 0.2 - 1.2 mg/dL 0.8 0.7 0.6  Bilirubin, Direct <=0.2 mg/dL - - -  Denies side effects. Patient is currently at goal on the following medications:    . Atorvastatin 10 mg once daily  We discussed:  Diet/exercise.  Plan  Continue current medications and control with diet and exercise.  CAD   AAA, resvacularization, HTN, BMI ~36, low intensity statin. Denies any abnormal bruising, bleeding from nose or gums or blood in urine or stool. Relative increase in LFTs last OV 12/2019. Current AP regimen: . Clopidogrel 75 mg once daily . Aspirin 81 mg once daily  Plan  Continue current medications and control with diet and exercise.  Medication management / care coordination   [facility] medications [facility]:  [id number] [place] [place] - [address] [place] [address]Phone: [phone]: [phone]  Continue current management. Denies any issues with current medication management.   Plan  Continue current medication management strategy. Reminded pt of upcoming PCP visit and coordinated scheduling for cardiologist f/u.  SDOH (Social Determinants of Health) assessments performed: Yes. ___________________________ Future Appointments  Date Time Provider Department Center  01/28/2021  8:20 AM PVivi Barrack MD [facility]   Visit follow-up:  . CPA follow-up: 6 month BP call, schedule RPH telephone visit next coming month . RPH follow-up: 7 month phone visit.  JMadelin Rear Pharm.D., BCGP Clinical Pharmacist [phone]

## 2020-04-14 NOTE — Patient Instructions (Addendum)
We reviewed home BP monitoring due to pt concern of possible over treatment - purchase XL cuff for upper arm - [facility]. Reviewed recommendations to increase water intake/improve hydration. Please review care plan below and call me at [phone] with any questions!  Thank you, Alfred [name]., Clinical Pharmacist  Goals Addressed            This Visit's Progress   . Patient Stated   On track    Alfred [name] (see longitudinal plan of care for additional care plan information)  Current Barriers:  . Chronic Disease Management support, education, and care coordination needs related to Hypertension, Hyperlipidemia, and Coronary Artery Disease   Hypertension BP Readings from Last 3 Encounters:  01/25/19 137/82  01/23/19 140/88  01/09/19 (!) 145/89   . Pharmacist Clinical Goal(s): o Over the next 180 days, patient will work with PharmD and providers to achieve BP goal <130/80 . Current regimen:  . Metoprolol succinate 50 mg once daily  . Valsartan 160 mg once daily  . Interventions: o We reviewed home BP monitoring due to pt concern of possible over treatment - purchase XL cuff for upper arm - [facility] do [facility] this size. Reviewed recommendations to increase water intake/improve hydration. . Patient self care activities - Over the next 180 days, patient will: o Check BP at least once every 1-2 weeks, document, and provide at future appointments o Ensure daily salt intake < 2300 mg/day  Hyperlipidemia Lab Results  Component Value Date/Time   LDLCALC 66 01/09/2019 09:56 AM   LDLDIRECT 78.1 01/15/2013 07:47 AM   . Pharmacist Clinical Goal(s): o Over the next 180 days, patient will work with PharmD and providers to maintain LDL goal < 70 . Current regimen:  o Atorvastatin 10 mg daily . Interventions: o Continue current management . Patient self care activities - Over the next 180 days, patient  will: o Continue current management  Coronary Artery Disease . Pharmacist Clinical Goal(s) o Over the next 180 days, patient will work with PharmD and providers to ensure medication safety.  . Current regimen:  o Clopidogrel 75 mg once daily o Aspirin 81 mg once daily . Interventions: o Continue current management o Consider recommendation for high intensity statin . Patient self care activities - Over the next 180 days, patient will: o Continue current management  Medication management . Pharmacist Clinical Goal(s): o Over the next 180 days, patient will work with PharmD and providers to achieve optimal medication adherence . [facility] . Interventions o Comprehensive medication review performed. o Continue current medication management strategy . Patient self care activities - Over the next 180 days, patient will: o Take medications as prescribed o Report any questions or concerns to PharmD and/or provider(s) Initial goal documentation.      The patient verbalized understanding of instructions provided today and agreed to receive a mailed copy of patient instruction and/or educational materials. Telephone follow up appointment with pharmacy team member scheduled for: See next appointment with "Care Management Staff" under "What's Next" below.   Alfred [name], Pharm.D., BCGP Clinical Pharmacist [facility] [phone]

## 2020-05-05 ENCOUNTER — Other Ambulatory Visit: Payer: Self-pay | Admitting: Cardiovascular Disease

## 2020-05-05 DIAGNOSIS — I2510 Atherosclerotic heart disease of native coronary artery without angina pectoris: Secondary | ICD-10-CM

## 2020-05-05 DIAGNOSIS — I10 Essential (primary) hypertension: Secondary | ICD-10-CM

## 2020-05-06 ENCOUNTER — Telehealth (HOSPITAL_COMMUNITY): Payer: Self-pay | Admitting: *Deleted

## 2020-05-06 NOTE — Telephone Encounter (Signed)
The patient had his DOT dates confused and will need a stress test/EF reading before 12/18. Lexiscan ordered for scheduling for DOT requirements.  Will route to Dr. Burt Knack to sign attestation.

## 2020-05-06 NOTE — Telephone Encounter (Signed)
Patient given detailed instructions per Myocardial Perfusion Study Information Sheet for the test on 05/09/20. Patient notified to arrive 15 minutes early and that it is imperative to arrive on time for appointment to keep from having the test rescheduled.  If you need to cancel or reschedule your appointment, please call the office within 24 hours of your appointment. . Patient verbalized understanding. Kirstie Peri

## 2020-05-09 ENCOUNTER — Ambulatory Visit (HOSPITAL_COMMUNITY): Payer: PPO | Attending: Cardiovascular Disease

## 2020-05-09 ENCOUNTER — Other Ambulatory Visit: Payer: Self-pay

## 2020-05-09 DIAGNOSIS — I2510 Atherosclerotic heart disease of native coronary artery without angina pectoris: Secondary | ICD-10-CM

## 2020-05-09 LAB — MYOCARDIAL PERFUSION IMAGING
LV dias vol: 133 mL (ref 62–150)
LV sys vol: 55 mL
Peak HR: 81 {beats}/min
Rest HR: 58 {beats}/min
SDS: 2
SRS: 2
SSS: 4
TID: 1.09

## 2020-05-09 MED ORDER — TECHNETIUM TC 99M TETROFOSMIN IV KIT
31.7000 | PACK | Freq: Once | INTRAVENOUS | Status: AC | PRN
  Administered 2020-05-09: 31.7 via INTRAVENOUS
  Filled 2020-05-09: qty 32

## 2020-05-09 MED ORDER — REGADENOSON 0.4 MG/5ML IV SOLN
0.4000 mg | Freq: Once | INTRAVENOUS | Status: AC
  Administered 2020-05-09: 0.4 mg via INTRAVENOUS

## 2020-05-09 MED ORDER — TECHNETIUM TC 99M TETROFOSMIN IV KIT
10.9000 | PACK | Freq: Once | INTRAVENOUS | Status: AC | PRN
  Administered 2020-05-09: 10.9 via INTRAVENOUS
  Filled 2020-05-09: qty 11

## 2020-06-06 ENCOUNTER — Ambulatory Visit: Payer: PPO | Admitting: Cardiovascular Disease

## 2020-06-12 ENCOUNTER — Ambulatory Visit (INDEPENDENT_AMBULATORY_CARE_PROVIDER_SITE_OTHER): Payer: PPO

## 2020-06-12 ENCOUNTER — Other Ambulatory Visit: Payer: Self-pay

## 2020-06-12 DIAGNOSIS — Z1211 Encounter for screening for malignant neoplasm of colon: Secondary | ICD-10-CM | POA: Diagnosis not present

## 2020-06-12 DIAGNOSIS — Z86010 Personal history of colonic polyps: Secondary | ICD-10-CM

## 2020-06-12 DIAGNOSIS — Z0000 Encounter for general adult medical examination without abnormal findings: Secondary | ICD-10-CM

## 2020-06-12 NOTE — Patient Instructions (Addendum)
Alfred [name] , Thank you for taking time to come for your Medicare Wellness Visit. I appreciate your ongoing commitment to your health goals. Please review the following plan we discussed and let me know if I can assist you in the future.   Screening recommendations/referrals: Colonoscopy: Order place 06/12/20 Recommended yearly ophthalmology/optometry visit for glaucoma screening and checkup Recommended yearly dental visit for hygiene and checkup  Vaccinations: Influenza vaccine: due and discussed Pneumococcal vaccine: Due and discussed Tdap vaccine: Due and discussed Shingles vaccine: 1st dose 06/08/20   Covid-19: Completed 3/4, 4/6, & 05/29/20  Advanced directives: Please bring a copy of your health care power of attorney and living will to the office at your convenience.  Conditions/risks identified: Lose 25lbs on keto diet  Next appointment: Follow up in one year for your annual wellness visit.   Preventive Care 12 Years and Older, Male Preventive care refers to lifestyle choices and visits with your health care provider that can promote health and wellness. What does preventive care include?  A yearly physical exam. This is also called an annual well check.  Dental exams once or twice a year.  Routine eye exams. Ask your health care provider how often you should have your eyes checked.  Personal lifestyle choices, including:  Daily care of your teeth and gums.  Regular physical activity.  Eating a healthy diet.  Avoiding tobacco and drug use.  Limiting alcohol use.  Practicing safe sex.  Taking low doses of aspirin every day.  Taking vitamin and mineral supplements as recommended by your health care provider. What happens during an annual well check? The services and screenings done by your health care provider during your annual well check will depend on your age, overall health, lifestyle risk factors, and family history of disease. Counseling  Your health care  provider may ask you questions about your:  Alcohol use.  Tobacco use.  Drug use.  Emotional well-being.  Home and relationship well-being.  Sexual activity.  Eating habits.  History of falls.  Memory and ability to understand (cognition).  Work and work Statistician. Screening  You may have the following tests or measurements:  Height, weight, and BMI.  Blood pressure.  Lipid and cholesterol levels. These may be checked every 5 years, or more frequently if you are over 40 years old.  Skin check.  Lung cancer screening. You may have this screening every year starting at age 8 if you have a 30-pack-year history of smoking and currently smoke or have quit within the past 15 years.  Fecal occult blood test (FOBT) of the stool. You may have this test every year starting at age 47.  Flexible sigmoidoscopy or colonoscopy. You may have a sigmoidoscopy every 5 years or a colonoscopy every 10 years starting at age 62.  Prostate cancer screening. Recommendations will vary depending on your family history and other risks.  Hepatitis C blood test.  Hepatitis B blood test.  Sexually transmitted disease (STD) testing.  Diabetes screening. This is done by checking your blood sugar (glucose) after you have not eaten for a while (fasting). You may have this done every 1-3 years.  Abdominal aortic aneurysm (AAA) screening. You may need this if you are a current or former smoker.  Osteoporosis. You may be screened starting at age 62 if you are at high risk. Talk with your health care provider about your test results, treatment options, and if necessary, the need for more tests. Vaccines  Your health care provider may  recommend certain vaccines, such as:  Influenza vaccine. This is recommended every year.  Tetanus, diphtheria, and acellular pertussis (Tdap, Td) vaccine. You may need a Td booster every 10 years.  Zoster vaccine. You may need this after age 33.  Pneumococcal  13-valent conjugate (PCV13) vaccine. One dose is recommended after age 33.  Pneumococcal polysaccharide (PPSV23) vaccine. One dose is recommended after age 72. Talk to your health care provider about which screenings and vaccines you need and how often you need them. This information is not intended to replace advice given to you by your health care provider. Make sure you discuss any questions you have with your health care provider. Document Released: 06/13/2015 Document Revised: 02/04/2016 Document Reviewed: 03/18/2015 Elsevier Interactive Patient Education  2017 [place] Prevention in the Home Falls can cause injuries. They can happen to people of all ages. There are many things you can do to make your home safe and to help prevent falls. What can I do on the outside of my home?  Regularly fix the edges of walkways and driveways and fix any cracks.  Remove anything that might make you trip as you walk through a door, such as a raised step or threshold.  Trim any bushes or trees on the path to your home.  Use bright outdoor lighting.  Clear any walking paths of anything that might make someone trip, such as rocks or tools.  Regularly check to see if handrails are loose or broken. Make sure that both sides of any steps have handrails.  Any raised decks and porches should have guardrails on the edges.  Have any leaves, snow, or ice cleared regularly.  Use sand or salt on walking paths during winter.  Clean up any spills in your garage right away. This includes oil or grease spills. What can I do in the bathroom?  Use night lights.  Install grab bars by the toilet and in the tub and shower. Do not use towel bars as grab bars.  Use non-skid mats or decals in the tub or shower.  If you need to sit down in the shower, use a plastic, non-slip stool.  Keep the floor dry. Clean up any water that spills on the floor as soon as it happens.  Remove soap buildup in the  tub or shower regularly.  Attach bath mats securely with double-sided non-slip rug tape.  Do not have throw rugs and other things on the floor that can make you trip. What can I do in the bedroom?  Use night lights.  Make sure that you have a light by your bed that is easy to reach.  Do not use any sheets or blankets that are too big for your bed. They should not hang down onto the floor.  Have a firm chair that has side arms. You can use this for support while you get dressed.  Do not have throw rugs and other things on the floor that can make you trip. What can I do in the kitchen?  Clean up any spills right away.  Avoid walking on wet floors.  Keep items that you use a lot in easy-to-reach places.  If you need to reach something above you, use a strong step stool that has a grab bar.  Keep electrical cords out of the way.  Do not use floor polish or wax that makes floors slippery. If you must use wax, use non-skid floor wax.  Do not have throw rugs and  other things on the floor that can make you trip. What can I do with my stairs?  Do not leave any items on the stairs.  Make sure that there are handrails on both sides of the stairs and use them. Fix handrails that are broken or loose. Make sure that handrails are as long as the stairways.  Check any carpeting to make sure that it is firmly attached to the stairs. Fix any carpet that is loose or worn.  Avoid having throw rugs at the top or bottom of the stairs. If you do have throw rugs, attach them to the floor with carpet tape.  Make sure that you have a light switch at the top of the stairs and the bottom of the stairs. If you do not have them, ask someone to add them for you. What else can I do to help prevent falls?  Wear shoes that:  Do not have high heels.  Have rubber bottoms.  Are comfortable and fit you well.  Are closed at the toe. Do not wear sandals.  If you use a stepladder:  Make sure that it  is fully opened. Do not climb a closed stepladder.  Make sure that both sides of the stepladder are locked into place.  Ask someone to hold it for you, if possible.  Clearly mark and make sure that you can see:  Any grab bars or handrails.  First and last steps.  Where the edge of each step is.  Use tools that help you move around (mobility aids) if they are needed. These include:  Canes.  Walkers.  Scooters.  Crutches.  Turn on the lights when you go into a dark area. Replace any light bulbs as soon as they burn out.  Set up your furniture so you have a clear path. Avoid moving your furniture around.  If any of your floors are uneven, fix them.  If there are any pets around you, be aware of where they are.  Review your medicines with your doctor. Some medicines can make you feel dizzy. This can increase your chance of falling. Ask your doctor what other things that you can do to help prevent falls. This information is not intended to replace advice given to you by your health care provider. Make sure you discuss any questions you have with your health care provider. Document Released: 03/13/2009 Document Revised: 10/23/2015 Document Reviewed: 06/21/2014 Elsevier Interactive Patient Education  2017 [employer].

## 2020-06-12 NOTE — Progress Notes (Signed)
Virtual Visit via Telephone Note  I connected with  Alfred [name] on 06/12/20 at  8:00 AM EST by telephone and verified that I am speaking with the correct person using two identifiers.  Medicare Annual Wellness visit completed telephonically due to Covid-19 pandemic.   Persons participating in this call: This Health Coach and this patient.   Location: Patient: Home Provider: Office   I discussed the limitations, risks, security and privacy concerns of performing an evaluation and management service by telephone and the availability of in person appointments. The patient expressed understanding and agreed to proceed.  Unable to perform video visit due to video visit attempted and failed and/or patient does not have video capability.   Some vital signs may be absent or patient reported.   Willette Brace, LPN    Subjective:   Alfred [name] is a 70 y.o. male who presents for Medicare Annual/Subsequent preventive examination.  Review of Systems     Cardiac Risk Factors include: advanced age (>5men, >68 women);dyslipidemia;hypertension;male gender;obesity (BMI >30kg/m2)     Objective:    There were no vitals filed for this visit. There is no height or weight on file to calculate BMI.  Advanced Directives 06/12/2020 04/30/2019 01/22/2016 01/09/2015 01/21/2012 01/18/2012 01/04/2012  Does Patient Have a Medical Advance Directive? Yes Yes Yes Yes Patient has advance directive, copy not in chart Patient has advance directive, copy not in chart Patient does not have advance directive;Patient would not like information  Type of Advance Directive Living will Living will;Healthcare Power of [place];Living will [place];Living will Healthcare Power of [place] -  Does patient want to make changes to medical advance directive? - No - Patient declined - No - Patient declined - - -  Copy of [place]  in Chart? - No - copy requested - - Copy requested from other (Comment) Copy requested from other (Comment) -  Would patient like information on creating a medical advance directive? - - - - - - -  Pre-existing out of facility DNR order (yellow form or pink MOST form) - - - - No - -    Current Medications (verified) Outpatient Encounter Medications as of 06/12/2020  Medication Sig  . acetaminophen (TYLENOL) 500 MG tablet Take 500 mg by mouth every 6 (six) hours as needed for mild pain. PRN for pain/fever  . aspirin 81 MG tablet Take 81 mg by mouth at bedtime.  Marland Kitchen atorvastatin (LIPITOR) 10 MG tablet TAKE 1 TABLET BY MOUTH DAILY AT 6 PM. PT NEEDS APPOINTMENT FOR REFILLS. PLEASE CALL [phone].  . clopidogrel (PLAVIX) 75 MG tablet TAKE 1 TABLET BY MOUTH EVERY DAY  . famotidine (PEPCID) 40 MG tablet Take 1 tablet by mouth daily as needed for heartburn or indigestion.   . metoprolol succinate (TOPROL-XL) 50 MG 24 hr tablet TAKE 1 TABLET (50 MG TOTAL) BY MOUTH DAILY. TAKE WITH OR IMMEDIATELY FOLLOWING A MEAL.  . valsartan (DIOVAN) 160 MG tablet TAKE 1 TABLET BY MOUTH EVERY DAY   No facility-administered encounter medications on file as of 06/12/2020.    Allergies (verified) Patient has no known allergies.   History: Past Medical History:  Diagnosis Date  . AAA (abdominal aortic aneurysm) ([place]) 08/2011   see Surgical history  . Anginal pain ([place])   . Arthritis    "knees; not bad"  . CAD (coronary artery disease)    DES to RCA and CFX; Dr. Burt Knack cardiologist  .  Depression ~ 2009   'treated for ~ 6 months"  . Dyslipidemia   . HTN (hypertension)    unspecified essential  . Hx of adenomatous colonic polyps 2008  . Hyperplasia of prostate without lower urinary tract symptoms (LUTS)   . Left lumbar radiculopathy   . Other and unspecified hyperlipidemia   . Stroke [facility])    Past Surgical History:  Procedure Laterality Date  . ABDOMINAL AORTIC ANEURYSM REPAIR  09/06/2011  . ABDOMINAL  AORTIC ANEURYSM REPAIR  01/21/2012   Procedure: ANEURYSM ABDOMINAL AORTIC REPAIR;  Surgeon: Elam Dutch, MD;  Location: [facility];  Service: Vascular;  Laterality: N/A;  Aortio- bi-iliac repair  using Cryolife descending thoracic aorta 22mm x10.5 cm.  . APPENDECTOMY  1970  . ARTERY REPAIR  09/06/2011   Procedure: ARTERY REPAIR;  Surgeon: Elam Dutch, MD;  Location: [facility];  Service: Vascular;  Laterality: Bilateral;  . COLONOSCOPY W/ POLYPECTOMY  2008   adenomatous  & hyperplastic polyp;due 2013  . CORONARY ANGIOPLASTY WITH STENT PLACEMENT  2002; 2005   "2 +1; total of 3"  . CYSTOSCOPY  2005   varicose veins in bladder, Dr Diona Fanti  . [place]  . VASECTOMY     Family History  Problem Relation Age of Onset  . Coronary artery disease Father        CABG in 6s  . Diabetes Father   . Colon polyps Father   . Lung cancer Father        non-small cell carcinoma/bone CA  . Hypertension Father   . Coronary artery disease Maternal Grandfather   . Coronary artery disease Paternal Grandfather   . Other Mother        varicose veins  . Anesthesia problems Neg Hx   . Stroke Neg Hx    Social History   Socioeconomic History  . Marital status: Married    Spouse name: Not on file  . Number of children: Not on file  . Years of education: Not on file  . Highest education level: Not on file  Occupational History  . Occupation: Farming  Tobacco Use  . Smoking status: Former Smoker    Packs/day: 1.00    Years: 27.00    Pack years: 27.00    Types: Cigarettes    Quit date: 05/31/1993    Years since quitting: 27.0  . Smokeless tobacco: Current User    Types: Snuff  . Tobacco comment: smoked 1968-1995, up to 1 ppd. Currently using Skoal 1 can / day  Vaping Use  . Vaping Use: Never used  Substance and Sexual Activity  . Alcohol use: Yes    Alcohol/week: 21.0 standard drinks    Types: 21 Shots of liquor per week    Comment: 2-3 drinks of Vodka a night,  occasional beer  . Drug use: No  . Sexual activity: Yes  Other Topics Concern  . Not on file  Social History Narrative  . Not on file   Social Determinants of Health   Financial Resource Strain: Low Risk   . Difficulty of Paying Living Expenses: Not hard at all  Food Insecurity: No Food Insecurity  . Worried About Charity fundraiser in the Last Year: Never true  . Ran Out of Food in the Last Year: Never true  Transportation Needs: No Transportation Needs  . Lack of Transportation (Medical): No  . Lack of Transportation (Non-Medical): No  Physical Activity: Inactive  . Days of Exercise per Week:  0 days  . Minutes of Exercise per Session: 0 min  Stress: No Stress Concern Present  . Feeling of Stress : Not at all  Social Connections: Moderately Integrated  . Frequency of Communication with Friends and Family: More than three times a week  . Frequency of Social Gatherings with Friends and Family: More than three times a week  . Attends Religious Services: More than 4 times per year  . Active Member of Clubs or Organizations: No  . Attends Archivist Meetings: Never  . Marital Status: Married    Tobacco Counseling Ready to quit: Not Answered Counseling given: Not Answered Comment: smoked 1968-1995, up to 1 ppd. Currently using Skoal 1 can / day   Clinical Intake:  Pre-visit preparation completed: Yes  Pain : No/denies pain     BMI - recorded: 37.03 Nutritional Risks: None Diabetes: No  How often do you need to have someone help you when you read instructions, pamphlets, or other written materials from your doctor or pharmacy?: 1 - Never  Diabetic?No  Interpreter Needed?: No  Information entered by :: Charlott Rakes, LPN   Activities of Daily Living In your present state of health, do you have any difficulty performing the following activities: 06/12/2020  Hearing? Y  Comment mild loss  Vision? N  Difficulty concentrating or making decisions? Y   Comment memory at times  Walking or climbing stairs? N  Dressing or bathing? N  Doing errands, shopping? N  Preparing Food and eating ? N  Using the Toilet? N  In the past six months, have you accidently leaked urine? Y  Comment urgency at times  Do you have problems with loss of bowel control? Y  Comment with keto at times  Managing your Medications? N  Managing your Finances? N  Housekeeping or managing your Housekeeping? N  Some recent data might be hidden    Patient Care Team: Vivi Barrack, MD as PCP - General (Family Medicine) Sherren Mocha, MD as PCP - Cardiology (Cardiology) [name], [facility] as Pharmacist (Pharmacist)  Indicate any recent Medical Services you may have received from other than [facility] providers in the past year (date may be approximate).     Assessment:   This is a routine wellness examination for [place].  Hearing/Vision screen  Hearing Screening   125Hz  250Hz  500Hz  1000Hz  2000Hz  3000Hz  4000Hz  6000Hz  8000Hz   Right ear:           Left ear:           Comments: Mild loss   Vision Screening Comments: Pt follows Dr Renaldo Fiddler office [facility]  Dietary issues and exercise activities discussed: Current Exercise Habits: The patient has a physically strenuous job, but has no regular exercise apart from work.  Goals    . Patient [place] (see longitudinal plan of care for additional care plan information)  Current Barriers:  . Chronic Disease Management support, education, and care coordination needs related to Hypertension, Hyperlipidemia, and Coronary Artery Disease   Hypertension BP Readings from Last 3 Encounters:  01/25/19 137/82  01/23/19 140/88  01/09/19 (!) 145/89   . Pharmacist Clinical Goal(s): o Over the next 180 days, patient will work with PharmD and providers to achieve BP goal <130/80 . Current regimen:  . Metoprolol succinate 50 mg once daily  . Valsartan 160 mg once daily  . Interventions: o We  reviewed home BP monitoring due to pt concern of possible over treatment -  purchase XL cuff for upper arm - has been confirmed with [facility] that they do carry this size. Reviewed recommendations to increase water intake/improve hydration. . Patient self care activities - Over the next 180 days, patient will: o Check BP at least once every 1-2 weeks, document, and provide at future appointments o Ensure daily salt intake < 2300 mg/day  Hyperlipidemia Lab Results  Component Value Date/Time   LDLCALC 66 01/09/2019 09:56 AM   LDLDIRECT 78.1 01/15/2013 07:47 AM   . Pharmacist Clinical Goal(s): o Over the next 180 days, patient will work with PharmD and providers to maintain LDL goal < 70 . Current regimen:  o Atorvastatin 10 mg daily . Interventions: o Continue current management . Patient self care activities - Over the next 180 days, patient will: o Continue current management  Coronary Artery Disease . Pharmacist Clinical Goal(s) o Over the next 180 days, patient will work with PharmD and providers to ensure medication safety.  . Current regimen:  o Clopidogrel 75 mg once daily o Aspirin 81 mg once daily . Interventions: o Continue current management o Consider recommendation for high intensity statin . Patient self care activities - Over the next 180 days, patient will: o Continue current management  Medication management . Pharmacist Clinical Goal(s): o Over the next 180 days, patient will work with PharmD and providers to achieve optimal medication adherence . [facility] . Interventions o Comprehensive medication review performed. o Continue current medication management strategy . Patient self care activities - Over the next 180 days, patient will: o Take medications as prescribed o Report any questions or concerns to PharmD and/or provider(s) Initial goal documentation.    . Patient Stated     Lose 25lbs       Depression Screen PHQ 2/9 Scores 06/12/2020  04/30/2019 01/25/2019 01/25/2018 01/16/2013 02/21/2012  PHQ - 2 Score 0 0 0 0 0 0    Fall Risk Fall Risk  06/12/2020 04/30/2019 01/25/2018  Falls in the past year? 0 0 No  Number falls in past yr: 0 - -  Injury with Fall? 0 0 -  Risk for fall due to : Impaired vision - -  Follow up Falls prevention discussed Falls evaluation completed;Education provided;Falls prevention discussed -    FALL RISK PREVENTION PERTAINING TO THE HOME:  Any stairs in or around the home? Yes  If so, are there any without handrails? No  Home free of loose throw rugs in walkways, pet beds, electrical cords, etc? Yes  Adequate lighting in your home to reduce risk of falls? Yes   ASSISTIVE DEVICES UTILIZED TO PREVENT FALLS:  Life alert? No  Use of a cane, walker or w/c? No  Grab bars in the bathroom? No  Shower chair or bench in shower? No  Elevated toilet seat or a handicapped toilet? No   TIMED UP AND GO:  Was the test performed? No .    Cognitive Function:     6CIT Screen 06/12/2020  What Year? 0 points  What month? 0 points  Count back from 20 0 points  Months in reverse 0 points  Repeat phrase 0 points    Immunizations Immunization History  Administered Date(s) Administered  . Fluad Quad(high Dose 65+) 01/25/2019  . PFIZER SARS-COV-2 Vaccination 08/02/2019, 09/04/2019, 05/29/2020  . Pneumococcal Conjugate-13 01/25/2018  . Zoster Recombinat (Shingrix) 06/08/2020    TDAP status: Due, Education has been provided regarding the importance of this vaccine. Advised may receive this vaccine at local pharmacy or  Health Dept. Aware to provide a copy of the vaccination record if obtained from local pharmacy or Health Dept. Verbalized acceptance and understanding.  Flu Vaccine status: Due, Education has been provided regarding the importance of this vaccine. Advised may receive this vaccine at local pharmacy or Health Dept. Aware to provide a copy of the vaccination record if obtained from local pharmacy  or Health Dept. Verbalized acceptance and understanding.  Pneumococcal vaccine status: Due, Education has been provided regarding the importance of this vaccine. Advised may receive this vaccine at local pharmacy or Health Dept. Aware to provide a copy of the vaccination record if obtained from local pharmacy or Health Dept. Verbalized acceptance and understanding.  Covid-19 vaccine status: Completed vaccines  Qualifies for Shingles Vaccine? Yes   Zostavax completed Yes   Shingrix Completed?: Yes  Screening Tests Health Maintenance  Topic Date Due  . COLONOSCOPY (Pts 45-69yrs Insurance coverage will need to be confirmed)  04/05/2020  . INFLUENZA VACCINE  08/28/2020 (Originally 12/30/2019)  . TETANUS/TDAP  12/29/2020 (Originally 03/21/1970)  . Hepatitis C Screening  12/29/2020 (Originally 17-Feb-1951)  . PNA vac Low Risk Adult (2 of 2 - PPSV23) 12/29/2020 (Originally 01/26/2019)  . COVID-19 Vaccine (4 - Booster for [employer] series) 11/27/2020    Health Maintenance  Health Maintenance Due  Topic Date Due  . COLONOSCOPY (Pts 45-31yrs Insurance coverage will need to be confirmed)  04/05/2020    Colorectal cancer screening: Referral to GI placed 06/13/19. Pt aware the office will call re: appt.   Additional Screening:  Hepatitis C Screening: does qualify;   Vision Screening: Recommended annual ophthalmology exams for early detection of glaucoma and other disorders of the eye. Is the patient up to date with their annual eye exam?  Yes  Who is the provider or what is the name of the office in which the patient attends annual eye exams? Dr Renaldo Fiddler office   Dental Screening: Recommended annual dental exams for proper oral hygiene  Community Resource Referral / Chronic Care Management: CRR required this visit?  No   CCM required this visit?  No      Plan:     I have personally reviewed and noted the following in the patient's chart:   . Medical and social history . Use of  alcohol, tobacco or illicit drugs  . Current medications and supplements . Functional ability and status . Nutritional status . Physical activity . Advanced directives . List of other physicians . Hospitalizations, surgeries, and ER visits in previous 12 months . Vitals . Screenings to include cognitive, depression, and falls . Referrals and appointments  In addition, I have reviewed and discussed with patient certain preventive protocols, quality metrics, and best practice recommendations. A written personalized care plan for preventive services as well as general preventive health recommendations were provided to patient.     Willette Brace, LPN   [id number]   Nurse Notes: None

## 2020-06-26 ENCOUNTER — Telehealth: Payer: Self-pay

## 2020-06-26 NOTE — Chronic Care Management (AMB) (Signed)
Chronic Care Management Pharmacy Assistant   Name: ROMMEL HOGSTON  MRN: [id number] DOB: 1951/01/13  Reason for Encounter: Disease State/ Hypertension Adherence Call  PCP : Vivi Barrack, MD  Allergies:  No Known Allergies  Medications: Outpatient Encounter Medications as of 06/26/2020  Medication Sig  . acetaminophen (TYLENOL) 500 MG tablet Take 500 mg by mouth every 6 (six) hours as needed for mild pain. PRN for pain/fever  . aspirin 81 MG tablet Take 81 mg by mouth at bedtime.  Marland Kitchen atorvastatin (LIPITOR) 10 MG tablet TAKE 1 TABLET BY MOUTH DAILY AT 6 PM. PT NEEDS APPOINTMENT FOR REFILLS. PLEASE CALL [phone].  . clopidogrel (PLAVIX) 75 MG tablet TAKE 1 TABLET BY MOUTH EVERY DAY  . famotidine (PEPCID) 40 MG tablet Take 1 tablet by mouth daily as needed for heartburn or indigestion.   . metoprolol succinate (TOPROL-XL) 50 MG 24 hr tablet TAKE 1 TABLET (50 MG TOTAL) BY MOUTH DAILY. TAKE WITH OR IMMEDIATELY FOLLOWING A MEAL.  . valsartan (DIOVAN) 160 MG tablet TAKE 1 TABLET BY MOUTH EVERY DAY   No facility-administered encounter medications on file as of 06/26/2020.    Current Diagnosis: Patient Active Problem List   Diagnosis Date Noted  . Actinic keratosis 01/28/2020  . CAD (coronary artery disease)   . HYPERTENSION, BENIGN 05/02/2009  . History of AAA (abdominal aortic aneurysm) repair 11/05/2008  . COLONIC POLYPS, ADENOMATOUS 03/28/2008  . Hyperlipidemia 03/28/2008  . LUMBAR RADICULOPATHY, LEFT 03/28/2008    Reviewed chart prior to disease state call. Spoke with patient regarding BP  Recent Office Vitals: BP Readings from Last 3 Encounters:  02/12/20 126/78  01/28/20 123/85  01/25/19 137/82   Pulse Readings from Last 3 Encounters:  02/12/20 69  01/28/20 (!) 58  01/25/19 63    Wt Readings from Last 3 Encounters:  05/09/20 273 lb (123.8 kg)  02/12/20 273 lb 3.2 oz (123.9 kg)  01/28/20 276 lb (125.2 kg)     Kidney Function Lab Results  Component Value  Date/Time   CREATININE 0.83 01/28/2020 09:13 AM   CREATININE 1.07 01/23/2019 02:32 PM   CREATININE 1.02 01/09/2019 09:56 AM   CREATININE 1.07 01/07/2016 08:14 AM   GFR 66.92 03/29/2014 08:26 AM   GFRNONAA 71 01/23/2019 02:32 PM   GFRAA 83 01/23/2019 02:32 PM    BMP Latest Ref Rng & Units 01/28/2020 01/23/2019 01/09/2019  Glucose 65 - 99 mg/dL 106(H) 93 106(H)  BUN 7 - 25 mg/dL 23 22 17   Creatinine 0.70 - 1.25 mg/dL 0.83 1.07 1.02  BUN/Creat Ratio 6 - 22 (calc) NOT APPLICABLE 21 17  Sodium 135 - 146 mmol/L 138 141 137  Potassium 3.5 - 5.3 mmol/L 4.4 4.4 4.8  Chloride 98 - 110 mmol/L 102 103 100  CO2 20 - 32 mmol/L 29 24 23   Calcium 8.6 - 10.3 mg/dL 9.2 9.4 9.4    . Current antihypertensive regimen:  o Metoprolol 50 mg XR once daily o Valsartan 160 mg daily  . How often are you checking your Blood Pressure? 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  . Current home BP readings: n/a  . What recent interventions/DTPs have been made by any provider to improve Blood Pressure control since last CPP Visit: Patient states he has been taking his prescribed medications as directed.  . Any recent hospitalizations or ED visits since last visit with CPP? No   . What diet changes have been made  to improve Blood Pressure Control?  o Patient states he has been avoiding salty foods.  . What exercise is being done to improve your Blood Pressure Control?  o Patient states he works a lot as his exercise at this time.  Adherence Review: Is the patient currently on ACE/ARB medication? Yes Does the patient have >5 day gap between last estimated fill dates? No   April D Calhoun, [place] Pharmacist Assistant [phone]   Follow-Up:  Pharmacist Review

## 2020-07-09 ENCOUNTER — Encounter: Payer: Self-pay | Admitting: Gastroenterology

## 2020-07-16 DIAGNOSIS — M62838 Other muscle spasm: Secondary | ICD-10-CM | POA: Diagnosis not present

## 2020-07-25 ENCOUNTER — Other Ambulatory Visit: Payer: Self-pay

## 2020-07-25 ENCOUNTER — Ambulatory Visit (INDEPENDENT_AMBULATORY_CARE_PROVIDER_SITE_OTHER): Payer: PPO | Admitting: Family Medicine

## 2020-07-25 ENCOUNTER — Encounter: Payer: Self-pay | Admitting: Family Medicine

## 2020-07-25 VITALS — BP 181/96 | HR 65 | Temp 97.3°F | Ht 72.0 in | Wt 293.6 lb

## 2020-07-25 DIAGNOSIS — I10 Essential (primary) hypertension: Secondary | ICD-10-CM

## 2020-07-25 DIAGNOSIS — M5412 Radiculopathy, cervical region: Secondary | ICD-10-CM

## 2020-07-25 MED ORDER — METHYLPREDNISOLONE ACETATE 80 MG/ML IJ SUSP
80.0000 mg | Freq: Once | INTRAMUSCULAR | Status: AC
  Administered 2020-07-25: 80 mg via INTRAMUSCULAR

## 2020-07-25 MED ORDER — CYCLOBENZAPRINE HCL 10 MG PO TABS: 10.0000 mg | ORAL_TABLET | Freq: Three times a day (TID) | ORAL | 0 refills | Status: DC | PRN

## 2020-07-25 MED ORDER — KETOROLAC TROMETHAMINE 60 MG/2ML IM SOLN
60.0000 mg | Freq: Once | INTRAMUSCULAR | Status: AC
  Administered 2020-07-25: 60 mg via INTRAMUSCULAR

## 2020-07-25 MED ORDER — PREDNISONE 50 MG PO TABS: ORAL_TABLET | ORAL | 0 refills | Status: DC

## 2020-07-25 MED ORDER — GABAPENTIN 300 MG PO CAPS: 300.0000 mg | ORAL_CAPSULE | Freq: Every day | ORAL | 3 refills | Status: DC

## 2020-07-25 NOTE — Addendum Note (Signed)
Addended by: Betti Cruz on: 07/25/2020 10:38 AM   Modules accepted: Orders

## 2020-07-25 NOTE — Assessment & Plan Note (Signed)
Well above goal in setting of acute pain.  Has been well controlled in the past.  We will continue current regimen with valsartan 160 mg daily and metoprolol 50 mg daily. Continue home monitoring goal 150/90 or lower.

## 2020-07-25 NOTE — Patient Instructions (Signed)
It was very nice to see you today!  We gave you 2 injections today of anti-inflammatories.  I will also send in a muscle relaxer, gabapentin, and more prednisone.  I will place a referral for you to see the neurosurgeon.  Please let me know if symptoms are not improving.  Take care, Dr Jerline Pain  Please try these tips to maintain a healthy lifestyle:   Eat at least 3 REAL meals and 1-2 snacks per day.  Aim for no more than 5 hours between eating.  If you eat breakfast, please do so within one hour of getting up.    Each meal should contain half fruits/vegetables, one quarter protein, and one quarter carbs (no bigger than a computer mouse)   Cut down on sweet beverages. This includes juice, soda, and sweet tea.     Drink at least 1 glass of water with each meal and aim for at least 8 glasses per day   Exercise at least 150 minutes every week.

## 2020-07-25 NOTE — Progress Notes (Signed)
   Alfred [name] is a 70 y.o. male who presents today for an office visit.  Assessment/Plan:  New/Acute Problems: Cervical Radiculopathy No red flags.  Had x-rays done at chiropractor's office which reportedly showed a pinched nerve.  He is in severe pain today.  We will give 60 mg of Toradol, 80 mg of Depo-Medrol.  We will also start prednisone burst.  Start gabapentin 300 mg at night and Flexeril 10 mg 3 times daily as needed.  Given his recent x-ray do not think we need to obtain any further imaging at this point though will place referral to neurosurgery given his severity of symptoms.  Will likely need MRI at some point.  Discussed reasons to return to care and seek emergent care.  Chronic Problems Addressed Today: HYPERTENSION, BENIGN Well above goal in setting of acute pain.  Has been well controlled in the past.  We will continue current regimen with valsartan 160 mg daily and metoprolol 50 mg daily. Continue home monitoring goal 150/90 or lower.      Subjective:  HPI:  Patient here with concern for pinched nerve.  Symptoms started 13 days ago with pain and lower back and upper back radiating into his left arm.  Pain is described as a tingling and burning sensation.  He saw a Restaurant manager, fast food.  Had x-ray done at that time.  Was told he had a pinched nerve.  He was started on prednisone taper has had modest improvement.  He has been on low-dose prednisone the last couple days and symptoms have significantly worsened.  He tried taking a muscle relaxer and tramadol which also helped.       Objective:  Physical Exam: BP (!) 181/96   Pulse 65   Temp (!) 97.3 F (36.3 C) (Temporal)   Ht 6' (1.829 m)   Wt 293 lb 9.6 oz (133.2 kg)   SpO2 97%   BMI 39.82 kg/m   Gen: No acute distress, resting comfortably MSK: Back with no deformities.  Tender to palpation along left cervical paraspinal muscles.  Neurovascular intact distally and left upper extremity.  Radial pulse 2+.  Strength 5 out of  5. Neuro: Grossly normal, moves all extremities Psych: Normal affect and thought content      Damisha Wolff M. Jerline Pain, MD 07/25/2020 9:48 AM

## 2020-08-05 ENCOUNTER — Telehealth: Payer: Self-pay

## 2020-08-05 NOTE — Telephone Encounter (Signed)
Pt meets with the neurosurgeon on March 17. He is requesting a MRI be ordered now, so he can have that to bring to the neurosurgeon.

## 2020-08-06 ENCOUNTER — Other Ambulatory Visit: Payer: Self-pay | Admitting: *Deleted

## 2020-08-06 DIAGNOSIS — M5412 Radiculopathy, cervical region: Secondary | ICD-10-CM

## 2020-08-06 NOTE — Telephone Encounter (Signed)
Ok to place order for MRI c spine without contrast but I am not sure if we will be able to get it done before his neurosurgery follow up.  Algis Greenhouse. Jerline Pain, MD 08/06/2020 10:55 AM

## 2020-08-06 NOTE — Telephone Encounter (Signed)
See note

## 2020-08-06 NOTE — Telephone Encounter (Signed)
MRI  order patient notified

## 2020-08-10 ENCOUNTER — Ambulatory Visit
Admission: RE | Admit: 2020-08-10 | Discharge: 2020-08-10 | Disposition: A | Discharge status: Home / Self Care | Payer: PPO | Source: Ambulatory Visit | Attending: Family Medicine | Admitting: Family Medicine

## 2020-08-10 ENCOUNTER — Other Ambulatory Visit: Payer: Self-pay | Admitting: Family Medicine

## 2020-08-10 ENCOUNTER — Other Ambulatory Visit: Payer: Self-pay

## 2020-08-10 DIAGNOSIS — M4802 Spinal stenosis, cervical region: Secondary | ICD-10-CM | POA: Diagnosis not present

## 2020-08-10 DIAGNOSIS — M5412 Radiculopathy, cervical region: Secondary | ICD-10-CM

## 2020-08-10 DIAGNOSIS — Z01818 Encounter for other preprocedural examination: Secondary | ICD-10-CM | POA: Diagnosis not present

## 2020-08-11 ENCOUNTER — Encounter: Payer: Self-pay | Admitting: Family Medicine

## 2020-08-11 NOTE — Progress Notes (Signed)
Please inform patient of the following:  MRI shows several areas of disc degeneration and possible nerve impingement.  Would like to him to follow-up with neurosurgery ASAP as we discussed.

## 2020-08-12 ENCOUNTER — Other Ambulatory Visit: Payer: PPO

## 2020-08-12 ENCOUNTER — Other Ambulatory Visit: Payer: Self-pay | Admitting: *Deleted

## 2020-08-12 DIAGNOSIS — M5412 Radiculopathy, cervical region: Secondary | ICD-10-CM

## 2020-08-13 ENCOUNTER — Other Ambulatory Visit: Payer: Self-pay | Admitting: *Deleted

## 2020-08-15 DIAGNOSIS — M5412 Radiculopathy, cervical region: Secondary | ICD-10-CM | POA: Diagnosis not present

## 2020-08-29 ENCOUNTER — Other Ambulatory Visit: Payer: Self-pay | Admitting: Cardiovascular Disease

## 2020-08-29 ENCOUNTER — Other Ambulatory Visit: Payer: PPO

## 2020-09-30 ENCOUNTER — Telehealth: Payer: Self-pay

## 2020-09-30 ENCOUNTER — Encounter: Payer: Self-pay | Admitting: Gastroenterology

## 2020-09-30 ENCOUNTER — Other Ambulatory Visit: Payer: Self-pay

## 2020-09-30 ENCOUNTER — Ambulatory Visit: Payer: PPO | Admitting: Gastroenterology

## 2020-09-30 VITALS — BP 136/80 | HR 56 | Ht 72.0 in | Wt 301.0 lb

## 2020-09-30 DIAGNOSIS — Z86010 Personal history of colonic polyps: Secondary | ICD-10-CM | POA: Diagnosis not present

## 2020-09-30 DIAGNOSIS — Z7902 Long term (current) use of antithrombotics/antiplatelets: Secondary | ICD-10-CM | POA: Diagnosis not present

## 2020-09-30 NOTE — Telephone Encounter (Signed)
Please see cardiology's note regarding Plavix clearance. Is it ok that patient holds Plavix prior to his scheduled colonoscopy?

## 2020-09-30 NOTE — Telephone Encounter (Signed)
[facility] Pre-operative Risk Assessment     Request for surgical clearance:     Endoscopy Procedure  What type of surgery is being performed?     Colonoscopy  When is this surgery scheduled?     10/22/20  What type of clearance is required ?   Pharmacy  Are there any medications that need to be held prior to surgery and how long? Plavix x 5 days  Practice name and name of physician performing surgery?      [facility]  What is your office phone and fax number?      Phone- [phone]  [phone]  Anesthesia type (None, local, MAC, general) ?       MAC

## 2020-09-30 NOTE — Patient Instructions (Signed)
You have been scheduled for a colonoscopy. Please follow written instructions given to you at your visit today.  Please pick up your prep supplies at the pharmacy within the next 1-3 days. If you use inhalers (even only as needed), please bring them with you on the day of your procedure.  Thank you for choosing me and [facility].  Malcolm T. Stark, Jr., MD., FACG  

## 2020-09-30 NOTE — Telephone Encounter (Signed)
   Name: Alfred [name]  DOB: August 18, 1950  MRN: [id number]   Primary Cardiologist: Sherren Mocha, MD  Chart reviewed as part of pre-operative protocol coverage.   Alfred [name] was last seen on 02/12/20 by Dr. Burt Knack and was doing well at that time. We have been asked for guidance to hold plavix for upcoming endoscopy. Per Dr. Antionette Char previous note dated 03/08/18, he may hold plavix for endoscopy procedures.  He remains on longterm DAPT due to first generation drug-eluting stents. He also has a history of AAA repair and CVA. Requesting office should contact PCP and [facility] for plavix hold for these additional conditions.    I will route this recommendation to the requesting party via Epic fax function and remove from pre-op pool. Please call with questions.  [place], PA 09/30/2020, 11:37 AM

## 2020-09-30 NOTE — Telephone Encounter (Signed)
Dr. Burt Knack, Pt has a history of PCI in 2005, AAA repair in 2013, and CVA. From a cardiac perspective, may he hold plavix for endoscopy? I will also have the requesting office reach out to PCP and [facility].

## 2020-09-30 NOTE — Telephone Encounter (Signed)
I am fine with him holding plavix.  Thanks! -CMP

## 2020-09-30 NOTE — Progress Notes (Signed)
    History of Present Illness: This is a 70 year old male returning for follow-up of multiple adenomatous colon polyps.  He is maintained on Plavix.  Last colonoscopy was performed in November 2019 with 10 tubular adenomas removed.  He has no ongoing gastrointestinal complaints.  Current Medications, Allergies, Past Medical History, Past Surgical History, Family History and Social History were reviewed in [employer] record.   Physical Exam: General: Well developed, well nourished, no acute distress Head: Normocephalic and atraumatic Eyes: Sclerae anicteric, EOMI Ears: Normal auditory acuity Mouth: Not examined, mask on during Covid-19 pandemic Lungs: Clear throughout to auscultation Heart: Regular rate and rhythm; no murmurs, rubs or bruits Abdomen: Soft, non tender and non distended. No masses, hepatosplenomegaly or hernias noted. Normal Bowel sounds Rectal: Deferred to colonoscopy Musculoskeletal: Symmetrical with no gross deformities  Pulses:  Normal pulses noted Extremities: No clubbing, cyanosis, edema or deformities noted Neurological: Alert oriented x 4, grossly nonfocal Psychological:  Alert and cooperative. Normal mood and affect   Assessment and Recommendations:  1.  Personal history of multiple (10) tubular adenomatous colon polyps at last colonoscopy.  A 2-year interval surveillance colonoscopy was recommended.  Schedule colonoscopy. The risks (including bleeding, perforation, infection, missed lesions, medication reactions and possible hospitalization or surgery if complications occur), benefits, and alternatives to colonoscopy with possible biopsy and possible polypectomy were discussed with the patient and they consent to proceed.   2. CAD with stent, history of CVA, AAA repair. Hold Plavix 5 days before procedure - will instruct when and how to resume after procedure. Low but real risk of cardiovascular event such as heart attack, stroke, embolism,  thrombosis or ischemia/infarct of other organs off Plavix explained and need to seek urgent help if this occurs. The patient consents to proceed. Will communicate by phone or EMR with patient's prescribing provider to confirm that holding Plavix is reasonable in this case.

## 2020-10-01 MED ORDER — NA SULFATE-K SULFATE-MG SULF 17.5-3.13-1.6 GM/177ML PO SOLN: 1.0000 | Freq: Once | ORAL | 0 refills | Status: AC

## 2020-10-01 NOTE — Addendum Note (Signed)
Addended by: Dorisann Frames L on: 10/01/2020 04:38 PM   Modules accepted: Orders

## 2020-10-06 NOTE — Telephone Encounter (Signed)
Informed patient that he can hold his plavix 5 days prior to his procedure. Patient verbalized understanding.

## 2020-10-06 NOTE — Telephone Encounter (Signed)
Per Cristie Hem at Dr. Oneida Alar ([facility]) per Dr. Oneida Alar patient can hold Plavix for 5 days prior to his procedure.

## 2020-10-06 NOTE — Telephone Encounter (Signed)
Left message with Dr. Oneida Alar office asking if patient can come off Plavix prior to procedure on 10/22/20.

## 2020-10-20 ENCOUNTER — Telehealth: Payer: Self-pay

## 2020-10-20 NOTE — Chronic Care Management (AMB) (Signed)
Chronic Care Management Pharmacy Assistant   Name: Alfred [name]  MRN: [id number] DOB: 29-Nov-1950  Reason for Encounter: Hypertension Disease State Call  Recent office visits:  07/25/20- Alfred Chyle, MD- seen for cervical radiculopathy, started cyclobenzaprine 10 mg tid prn, started gabapentin gabapentin 300 mg at bedtime, short course prednisone 50 x5 DS   Recent consult visits:  09/30/20- Alfred Artist, MD (Gastroenterology)- seen for follow-up of multiple adenomatous colon polyps, colonoscopy scheduled, no medication changes,  no follow up documented  Hospital visits:  None in previous 6 months  Medications: Outpatient Encounter Medications as of 10/20/2020  Medication Sig  . acetaminophen (TYLENOL) 500 MG tablet Take 500 mg by mouth every 6 (six) hours as needed for mild pain. PRN for pain/fever  . aspirin 81 MG tablet Take 81 mg by mouth at bedtime.  Marland Kitchen atorvastatin (LIPITOR) 10 MG tablet TAKE 1 TABLET BY MOUTH DAILY AT 6 PM. PT NEEDS APPOINTMENT FOR REFILLS. PLEASE CALL [phone].  . clopidogrel (PLAVIX) 75 MG tablet TAKE 1 TABLET BY MOUTH EVERY DAY  . cyclobenzaprine (FLEXERIL) 10 MG tablet Take 1 tablet (10 mg total) by mouth 3 (three) times daily as needed for muscle spasms.  . metoprolol succinate (TOPROL-XL) 50 MG 24 hr tablet TAKE 1 TABLET (50 MG TOTAL) BY MOUTH DAILY. TAKE WITH OR IMMEDIATELY FOLLOWING A MEAL.  . valsartan (DIOVAN) 160 MG tablet TAKE 1 TABLET BY MOUTH EVERY DAY   No facility-administered encounter medications on file as of 10/20/2020.    Reviewed chart prior to disease state call. Spoke with patient regarding BP  Recent Office Vitals: BP Readings from Last 3 Encounters:  09/30/20 136/80  07/25/20 (!) 181/96  02/12/20 126/78   Pulse Readings from Last 3 Encounters:  09/30/20 (!) 56  07/25/20 65  02/12/20 69    Wt Readings from Last 3 Encounters:  09/30/20 (!) 301 lb (136.5 kg)  07/25/20 293 lb 9.6 oz (133.2 kg)  05/09/20 273 lb  (123.8 kg)     Kidney Function Lab Results  Component Value Date/Time   CREATININE 0.83 01/28/2020 09:13 AM   CREATININE 1.07 01/23/2019 02:32 PM   CREATININE 1.02 01/09/2019 09:56 AM   CREATININE 1.07 01/07/2016 08:14 AM   GFR 66.92 03/29/2014 08:26 AM   GFRNONAA 71 01/23/2019 02:32 PM   GFRAA 83 01/23/2019 02:32 PM    BMP Latest Ref Rng & Units 01/28/2020 01/23/2019 01/09/2019  Glucose 65 - 99 mg/dL 106(H) 93 106(H)  BUN 7 - 25 mg/dL 23 22 17   Creatinine 0.70 - 1.25 mg/dL 0.83 1.07 1.02  BUN/Creat Ratio 6 - 22 (calc) NOT APPLICABLE 21 17  Sodium 135 - 146 mmol/L 138 141 137  Potassium 3.5 - 5.3 mmol/L 4.4 4.4 4.8  Chloride 98 - 110 mmol/L 102 103 100  CO2 20 - 32 mmol/L 29 24 23   Calcium 8.6 - 10.3 mg/dL 9.2 9.4 9.4   I spoke with Alfred [name] today and he is doing well. He was tending to shrubbery when I called so we couldn't talk much. He is a very active and self sustained individual. 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 He works for about 11- 13 hours throughout the day with numbness in his left arm due to his lumbar radiculopathy, but says at his age "that's the best thing he could hope for". He is humble with the things he endures. Tomorrow is his colonoscopy and therefore he has not  eaten anything sustainable since yesterday. He is preparing for this procedure in his best spirits and will be waiting to eat until after 05/25 around 4 pm. Otherwise  Alfred [name]  has no complaints or concerns for his health or medications.   Current antihypertensive regimen:  Metoprolol 50 mg XR once daily Valsartan 160 mg daily  How often are you checking your Blood Pressure? Patient stated he does not check his BP due to the fact that he is always busy and he feels fine. He says that his BP is well controlled when he is under 300 lbs and takes his medications. He denies any weakness or ill feelings.  Current home BP readings: None provided  What recent  interventions/DTPs have been made by any provider to improve Blood Pressure control since last CPP Visit:  No recent interventions  Any recent hospitalizations or ED visits since last visit with CPP? No  What diet changes have been made to improve Blood Pressure Control?  Patient stated he has no specific diet. Sometimes he does not eat or cuts back in order to stay under 300 lbs  What exercise is being done to improve your Blood Pressure Control?  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 Yes Does the patient have >5 day gap between last estimated fill dates? No Metoprolol 50 mg- 90 DS last filled 08/03/20  Star Rating Drugs: Valsartan 160 mg- 90 DS last filled 09/01/20 Atorvastatin 10 mg- 90 DS last filled 10/20/20  Wilford Sports CPA, CMA

## 2020-10-22 ENCOUNTER — Other Ambulatory Visit: Payer: Self-pay

## 2020-10-22 ENCOUNTER — Ambulatory Visit (AMBULATORY_SURGERY_CENTER): Payer: PPO | Admitting: Gastroenterology

## 2020-10-22 ENCOUNTER — Encounter: Payer: Self-pay | Admitting: Gastroenterology

## 2020-10-22 VITALS — BP 112/58 | HR 60 | Temp 97.3°F | Resp 12 | Ht 72.0 in | Wt 301.0 lb

## 2020-10-22 DIAGNOSIS — D120 Benign neoplasm of cecum: Secondary | ICD-10-CM

## 2020-10-22 DIAGNOSIS — D124 Benign neoplasm of descending colon: Secondary | ICD-10-CM

## 2020-10-22 DIAGNOSIS — D122 Benign neoplasm of ascending colon: Secondary | ICD-10-CM

## 2020-10-22 DIAGNOSIS — K921 Melena: Secondary | ICD-10-CM | POA: Diagnosis not present

## 2020-10-22 DIAGNOSIS — Z86010 Personal history of colonic polyps: Secondary | ICD-10-CM | POA: Diagnosis not present

## 2020-10-22 DIAGNOSIS — D123 Benign neoplasm of transverse colon: Secondary | ICD-10-CM

## 2020-10-22 MED ORDER — SODIUM CHLORIDE 0.9 % IV SOLN: 500.0000 mL | Freq: Once | INTRAVENOUS | Status: DC

## 2020-10-22 NOTE — Patient Instructions (Signed)
YOU HAD AN ENDOSCOPIC PROCEDURE TODAY AT [facility]:   Refer to the procedure report that was given to you for any specific questions about what was found during the examination.  If the procedure report does not answer your questions, please call your gastroenterologist to clarify.  If you requested that your care partner not be given the details of your procedure findings, then the procedure report has been included in a sealed envelope for you to review at your convenience later.  YOU SHOULD EXPECT: Some feelings of bloating in the abdomen. Passage of more gas than usual.  Walking can help get rid of the air that was put into your GI tract during the procedure and reduce the bloating. If you had a lower endoscopy (such as a colonoscopy or flexible sigmoidoscopy) you may notice spotting of blood in your stool or on the toilet paper. If you underwent a bowel prep for your procedure, you may not have a normal bowel movement for a few days.  Please Note:  You might notice some irritation and congestion in your nose or some drainage.  This is from the oxygen used during your procedure.  There is no need for concern and it should clear up in a day or so.  SYMPTOMS TO REPORT IMMEDIATELY:   Following lower endoscopy (colonoscopy or flexible sigmoidoscopy):  Excessive amounts of blood in the stool  Significant tenderness or worsening of abdominal pains  Swelling of the abdomen that is new, acute  Fever of 100F or higher    For urgent or emergent issues, a gastroenterologist can be reached at any hour by calling [phone]. Do not use MyChart messaging for urgent concerns.    DIET:  We do recommend a small meal at first, but then you may proceed to your regular diet.  Drink plenty of fluids but you should avoid alcoholic beverages for 24 hours.  ACTIVITY:  You should plan to take it easy for the rest of today and you should NOT DRIVE or use heavy machinery until tomorrow  (because of the sedation medicines used during the test).    FOLLOW UP: Our staff will call the number listed on your records 48-72 hours following your procedure to check on you and address any questions or concerns that you may have regarding the information given to you following your procedure. If we do not reach you, we will leave a message.  We will attempt to reach you two times.  During this call, we will ask if you have developed any symptoms of COVID 19. If you develop any symptoms (ie: fever, flu-like symptoms, shortness of breath, cough etc.) before then, please call [phone].  If you test positive for Covid 19 in the 2 weeks post procedure, please call and report this information to [place].    If any biopsies were taken you will be contacted by phone or by letter within the next 1-3 weeks.  Please call us at [phone] if you have not heard about the biopsies in 3 weeks.    SIGNATURES/CONFIDENTIALITY: You and/or your care partner have signed paperwork which will be entered into your electronic medical record.  These signatures attest to the fact that that the information above on your After Visit Summary has been reviewed and is understood.  Full responsibility of the confidentiality of this discharge information lies with you and/or your care-partner.  Resume PLAVIX IN 2 DAYS AND REMAINDER OF MEDICATION TODAY. INFORMATION GIVEN ON POLYPS,DIVERTICULOSIS AND HIGH  FIBER DIET.

## 2020-10-22 NOTE — Progress Notes (Signed)
To PACU, VSS. Report to [email or web address] 

## 2020-10-22 NOTE — Op Note (Signed)
[facility] Patient Name: Alfred [name] Procedure Date: 10/22/2020 2:01 PM MRN: [id number] Endoscopist: Ladene Artist , MD Age: 70 Referring MD:  Date of Birth: 07-14-1950 Gender: Male Account #: [id number] Procedure:                Colonoscopy Indications:              Surveillance: History of numerous (> 10) adenomas                            on last colonoscopy (< 3 yrs) Medicines:                Monitored Anesthesia Care Procedure:                Pre-Anesthesia Assessment:                           - Prior to the procedure, a History and Physical                            was performed, and patient medications and                            allergies were reviewed. The patient's tolerance of                            previous anesthesia was also reviewed. The risks                            and benefits of the procedure and the sedation                            options and risks were discussed with the patient.                            All questions were answered, and informed consent                            was obtained. Prior Anticoagulants: The patient has                            taken Plavix (clopidogrel), last dose was 5 days                            prior to procedure. ASA Grade Assessment: III - A                            patient with severe systemic disease. After                            reviewing the risks and benefits, the patient was                            deemed in satisfactory condition to undergo the  procedure.                           After obtaining informed consent, the colonoscope                            was passed under direct vision. Throughout the                            procedure, the patient's blood pressure, pulse, and                            oxygen saturations were monitored continuously. The                            Olympus CF-HQ190L ([id number]) Colonoscope was                             introduced through the anus and advanced to the the                            cecum, identified by appendiceal orifice and                            ileocecal valve. The ileocecal valve, appendiceal                            orifice, and rectum were photographed. The quality                            of the bowel preparation was adequate. The                            colonoscopy was performed without difficulty. The                            patient tolerated the procedure well. Scope In: 2:11:43 PM Scope Out: 2:30:16 PM Scope Withdrawal Time: 0 hours 16 minutes 18 seconds  Total Procedure Duration: 0 hours 18 minutes 33 seconds  Findings:                 The perianal and digital rectal examinations were                            normal.                           Six sessile polyps were found in the descending                            colon (1), transverse colon (3), ascending colon                            (1) and cecum (1). The polyps were 5 to 9 mm in  size. These polyps were removed with a cold snare.                            Resection and retrieval were complete.                           A few small-mouthed diverticula were found in the                            left colon.                           Internal hemorrhoids were found during                            retroflexion. The hemorrhoids were small and Grade                            I (internal hemorrhoids that do not prolapse).                           The exam was otherwise without abnormality on                            direct and retroflexion views. Complications:            No immediate complications. Estimated blood loss:                            None. Estimated Blood Loss:     Estimated blood loss: none. Impression:               - Six 5 to 9 mm polyps in the descending colon, in                            the transverse colon, in the ascending colon and in                             the cecum, removed with a cold snare. Resected and                            retrieved.                           - Mild diverticulosis in the left colon.                           - Internal hemorrhoids.                           - The examination was otherwise normal on direct                            and retroflexion views. Recommendation:           - Repeat colonoscopy, likely in 3 years, for  surveillance based on pathology results.                           - Resume Plavix (clopidogrel) in 2 days at prior                            dose. Refer to managing physician for further                            adjustment of therapy.                           - Patient has a contact number available for                            emergencies. The signs and symptoms of potential                            delayed complications were discussed with the                            patient. Return to normal activities tomorrow.                            Written discharge instructions were provided to the                            patient.                           - High fiber diet.                           - Continue present medications.                           - Await pathology results. Ladene Artist, MD 10/22/2020 2:38:07 PM This report has been signed electronically.

## 2020-10-22 NOTE — Progress Notes (Signed)
Pt's states no medical or surgical changes since previsit or office visit. 

## 2020-10-22 NOTE — Progress Notes (Signed)
Called to room to assist during endoscopic procedure.  Patient ID and intended procedure confirmed with present staff. Received instructions for my participation in the procedure from the performing physician.  

## 2020-10-24 ENCOUNTER — Telehealth: Payer: Self-pay | Admitting: *Deleted

## 2020-10-24 NOTE — Telephone Encounter (Signed)
  Follow up Call-  Call back number 10/22/2020 04/05/2018  Post procedure Call Back phone  # [phone]  Permission to leave phone message Yes Yes  Some recent data might be hidden     Patient questions:  Do you have a fever, pain , or abdominal swelling? No. Pain Score  0 *  Have you tolerated food without any problems? Yes.    Have you been able to return to your normal activities? Yes.    Do you have any questions about your discharge instructions: Diet   No. Medications  No. Follow up visit  No.  Do you have questions or concerns about your Care? No.  Actions: * If pain score is 4 or above: No action needed, pain <4

## 2020-11-06 ENCOUNTER — Encounter: Payer: Self-pay | Admitting: Gastroenterology

## 2020-11-13 ENCOUNTER — Telehealth: Payer: Self-pay

## 2020-11-13 NOTE — Telephone Encounter (Signed)
Per patient request at last visit, called the patient to arrange 1 year visit with DOT stress test prior.  Arranged office visit with Dr. Burt Knack 04/27/21. Alfred [name] will speak to DOT doctor and see if he needs stress test and will contact the office if so. He was grateful for call and agrees with plan.

## 2020-11-15 ENCOUNTER — Other Ambulatory Visit: Payer: Self-pay | Admitting: Cardiovascular Disease

## 2020-11-15 DIAGNOSIS — I2510 Atherosclerotic heart disease of native coronary artery without angina pectoris: Secondary | ICD-10-CM

## 2021-01-27 ENCOUNTER — Other Ambulatory Visit: Payer: Self-pay

## 2021-01-27 ENCOUNTER — Ambulatory Visit (INDEPENDENT_AMBULATORY_CARE_PROVIDER_SITE_OTHER): Payer: PPO | Admitting: Family Medicine

## 2021-01-27 ENCOUNTER — Encounter: Payer: Self-pay | Admitting: Family Medicine

## 2021-01-27 VITALS — BP 146/84 | HR 60 | Temp 97.9°F | Ht 72.0 in | Wt 306.0 lb

## 2021-01-27 DIAGNOSIS — E669 Obesity, unspecified: Secondary | ICD-10-CM

## 2021-01-27 DIAGNOSIS — I10 Essential (primary) hypertension: Secondary | ICD-10-CM

## 2021-01-27 DIAGNOSIS — Z0001 Encounter for general adult medical examination with abnormal findings: Secondary | ICD-10-CM | POA: Diagnosis not present

## 2021-01-27 DIAGNOSIS — Z9889 Other specified postprocedural states: Secondary | ICD-10-CM

## 2021-01-27 DIAGNOSIS — Z6841 Body Mass Index (BMI) 40.0 and over, adult: Secondary | ICD-10-CM | POA: Diagnosis not present

## 2021-01-27 DIAGNOSIS — Z23 Encounter for immunization: Secondary | ICD-10-CM

## 2021-01-27 DIAGNOSIS — G4730 Sleep apnea, unspecified: Secondary | ICD-10-CM | POA: Diagnosis not present

## 2021-01-27 DIAGNOSIS — E785 Hyperlipidemia, unspecified: Secondary | ICD-10-CM

## 2021-01-27 DIAGNOSIS — R739 Hyperglycemia, unspecified: Secondary | ICD-10-CM | POA: Diagnosis not present

## 2021-01-27 LAB — LIPID PANEL
Cholesterol: 127 mg/dL (ref 0–200)
HDL: 44.4 mg/dL (ref >39.00)
LDL Cholesterol: 60 mg/dL (ref 0–99)
NonHDL: 82.93
Total CHOL/HDL Ratio: 3
Triglycerides: 113 mg/dL (ref 0.0–149.0)
VLDL: 22.6 mg/dL (ref 0.0–40.0)

## 2021-01-27 LAB — CBC
HCT: 45.3 % (ref 39.0–52.0)
Hemoglobin: 15.3 g/dL (ref 13.0–17.0)
MCHC: 33.8 g/dL (ref 30.0–36.0)
MCV: 101.2 fl — ABNORMAL HIGH (ref 78.0–100.0)
Platelets: 172 10*3/uL (ref 150.0–400.0)
RBC: 4.48 Mil/uL (ref 4.22–5.81)
RDW: 12.9 % (ref 11.5–15.5)
WBC: 5.3 10*3/uL (ref 4.0–10.5)

## 2021-01-27 LAB — COMPREHENSIVE METABOLIC PANEL
ALT: 53 U/L (ref 0–53)
AST: 41 U/L — ABNORMAL HIGH (ref 0–37)
Albumin: 4 g/dL (ref 3.5–5.2)
Alkaline Phosphatase: 87 U/L (ref 39–117)
BUN: 24 mg/dL — ABNORMAL HIGH (ref 6–23)
CO2: 27 mEq/L (ref 19–32)
Calcium: 9.4 mg/dL (ref 8.4–10.5)
Chloride: 103 mEq/L (ref 96–112)
Creatinine, Ser: 1.06 mg/dL (ref 0.40–1.50)
GFR: 71.43 mL/min (ref >60.00)
Glucose, Bld: 93 mg/dL (ref 70–99)
Potassium: 4.7 mEq/L (ref 3.5–5.1)
Sodium: 137 mEq/L (ref 135–145)
Total Bilirubin: 0.7 mg/dL (ref 0.2–1.2)
Total Protein: 7 g/dL (ref 6.0–8.3)

## 2021-01-27 LAB — TSH: TSH: 2.59 u[IU]/mL (ref 0.35–5.50)

## 2021-01-27 LAB — HEMOGLOBIN A1C: Hgb A1c MFr Bld: 5.6 % (ref 4.6–6.5)

## 2021-01-27 NOTE — Assessment & Plan Note (Addendum)
Occasionally snores.  He has been feeling more tired throughout the day.  He has not dozed off.  We discussed warning for sleep apnea.  We will order home sleep study test.

## 2021-01-27 NOTE — Patient Instructions (Signed)
It was very nice to see you today!  We will check blood work today.  We will give your pneumonia vaccine today also.  I will see if we can get she set up for a home sleep study.  I will see back in year for your next physical.  Come back to see me sooner if needed.  Take care, Dr Jerline Pain  PLEASE NOTE:  If you had any lab tests please let us know if you have not heard back within a few days. You may see your results on mychart before we have a chance to review them but we will give you a call once they are reviewed by [place]. If we ordered any referrals today, please let us know if you have not heard from their office within the next week.   Please try these tips to maintain a healthy lifestyle:  Eat at least 3 REAL meals and 1-2 snacks per day.  Aim for no more than 5 hours between eating.  If you eat breakfast, please do so within one hour of getting up.   Each meal should contain half fruits/vegetables, one quarter protein, and one quarter carbs (no bigger than a computer mouse)  Cut down on sweet beverages. This includes juice, soda, and sweet tea.   Drink at least 1 glass of water with each meal and aim for at least 8 glasses per day  Exercise at least 150 minutes every week.    Preventive Care 70 Years and Older, Male Preventive care refers to lifestyle choices and visits with your health care provider that can promote health and wellness. This includes: A yearly physical exam. This is also called an annual wellness visit. Regular dental and eye exams. Immunizations. Screening for certain conditions. Healthy lifestyle choices, such as: Eating a healthy diet. Getting regular exercise. Not using drugs or products that contain nicotine and tobacco. Limiting alcohol use. What can I expect for my preventive care visit? Physical exam Your health care provider will check your: Height and weight. These may be used to calculate your BMI (body mass index). BMI is a measurement that  tells if you are at a healthy weight. Heart rate and blood pressure. Body temperature. Skin for abnormal spots. Counseling Your health care provider may ask you questions about your: Past medical problems. Family's medical history. Alcohol, tobacco, and drug use. Emotional well-being. Home life and relationship well-being. Sexual activity. Diet, exercise, and sleep habits. History of falls. Memory and ability to understand (cognition). Work and work Statistician. Access to firearms. What immunizations do I need?  Vaccines are usually given at various ages, according to a schedule. Your health care provider will recommend vaccines for you based on your age, medicalhistory, and lifestyle or other factors, such as travel or where you work. What tests do I need? Blood tests Lipid and cholesterol levels. These may be checked every 5 years, or more often depending on your overall health. Hepatitis C test. Hepatitis B test. Screening Lung cancer screening. You may have this screening every year starting at age 23 if you have a 30-pack-year history of smoking and currently smoke or have quit within the past 15 years. Colorectal cancer screening. All adults should have this screening starting at age 75 and continuing until age 87. Your health care provider may recommend screening at age 108 if you are at increased risk. You will have tests every 1-10 years, depending on your results and the type of screening test. Prostate cancer screening. Recommendations  will vary depending on your family history and other risks. Genital exam to check for testicular cancer or hernias. Diabetes screening. This is done by checking your blood sugar (glucose) after you have not eaten for a while (fasting). You may have this done every 1-3 years. Abdominal aortic aneurysm (AAA) screening. You may need this if you are a current or former smoker. STD (sexually transmitted disease) testing, if you are at  risk. Follow these instructions at home: Eating and drinking  Eat a diet that includes fresh fruits and vegetables, whole grains, lean protein, and low-fat dairy products. Limit your intake of foods with high amounts of sugar, saturated fats, and salt. Take vitamin and mineral supplements as recommended by your health care provider. Do not drink alcohol if your health care provider tells you not to drink. If you drink alcohol: Limit how much you have to 0-2 drinks a day. Be aware of how much alcohol is in your drink. In the [place] one drink equals one 12 oz bottle of beer (355 mL), one 5 oz glass of wine (148 mL), or one 1 oz glass of hard liquor (44 mL).  Lifestyle Take daily care of your teeth and gums. Brush your teeth every morning and night with fluoride toothpaste. Floss one time each day. Stay active. Exercise for at least 30 minutes 5 or more days each week. Do not use any products that contain nicotine or tobacco, such as cigarettes, e-cigarettes, and chewing tobacco. If you need help quitting, ask your health care provider. Do not use drugs. If you are sexually active, practice safe sex. Use a condom or other form of protection to prevent STIs (sexually transmitted infections). Talk with your health care provider about taking a low-dose aspirin or statin. Find healthy ways to cope with stress, such as: Meditation, yoga, or listening to music. Journaling. Talking to a trusted person. Spending time with friends and family. Safety Always wear your seat belt while driving or riding in a vehicle. Do not drive: If you have been drinking alcohol. Do not ride with someone who has been drinking. When you are tired or distracted. While texting. Wear a helmet and other protective equipment during sports activities. If you have firearms in your house, make sure you follow all gun safety procedures. What's next? Visit your health care provider once a year for an annual wellness  visit. Ask your health care provider how often you should have your eyes and teeth checked. Stay up to date on all vaccines. This information is not intended to replace advice given to you by your health care provider. Make sure you discuss any questions you have with your healthcare provider. Document Revised: 02/13/2019 Document Reviewed: 05/11/2018 Elsevier Patient Education  2022 [employer].

## 2021-01-27 NOTE — Assessment & Plan Note (Signed)
At goal per JNC 8 though typically better controlled than today.  We discussed lifestyle modifications.  Continue current regimen metoprolol succinate 50 mg daily and valsartan 160 mg daily.  Continue home monitoring.  He will let me know if persistently elevated.  We will check labs today.

## 2021-01-27 NOTE — Assessment & Plan Note (Signed)
Check labs.  He is on Lipitor 10 mg daily.

## 2021-01-27 NOTE — Assessment & Plan Note (Signed)
Follows with vascular surgery.  Has yearly surveillance.

## 2021-01-27 NOTE — Assessment & Plan Note (Signed)
Check A1c. 

## 2021-01-27 NOTE — Progress Notes (Signed)
Chief Complaint:  Alfred [name] is a 70 y.o. male who presents today for his annual comprehensive physical exam.    Assessment/Plan:  Chronic Problems Addressed Today: History of AAA (abdominal aortic aneurysm) repair Follows with vascular surgery.  Has yearly surveillance.  HYPERTENSION, BENIGN At goal per JNC 8 though typically better controlled than today.  We discussed lifestyle modifications.  Continue current regimen metoprolol succinate 50 mg daily and valsartan 160 mg daily.  Continue home monitoring.  He will let me know if persistently elevated.  We will check labs today.  Hyperlipidemia Check labs.  He is on Lipitor 10 mg daily.  Hyperglycemia Check A1c.  Sleep-disordered breathing Occasionally snores.  He has been feeling more tired throughout the day.  He has not dozed off.  We discussed warning for sleep apnea.  We will order home sleep study test.   Body mass index is 41.5 kg/m. / Obese  BMI Metric Follow Up - 01/27/21 1225       BMI Metric Follow Up-Please document annually   BMI Metric Follow Up Education provided             Preventative Healthcare: UTD on vaccines. Will get blood work today and pneumonia vaccine. Check labs  Patient Counseling(The following topics were reviewed and/or handout was given):  -Nutrition: Stressed importance of moderation in sodium/caffeine intake, saturated fat and cholesterol, caloric balance, sufficient intake of fresh fruits, vegetables, and fiber.  -Stressed the importance of regular exercise.   -Substance Abuse: Discussed cessation/primary prevention of tobacco, alcohol, or other drug use; driving or other dangerous activities under the influence; availability of treatment for abuse.   -Injury prevention: Discussed safety belts, safety helmets, smoke detector, smoking near bedding or upholstery.   -Sexuality: Discussed sexually transmitted diseases, partner selection, use of condoms, avoidance of unintended pregnancy  and contraceptive alternatives.   -Dental health: Discussed importance of regular tooth brushing, flossing, and dental visits.  -Health maintenance and immunizations reviewed. Please refer to Health maintenance section.  Return to care in 1 year for next preventative visit.     Subjective:  HPI:  He has no acute complaints today.   See A/P for status of chronic conditions.  Lifestyle Diet: Balanced.  Exercise: None specific  Depression screen PHQ 2/9 01/27/2021  Decreased Interest 0  Down, Depressed, Hopeless 0  PHQ - 2 Score 0    Health Maintenance Due  Topic Date Due   Hepatitis C Screening  Never done   TETANUS/TDAP  Never done   COVID-19 Vaccine (4 - Booster for Pfizer series) 08/27/2020   INFLUENZA VACCINE  12/29/2020     ROS: Per HPI, otherwise a complete review of systems was negative.   PMH:  The following were reviewed and entered/updated in epic: Past Medical History:  Diagnosis Date   AAA (abdominal aortic aneurysm) ([place]) 08/2011   see Surgical history   Anginal pain ([place])    Arthritis    "knees; not bad"   CAD (coronary artery disease)    DES to RCA and CFX; Dr. Burt Knack cardiologist   Depression ~ 2009   'treated for ~ 6 months"   Dyslipidemia    HTN (hypertension)    unspecified essential   Hx of adenomatous colonic polyps 2008   Hyperplasia of prostate without lower urinary tract symptoms (LUTS)    Left lumbar radiculopathy    Other and unspecified hyperlipidemia    Stroke [facility])    Patient Active Problem List   Diagnosis Date Noted  Hyperglycemia 01/27/2021   Sleep-disordered breathing 01/27/2021   Actinic keratosis 01/28/2020   CAD (coronary artery disease)    HYPERTENSION, BENIGN 05/02/2009   History of AAA (abdominal aortic aneurysm) repair 11/05/2008   COLONIC POLYPS, ADENOMATOUS 03/28/2008   Hyperlipidemia 03/28/2008   LUMBAR RADICULOPATHY, LEFT 03/28/2008   Past Surgical History:  Procedure Laterality Date   ABDOMINAL AORTIC  ANEURYSM REPAIR  09/06/2011   ABDOMINAL AORTIC ANEURYSM REPAIR  01/21/2012   Procedure: ANEURYSM ABDOMINAL AORTIC REPAIR;  Surgeon: Elam Dutch, MD;  [facility];  Service: Vascular;  Laterality: N/A;  Aortio- bi-iliac repair  using Cryolife descending thoracic aorta 31m x10.5 cm.   APPENDECTOMY  1970   ARTERY REPAIR  09/06/2011   Procedure: ARTERY REPAIR;  Surgeon: CElam Dutch MD;  [facility];  Service: Vascular;  Laterality: Bilateral;   COLONOSCOPY W/ POLYPECTOMY  2008   adenomatous  & hyperplastic polyp;due 2013   CORONARY ANGIOPLASTY WITH STENT PLACEMENT  2002; 2005   "2 +1; total of 3"   CYSTOSCOPY  2005   varicose veins in bladder, Dr dDiona Fanti  TONSILLECTOMY AND ADENOIDECTOMY  1954   VASECTOMY      Family History  Problem Relation Age of Onset   Coronary artery disease Father        CABG in 651s  Diabetes Father    Colon polyps Father    Lung cancer Father        non-small cell carcinoma/bone CA   Hypertension Father    Coronary artery disease Maternal Grandfather    Coronary artery disease Paternal Grandfather    Other Mother        varicose veins   Anesthesia problems Neg Hx    Stroke Neg Hx    Colon cancer Neg Hx    Esophageal cancer Neg Hx    Rectal cancer Neg Hx    Stomach cancer Neg Hx     Medications- reviewed and updated Current Outpatient Medications  Medication Sig Dispense Refill   acetaminophen (TYLENOL) 500 MG tablet Take 500 mg by mouth every 6 (six) hours as needed for mild pain. PRN for pain/fever     aspirin 81 MG tablet Take 81 mg by mouth at bedtime.     atorvastatin (LIPITOR) 10 MG tablet TAKE 1 TABLET BY MOUTH DAILY AT 6 PM. PT NEEDS APPOINTMENT FOR REFILLS. PLEASE CALL [phone] 90 tablet 3   clopidogrel (PLAVIX) 75 MG tablet TAKE 1 TABLET BY MOUTH EVERY DAY 90 tablet 1   metoprolol succinate (TOPROL-XL) 50 MG 24 hr tablet TAKE 1 TABLET (50 MG TOTAL) BY MOUTH DAILY. TAKE WITH OR IMMEDIATELY FOLLOWING A MEAL. 90 tablet 3    valsartan (DIOVAN) 160 MG tablet TAKE 1 TABLET BY MOUTH EVERY DAY 90 tablet 1   No current facility-administered medications for this visit.    Allergies-reviewed and updated No Known Allergies  Social History   Socioeconomic History   Marital status: Married    Spouse name: Not on file   Number of children: Not on file   Years of education: Not on file   Highest education level: Not on file  Occupational History   Occupation: Farming  Tobacco Use   Smoking status: Former    Packs/day: 1.00    Years: 27.00    Pack years: 27.00    Types: Cigarettes    Quit date: 05/31/1993    Years since quitting: 27.6   Smokeless tobacco: Current    Types: Snuff  Tobacco comments:    smoked 1968-1995, up to 1 ppd. Currently using Skoal 1 can / day  Vaping Use   Vaping Use: Never used  Substance and Sexual Activity   Alcohol use: Yes    Alcohol/week: 21.0 standard drinks    Types: 21 Shots of liquor per week    Comment: 2-3 drinks of Vodka a night, occasional beer   Drug use: No   Sexual activity: Yes  Other Topics Concern   Not on file  Social History Narrative   Not on file   Social Determinants of Health   Financial Resource Strain: Low Risk    Difficulty of Paying Living Expenses: Not hard at all  Food Insecurity: No Food Insecurity   Worried About Charity fundraiser in the Last Year: Never true   [place] in the Last Year: Never true  Transportation Needs: No Transportation Needs   Lack of Transportation (Medical): No   Lack of Transportation (Non-Medical): No  Physical Activity: Inactive   Days of Exercise per Week: 0 days   Minutes of Exercise per Session: 0 min  Stress: No Stress Concern Present   Feeling of Stress : Not at all  Social Connections: Moderately Integrated   Frequency of Communication with Friends and Family: More than three times a week   Frequency of Social Gatherings with Friends and Family: More than three times a week   Attends Religious  Services: More than 4 times per year   Active Member of [employer] or Organizations: No   Attends Archivist Meetings: Never   Marital Status: Married        Objective:  Physical Exam: BP (!) 146/84   Pulse 60   Temp 97.9 F (36.6 C) (Temporal)   Ht 6' (1.829 m)   Wt (!) 306 lb (138.8 kg)   SpO2 97%   BMI 41.50 kg/m   Body mass index is 41.5 kg/m. Wt Readings from Last 3 Encounters:  01/27/21 (!) 306 lb (138.8 kg)  10/22/20 (!) 301 lb (136.5 kg)  09/30/20 (!) 301 lb (136.5 kg)   Gen: NAD, resting comfortably HEENT: TMs normal bilaterally. OP clear. No thyromegaly noted.  CV: RRR with no murmurs appreciated Pulm: NWOB, CTAB with no crackles, wheezes, or rhonchi GI: Normal bowel sounds present. Soft, Nontender, Nondistended. MSK: no edema, cyanosis, or clubbing noted Skin: warm, dry Neuro: CN2-12 grossly intact. Strength 5/5 in upper and lower extremities. Reflexes symmetric and intact bilaterally.  Psych: Normal affect and thought content      I,Savera Zaman,acting as a scribe for Dimas Chyle, MD.,have documented all relevant documentation on the behalf of Dimas Chyle, MD,as directed by  Dimas Chyle, MD while in the presence of Dimas Chyle, MD.  I, Dimas Chyle, MD, have reviewed all documentation for this visit. The documentation on 01/27/21 for the exam, diagnosis, procedures, and orders are all accurate and complete.  Algis Greenhouse. Jerline Pain, MD 01/27/2021 12:25 PM

## 2021-01-28 ENCOUNTER — Encounter: Payer: PPO | Admitting: Family Medicine

## 2021-01-28 NOTE — Progress Notes (Signed)
Please inform patient of the following:  Labs are all stable.  Do not need to make any changes to treatment plan at this time.  He should continue working on diet and exercise and we can recheck in a year.

## 2021-02-02 NOTE — Progress Notes (Deleted)
Progress Note   4 years post-op removal of for Excluder AAA graft, and placement of cadaveric aortic graft on 01/21/12. He is working full time. His exercise tolerance is at baseline. He denies any fever or chills. He has noticed a bulge at his umbilicus but no change over the past year. This is more prominent when he stresses his abdomen.     Current Outpatient Medications on File Prior to Visit  Medication Sig Dispense Refill   acetaminophen (TYLENOL) 500 MG tablet Take 500 mg by mouth every 6 (six) hours as needed for mild pain. PRN for pain/fever     aspirin 81 MG tablet Take 81 mg by mouth at bedtime.     atorvastatin (LIPITOR) 10 MG tablet TAKE 1 TABLET BY MOUTH DAILY AT 6 PM. PT NEEDS APPOINTMENT FOR REFILLS. PLEASE CALL [phone]. 90 tablet 3   clopidogrel (PLAVIX) 75 MG tablet TAKE 1 TABLET BY MOUTH EVERY DAY 90 tablet 1   metoprolol succinate (TOPROL-XL) 50 MG 24 hr tablet TAKE 1 TABLET (50 MG TOTAL) BY MOUTH DAILY. TAKE WITH OR IMMEDIATELY FOLLOWING A MEAL. 90 tablet 3   valsartan (DIOVAN) 160 MG tablet TAKE 1 TABLET BY MOUTH EVERY DAY 90 tablet 1   No current facility-administered medications on file prior to visit.     Review of systems: He denies shortness of breath. He denies chest pain.      There were no vitals filed for this visit.    Palp femoral pulses bilaterally Incision is well healed without tenderness to palpation. There is a 2 cm hernia defect periumbilical easily reducible   Data: CT angiogram the abdomen and pelvis was performed today. This shows a patent aorta cadaveric graft with no evidence of aneurysmal degeneration. No perianastomotic disruption. No evidence of infection.   Assessment/Plan:  70 y.o. male is s/p removal of Gore Excluder AAA stent graft, and placement of cadaveric graft on 01/21/12.     He will try to lose some weight at this point.  CTA Abdomen and pelvis in 5 years.  We'll refer to general surgery in the future if he wishes to have  consideration for repair of his umbilical hernia.     Ruta Hinds, MD [facility] Office: [phone] Pager: [phone]

## 2021-02-03 ENCOUNTER — Other Ambulatory Visit: Payer: Self-pay | Admitting: *Deleted

## 2021-02-03 ENCOUNTER — Ambulatory Visit: Payer: PPO | Admitting: Vascular Surgery

## 2021-02-03 DIAGNOSIS — Z9889 Other specified postprocedural states: Secondary | ICD-10-CM

## 2021-02-11 ENCOUNTER — Telehealth: Payer: Self-pay | Admitting: Pharmacist

## 2021-02-11 NOTE — Chronic Care Management (AMB) (Signed)
Chronic Care Management Pharmacy Assistant   Name: Alfred [name]  MRN: [id number] DOB: 1950-07-29   Reason for Encounter: Hypertension Adherence Call    Recent office visits:  01/27/2021 [facility] (PCP) Dimas Chyle, MD; chronic follow up, no medication changes indicated.  Recent consult visits:  None  Hospital visits:  None  Medications: Outpatient Encounter Medications as of 02/11/2021  Medication Sig   acetaminophen (TYLENOL) 500 MG tablet Take 500 mg by mouth every 6 (six) hours as needed for mild pain. PRN for pain/fever   aspirin 81 MG tablet Take 81 mg by mouth at bedtime.   atorvastatin (LIPITOR) 10 MG tablet TAKE 1 TABLET BY MOUTH DAILY AT 6 PM. PT NEEDS APPOINTMENT FOR REFILLS. PLEASE CALL [phone].   clopidogrel (PLAVIX) 75 MG tablet TAKE 1 TABLET BY MOUTH EVERY DAY   metoprolol succinate (TOPROL-XL) 50 MG 24 hr tablet TAKE 1 TABLET (50 MG TOTAL) BY MOUTH DAILY. TAKE WITH OR IMMEDIATELY FOLLOWING A MEAL.   valsartan (DIOVAN) 160 MG tablet TAKE 1 TABLET BY MOUTH EVERY DAY   No facility-administered encounter medications on file as of 02/11/2021.   Reviewed chart prior to disease state call. Spoke with patient regarding BP  Recent Office Vitals: BP Readings from Last 3 Encounters:  01/27/21 (!) 146/84  10/22/20 (!) 112/58  09/30/20 136/80   Pulse Readings from Last 3 Encounters:  01/27/21 60  10/22/20 60  09/30/20 (!) 56    Wt Readings from Last 3 Encounters:  01/27/21 (!) 306 lb (138.8 kg)  10/22/20 (!) 301 lb (136.5 kg)  09/30/20 (!) 301 lb (136.5 kg)     Kidney Function Lab Results  Component Value Date/Time   CREATININE 1.06 01/27/2021 12:06 PM   CREATININE 0.83 01/28/2020 09:13 AM   CREATININE 1.07 01/23/2019 02:32 PM   CREATININE 1.07 01/07/2016 08:14 AM   GFR 71.43 01/27/2021 12:06 PM   GFRNONAA 71 01/23/2019 02:32 PM   GFRAA 83 01/23/2019 02:32 PM    BMP Latest Ref Rng & Units 01/27/2021 01/28/2020 01/23/2019  Glucose 70 - 99 mg/dL 93  106(H) 93  BUN 6 - 23 mg/dL 24(H) 23 22  Creatinine 0.40 - 1.50 mg/dL 1.06 0.83 1.07  BUN/Creat Ratio 6 - 22 (calc) - NOT APPLICABLE 21  Sodium A999333 - 145 mEq/L 137 138 141  Potassium 3.5 - 5.1 mEq/L 4.7 4.4 4.4  Chloride 96 - 112 mEq/L 103 102 103  CO2 19 - 32 mEq/L '27 29 24  '$ Calcium 8.4 - 10.5 mg/dL 9.4 9.2 9.4    Current antihypertensive regimen:  Valsartan 160 mg daily Metoprolol 50 mg daily  How often are you checking your Blood Pressure?   Current home BP readings:   What recent interventions/DTPs have been made by any provider to improve Blood Pressure control since last CPP Visit: No recent interventions or DTPs.  Any recent hospitalizations or ED visits since last visit with CPP? No  What diet changes have been made to improve Blood Pressure Control?    What exercise is being done to improve your Blood Pressure Control?    Adherence Review: Is the patient currently on ACE/ARB medication? Yes Does the patient have >5 day gap between last estimated fill dates? No  **Unsuccessful attempt at reaching patient to complete this call. Chart Review completed.   Care Gaps: Medicare Annual Wellness: last AWV 06/12/2020 Hemoglobin A1C: 5.6% on 01/27/2021 Colonoscopy: Next due 10/23/2023 Zoster Vaccines- Shingrix: Completed COVID-19 Vaccination: (4- Booster for [employer] Series) Overdue PNA Vaccination:  Completed Influenza Vaccination: Overdue since 12/29/2020 Tetanus/DTAP: Overdue - never done HPV Vaccines: Aged Out  Future Appointments  Date Time Provider [place]  03/24/2021 12:40 PM Cherre Robins, MD [facility]  04/27/2021  8:20 AM Sherren Mocha, MD [facility]  06/18/2021  8:00 AM [facility]  01/28/2022  8:20 AM Vivi Barrack, MD [facility]     Star Rating Drugs: Atorvastatin 10 mg last filled 01/17/2021 90 DS Valsartan 160 mg last filled 12/11/2020 90 DS  April D Calhoun, [place] Pharmacist  Assistant [phone]

## 2021-02-16 ENCOUNTER — Other Ambulatory Visit: Payer: Self-pay | Admitting: *Deleted

## 2021-02-16 DIAGNOSIS — Z9889 Other specified postprocedural states: Secondary | ICD-10-CM

## 2021-03-06 ENCOUNTER — Ambulatory Visit
Admission: RE | Admit: 2021-03-06 | Discharge: 2021-03-06 | Disposition: A | Discharge status: Home / Self Care | Payer: PPO | Source: Ambulatory Visit | Attending: Vascular Surgery | Admitting: Vascular Surgery

## 2021-03-06 DIAGNOSIS — K5730 Diverticulosis of large intestine without perforation or abscess without bleeding: Secondary | ICD-10-CM | POA: Diagnosis not present

## 2021-03-06 DIAGNOSIS — Z9889 Other specified postprocedural states: Secondary | ICD-10-CM

## 2021-03-06 MED ORDER — IOPAMIDOL (ISOVUE-370) INJECTION 76%
75.0000 mL | Freq: Once | INTRAVENOUS | Status: AC | PRN
  Administered 2021-03-06: 75 mL via INTRAVENOUS

## 2021-03-06 MED ORDER — IOPAMIDOL (ISOVUE-370) INJECTION 76%: 75.0000 mL | Freq: Once | INTRAVENOUS | Status: DC | PRN

## 2021-03-13 ENCOUNTER — Other Ambulatory Visit: Payer: Self-pay | Admitting: Cardiovascular Disease

## 2021-03-23 NOTE — Progress Notes (Signed)
Good technical result [facility] [place] PROGRESS [facility]  ASSESSMENT / PLAN: Alfred [name] is a 70 y.o. male status post excision of abdominal aortic endograft and replacement with cadaveric graft 01/21/12 by Dr. Oneida Alar. He returns for 5 year total aortic surveillance today.  CTA personally reviewed in detail, and shared with patient.  Open aneurysm repair.  He does have some midline hernias and an umbilical hernia which are mildly bothersome to him, but he does not seek repair for these in the near term.  We will follow-up with [place] in 5 years for repeat CT angiogram of the chest abdomen pelvis.  SUBJECTIVE: Plaints.  We reviewed the natural history of aneurysm disease as well as the rationale for surveillance during most of the visit today.  OBJECTIVE: BP 134/80 (BP Location: Left Arm, Patient Position: Sitting, Cuff Size: Large)   Pulse 63   Temp 98.7 F (37.1 C)   Resp 20   Ht 6' (1.829 m)   Wt (!) 314 lb (142.4 kg)   SpO2 97%   BMI 42.59 kg/m   Constitutional: well appearing. no acute distress. Cardiac: RRR. Pulmonary: unlabored Abdomen: soft abdomen. "Swiss cheese" midline defect about the upper abdomen.  Umbilical hernia.  Otherwise well-healed midline. Vascular: Palpable popliteal pulses bilaterally  CBC Latest Ref Rng & Units 01/27/2021 01/28/2020 09/10/2016  WBC 4.0 - 10.5 K/uL 5.3 4.2 4.3  Hemoglobin 13.0 - 17.0 g/dL 15.3 15.8 15.7  Hematocrit 39.0 - 52.0 % 45.3 45.7 47.1  Platelets 150.0 - 400.0 K/uL 172.0 139(L) 174     CMP Latest Ref Rng & Units 01/27/2021 01/28/2020 01/23/2019  Glucose 70 - 99 mg/dL 93 106(H) 93  BUN 6 - 23 mg/dL 24(H) 23 22  Creatinine 0.40 - 1.50 mg/dL 1.06 0.83 1.07  Sodium 135 - 145 mEq/L 137 138 141  Potassium 3.5 - 5.1 mEq/L 4.7 4.4 4.4  Chloride 96 - 112 mEq/L 103 102 103  CO2 19 - 32 mEq/L 27 29 24   Calcium 8.4 - 10.5 mg/dL 9.4 9.2 9.4  Total Protein 6.0 - 8.3 g/dL 7.0 6.8 -  Total Bilirubin 0.2 - 1.2 mg/dL 0.7 0.8  -  Alkaline Phos 39 - 117 U/L 87 - -  AST 0 - 37 U/L 41(H) 38(H) -  ALT 0 - 53 U/L 53 46 -    CrCl cannot be calculated (Patient's most recent lab result is older than the maximum 21 days allowed.).  Yevonne Aline. Stanford Breed, MD [facility] Phone Number: [phone] 03/24/2021 1:09 PM

## 2021-03-24 ENCOUNTER — Other Ambulatory Visit: Payer: Self-pay

## 2021-03-24 ENCOUNTER — Ambulatory Visit: Payer: PPO | Admitting: Vascular Surgery

## 2021-03-24 ENCOUNTER — Encounter: Payer: Self-pay | Admitting: Vascular Surgery

## 2021-03-24 VITALS — BP 134/80 | HR 63 | Temp 98.7°F | Resp 20 | Ht 72.0 in | Wt 314.0 lb

## 2021-03-24 DIAGNOSIS — Z9889 Other specified postprocedural states: Secondary | ICD-10-CM | POA: Diagnosis not present

## 2021-03-24 DIAGNOSIS — Z8679 Personal history of other diseases of the circulatory system: Secondary | ICD-10-CM

## 2021-04-20 ENCOUNTER — Other Ambulatory Visit: Payer: Self-pay | Admitting: Cardiovascular Disease

## 2021-04-27 ENCOUNTER — Other Ambulatory Visit: Payer: Self-pay

## 2021-04-27 ENCOUNTER — Ambulatory Visit: Payer: PPO | Admitting: Cardiovascular Disease

## 2021-04-27 ENCOUNTER — Encounter: Payer: Self-pay | Admitting: Cardiovascular Disease

## 2021-04-27 VITALS — BP 126/86 | HR 64 | Ht 72.0 in | Wt 317.4 lb

## 2021-04-27 DIAGNOSIS — I2510 Atherosclerotic heart disease of native coronary artery without angina pectoris: Secondary | ICD-10-CM

## 2021-04-27 DIAGNOSIS — I7143 Infrarenal abdominal aortic aneurysm, without rupture: Secondary | ICD-10-CM

## 2021-04-27 DIAGNOSIS — E782 Mixed hyperlipidemia: Secondary | ICD-10-CM | POA: Diagnosis not present

## 2021-04-27 DIAGNOSIS — I10 Essential (primary) hypertension: Secondary | ICD-10-CM

## 2021-04-27 MED ORDER — ATORVASTATIN CALCIUM 10 MG PO TABS: 10.0000 mg | ORAL_TABLET | Freq: Every day | ORAL | 3 refills | Status: DC

## 2021-04-27 MED ORDER — VALSARTAN 160 MG PO TABS: 160.0000 mg | ORAL_TABLET | Freq: Every day | ORAL | 3 refills | Status: DC
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
## 2021-04-27 NOTE — Patient Instructions (Addendum)
Medication Instructions:  No changes *If you need a refill on your cardiac medications before your next appointment, please call your pharmacy*   Lab Work: none If you have labs (blood work) drawn today and your tests are completely normal, you will receive your results only by: [place] (if you have MyChart) OR A paper copy in the mail If you have any lab test that is abnormal or we need to change your treatment, we will call you to review the results.   Testing/Procedures: none   Follow-Up: At [facility], you and your health needs are our priority.  As part of our continuing mission to provide you with exceptional heart care, we have created designated Provider Care Teams.  These Care Teams include your primary Cardiologist (physician) and Advanced Practice Providers (APPs -  Physician Assistants and Nurse Practitioners) who all work together to provide you with the care you need, when you need it.   Your next appointment:   12 month(s)  The format for your next appointment:   In Person  Provider:   Sherren Mocha, MD     Other Instructions We will plan for your nuclear stress test to be completed in September 2023, as part of your license renewal.

## 2021-05-14 ENCOUNTER — Other Ambulatory Visit: Payer: Self-pay | Admitting: Cardiovascular Disease

## 2021-05-14 DIAGNOSIS — I10 Essential (primary) hypertension: Secondary | ICD-10-CM

## 2021-05-20 ENCOUNTER — Other Ambulatory Visit: Payer: Self-pay | Admitting: Cardiovascular Disease

## 2021-05-20 DIAGNOSIS — I2510 Atherosclerotic heart disease of native coronary artery without angina pectoris: Secondary | ICD-10-CM

## 2021-06-05 ENCOUNTER — Telehealth: Payer: Self-pay | Admitting: Family Medicine

## 2021-06-05 NOTE — Telephone Encounter (Signed)
Copied from [place] [id number]. Topic: Medicare AWV >> Jun 05, 2021 10:10 AM Harris-Coley, Hannah Beat wrote: Reason for CRM: LVM 06/05/21 AWV appt changed to 06/19/21 at 8:30am khc.  Please confirm appt date change.

## 2021-06-18 ENCOUNTER — Ambulatory Visit: Payer: PPO

## 2021-06-19 ENCOUNTER — Other Ambulatory Visit: Payer: Self-pay

## 2021-06-19 ENCOUNTER — Ambulatory Visit (INDEPENDENT_AMBULATORY_CARE_PROVIDER_SITE_OTHER): Payer: PPO

## 2021-06-19 VITALS — Wt 310.0 lb

## 2021-06-19 DIAGNOSIS — Z0000 Encounter for general adult medical examination without abnormal findings: Secondary | ICD-10-CM | POA: Diagnosis not present

## 2021-06-19 NOTE — Patient Instructions (Signed)
Alfred [name] , Thank you for taking time to come for your Medicare Wellness Visit. I appreciate your ongoing commitment to your health goals. Please review the following plan we discussed and let me know if I can assist you in the future.   Screening recommendations/referrals: Colonoscopy: Done 10/22/20 repeat every 3 years  Recommended yearly ophthalmology/optometry visit for glaucoma screening and checkup Recommended yearly dental visit for hygiene and checkup  Vaccinations: Influenza vaccine: Due and discussed Pneumococcal vaccine: Up to date Tdap vaccine: Due and discussed  Shingles vaccine: Completed 1/9 & 10/11/20    Covid-19: Completed 3/4, 4/6, & 05/29/20  Advanced directives: Please bring a copy of your health care power of attorney and living will to the office at your convenience.  Conditions/risks identified: Lose weight   Next appointment: Follow up in one year for your annual wellness visit.   Preventive Care 71 Years and Older, Male Preventive care refers to lifestyle choices and visits with your health care provider that can promote health and wellness. What does preventive care include? A yearly physical exam. This is also called an annual well check. Dental exams once or twice a year. Routine eye exams. Ask your health care provider how often you should have your eyes checked. Personal lifestyle choices, including: Daily care of your teeth and gums. Regular physical activity. Eating a healthy diet. Avoiding tobacco and drug use. Limiting alcohol use. Practicing safe sex. Taking low doses of aspirin every day. Taking vitamin and mineral supplements as recommended by your health care provider. What happens during an annual well check? The services and screenings done by your health care provider during your annual well check will depend on your age, overall health, lifestyle risk factors, and family history of disease. Counseling  Your health care provider may ask  you questions about your: Alcohol use. Tobacco use. Drug use. Emotional well-being. Home and relationship well-being. Sexual activity. Eating habits. History of falls. Memory and ability to understand (cognition). Work and work Statistician. Screening  You may have the following tests or measurements: Height, weight, and BMI. Blood pressure. Lipid and cholesterol levels. These may be checked every 5 years, or more frequently if you are over 16 years old. Skin check. Lung cancer screening. You may have this screening every year starting at age 11 if you have a 30-pack-year history of smoking and currently smoke or have quit within the past 15 years. Fecal occult blood test (FOBT) of the stool. You may have this test every year starting at age 68. Flexible sigmoidoscopy or colonoscopy. You may have a sigmoidoscopy every 5 years or a colonoscopy every 10 years starting at age 71. Prostate cancer screening. Recommendations will vary depending on your family history and other risks. Hepatitis C blood test. Hepatitis B blood test. Sexually transmitted disease (STD) testing. Diabetes screening. This is done by checking your blood sugar (glucose) after you have not eaten for a while (fasting). You may have this done every 1-3 years. Abdominal aortic aneurysm (AAA) screening. You may need this if you are a current or former smoker. Osteoporosis. You may be screened starting at age 65 if you are at high risk. Talk with your health care provider about your test results, treatment options, and if necessary, the need for more tests. Vaccines  Your health care provider may recommend certain vaccines, such as: Influenza vaccine. This is recommended every year. Tetanus, diphtheria, and acellular pertussis (Tdap, Td) vaccine. You may need a Td booster every 10 years. Zoster vaccine.  You may need this after age 45. Pneumococcal 13-valent conjugate (PCV13) vaccine. One dose is recommended after age  71. Pneumococcal polysaccharide (PPSV23) vaccine. One dose is recommended after age 71. Talk to your health care provider about which screenings and vaccines you need and how often you need them. This information is not intended to replace advice given to you by your health care provider. Make sure you discuss any questions you have with your health care provider. Document Released: 06/13/2015 Document Revised: 02/04/2016 Document Reviewed: 03/18/2015 Elsevier Interactive Patient Education  2017 [place] Prevention in the Home Falls can cause injuries. They can happen to people of all ages. There are many things you can do to make your home safe and to help prevent falls. What can I do on the outside of my home? Regularly fix the edges of walkways and driveways and fix any cracks. Remove anything that might make you trip as you walk through a door, such as a raised step or threshold. Trim any bushes or trees on the path to your home. Use bright outdoor lighting. Clear any walking paths of anything that might make someone trip, such as rocks or tools. Regularly check to see if handrails are loose or broken. Make sure that both sides of any steps have handrails. Any raised decks and porches should have guardrails on the edges. Have any leaves, snow, or ice cleared regularly. Use sand or salt on walking paths during winter. Clean up any spills in your garage right away. This includes oil or grease spills. What can I do in the bathroom? Use night lights. Install grab bars by the toilet and in the tub and shower. Do not use towel bars as grab bars. Use non-skid mats or decals in the tub or shower. If you need to sit down in the shower, use a plastic, non-slip stool. Keep the floor dry. Clean up any water that spills on the floor as soon as it happens. Remove soap buildup in the tub or shower regularly. Attach bath mats securely with double-sided non-slip rug tape. Do not have throw  rugs and other things on the floor that can make you trip. What can I do in the bedroom? Use night lights. Make sure that you have a light by your bed that is easy to reach. Do not use any sheets or blankets that are too big for your bed. They should not hang down onto the floor. Have a firm chair that has side arms. You can use this for support while you get dressed. Do not have throw rugs and other things on the floor that can make you trip. What can I do in the kitchen? Clean up any spills right away. Avoid walking on wet floors. Keep items that you use a lot in easy-to-reach places. If you need to reach something above you, use a strong step stool that has a grab bar. Keep electrical cords out of the way. Do not use floor polish or wax that makes floors slippery. If you must use wax, use non-skid floor wax. Do not have throw rugs and other things on the floor that can make you trip. What can I do with my stairs? Do not leave any items on the stairs. Make sure that there are handrails on both sides of the stairs and use them. Fix handrails that are broken or loose. Make sure that handrails are as long as the stairways. Check any carpeting to make sure that it is  firmly attached to the stairs. Fix any carpet that is loose or worn. Avoid having throw rugs at the top or bottom of the stairs. If you do have throw rugs, attach them to the floor with carpet tape. Make sure that you have a light switch at the top of the stairs and the bottom of the stairs. If you do not have them, ask someone to add them for you. What else can I do to help prevent falls? Wear shoes that: Do not have high heels. Have rubber bottoms. Are comfortable and fit you well. Are closed at the toe. Do not wear sandals. If you use a stepladder: Make sure that it is fully opened. Do not climb a closed stepladder. Make sure that both sides of the stepladder are locked into place. Ask someone to hold it for you, if  possible. Clearly mark and make sure that you can see: Any grab bars or handrails. First and last steps. Where the edge of each step is. Use tools that help you move around (mobility aids) if they are needed. These include: Canes. Walkers. Scooters. Crutches. Turn on the lights when you go into a dark area. Replace any light bulbs as soon as they burn out. Set up your furniture so you have a clear path. Avoid moving your furniture around. If any of your floors are uneven, fix them. If there are any pets around you, be aware of where they are. Review your medicines with your doctor. Some medicines can make you feel dizzy. This can increase your chance of falling. Ask your doctor what other things that you can do to help prevent falls. This information is not intended to replace advice given to you by your health care provider. Make sure you discuss any questions you have with your health care provider. Document Released: 03/13/2009 Document Revised: 10/23/2015 Document Reviewed: 06/21/2014 Elsevier Interactive Patient Education  2017 [employer].

## 2021-06-19 NOTE — Progress Notes (Addendum)
Virtual Visit via Telephone Note  I connected with  Alfred [name] on 06/19/21 at  8:00 AM EST by telephone and verified that I am speaking with the correct person using two identifiers.  Medicare Annual Wellness visit completed telephonically due to Covid-19 pandemic.   Persons participating in this call: This Health Coach and this patient.   Location: Patient: Home Provider: Office   I discussed the limitations, risks, security and privacy concerns of performing an evaluation and management service by telephone and the availability of in person appointments. The patient expressed understanding and agreed to proceed.  Unable to perform video visit due to video visit attempted and failed and/or patient does not have video capability.   Some vital signs may be absent or patient reported.   Willette Brace, LPN   Subjective:   JAIMIE [name] is a 71 y.o. male who presents for Medicare Annual/Subsequent preventive examination.  Review of Systems     Cardiac Risk Factors include: advanced age (>9men, >72 women);hypertension;dyslipidemia;male gender;obesity (BMI >30kg/m2)     Objective:    Today's Vitals   06/19/21 0818  Weight: (!) 310 lb (140.6 kg)   Body mass index is 42.04 kg/m.  Advanced Directives 06/19/2021 06/12/2020 04/30/2019 01/22/2016 01/09/2015 01/21/2012 01/18/2012  Does Patient Have a Medical Advance Directive? Yes Yes Yes Yes Yes Patient has advance directive, copy not in chart Patient has advance directive, copy not in chart  Type of Advance Directive Healthcare Power of Attorney Living will [place] of [place];Living will [place];Living will Healthcare Power of [place]  Does patient want to make changes to medical advance directive? - - No - Patient declined - No - Patient declined - -  Copy of [place] in Chart? No - copy requested - No - copy  requested - - Copy requested from other (Comment) Copy requested from other (Comment)  Would patient like information on creating a medical advance directive? - - - - - - -  Pre-existing out of facility DNR order (yellow form or pink MOST form) - - - - - No -    Current Medications (verified) Outpatient Encounter Medications as of 06/19/2021  Medication Sig   acetaminophen (TYLENOL) 500 MG tablet Take 500 mg by mouth every 6 (six) hours as needed for mild pain. PRN for pain/fever   aspirin 81 MG tablet Take 81 mg by mouth at bedtime.   atorvastatin (LIPITOR) 10 MG tablet Take 1 tablet (10 mg total) by mouth daily.   clopidogrel (PLAVIX) 75 MG tablet TAKE 1 TABLET BY MOUTH EVERY DAY   metoprolol succinate (TOPROL-XL) 50 MG 24 hr tablet TAKE 1 TABLET BY MOUTH DAILY. TAKE WITH OR IMMEDIATELY FOLLOWING A MEAL.   valsartan (DIOVAN) 160 MG tablet Take 1 tablet (160 mg total) by mouth daily.   [DISCONTINUED] gabapentin (NEURONTIN) 300 MG capsule Take 300 mg by mouth at bedtime. (Patient not taking: Reported on 04/27/2021)   No facility-administered encounter medications on file as of 06/19/2021.    Allergies (verified) Patient has no known allergies.   History: Past Medical History:  Diagnosis Date   AAA (abdominal aortic aneurysm) 08/2011   see Surgical history   Anginal pain ([place])    Arthritis    "knees; not bad"   CAD (coronary artery disease)    DES to RCA and CFX; Dr. Burt Knack cardiologist   Depression ~ 2009   'treated for ~ 6  months"   Dyslipidemia    HTN (hypertension)    unspecified essential   Hx of adenomatous colonic polyps 2008   Hyperplasia of prostate without lower urinary tract symptoms (LUTS)    Left lumbar radiculopathy    Other and unspecified hyperlipidemia    Stroke [facility])    Past Surgical History:  Procedure Laterality Date   ABDOMINAL AORTIC ANEURYSM REPAIR  09/06/2011   ABDOMINAL AORTIC ANEURYSM REPAIR  01/21/2012   Procedure: ANEURYSM ABDOMINAL AORTIC REPAIR;   Surgeon: Elam Dutch, MD;  Location: [facility] OR;  Service: Vascular;  Laterality: N/A;  Aortio- bi-iliac repair  using Cryolife descending thoracic aorta 48mm x10.5 cm.   APPENDECTOMY  1970   ARTERY REPAIR  09/06/2011   Procedure: ARTERY REPAIR;  Surgeon: Elam Dutch, MD;  Location: [facility] OR;  Service: Vascular;  Laterality: Bilateral;   COLONOSCOPY W/ POLYPECTOMY  2008   adenomatous  & hyperplastic polyp;due 2013   CORONARY ANGIOPLASTY WITH STENT PLACEMENT  2002; 2005   "2 +1; total of 3"   CYSTOSCOPY  2005   varicose veins in bladder, Dr Diona Fanti   TONSILLECTOMY AND ADENOIDECTOMY  1954   VASECTOMY     Family History  Problem Relation Age of Onset   Coronary artery disease Father        CABG in 12s   Diabetes Father    Colon polyps Father    Lung cancer Father        non-small cell carcinoma/bone CA   Hypertension Father    Coronary artery disease Maternal Grandfather    Coronary artery disease Paternal Grandfather    Other Mother        varicose veins   Anesthesia problems Neg Hx    Stroke Neg Hx    Colon cancer Neg Hx    Esophageal cancer Neg Hx    Rectal cancer Neg Hx    Stomach cancer Neg Hx    Social History   Socioeconomic History   Marital status: Married    Spouse name: Not on file   Number of children: Not on file   Years of education: Not on file   Highest education level: Not on file  Occupational History   Occupation: Farming  Tobacco Use   Smoking status: Former    Packs/day: 1.00    Years: 27.00    Pack years: 27.00    Types: Cigarettes    Quit date: 05/31/1993    Years since quitting: 28.0   Smokeless tobacco: Current    Types: Snuff   Tobacco comments:    smoked 1968-1995, up to 1 ppd. Currently using Skoal 1 can / day  Vaping Use   Vaping Use: Never used  Substance and Sexual Activity   Alcohol use: Yes    Alcohol/week: 21.0 standard drinks    Types: 21 Shots of liquor per week    Comment: 2-3 drinks of Vodka a night, occasional beer    Drug use: No   Sexual activity: Yes  Other Topics Concern   Not on file  Social History Narrative   Not on file   Social Determinants of Health   Financial Resource Strain: Low Risk    Difficulty of Paying Living Expenses: Not hard at all  Food Insecurity: No Food Insecurity   Worried About Charity fundraiser in the Last Year: Never true   Ran Out of Food in the Last Year: Never true  Transportation Needs: No Transportation Needs   Lack of Transportation (  Medical): No   Lack of Transportation (Non-Medical): No  Physical Activity: Inactive   Days of Exercise per Week: 0 days   Minutes of Exercise per Session: 0 min  Stress: No Stress Concern Present   Feeling of Stress : Only a little  Social Connections: Moderately Isolated   Frequency of Communication with Friends and Family: Twice a week   Frequency of Social Gatherings with Friends and Family: Three times a week   Attends Religious Services: Never   Active Member of Clubs or Organizations: No   Attends Archivist Meetings: Never   Marital Status: Married    Tobacco Counseling Ready to quit: Not Answered Counseling given: Not Answered Tobacco comments: smoked 1968-1995, up to 1 ppd. Currently using Skoal 1 can / day   Clinical Intake:  Pre-visit preparation completed: Yes  Pain : No/denies pain     BMI - recorded: 42.04 Nutritional Status: BMI > 30  Obese Nutritional Risks: None Diabetes: No  How often do you need to have someone help you when you read instructions, pamphlets, or other written materials from your doctor or pharmacy?: 1 - Never  Diabetic?no  Interpreter Needed?: No  Information entered by :: Dayton Martes   Activities of Daily Living In your present state of health, do you have any difficulty performing the following activities: 06/19/2021  Hearing? Y  Comment slight loss  Vision? N  Difficulty concentrating or making decisions? N  Walking or climbing stairs? N   Dressing or bathing? N  Doing errands, shopping? N  Preparing Food and eating ? N  Using the Toilet? N  In the past six months, have you accidently leaked urine? N  Do you have problems with loss of bowel control? N  Managing your Medications? N  Managing your Finances? N  Housekeeping or managing your Housekeeping? N  Some recent data might be hidden    Patient Care Team: Vivi Barrack, MD as PCP - General (Family Medicine) Sherren Mocha, MD as PCP - Cardiology (Cardiology) [name], [facility] as Pharmacist (Pharmacist)  Indicate any recent Medical Services you may have received from other than [facility] providers in the past year (date may be approximate).     Assessment:   This is a routine wellness examination for [place].  Hearing/Vision screen Hearing Screening - Comments:: Pt stated slight loss  Vision Screening - Comments:: Pt follows up with [facility] for annul eye exams   Dietary issues and exercise activities discussed: Current Exercise Habits: The patient has a physically strenuous job, but has no regular exercise apart from work.   Goals Addressed             This Visit's Progress    Patient Stated       Patient Stated       Lose weight        Depression Screen PHQ 2/9 Scores 06/19/2021 01/27/2021 06/12/2020 04/30/2019 01/25/2019 01/25/2018 01/16/2013  PHQ - 2 Score 0 0 0 0 0 0 0    Fall Risk Fall Risk  06/19/2021 01/27/2021 06/12/2020 04/30/2019 01/25/2018  Falls in the past year? 0 0 0 0 No  Number falls in past yr: 0 0 0 - -  Injury with Fall? 0 0 0 0 -  Risk for fall due to : Impaired vision No Fall Risks Impaired vision - -  Follow up Falls prevention discussed - Falls prevention discussed Falls evaluation completed;Education provided;Falls prevention discussed -    FALL RISK PREVENTION PERTAINING  TO THE HOME:  Any stairs in or around the home? Yes  If so, are there any without handrails? No  Home free of loose throw rugs in walkways, pet  beds, electrical cords, etc? Yes  Adequate lighting in your home to reduce risk of falls? Yes   ASSISTIVE DEVICES UTILIZED TO PREVENT FALLS:  Life alert? No  Use of a cane, walker or w/c? No  Grab bars in the bathroom? No  Shower chair or bench in shower? No  Elevated toilet seat or a handicapped toilet? No   TIMED UP AND GO:  Was the test performed? No .   Cognitive Function:     6CIT Screen 06/19/2021 06/12/2020  What Year? 0 points 0 points  What month? 0 points 0 points  What time? 0 points -  Count back from 20 0 points 0 points  Months in reverse 0 points 0 points  Repeat phrase 0 points 0 points  Total Score 0 -    Immunizations Immunization History  Administered Date(s) Administered   Fluad Quad(high Dose 65+) 01/25/2019   PFIZER(Purple Top)SARS-COV-2 Vaccination 08/02/2019, 09/04/2019, 05/29/2020   Pneumococcal Conjugate-13 01/25/2018   Pneumococcal Polysaccharide-23 01/27/2021   Zoster Recombinat (Shingrix) 06/08/2020, 10/11/2020    TDAP status: Due, Education has been provided regarding the importance of this vaccine. Advised may receive this vaccine at local pharmacy or Health Dept. Aware to provide a copy of the vaccination record if obtained from local pharmacy or Health Dept. Verbalized acceptance and understanding.  Flu Vaccine status: Due, Education has been provided regarding the importance of this vaccine. Advised may receive this vaccine at local pharmacy or Health Dept. Aware to provide a copy of the vaccination record if obtained from local pharmacy or Health Dept. Verbalized acceptance and understanding.  Pneumococcal vaccine status: Up to date  Covid-19 vaccine status: Completed vaccines  Qualifies for Shingles Vaccine? Yes   Zostavax completed Yes   Shingrix Completed?: Yes  Screening Tests Health Maintenance  Topic Date Due   Hepatitis C Screening  Never done   TETANUS/TDAP  Never done   COVID-19 Vaccine (4 - Booster for Pfizer series)  07/24/2020   INFLUENZA VACCINE  12/29/2020   COLONOSCOPY (Pts 45-63yrs Insurance coverage will need to be confirmed)  10/23/2023   Pneumonia Vaccine 102+ Years old  Completed   Zoster Vaccines- Shingrix  Completed   HPV VACCINES  Aged Out    Health Maintenance  Health Maintenance Due  Topic Date Due   Hepatitis C Screening  Never done   TETANUS/TDAP  Never done   COVID-19 Vaccine (4 - Booster for [place]on series) 07/24/2020   INFLUENZA VACCINE  12/29/2020    Colorectal cancer screening: Type of screening: Colonoscopy. Completed 10/22/20. Repeat every 3 years  Additional Screening:  Hepatitis C Screening: does qualify  Vision Screening: Recommended annual ophthalmology exams for early detection of glaucoma and other disorders of the eye. Is the patient up to date with their annual eye exam?  Yes  Who is the provider or what is the name of the office in which the patient attends annual eye exams? [facility]  If pt is not established with a provider, would they like to be referred to a provider to establish care? No .   Dental Screening: Recommended annual dental exams for proper oral hygiene  Community Resource Referral / Chronic Care Management: CRR required this visit?  No   CCM required this visit?  No      Plan:  I have personally reviewed and noted the following in the patients chart:   Medical and social history Use of alcohol, tobacco or illicit drugs  Current medications and supplements including opioid prescriptions. Patient is not currently taking opioid prescriptions. Functional ability and status Nutritional status Physical activity Advanced directives List of other physicians Hospitalizations, surgeries, and ER visits in previous 12 months Vitals Screenings to include cognitive, depression, and falls Referrals and appointments  In addition, I have reviewed and discussed with patient certain preventive protocols, quality metrics, and best practice  recommendations. A written personalized care plan for preventive services as well as general preventive health recommendations were provided to patient.     Willette Brace, LPN   [id number]   Nurse Notes: None

## 2021-09-03 ENCOUNTER — Encounter: Payer: Self-pay | Admitting: Cardiovascular Disease

## 2021-09-03 DIAGNOSIS — I2510 Atherosclerotic heart disease of native coronary artery without angina pectoris: Secondary | ICD-10-CM

## 2021-09-07 NOTE — Telephone Encounter (Signed)
Per Dr Antionette Char okay, ?Order placed for Lexiscan Stress test at this time per pt request. Will route to [facility] for scheduling. ?

## 2021-09-11 ENCOUNTER — Encounter (HOSPITAL_COMMUNITY): Payer: Self-pay | Admitting: Cardiovascular Disease

## 2021-09-14 ENCOUNTER — Telehealth (HOSPITAL_COMMUNITY): Payer: Self-pay | Admitting: *Deleted

## 2021-09-14 NOTE — Telephone Encounter (Signed)
err

## 2021-09-14 NOTE — Telephone Encounter (Signed)
Patient given detailed instructions per Myocardial Perfusion Study Information Sheet for the test on 09/21/2021 at 7:45. Patient notified to arrive 15 minutes early and that it is imperative to arrive on time for appointment to keep from having the test rescheduled. ? If you need to cancel or reschedule your appointment, please call the office within 24 hours of your appointment. . Patient verbalized understanding.Alfred [name] S ? ? ?

## 2021-09-21 ENCOUNTER — Ambulatory Visit (HOSPITAL_COMMUNITY): Payer: PPO | Attending: Cardiology

## 2021-09-21 DIAGNOSIS — I2510 Atherosclerotic heart disease of native coronary artery without angina pectoris: Secondary | ICD-10-CM | POA: Diagnosis not present

## 2021-09-21 MED ORDER — REGADENOSON 0.4 MG/5ML IV SOLN
0.4000 mg | Freq: Once | INTRAVENOUS | Status: AC
  Administered 2021-09-21: 0.4 mg via INTRAVENOUS

## 2021-09-21 MED ORDER — TECHNETIUM TC 99M TETROFOSMIN IV KIT
32.9000 | PACK | Freq: Once | INTRAVENOUS | Status: AC | PRN
  Administered 2021-09-21: 32.9 via INTRAVENOUS
  Filled 2021-09-21: qty 33

## 2021-09-23 ENCOUNTER — Ambulatory Visit (HOSPITAL_COMMUNITY): Payer: PPO | Attending: Cardiology

## 2021-09-23 LAB — MYOCARDIAL PERFUSION IMAGING
LV dias vol: 103 mL (ref 62–150)
LV sys vol: 43 mL
Nuc Stress EF: 58 %
Rest Nuclear Isotope Dose: 32.8 mCi
SDS: 4
SRS: 0
SSS: 4
ST Depression (mm): 0 mm
Stress Nuclear Isotope Dose: 32.9 mCi
TID: 1.12

## 2021-09-23 MED ORDER — TECHNETIUM TC 99M TETROFOSMIN IV KIT
32.8000 | PACK | Freq: Once | INTRAVENOUS | Status: AC | PRN
  Administered 2021-09-23: 32.8 via INTRAVENOUS
  Filled 2021-09-23: qty 33

## 2022-01-28 ENCOUNTER — Encounter: Payer: PPO | Admitting: Family Medicine

## 2022-02-16 ENCOUNTER — Encounter: Payer: Self-pay | Admitting: Family Medicine

## 2022-02-16 ENCOUNTER — Ambulatory Visit (INDEPENDENT_AMBULATORY_CARE_PROVIDER_SITE_OTHER): Payer: PPO | Admitting: Family Medicine

## 2022-02-16 VITALS — BP 132/74 | HR 55 | Temp 97.2°F | Ht 72.0 in | Wt 314.4 lb

## 2022-02-16 DIAGNOSIS — Z0001 Encounter for general adult medical examination with abnormal findings: Secondary | ICD-10-CM

## 2022-02-16 DIAGNOSIS — Z1159 Encounter for screening for other viral diseases: Secondary | ICD-10-CM

## 2022-02-16 DIAGNOSIS — E785 Hyperlipidemia, unspecified: Secondary | ICD-10-CM

## 2022-02-16 DIAGNOSIS — I10 Essential (primary) hypertension: Secondary | ICD-10-CM

## 2022-02-16 DIAGNOSIS — Z23 Encounter for immunization: Secondary | ICD-10-CM

## 2022-02-16 DIAGNOSIS — R739 Hyperglycemia, unspecified: Secondary | ICD-10-CM

## 2022-02-16 DIAGNOSIS — M5412 Radiculopathy, cervical region: Secondary | ICD-10-CM | POA: Insufficient documentation

## 2022-02-16 LAB — TSH: TSH: 2.28 u[IU]/mL (ref 0.35–5.50)

## 2022-02-16 LAB — COMPREHENSIVE METABOLIC PANEL
ALT: 91 U/L — ABNORMAL HIGH (ref 0–53)
AST: 78 U/L — ABNORMAL HIGH (ref 0–37)
Albumin: 3.8 g/dL (ref 3.5–5.2)
Alkaline Phosphatase: 120 U/L — ABNORMAL HIGH (ref 39–117)
BUN: 19 mg/dL (ref 6–23)
CO2: 28 mEq/L (ref 19–32)
Calcium: 9.2 mg/dL (ref 8.4–10.5)
Chloride: 104 mEq/L (ref 96–112)
Creatinine, Ser: 0.98 mg/dL (ref 0.40–1.50)
GFR: 77.91 mL/min (ref >60.00)
Glucose, Bld: 104 mg/dL — ABNORMAL HIGH (ref 70–99)
Potassium: 4.4 mEq/L (ref 3.5–5.1)
Sodium: 139 mEq/L (ref 135–145)
Total Bilirubin: 0.8 mg/dL (ref 0.2–1.2)
Total Protein: 7.1 g/dL (ref 6.0–8.3)

## 2022-02-16 LAB — PSA: PSA: 0.14 ng/mL (ref 0.10–4.00)

## 2022-02-16 LAB — LIPID PANEL
Cholesterol: 124 mg/dL (ref 0–200)
HDL: 46.8 mg/dL (ref >39.00)
LDL Cholesterol: 59 mg/dL (ref 0–99)
NonHDL: 76.99
Total CHOL/HDL Ratio: 3
Triglycerides: 89 mg/dL (ref 0.0–149.0)
VLDL: 17.8 mg/dL (ref 0.0–40.0)

## 2022-02-16 LAB — CBC
HCT: 44.7 % (ref 39.0–52.0)
Hemoglobin: 15.3 g/dL (ref 13.0–17.0)
MCHC: 34.3 g/dL (ref 30.0–36.0)
MCV: 103.2 fl — ABNORMAL HIGH (ref 78.0–100.0)
Platelets: 124 10*3/uL — ABNORMAL LOW (ref 150.0–400.0)
RBC: 4.33 Mil/uL (ref 4.22–5.81)
RDW: 13.4 % (ref 11.5–15.5)
WBC: 3.5 10*3/uL — ABNORMAL LOW (ref 4.0–10.5)

## 2022-02-16 LAB — HEMOGLOBIN A1C: Hgb A1c MFr Bld: 5.5 % (ref 4.6–6.5)

## 2022-02-16 NOTE — Assessment & Plan Note (Signed)
At goal per JNC 8.  Continue management per cardiology.  He is on metoprolol succinate 50 mg daily, valsartan 160 mg daily

## 2022-02-16 NOTE — Assessment & Plan Note (Signed)
Check A1c. 

## 2022-02-16 NOTE — Progress Notes (Signed)
Chief Complaint:  Alfred [name] is a 71 y.o. male who presents today for his annual comprehensive physical exam.    Assessment/Plan:  Chronic Problems Addressed Today: Essential hypertension At goal per JNC 8.  Continue management per cardiology.  He is on metoprolol succinate 50 mg daily, valsartan 160 mg daily  Hyperlipidemia Check lipids.  He is on Lipitor 10 mg daily.  Cervical radiculopathy Had an MRI done last year which showed degenerative changes in his C-spine with possible nerve impingement.  He was referred to neurosurgery.  Decided against surgery.  Symptoms are currently manageable.  Does have a positive Tinel sign at left medial epicondyle may have some ulnar neuropathy as well.  We discussed referral to see orthopedics however he deferred for now.  We will continue with watchful waiting.  We discussed reasons to return to care.  Hyperglycemia Check A1c.   Preventative Healthcare: Flu shot given today.  Up-to-date on colon cancer screening.  We will check labs today.  Patient Counseling(The following topics were reviewed and/or handout was given):  -Nutrition: Stressed importance of moderation in sodium/caffeine intake, saturated fat and cholesterol, caloric balance, sufficient intake of fresh fruits, vegetables, and fiber.  -Stressed the importance of regular exercise.   -Substance Abuse: Discussed cessation/primary prevention of tobacco, alcohol, or other drug use; driving or other dangerous activities under the influence; availability of treatment for abuse.   -Injury prevention: Discussed safety belts, safety helmets, smoke detector, smoking near bedding or upholstery.   -Sexuality: Discussed sexually transmitted diseases, partner selection, use of condoms, avoidance of unintended pregnancy and contraceptive alternatives.   -Dental health: Discussed importance of regular tooth brushing, flossing, and dental visits.  -Health maintenance and immunizations reviewed.  Please refer to Health maintenance section.  Return to care in 1 year for next preventative visit.     Subjective:  HPI:  He has no acute complaints today.   Lifestyle Diet: None specific.  Exercise: Busy with work.      02/16/2022    7:54 AM  Depression screen PHQ 2/9  Decreased Interest 0  Down, Depressed, Hopeless 0  PHQ - 2 Score 0    Health Maintenance Due  Topic Date Due   Hepatitis C Screening  Never done   INFLUENZA VACCINE  12/29/2021     ROS: Per HPI, otherwise a complete review of systems was negative.   PMH:  The following were reviewed and entered/updated in epic: Past Medical History:  Diagnosis Date   AAA (abdominal aortic aneurysm) ([place]) 08/2011   see Surgical history   Anginal pain ([place])    Arthritis    "knees; not bad"   CAD (coronary artery disease)    DES to RCA and CFX; Dr. Burt Knack cardiologist   Depression ~ 2009   'treated for ~ 6 months"   Dyslipidemia    HTN (hypertension)    unspecified essential   Hx of adenomatous colonic polyps 2008   Hyperplasia of prostate without lower urinary tract symptoms (LUTS)    Left lumbar radiculopathy    Other and unspecified hyperlipidemia    Stroke [facility])    Patient Active Problem List   Diagnosis Date Noted   Cervical radiculopathy 02/16/2022   Hyperglycemia 01/27/2021   Sleep-disordered breathing 01/27/2021   Actinic keratosis 01/28/2020   CAD (coronary artery disease)    Essential hypertension 05/02/2009   History of AAA (abdominal aortic aneurysm) repair 11/05/2008   COLONIC POLYPS, ADENOMATOUS 03/28/2008   Hyperlipidemia 03/28/2008   LUMBAR RADICULOPATHY, LEFT  03/28/2008   Past Surgical History:  Procedure Laterality Date   ABDOMINAL AORTIC ANEURYSM REPAIR  09/06/2011   ABDOMINAL AORTIC ANEURYSM REPAIR  01/21/2012   Procedure: ANEURYSM ABDOMINAL AORTIC REPAIR;  Surgeon: Elam Dutch, MD;  Location: [place];  Service: Vascular;  Laterality: N/A;  Aortio- bi-iliac repair  using Cryolife  descending thoracic aorta 19m x10.5 cm.   APPENDECTOMY  1970   ARTERY REPAIR  09/06/2011   Procedure: ARTERY REPAIR;  Surgeon: CElam Dutch MD;  [facility];  Service: Vascular;  Laterality: Bilateral;   COLONOSCOPY W/ POLYPECTOMY  2008   adenomatous  & hyperplastic polyp;due 2013   CORONARY ANGIOPLASTY WITH STENT PLACEMENT  2002; 2005   "2 +1; total of 3"   CYSTOSCOPY  2005   varicose veins in bladder, Dr dDiona Fanti  TONSILLECTOMY AND ADENOIDECTOMY  1954   VASECTOMY      Family History  Problem Relation Age of Onset   Coronary artery disease Father        CABG in 660s  Diabetes Father    Colon polyps Father    Lung cancer Father        non-small cell carcinoma/bone CA   Hypertension Father    Coronary artery disease Maternal Grandfather    Coronary artery disease Paternal Grandfather    Other Mother        varicose veins   Anesthesia problems Neg Hx    Stroke Neg Hx    Colon cancer Neg Hx    Esophageal cancer Neg Hx    Rectal cancer Neg Hx    Stomach cancer Neg Hx     Medications- reviewed and updated Current Outpatient Medications  Medication Sig Dispense Refill   acetaminophen (TYLENOL) 500 MG tablet Take 500 mg by mouth every 6 (six) hours as needed for mild pain. PRN for pain/fever     aspirin 81 MG tablet Take 81 mg by mouth at bedtime.     atorvastatin (LIPITOR) 10 MG tablet Take 1 tablet (10 mg total) by mouth daily. 90 tablet 3   clopidogrel (PLAVIX) 75 MG tablet TAKE 1 TABLET BY MOUTH EVERY DAY 90 tablet 3   metoprolol succinate (TOPROL-XL) 50 MG 24 hr tablet TAKE 1 TABLET BY MOUTH DAILY. TAKE WITH OR IMMEDIATELY FOLLOWING A MEAL. 90 tablet 3   valsartan (DIOVAN) 160 MG tablet Take 1 tablet (160 mg total) by mouth daily. 90 tablet 3   No current facility-administered medications for this visit.    Allergies-reviewed and updated No Known Allergies  Social History   Socioeconomic History   Marital status: Married    Spouse name: Not on file    Number of children: Not on file   Years of education: Not on file   Highest education level: Not on file  Occupational History   Occupation: Farming  Tobacco Use   Smoking status: Former    Packs/day: 1.00    Years: 27.00    Total pack years: 27.00    Types: Cigarettes    Quit date: 05/31/1993    Years since quitting: 28.7   Smokeless tobacco: Current    Types: Snuff   Tobacco comments:    smoked 1968-1995, up to 1 ppd. Currently using Skoal 1 can / day  Vaping Use   Vaping Use: Never used  Substance and Sexual Activity   Alcohol use: Yes    Alcohol/week: 21.0 standard drinks of alcohol    Types: 21 Shots of liquor per week  Comment: 2-3 drinks of Vodka a night, occasional beer   Drug use: No   Sexual activity: Yes  Other Topics Concern   Not on file  Social History Narrative   Not on file   Social Determinants of Health   Financial Resource Strain: Low Risk  (06/19/2021)   Overall Financial Resource Strain (CARDIA)    Difficulty of Paying Living Expenses: Not hard at all  Food Insecurity: No Food Insecurity (06/19/2021)   Hunger Vital Sign    Worried About Running Out of Food in the Last Year: Never true    Ran Out of Food in the Last Year: Never true  Transportation Needs: No Transportation Needs (06/19/2021)   PRAPARE - Hydrologist (Medical): No    Lack of Transportation (Non-Medical): No  Physical Activity: Inactive (06/19/2021)   Exercise Vital Sign    Days of Exercise per Week: 0 days    Minutes of Exercise per Session: 0 min  Stress: No Stress Concern Present (06/19/2021)   [place]    Feeling of Stress : Only a little  Social Connections: Moderately Isolated (06/19/2021)   Social Connection and Isolation Panel [NHANES]    Frequency of Communication with Friends and Family: Twice a week    Frequency of Social Gatherings with Friends and Family: Three times a week     Attends Religious Services: Never    Active Member of Clubs or Organizations: No    Attends Archivist Meetings: Never    Marital Status: Married        Objective:  Physical Exam: BP (!) 145/77   Pulse (!) 55   Temp (!) 97.2 F (36.2 C) (Temporal)   Ht 6' (1.829 m)   Wt (!) 314 lb 6.4 oz (142.6 kg)   SpO2 95%   BMI 42.64 kg/m   Body mass index is 42.64 kg/m. Wt Readings from Last 3 Encounters:  02/16/22 (!) 314 lb 6.4 oz (142.6 kg)  09/21/21 (!) 310 lb (140.6 kg)  06/19/21 (!) 310 lb (140.6 kg)   Gen: NAD, resting comfortably HEENT: TMs normal bilaterally. OP clear. No thyromegaly noted.  CV: RRR with no murmurs appreciated Pulm: NWOB, CTAB with no crackles, wheezes, or rhonchi GI: Normal bowel sounds present. Soft, Nontender, Nondistended. MSK: no edema, cyanosis, or clubbing noted - Left Arm: Neurovascularly intact distally. Tinels sign positive at left medial epicondyle.  Skin: warm, dry Neuro: CN2-12 grossly intact. Strength 5/5 in upper and lower extremities. Reflexes symmetric and intact bilaterally.  Psych: Normal affect and thought content     Kailene Steinhart M. Jerline Pain, MD 02/16/2022 8:25 AM

## 2022-02-16 NOTE — Patient Instructions (Signed)
It was very nice to see you today!  We will give you your flu vaccine today.  We will check blood work.  I will see your back in a year for your next physical. Come back sooner if needed.   Take care, Dr Jerline Pain  PLEASE NOTE:  If you had any lab tests please let us know if you have not heard back within a few days. You may see your results on mychart before we have a chance to review them but we will give you a call once they are reviewed by [place]. If we ordered any referrals today, please let us know if you have not heard from their office within the next week.   Please try these tips to maintain a healthy lifestyle:  Eat at least 3 REAL meals and 1-2 snacks per day.  Aim for no more than 5 hours between eating.  If you eat breakfast, please do so within one hour of getting up.   Each meal should contain half fruits/vegetables, one quarter protein, and one quarter carbs (no bigger than a computer mouse)  Cut down on sweet beverages. This includes juice, soda, and sweet tea.   Drink at least 1 glass of water with each meal and aim for at least 8 glasses per day  Exercise at least 150 minutes every week.    Preventive Care 42 Years and Older, Male Preventive care refers to lifestyle choices and visits with your health care provider that can promote health and wellness. Preventive care visits are also called wellness exams. What can I expect for my preventive care visit? Counseling During your preventive care visit, your health care provider may ask about your: Medical history, including: Past medical problems. Family medical history. History of falls. Current health, including: Emotional well-being. Home life and relationship well-being. Sexual activity. Memory and ability to understand (cognition). Lifestyle, including: Alcohol, nicotine or tobacco, and drug use. Access to firearms. Diet, exercise, and sleep habits. Work and work Statistician. Sunscreen use. Safety issues  such as seatbelt and bike helmet use. Physical exam Your health care provider will check your: Height and weight. These may be used to calculate your BMI (body mass index). BMI is a measurement that tells if you are at a healthy weight. Waist circumference. This measures the distance around your waistline. This measurement also tells if you are at a healthy weight and may help predict your risk of certain diseases, such as type 2 diabetes and high blood pressure. Heart rate and blood pressure. Body temperature. Skin for abnormal spots. What immunizations do I need?  Vaccines are usually given at various ages, according to a schedule. Your health care provider will recommend vaccines for you based on your age, medical history, and lifestyle or other factors, such as travel or where you work. What tests do I need? Screening Your health care provider may recommend screening tests for certain conditions. This may include: Lipid and cholesterol levels. Diabetes screening. This is done by checking your blood sugar (glucose) after you have not eaten for a while (fasting). Hepatitis C test. Hepatitis B test. HIV (human immunodeficiency virus) test. STI (sexually transmitted infection) testing, if you are at risk. Lung cancer screening. Colorectal cancer screening. Prostate cancer screening. Abdominal aortic aneurysm (AAA) screening. You may need this if you are a current or former smoker. Talk with your health care provider about your test results, treatment options, and if necessary, the need for more tests. Follow these instructions at home: Eating  and drinking  Eat a diet that includes fresh fruits and vegetables, whole grains, lean protein, and low-fat dairy products. Limit your intake of foods with high amounts of sugar, saturated fats, and salt. Take vitamin and mineral supplements as recommended by your health care provider. Do not drink alcohol if your health care provider tells you not  to drink. If you drink alcohol: Limit how much you have to 0-2 drinks a day. Know how much alcohol is in your drink. In the [place] one drink equals one 12 oz bottle of beer (355 mL), one 5 oz glass of wine (148 mL), or one 1 oz glass of hard liquor (44 mL). Lifestyle Brush your teeth every morning and night with fluoride toothpaste. Floss one time each day. Exercise for at least 30 minutes 5 or more days each week. Do not use any products that contain nicotine or tobacco. These products include cigarettes, chewing tobacco, and vaping devices, such as e-cigarettes. If you need help quitting, ask your health care provider. Do not use drugs. If you are sexually active, practice safe sex. Use a condom or other form of protection to prevent STIs. Take aspirin only as told by your health care provider. Make sure that you understand how much to take and what form to take. Work with your health care provider to find out whether it is safe and beneficial for you to take aspirin daily. Ask your health care provider if you need to take a cholesterol-lowering medicine (statin). Find healthy ways to manage stress, such as: Meditation, yoga, or listening to music. Journaling. Talking to a trusted person. Spending time with friends and family. Safety Always wear your seat belt while driving or riding in a vehicle. Do not drive: If you have been drinking alcohol. Do not ride with someone who has been drinking. When you are tired or distracted. While texting. If you have been using any mind-altering substances or drugs. Wear a helmet and other protective equipment during sports activities. If you have firearms in your house, make sure you follow all gun safety procedures. Minimize exposure to UV radiation to reduce your risk of skin cancer. What's next? Visit your health care provider once a year for an annual wellness visit. Ask your health care provider how often you should have your eyes and teeth  checked. Stay up to date on all vaccines. This information is not intended to replace advice given to you by your health care provider. Make sure you discuss any questions you have with your health care provider. Document Revised: 11/12/2020 Document Reviewed: 11/12/2020 Elsevier Patient Education  [place].

## 2022-02-16 NOTE — Assessment & Plan Note (Signed)
Had an MRI done last year which showed degenerative changes in his C-spine with possible nerve impingement.  He was referred to neurosurgery.  Decided against surgery.  Symptoms are currently manageable.  Does have a positive Tinel sign at left medial epicondyle may have some ulnar neuropathy as well.  We discussed referral to see orthopedics however he deferred for now.  We will continue with watchful waiting.  We discussed reasons to return to care.

## 2022-02-16 NOTE — Assessment & Plan Note (Signed)
Check lipids.  He is on Lipitor 10 mg daily.

## 2022-02-17 LAB — HEPATITIS C ANTIBODY: Hepatitis C Ab: NONREACTIVE

## 2022-02-19 ENCOUNTER — Other Ambulatory Visit: Payer: Self-pay

## 2022-02-19 DIAGNOSIS — R748 Abnormal levels of other serum enzymes: Secondary | ICD-10-CM

## 2022-02-19 DIAGNOSIS — D696 Thrombocytopenia, unspecified: Secondary | ICD-10-CM

## 2022-02-19 DIAGNOSIS — D72819 Decreased white blood cell count, unspecified: Secondary | ICD-10-CM

## 2022-02-19 NOTE — Progress Notes (Signed)
Please inform patient of the following:  White blood cell counts and platelets are down.  I would like to recheck in 1 to 2 weeks.  Please place future order for CBC with differential.  Liver numbers are also quite a bit since last year.  Please place future order for c-Met.  All his other labs are stable and we can recheck in a year.

## 2022-02-25 ENCOUNTER — Other Ambulatory Visit (INDEPENDENT_AMBULATORY_CARE_PROVIDER_SITE_OTHER): Payer: PPO

## 2022-02-25 DIAGNOSIS — D72819 Decreased white blood cell count, unspecified: Secondary | ICD-10-CM

## 2022-02-25 DIAGNOSIS — R748 Abnormal levels of other serum enzymes: Secondary | ICD-10-CM

## 2022-02-25 DIAGNOSIS — D696 Thrombocytopenia, unspecified: Secondary | ICD-10-CM | POA: Diagnosis not present

## 2022-02-25 LAB — CBC WITH DIFFERENTIAL/PLATELET
Basophils Absolute: 0 K/uL (ref 0.0–0.1)
Basophils Relative: 1 % (ref 0.0–3.0)
Eosinophils Absolute: 0.3 K/uL (ref 0.0–0.7)
Eosinophils Relative: 7.3 % — ABNORMAL HIGH (ref 0.0–5.0)
HCT: 43.3 % (ref 39.0–52.0)
Hemoglobin: 14.6 g/dL (ref 13.0–17.0)
Lymphocytes Relative: 36.1 % (ref 12.0–46.0)
Lymphs Abs: 1.5 K/uL (ref 0.7–4.0)
MCHC: 33.8 g/dL (ref 30.0–36.0)
MCV: 103.6 fl — ABNORMAL HIGH (ref 78.0–100.0)
Monocytes Absolute: 0.4 K/uL (ref 0.1–1.0)
Monocytes Relative: 9.6 % (ref 3.0–12.0)
Neutro Abs: 1.9 K/uL (ref 1.4–7.7)
Neutrophils Relative %: 46 % (ref 43.0–77.0)
Platelets: 129 K/uL — ABNORMAL LOW (ref 150.0–400.0)
RBC: 4.18 Mil/uL — ABNORMAL LOW (ref 4.22–5.81)
RDW: 13.5 % (ref 11.5–15.5)
WBC: 4.2 K/uL (ref 4.0–10.5)

## 2022-02-25 LAB — COMPREHENSIVE METABOLIC PANEL WITH GFR
ALT: 79 U/L — ABNORMAL HIGH (ref 0–53)
AST: 71 U/L — ABNORMAL HIGH (ref 0–37)
Albumin: 3.8 g/dL (ref 3.5–5.2)
Alkaline Phosphatase: 120 U/L — ABNORMAL HIGH (ref 39–117)
BUN: 19 mg/dL (ref 6–23)
CO2: 31 meq/L (ref 19–32)
Calcium: 9.1 mg/dL (ref 8.4–10.5)
Chloride: 104 meq/L (ref 96–112)
Creatinine, Ser: 1.15 mg/dL (ref 0.40–1.50)
GFR: 64.29 mL/min
Glucose, Bld: 105 mg/dL — ABNORMAL HIGH (ref 70–99)
Potassium: 4.7 meq/L (ref 3.5–5.1)
Sodium: 139 meq/L (ref 135–145)
Total Bilirubin: 0.7 mg/dL (ref 0.2–1.2)
Total Protein: 7 g/dL (ref 6.0–8.3)

## 2022-02-25 NOTE — Progress Notes (Signed)
Please inform patient of the following:  Blood counts have come back to normal though still has significantly elevated liver numbers.  Recommend we check right upper quadrant abdominal ultrasound to further evaluate liver and gallbladder.

## 2022-03-01 ENCOUNTER — Ambulatory Visit (HOSPITAL_BASED_OUTPATIENT_CLINIC_OR_DEPARTMENT_OTHER)
Admission: RE | Admit: 2022-03-01 | Discharge: 2022-03-01 | Disposition: A | Discharge status: Home / Self Care | Payer: PPO | Source: Ambulatory Visit | Attending: Family Medicine | Admitting: Family Medicine

## 2022-03-01 DIAGNOSIS — R233 Spontaneous ecchymoses: Secondary | ICD-10-CM | POA: Diagnosis not present

## 2022-03-01 DIAGNOSIS — R748 Abnormal levels of other serum enzymes: Secondary | ICD-10-CM | POA: Diagnosis not present

## 2022-03-01 DIAGNOSIS — R945 Abnormal results of liver function studies: Secondary | ICD-10-CM | POA: Diagnosis not present

## 2022-03-01 DIAGNOSIS — L570 Actinic keratosis: Secondary | ICD-10-CM | POA: Diagnosis not present

## 2022-03-01 DIAGNOSIS — L578 Other skin changes due to chronic exposure to nonionizing radiation: Secondary | ICD-10-CM | POA: Diagnosis not present

## 2022-03-01 DIAGNOSIS — D485 Neoplasm of uncertain behavior of skin: Secondary | ICD-10-CM | POA: Diagnosis not present

## 2022-03-04 NOTE — Progress Notes (Signed)
Please inform patient of the following:  Ultrasound confirms fatty liver disease.  Do not need to change treatment plan at this time.  He should continue to work on diet and exercise and we can continue to monitor his liver numbers periodically.  Do not need to do any further imaging at this point.

## 2022-03-06 ENCOUNTER — Other Ambulatory Visit: Payer: Self-pay | Admitting: Cardiovascular Disease

## 2022-03-06 DIAGNOSIS — I10 Essential (primary) hypertension: Secondary | ICD-10-CM

## 2022-04-01 ENCOUNTER — Other Ambulatory Visit: Payer: Self-pay | Admitting: Cardiovascular Disease

## 2022-04-01 DIAGNOSIS — I10 Essential (primary) hypertension: Secondary | ICD-10-CM

## 2022-05-05 ENCOUNTER — Other Ambulatory Visit: Payer: Self-pay | Admitting: Cardiovascular Disease

## 2022-05-05 DIAGNOSIS — I10 Essential (primary) hypertension: Secondary | ICD-10-CM

## 2022-06-02 ENCOUNTER — Other Ambulatory Visit: Payer: Self-pay | Admitting: Cardiovascular Disease

## 2022-06-02 DIAGNOSIS — I10 Essential (primary) hypertension: Secondary | ICD-10-CM

## 2022-06-03 ENCOUNTER — Other Ambulatory Visit: Payer: Self-pay

## 2022-06-03 DIAGNOSIS — I10 Essential (primary) hypertension: Secondary | ICD-10-CM

## 2022-06-03 MED ORDER — METOPROLOL SUCCINATE ER 50 MG PO TB24: ORAL_TABLET | ORAL | 0 refills | Status: DC

## 2022-06-03 NOTE — Progress Notes (Deleted)
Cardiology Office Note Date:  06/03/2022  Patient ID:  Alfred [name], Alfred [name] 04-Jul-1950, MRN [id number] PCP:  Alfred Barrack, MD  Cardiologist:  Sherren Mocha, MD Electrophysiologist: None  ***refresh   Chief Complaint: ***  History of Present Illness: Alfred [name] is a 72 y.o. male with PMH notable for ***; seen today for None for {VISITTYPE:28148}    Device Information: ***  AAD History: ***  Past Medical History:  Diagnosis Date   AAA (abdominal aortic aneurysm) ([place]) 08/2011   see Surgical history   Anginal pain ([place])    Arthritis    "knees; not bad"   CAD (coronary artery disease)    DES to RCA and CFX; Dr. Burt Knack cardiologist   Depression ~ 2009   'treated for ~ 6 months"   Dyslipidemia    HTN (hypertension)    unspecified essential   Hx of adenomatous colonic polyps 2008   Hyperplasia of prostate without lower urinary tract symptoms (LUTS)    Left lumbar radiculopathy    Other and unspecified hyperlipidemia    Stroke [facility])     Past Surgical History:  Procedure Laterality Date   ABDOMINAL AORTIC ANEURYSM REPAIR  09/06/2011   ABDOMINAL AORTIC ANEURYSM REPAIR  01/21/2012   Procedure: ANEURYSM ABDOMINAL AORTIC REPAIR;  Surgeon: Elam Dutch, MD;  [facility] OR;  Service: Vascular;  Laterality: N/A;  Aortio- bi-iliac repair  using Cryolife descending thoracic aorta 64m x10.5 cm.   AC[place]ax  ARTERY REPAIR  09/06/2011   Procedure: ARTERY REPAIR;  Surgeon: CElam Dutch MD;  [facility]: [facility]  Service: Vascular;  Laterality: Bilateral;   COLONOSCOPY W/ POLYPECTOMY  2008   adenomatous  & hyperplastic polyp;due 2013   CORONARY ANGIOPLASTY WITH STENT PLACEMENT  2002; 2005   "2 +1; total of 3"   CYSTOSCOPY  2005   varicose veins in bladder, Dr dDiona Fanti  TONSILLECTOMY AND ADENOIDECTOMY  1954   VASECTOMY      Current Outpatient Medications  Medication Sig Dispense Refill   acetaminophen (TYLENOL) 500 MG tablet Take 500 mg by mouth every 6  (six) hours as needed for mild pain. PRN for pain/fever     aspirin 81 MG tablet Take 81 mg by mouth at bedtime.     atorvastatin (LIPITOR) 10 MG tablet Take 1 tablet (10 mg total) by mouth daily. 90 tablet 3   clopidogrel (PLAVIX) 75 MG tablet TAKE 1 TABLET BY MOUTH EVERY DAY 90 tablet 3   metoprolol succinate (TOPROL-XL) 50 MG 24 hr tablet TAKE 1 TABLET BY MOUTH EVERY DAY WITH OR IMMEDIATELY FOLLOWING A MEAL 30 tablet 0   valsartan (DIOVAN) 160 MG tablet Take 1 tablet (160 mg total) by mouth daily. 90 tablet 3   No current facility-administered medications for this visit.    Allergies:   Patient has no known allergies.   Social History:  The patient  reports that he quit smoking about 29 years ago. His smoking use included cigarettes. He has a 27.00 pack-year smoking history. His smokeless tobacco use includes snuff. He reports current alcohol use of about 21.0 standard drinks of alcohol per week. He reports that he does not use drugs.   Family History:  The patient's family history includes Colon polyps in his father; Coronary artery disease in his father, maternal grandfather, and paternal grandfather; Diabetes in his father; Hypertension in his father; Lung cancer in his father; Other in his mother.***  ROS:  Please see the history  of present illness. All other systems are reviewed and otherwise negative.   PHYSICAL EXAM: *** VS:  There were no vitals taken for this visit. BMI: There is no height or weight on file to calculate BMI.  GEN- The patient is well appearing, alert and oriented x 3 today.   HEENT: normocephalic, atraumatic; sclera clear, conjunctiva pink; hearing intact; oropharynx clear; neck supple, no JVP Lungs- Clear to ausculation bilaterally, normal work of breathing.  No wheezes, rales, rhonchi Heart- {Blank single:19197::"Regular","Irregularly irregular"} rate and rhythm, no murmurs, rubs or gallops, PMI not laterally displaced GI- soft, non-tender, non-distended, bowel  sounds present, no hepatosplenomegaly Extremities- {EDEMA KVQQV:95638} peripheral edema. no clubbing or cyanosis; DP/PT/radial pulses 2+ bilaterally MS- no significant deformity or atrophy Skin- warm and dry, no rash or lesion Psych- euthymic mood, full affect Neuro- strength and sensation are intact   Device interrogation done today and reviewed by myself:  Battery *** Lead thresholds, impedence, sensing stable *** *** episodes *** changes made today  EKG {ACTION; IS/IS VFI:[id number]} ordered. Personal review of EKG from {Blank single:19197::"today","***"} shows:  ***  Recent Labs: 02/16/2022: TSH 2.28 02/25/2022: ALT 79; BUN 19; Creatinine, Ser 1.15; Hemoglobin 14.6; Platelets 129.0; Potassium 4.7; Sodium 139  02/16/2022: Cholesterol 124; HDL 46.80; LDL Cholesterol 59; Total CHOL/HDL Ratio 3; Triglycerides 89.0; VLDL 17.8   CrCl cannot be calculated (Patient's most recent lab result is older than the maximum 21 days allowed.).   Wt Readings from Last 3 Encounters:  02/16/22 (!) 314 lb 6.4 oz (142.6 kg)  09/21/21 (!) 310 lb (140.6 kg)  06/19/21 (!) 310 lb (140.6 kg)     Additional studies reviewed include: Previous EP notes.   ASSESSMENT AND PLAN:  #) ***   #) ***    Current medicines are reviewed at length with the patient today.   The patient {ACTIONS; HAS/DOES NOT HAVE:19233} concerns regarding his medicines.  The following changes were made today:  {NONE DEFAULTED:18576}  Labs/ tests ordered today include: *** No orders of the defined types were placed in this encounter.    Disposition: Follow up with {EPMDS:28135} in {EPFOLLOW UP:28173}   Signed, Alfred Levers, NP  06/03/22 4:46 PM   [place] [address] [phone] (office)  [phone] (fax)

## 2022-06-04 ENCOUNTER — Ambulatory Visit: Payer: PPO | Admitting: Physician Assistant

## 2022-06-04 ENCOUNTER — Other Ambulatory Visit: Payer: Self-pay | Admitting: Cardiovascular Disease

## 2022-06-04 DIAGNOSIS — I2510 Atherosclerotic heart disease of native coronary artery without angina pectoris: Secondary | ICD-10-CM
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
## 2022-06-22 ENCOUNTER — Ambulatory Visit: Payer: PPO | Admitting: Physician Assistant

## 2022-06-22 ENCOUNTER — Ambulatory Visit: Payer: PPO | Admitting: Nurse Practitioner

## 2022-06-22 ENCOUNTER — Encounter: Payer: Self-pay | Admitting: Cardiovascular Disease

## 2022-06-22 ENCOUNTER — Ambulatory Visit: Payer: PPO | Attending: Nurse Practitioner | Admitting: Cardiovascular Disease

## 2022-06-22 VITALS — BP 130/90 | HR 59 | Ht 72.0 in | Wt 314.8 lb

## 2022-06-22 DIAGNOSIS — I10 Essential (primary) hypertension: Secondary | ICD-10-CM | POA: Diagnosis not present

## 2022-06-22 DIAGNOSIS — I2510 Atherosclerotic heart disease of native coronary artery without angina pectoris: Secondary | ICD-10-CM

## 2022-06-22 DIAGNOSIS — E782 Mixed hyperlipidemia: Secondary | ICD-10-CM | POA: Diagnosis not present

## 2022-06-22 DIAGNOSIS — I7143 Infrarenal abdominal aortic aneurysm, without rupture: Secondary | ICD-10-CM | POA: Diagnosis not present

## 2022-06-22 LAB — COMPREHENSIVE METABOLIC PANEL
ALT: 74 IU/L — ABNORMAL HIGH (ref 0–44)
AST: 58 IU/L — ABNORMAL HIGH (ref 0–40)
Albumin/Globulin Ratio: 1.5 (ref 1.2–2.2)
Albumin: 4.1 g/dL (ref 3.8–4.8)
Alkaline Phosphatase: 134 IU/L — ABNORMAL HIGH (ref 44–121)
BUN/Creatinine Ratio: 16 (ref 10–24)
BUN: 16 mg/dL (ref 8–27)
Bilirubin Total: 0.8 mg/dL (ref 0.0–1.2)
CO2: 26 mmol/L (ref 20–29)
Calcium: 9.3 mg/dL (ref 8.6–10.2)
Chloride: 103 mmol/L (ref 96–106)
Creatinine, Ser: 1.03 mg/dL (ref 0.76–1.27)
Globulin, Total: 2.7 g/dL (ref 1.5–4.5)
Glucose: 119 mg/dL — ABNORMAL HIGH (ref 70–99)
Potassium: 4.5 mmol/L (ref 3.5–5.2)
Sodium: 139 mmol/L (ref 134–144)
Total Protein: 6.8 g/dL (ref 6.0–8.5)
eGFR: 78 mL/min/{1.73_m2} (ref >59)

## 2022-06-22 MED ORDER — ATORVASTATIN CALCIUM 10 MG PO TABS: 10.0000 mg | ORAL_TABLET | Freq: Every day | ORAL | 3 refills | Status: DC

## 2022-06-22 MED ORDER — CLOPIDOGREL BISULFATE 75 MG PO TABS: 75.0000 mg | ORAL_TABLET | Freq: Every day | ORAL | 3 refills | Status: DC

## 2022-06-22 MED ORDER — METOPROLOL SUCCINATE ER 50 MG PO TB24: ORAL_TABLET | ORAL | 3 refills | Status: DC

## 2022-06-22 MED ORDER — VALSARTAN 160 MG PO TABS: ORAL_TABLET | ORAL | 3 refills | Status: DC

## 2022-06-22 NOTE — Patient Instructions (Signed)
Medication Instructions:  Your physician recommends that you continue on your current medications as directed. Please refer to the Current Medication list given to you today.  *If you need a refill on your cardiac medications before your next appointment, please call your pharmacy*   Lab Work: CMET today If you have labs (blood work) drawn today and your tests are completely normal, you will receive your results only by: [facility] (if you have MyChart) OR A paper copy in the mail If you have any lab test that is abnormal or we need to change your treatment, we will call you to review the results.   Testing/Procedures: NONE   Follow-Up: At [facility], you and your health needs are our priority.  As part of our continuing mission to provide you with exceptional heart care, we have created designated Provider Care Teams.  These Care Teams include your primary Cardiologist (physician) and Advanced Practice Providers (APPs -  Physician Assistants and Nurse Practitioners) who all work together to provide you with the care you need, when you need it.  We recommend signing up for the patient portal called "MyChart".  Sign up information is provided on this After Visit Summary.  MyChart is used to connect with patients for Virtual Visits (Telemedicine).  Patients are able to view lab/test results, encounter notes, upcoming appointments, etc.  Non-urgent messages can be sent to your provider as well.   To learn more about what you can do with MyChart, go to [email or web address].    Your next appointment:   1 year(s)  Provider:   Sherren Mocha, MD

## 2022-06-22 NOTE — Progress Notes (Signed)
Cardiology Office Note:    Date:  06/22/2022   ID:  Alfred [name], DOB Jul 31, 1950, MRN [id number]  PCP:  Vivi Barrack, MD   [place] [facility] Prov[facility] Cardiologist:  Sherren Mocha, MD     Referring MD: Vivi Barrack, MD   Chief Complaint  Patient presents with   Coronary Artery Disease    History of Present Illness:    Alfred [name] is a 72 y.o. male with a hx of: Coronary artery disease  Hx of Cypher DES to RCA, PDA and LCx in 09/2003 Hypertension  Hyperlipidemia  Abdominal aortic aneurysm  S/p Repair in 2013 BPH Hx of CVA Hepatic steatosis (hx of elevated LFTs)  The patient is here alone today. He has been doing pretty well. Thinks his BP is elevated today because he got in an argument this morning with his soon to be ex-wife. He hasn't had any recent angina with rest or with activity. He does report in some 'aches' in the left arm and shoulder that he attributes to cervical disk disease. He otherwise remains active without cardiac limitation.   Past Medical History:  Diagnosis Date   AAA (abdominal aortic aneurysm) ([place]) 08/2011   see Surgical history   Anginal pain ([place])    Arthritis    "knees; not bad"   CAD (coronary artery disease)    DES to RCA and CFX; Dr. Burt Knack cardiologist   Depression ~ 2009   'treated for ~ 6 months"   Dyslipidemia    HTN (hypertension)    unspecified essential   Hx of adenomatous colonic polyps 2008   Hyperplasia of prostate without lower urinary tract symptoms (LUTS)    Left lumbar radiculopathy    Other and unspecified hyperlipidemia    Stroke [facility])     Past Surgical History:  Procedure Laterality Date   ABDOMINAL AORTIC ANEURYSM REPAIR  09/06/2011   ABDOMINAL AORTIC ANEURYSM REPAIR  01/21/2012   Procedure: ANEURYSM ABDOMINAL AORTIC REPAIR;  Surgeon: Elam Dutch, MD;  Location: [facility] OR;  Service: Vascular;  Laterality: N/A;  Aortio- bi-iliac repair  using Cryolife descending thoracic aorta 45m x10.5 cm.    [place]  ARTERY REPAIR  09/06/2011   Procedure: ARTERY REPAIR;  Surgeon: CElam Dutch MD;  [place] [facility]  Service: Vascular;  Laterality: Bilateral;   COLONOSCOPY W/ POLYPECTOMY  2008   adenomatous  & hyperplastic polyp;due 2013   CORONARY ANGIOPLASTY WITH STENT PLACEMENT  2002; 2005   "2 +1; total of 3"   CYSTOSCOPY  2005   varicose veins in bladder, Dr dDiona Fanti  TONSILLECTOMY AND ADENOIDECTOMY  1954   VASECTOMY      Current Medications: Current Meds  Medication Sig   acetaminophen (TYLENOL) 500 MG tablet Take 500 mg by mouth every 6 (six) hours as needed for mild pain. PRN for pain/fever   aspirin 81 MG tablet Take 81 mg by mouth at bedtime.   [DISCONTINUED] atorvastatin (LIPITOR) 10 MG tablet Take 1 tablet (10 mg total) by mouth daily. Patient needs to keep appt for # 90 supply.   [DISCONTINUED] clopidogrel (PLAVIX) 75 MG tablet Take 1 tablet (75 mg total) by mouth daily. Pt needs to keep appointment for # 961   [DISCONTINUED] metoprolol succinate (TOPROL-XL) 50 MG 24 hr tablet TAKE 1 TABLET BY MOUTH EVERY DAY WITH OR IMMEDIATELY FOLLOWING A MEAL   [DISCONTINUED] valsartan (DIOVAN) 160 MG tablet Take one (1) tablet by mouth ( 160 mg) daily. Pt needs  to keep appt for # 90.     Allergies:   Patient has no known allergies.   Social History   Socioeconomic History   Marital status: Married    Spouse name: Not on file   Number of children: Not on file   Years of education: Not on file   Highest education level: Not on file  Occupational History   Occupation: Farming  Tobacco Use   Smoking status: Former    Packs/day: 1.00    Years: 27.00    Total pack years: 27.00    Types: Cigarettes    Quit date: 05/31/1993    Years since quitting: 29.0   Smokeless tobacco: Current    Types: Snuff   Tobacco comments:    smoked 1968-1995, up to 1 ppd. Currently using Sko[place] 1 can / day  Vaping Use   Vaping Use: Never used  Substance and Sexual Activity   Alcohol use:  Yes    Alcohol/week: 21.0 standard drinks of alcohol    Types: 21 Shots of liquor per week    Comment: 2-3 drinks of Vodka a night, occasional beer   Drug use: No   Sexual activity: Yes  Other Topics Concern   Not on file  Social History Narrative   Not on file   Social Determinants of Health   Financial Resource Strain: Low Risk  (06/19/2021)   Overall Financial Resource Strain (CARDIA)    Difficulty of Paying Living Expenses: Not hard at all  Food Insecurity: No Food Insecurity (06/19/2021)   Hunger Vital Sign    Worried About Running Out of Food in the Last Year: Never true    Ran Out of Food in the Last Year: Never true  Transportation Needs: No Transportation Needs (06/19/2021)   PRAPARE - Hydrologist (Medical): No    Lack of Transportation (Non-Medical): No  Physical Activity: Inactive (06/19/2021)   Exercise Vital Sign    Days of Exercise per Week: 0 days    Minutes of Exercise per Session: 0 min  Stress: No Stress Concern Present (06/19/2021)   [place]    Feeling of Stress : Only a Alfred  Social Connections: Moderately Isolated (06/19/2021)   Social Connection and Isolation Panel [NHANES]    Frequency of Communication with Friends and Family: Twice a week    Frequency of Social Gatherings with Friends and Family: Three times a week    Attends Religious Services: Never    Active Member of Clubs or Organizations: No    Attends Music therapist: Never    Marital Status: Married     Family History: The patient's family history includes Colon polyps in his father; Coronary artery disease in his father, maternal grandfather, and paternal grandfather; Diabetes in his father; Hypertension in his father; Lung cancer in his father; Other in his mother. There is no history of Anesthesia problems, Stroke, Colon cancer, Esophageal cancer, Rectal cancer, or Stomach  cancer.  ROS:   Please see the history of present illness.    All other systems reviewed and are negative.  EKGs/Labs/Other Studies Reviewed:    The following studies were reviewed today: Myocardial Perfusion Study 04/23:   The study is normal. The study is low risk.   No ST deviation was noted.   Left ventricular function is normal. End diastolic cavity size is normal.   Prior study available for comparison from 05/09/2020.   Normal stress  nuclear study with mild thinning of the inferior apical wall but no ischemia.  Gated ejection fraction 58% with normal wall motion.  Liver US 10/23: IMPRESSION: 1. Increased hepatic parenchymal echogenicity suggestive of steatosis. 2. No cholelithiasis or sonographic evidence for acute cholecystitis.  EKG:  EKG is ordered today.  The ekg ordered today demonstrates NSR 59 bpm, minimal voltage criteria for LVH, otherwise normal  Recent Labs: 02/16/2022: TSH 2.28 02/25/2022: ALT 79; BUN 19; Creatinine, Ser 1.15; Hemoglobin 14.6; Platelets 129.0; Potassium 4.7; Sodium 139  Recent Lipid Panel    Component Value Date/Time   CHOL 124 02/16/2022 0831   CHOL 134 01/09/2019 0956   TRIG 89.0 02/16/2022 0831   TRIG 110 05/26/2006 1118   HDL 46.80 02/16/2022 0831   HDL 50 01/09/2019 0956   CHOLHDL 3 02/16/2022 0831   VLDL 17.8 02/16/2022 0831   LDLCALC 59 02/16/2022 0831   LDLCALC 70 01/28/2020 0913   LDLDIRECT 78.1 01/15/2013 0747     Risk Assessment/Calculations:            Physical Exam:    VS:  BP (!) 130/90   Pulse (!) 59   Ht 6' (1.829 m)   Wt (!) 314 lb 12.8 oz (142.8 kg)   SpO2 99%   BMI 42.69 kg/m     Wt Readings from Last 3 Encounters:  06/22/22 (!) 314 lb 12.8 oz (142.8 kg)  02/16/22 (!) 314 lb 6.4 oz (142.6 kg)  09/21/21 (!) 310 lb (140.6 kg)     GEN:  Well nourished, well developed pleasant obese male in no acute distress HEENT: Normal NECK: No JVD; No carotid bruits LYMPHATICS: No lymphadenopathy CARDIAC: RRR,  no murmurs, rubs, gallops RESPIRATORY:  Clear to auscultation without rales, wheezing or rhonchi  ABDOMEN: Soft, non-tender, non-distended MUSCULOSKELETAL:  No edema; No deformity  SKIN: Warm and dry NEUROLOGIC:  Alert and oriented x 3 PSYCHIATRIC:  Normal affect   ASSESSMENT:    1. Mixed hyperlipidemia   2. Coronary artery disease involving native coronary artery of native heart without angina pectoris   3. HYPERTENSION, BENIGN   4. Infrarenal abdominal aortic aneurysm (AAA) without rupture (HCC)    PLAN:    In order of problems listed above:  Treated with atorvastatin.  LDL 59, HDL 47, total cholesterol 124.  LFTs have been elevated approximately 2 times upper limits of normal.  He also drinks alcohol every day with 2 vodka drinks before dinner.  He is not inclined to change this.  Will follow-up LFTs today and continue current therapy.  I reviewed his liver ultrasound demonstrating hepatic steatosis. Stable without angina. Aspirin and clopidogrel long-term after first generation drug-eluting stents. Most recent nuclear stress study reviewed as above with no ischemia.  BP controlled (elevated this am due to high stress) metoprolol succinate and valsartan. Followed closely by vascular surgery           Medication Adjustments/Labs and Tests Ordered: Current medicines are reviewed at length with the patient today.  Concerns regarding medicines are outlined above.  Orders Placed This Encounter  Procedures   Comprehensive metabolic panel   EKG 14-GYJE   Meds ordered this encounter  Medications   atorvastatin (LIPITOR) 10 MG tablet    Sig: Take 1 tablet (10 mg total) by mouth daily.    Dispense:  90 tablet    Refill:  3   clopidogrel (PLAVIX) 75 MG tablet    Sig: Take 1 tablet (75 mg total) by mouth daily.  Dispense:  90 tablet    Refill:  3   metoprolol succinate (TOPROL-XL) 50 MG 24 hr tablet    Sig: TAKE 1 TABLET BY MOUTH EVERY DAY WITH OR IMMEDIATELY FOLLOWING A MEAL     Dispense:  90 tablet    Refill:  3   valsartan (DIOVAN) 160 MG tablet    Sig: Take one (1) tablet by mouth (160 mg) daily    Dispense:  90 tablet    Refill:  3    Patient Instructions  Medication Instructions:  Your physician recommends that you continue on your current medications as directed. Please refer to the Current Medication list given to you today.  *If you need a refill on your cardiac medications before your next appointment, please call your pharmacy*   Lab Work: CMET today If you have labs (blood work) drawn today and your tests are completely normal, you will receive your results only by: [place] (if you have MyChart) OR A paper copy in the mail If you have any lab test that is abnormal or we need to change your treatment, we will call you to review the results.   Testing/Procedures: NONE   Follow-Up: At [facility], you and your health needs are our priority.  As part of our continuing mission to provide you with exceptional heart care, we have created designated Provider Care Teams.  These Care Teams include your primary Cardiologist (physician) and Advanced Practice Providers (APPs -  Physician Assistants and Nurse Practitioners) who all work together to provide you with the care you need, when you need it.  We recommend signing up for the patient portal called "MyChart".  Sign up information is provided on this After Visit Summary.  MyChart is used to connect with patients for Virtual Visits (Telemedicine).  Patients are able to view lab/test results, encounter notes, upcoming appointments, etc.  Non-urgent messages can be sent to your provider as well.   To learn more about what you can do with MyChart, go to [email or web address].    Your next appointment:   1 year(s)  Provider:   Sherren Mocha, MD        Signed, Sherren Mocha, MD  06/22/2022 12:43 PM    [place]

## 2022-06-28 ENCOUNTER — Ambulatory Visit (INDEPENDENT_AMBULATORY_CARE_PROVIDER_SITE_OTHER): Payer: PPO

## 2022-06-28 DIAGNOSIS — Z0000 Encounter for general adult medical examination without abnormal findings: Secondary | ICD-10-CM

## 2022-06-28 NOTE — Progress Notes (Signed)
I connected with  [name] on 06/28/22 by a audio enabled telemedicine application and verified that I am speaking with the correct person using two identifiers.  Patient Location: Home  Provider Location: Office/Clinic  I discussed the limitations of evaluation and management by telemedicine. The patient expressed understanding and agreed to proceed.  Subjective:   Alfred [name] is a 72 y.o. male who presents for Medicare Annual/Subsequent preventive examination.  Review of Systems     Cardiac Risk Factors include: advanced age (>76mn, >>1women);dyslipidemia;obesity (BMI >30kg/m2);hypertension;male gender     Objective:    There were no vitals filed for this visit. There is no height or weight on file to calculate BMI.     06/28/2022    8:08 AM 06/19/2021    8:12 AM 06/12/2020    8:15 AM 04/30/2019   12:13 PM 01/22/2016   12:16 PM 01/09/2015    9:18 AM 01/21/2012    3:00 AM  Advanced Directives  Does Patient Have a Medical Advance Directive? Yes Yes Yes Yes Yes Yes Patient has advance directive, copy not in chart  Type of Advance Directive HFort Ir[place]Living will Healthcare Power of Attorney Living will Living will;Healthcare Power of [place] will [place] will [place] Does patient want to make changes to medical advance directive?    No - Patient declined  No - Patient declined   Copy of HForsythin Chart? No - copy requested No - copy requested  No - copy requested   Copy requested from other (Comment)  Pre-existing out of facility DNR order (yellow form or pink MOST form)       No    Current Medications (verified) Outpatient Encounter Medications as of 06/28/2022  Medication Sig   acetaminophen (TYLENOL) 500 MG tablet Take 500 mg by mouth every 6 (six) hours as needed for mild pain. PRN for pain/fever   aspirin 81 MG tablet Take 81 mg by mouth at  bedtime.   atorvastatin (LIPITOR) 10 MG tablet Take 1 tablet (10 mg total) by mouth daily.   clopidogrel (PLAVIX) 75 MG tablet Take 1 tablet (75 mg total) by mouth daily.   metoprolol succinate (TOPROL-XL) 50 MG 24 hr tablet TAKE 1 TABLET BY MOUTH EVERY DAY WITH OR IMMEDIATELY FOLLOWING A MEAL   valsartan (DIOVAN) 160 MG tablet Take one (1) tablet by mouth (160 mg) daily   No facility-administered encounter medications on file as of 06/28/2022.    Allergies (verified) Patient has no known allergies.   History: Past Medical History:  Diagnosis Date   [place] [place]   see Surgical history   Anginal pain ([place]    Arthritis    "knees; not bad"   CAD (coronary artery disease)    DES to RCA and CFX; Dr. CBurt Knackcardiologist   Depression ~ 2009   'treated for ~ 6 months"   Dyslipidemia    HTN (hypertension)    unspecified essential   Hx of adenomatous colonic polyps 2008   Hyperplasia of prostate without lower urinary tract symptoms (LUTS)    Left lumbar radiculopathy    Other and unspecified hyperlipidemia    Stroke ([facility]    Past Surgical History:  Procedure Laterality Date   ABDOMINAL AORTIC ANEURYSM REPAIR  09/06/2011   ABDOMINAL AORTIC ANEURYSM REPAIR  01/21/2012   Procedure: ANEURYSM ABDOMINAL AORTIC REPAIR;  Surgeon: CElam Dutch MD;  Location:  [facility] OR;  Service: Vascular;  Laterality: N/A;  Aortio- bi-iliac repair  using Cryolife descending thoracic aorta 71m x10.5 cm.   APPENDECTOMY  1970   ARTERY REPAIR  09/06/2011   Procedure: ARTERY REPAIR;  Surgeon: CElam Dutch MD;  [facility]  Service: Vascular;  Laterality: Bilateral;   COLONOSCOPY W/ POLYPECTOMY  2008   adenomatous  & hyperplastic polyp;due 2013   CORONARY ANGIOPLASTY WITH STENT PLACEMENT  2002; 2005   "2 +1; total of 3"   CYSTOSCOPY  2005   varicose veins in bladder, Dr dDiona Fanti  TONSILLECTOMY AND ADENOIDECTOMY  1954   VASECTOMY     Family History  Problem Relation  Age of Onset   Coronary artery disease Father        CABG in 611s  Diabetes Father    Colon polyps Father    Lung cancer Father        non-small cell carcinoma/bone CA   Hypertension Father    Coronary artery disease Maternal Grandfather    Coronary artery disease Paternal Grandfather    Other Mother        varicose veins   Anesthesia problems Neg Hx    Stroke Neg Hx    Colon cancer Neg Hx    Esophageal cancer Neg Hx    Rectal cancer Neg Hx    Stomach cancer Neg Hx    Social History   Socioeconomic History   Marital status: Married    Spouse name: Not on file   Number of children: Not on file   Years of education: Not on file   Highest education level: Not on file  Occupational History   Occupation: Farming  Tobacco Use   Smoking status: Former    Packs/day: 1.00    Years: 27.00    Total pack years: 27.00    Types: Cigarettes    Quit date: 05/31/1993    Years since quitting: 29.0   Smokeless tobacco: Current    Types: Snuff   Tobacco comments:    smoked 1968-1995, up to 1 ppd. Currently using Skoal 1 can / day  Vaping Use   Vaping Use: Never used  Substance and Sexual Activity   Alcohol use: Yes    Alcohol/week: 21.0 standard drinks of alcohol    Types: 21 Shots of liquor per week    Comment: 2-3 drinks of Vodka a night, occasional beer   Drug use: No   Sexual activity: Yes  Other Topics Concern   Not on file  Social History Narrative   Not on file   Social Determinants of Health   Financial Resource Strain: Low Risk  (06/19/2021)   Overall Financial Resource Strain (CARDIA)    Difficulty of Paying Living Expenses: Not hard at all  Food Insecurity: No Food Insecurity (06/19/2021)   Hunger Vital Sign    Worried About Running Out of Food in the Last Year: Never true    Ran Out of Food in the Last Year: Never true  Transportation Needs: No Transportation Needs (06/19/2021)   PRAPARE - THydrologist(Medical): No    Lack of  Transportation (Non-Medical): No  Physical Activity: Inactive (06/19/2021)   Exercise Vital Sign    Days of Exercise per Week: 0 days    Minutes of Exercise per Session: 0 min  Stress: No Stress Concern Present (06/19/2021)   [place]   Feeling of Stress :  Only a little  Social Connections: Unknown (06/24/2022)   Social Connection and Isolation Panel [NHANES]    Frequency of Communication with Friends and Family: Not on file    Frequency of Social Gatherings with Friends and Family: Not on file    Attends Religious Services: Never    Marine scientist or Organizations: Not on file    Attends Archivist Meetings: Never    Marital Status: Not on file    Tobacco Counseling Ready to quit: Not Answered Counseling given: Not Answered Tobacco comments: smoked 1968-1995, up to 1 ppd. Currently using Skoal 1 can / day   Clinical Intake:  Pre-visit preparation completed: Yes  Pain : No/denies pain     BMI - recorded: 42.69 Nutritional Status: BMI > 30  Obese Nutritional Risks: None Diabetes: No  How often do you need to have someone help you when you read instructions, pamphlets, or other written materials from your doctor or pharmacy?: 1 - Never  Diabetic?no  Interpreter Needed?: No  Information entered by :: Charlott Rakes, LPN   Activities of Daily Living    06/24/2022    4:23 PM  In your present state of health, do you have any difficulty performing the following activities:  Hearing? 0  Vision? 0  Difficulty concentrating or making decisions? 0  Walking or climbing stairs? 0  Dressing or bathing? 0  Doing errands, shopping? 0  Preparing Food and eating ? N  Using the Toilet? N  In the past six months, have you accidently leaked urine? Y  Comment at times  Do you have problems with loss of bowel control? N  Managing your Medications? N  Managing your Finances? N  Housekeeping or  managing your Housekeeping? N    Patient Care Team: Vivi Barrack, MD as PCP - General (Family Medicine) Sherren Mocha, MD as PCP - Cardiology (Cardiology) [name], [facility] (Inactive) as Pharmacist (Pharmacist)  Indicate any recent Medical Services you may have received from other than [facility] providers in the past year (date may be approximate).     Assessment:   This is a routine wellness examination for Laurance.  Hearing/Vision screen Hearing Screening - Comments:: Pt stated slight loss  Vision Screening - Comments:: Pt [facility] for [facility] exams   Dietary issues and exercise activities discussed: Current Exercise Habits: The patient has a physically strenuous job, but has no regular exercise apart from work.   Goals Addressed             This Visit's Progress    Patient Stated       Lose weight        Depression Screen    06/28/2022    8:07 AM 02/16/2022    7:54 AM 06/19/2021    8:10 AM 01/27/2021   11:15 AM 06/12/2020    8:11 AM 04/30/2019   12:14 PM 01/25/2019    8:05 AM  PHQ 2/9 Scores  PHQ - 2 Score 0 0 0 0 0 0 0    Fall Risk    06/24/2022    4:23 PM 02/16/2022    7:55 AM 06/19/2021    8:14 AM 01/27/2021   11:16 AM 06/12/2020    8:18 AM  Fall Risk   Falls in the past year? 0 0 0 0 0  Number falls in past yr: 0 0 0 0 0  Injury with Fall? 0 0 0 0 0  Risk for fall due to :  Impaired vision No Fall Risks Impaired vision No Fall Risks Impaired vision  Follow up   Falls prevention discussed  Falls prevention discussed    FALL RISK PREVENTION PERTAINING TO THE HOME:  Any stairs in or around the home? Yes  If so, are there any without handrails? No  Home free of loose throw rugs in walkways, pet beds, electrical cords, etc? Yes  Adequate lighting in your home to reduce risk of falls? Yes   ASSISTIVE DEVICES UTILIZED TO PREVENT FALLS:  Life alert? No  Use of a cane, walker or w/c? No  Grab bars in the bathroom? No  Shower chair or  bench in shower? No  Elevated toilet seat or a handicapped toilet? No   TIMED UP AND GO:  Was the test performed? No .   Cognitive Function:        06/28/2022    8:10 AM 06/19/2021    8:19 AM 06/12/2020    8:21 AM  6CIT Screen  What Year? 0 points 0 points 0 points  What month? 0 points 0 points 0 points  What time? 0 points 0 points   Count back from 20 0 points 0 points 0 points  Months in reverse 0 points 0 points 0 points  Repeat phrase 0 points 0 points 0 points  Total Score 0 points 0 points     Immunizations Immunization History  Administered Date(s) Administered   Fluad Quad(high Dose 65+) 01/25/2019, 02/16/2022   PFIZER(Purple Top)SARS-COV-2 Vaccination 08/02/2019, 09/04/2019, 05/29/2020   Pneumococcal Conjugate-13 01/25/2018   Pneumococcal Polysaccharide-23 01/27/2021   Zoster Recombinat (Shingrix) 06/08/2020, 10/11/2020    TDAP status: Due, Education has been provided regarding the importance of this vaccine. Advised may receive this vaccine at local pharmacy or Health Dept. Aware to provide a copy of the vaccination record if obtained from local pharmacy or Health Dept. Verbalized acceptance and understanding.  Flu Vaccine status: Up to date  Pneumococcal vaccine status: Up to date  Covid-19 vaccine status: Completed vaccines  Qualifies for Shingles Vaccine? Yes   Zostavax completed Yes   Shingrix Completed?: Yes  Screening Tests Health Maintenance  Topic Date Due   DTaP/Tdap/Td (1 - Tdap) Never done   COVID-19 Vaccine (4 - 2023-24 season) 01/29/2022   Medicare Annual Wellness (AWV)  06/29/2023   COLONOSCOPY (Pts 45-41yr Insurance coverage will need to be confirmed)  10/23/2023   Pneumonia Vaccine 72 Years old  Completed   INFLUENZA VACCINE  Completed   Hepatitis C Screening  Completed   Zoster Vaccines- Shingrix  Completed   HPV VACCINES  Aged Out    Health Maintenance  Health Maintenance Due  Topic Date Due   DTaP/Tdap/Td (1 - Tdap) Never  done   COVID-19 Vaccine (4 - 2023-24 season) 01/29/2022    Colorectal cancer screening: Type of screening: Colonoscopy. Completed 10/22/20. Repeat every 3 years   Additional Screening:  Hepatitis C Screening:  Completed 02/16/22  Vision Screening: Recommended annual ophthalmology exams for early detection of glaucoma and other disorders of the eye. Is the patient up to date with their annual eye exam?  Yes  Who is the provider or what is the name of the office in which the patient attends annual eye exams? [facility]  If pt is not established with a provider, would they like to be referred to a provider to establish care? No .   Dental Screening: Recommended annual dental exams for proper oral hygiene  Community Resource Referral / Chronic Care Management:  CRR required this visit?  No   CCM required this visit?  No      Plan:     I have personally reviewed and noted the following in the patient's chart:   Medical and social history Use of alcohol, tobacco or illicit drugs  Current medications and supplements including opioid prescriptions. Patient is not currently taking opioid prescriptions. Functional ability and status Nutritional status Physical activity Advanced directives List of other physicians Hospitalizations, surgeries, and ER visits in previous 12 months Vitals Screenings to include cognitive, depression, and falls Referrals and appointments  In addition, I have reviewed and discussed with patient certain preventive protocols, quality metrics, and best practice recommendations. A written personalized care plan for preventive services as well as general preventive health recommendations were provided to patient.     Willette Brace, LPN   5/39/1225   Nurse Notes: none

## 2022-06-28 NOTE — Patient Instructions (Signed)
Alfred [name] , Thank you for taking time to come for your Medicare Wellness Visit. I appreciate your ongoing commitment to your health goals. Please review the following plan we discussed and let me know if I can assist you in the future.   These are the goals we discussed:  Goals      Patient Stated     Alfred [name] (see longitudinal plan of care for additional care plan information)  Current Barriers:  Chronic Disease Management support, education, and care coordination needs related to Hypertension, Hyperlipidemia, and Coronary Artery Disease   Hypertension BP Readings from Last 3 Encounters:  01/25/19 137/82  01/23/19 140/88  01/09/19 (!) 145/89  Pharmacist Clinical Goal(s): Over the next 180 days, patient will work with PharmD and providers to achieve BP goal <130/80 Current regimen:  Metoprolol succinate 50 mg once daily  Valsartan 160 mg once daily  Interventions: We reviewed home BP monitoring due to pt concern of possible over treatment - purchase XL cuff for upper arm - has been confirmed with [facility] that they do carry this size. Reviewed recommendations to increase water intake/improve hydration. Patient self care activities - Over the next 180 days, patient will: Check BP at least once every 1-2 weeks, document, and provide at future appointments Ensure daily salt intake < 2300 mg/day  Hyperlipidemia Lab Results  Component Value Date/Time   LDLCALC 66 01/09/2019 09:56 AM   LDLDIRECT 78.1 01/15/2013 07:47 AM  Pharmacist Clinical Goal(s): Over the next 180 days, patient will work with PharmD and providers to maintain LDL goal < 70 Current regimen:  Atorvastatin 10 mg daily Interventions: Continue current management Patient self care activities - Over the next 180 days, patient will: Continue current management  Coronary Artery Disease Pharmacist Clinical Goal(s) Over the next 180 days, patient will work with PharmD and providers to ensure medication safety.   Current regimen:  Clopidogrel 75 mg once daily Aspirin 81 mg once daily Interventions: Continue current management Consider recommendation for high intensity statin Patient self care activities - Over the next 180 days, patient will: Continue current management  Medication management Pharmacist Clinical Goal(s): Over the next 180 days, patient will work with PharmD and providers to achieve optimal medication adherence Current [facility] Interventions Comprehensive medication review performed. Continue current medication management strategy Patient self care activities - Over the next 180 days, patient will: Take medications as prescribed Report any questions or concerns to PharmD and/or provider(s) Initial goal documentation.     Patient Stated     Lose 25lbs      Patient Stated     Patient Stated     Lose weight      Patient Stated     Lose weight         This is a list of the screening recommended for you and due dates:  Health Maintenance  Topic Date Due   DTaP/Tdap/Td vaccine (1 - Tdap) Never done   COVID-19 Vaccine (4 - 2023-24 season) 01/29/2022   Medicare Annual Wellness Visit  06/29/2023   Colon Cancer Screening  10/23/2023   Pneumonia Vaccine  Completed   Flu Shot  Completed   Hepatitis C Screening: USPSTF Recommendation to screen - Ages 18-79 yo.  Completed   Zoster (Shingles) Vaccine  Completed   HPV Vaccine  Aged Out    Advanced directives: Please bring a copy of your health care power of attorney and living will to the office at your convenience.  Conditions/risks identified: lose weight  Next appointment: Follow up in one year for your annual wellness visit.   Preventive Care 18 Years and Older, Male  Preventive care refers to lifestyle choices and visits with your health care provider that can promote health and wellness. What does preventive care include? A yearly physical exam. This is also called an annual well check. Dental exams  once or twice a year. Routine eye exams. Ask your health care provider how often you should have your eyes checked. Personal lifestyle choices, including: Daily care of your teeth and gums. Regular physical activity. Eating a healthy diet. Avoiding tobacco and drug use. Limiting alcohol use. Practicing safe sex. Taking low doses of aspirin every day. Taking vitamin and mineral supplements as recommended by your health care provider. What happens during an annual well check? The services and screenings done by your health care provider during your annual well check will depend on your age, overall health, lifestyle risk factors, and family history of disease. Counseling  Your health care provider may ask you questions about your: Alcohol use. Tobacco use. Drug use. Emotional well-being. Home and relationship well-being. Sexual activity. Eating habits. History of falls. Memory and ability to understand (cognition). Work and work Statistician. Screening  You may have the following tests or measurements: Height, weight, and BMI. Blood pressure. Lipid and cholesterol levels. These may be checked every 5 years, or more frequently if you are over 10 years old. Skin check. Lung cancer screening. You may have this screening every year starting at age 68 if you have a 30-pack-year history of smoking and currently smoke or have quit within the past 15 years. Fecal occult blood test (FOBT) of the stool. You may have this test every year starting at age 47. Flexible sigmoidoscopy or colonoscopy. You may have a sigmoidoscopy every 5 years or a colonoscopy every 10 years starting at age 39. Prostate cancer screening. Recommendations will vary depending on your family history and other risks. Hepatitis C blood test. Hepatitis B blood test. Sexually transmitted disease (STD) testing. Diabetes screening. This is done by checking your blood sugar (glucose) after you have not eaten for a while  (fasting). You may have this done every 1-3 years. Abdominal aortic aneurysm (AAA) screening. You may need this if you are a current or former smoker. Osteoporosis. You may be screened starting at age 5 if you are at high risk. Talk with your health care provider about your test results, treatment options, and if necessary, the need for more tests. Vaccines  Your health care provider may recommend certain vaccines, such as: Influenza vaccine. This is recommended every year. Tetanus, diphtheria, and acellular pertussis (Tdap, Td) vaccine. You may need a Td booster every 10 years. Zoster vaccine. You may need this after age 13. Pneumococcal 13-valent conjugate (PCV13) vaccine. One dose is recommended after age 68. Pneumococcal polysaccharide (PPSV23) vaccine. One dose is recommended after age 70. Talk to your health care provider about which screenings and vaccines you need and how often you need them. This information is not intended to replace advice given to you by your health care provider. Make sure you discuss any questions you have with your health care provider. Document Released: 06/13/2015 Document Revised: 02/04/2016 Document Reviewed: 03/18/2015 Elsevier Interactive Patient Education  2017 [place] Prevention in the Home Falls can cause injuries. They can happen to people of all ages. There are many things you can do to make your home safe and to help prevent falls. What can I  do on the outside of my home? Regularly fix the edges of walkways and driveways and fix any cracks. Remove anything that might make you trip as you walk through a door, such as a raised step or threshold. Trim any bushes or trees on the path to your home. Use bright outdoor lighting. Clear any walking paths of anything that might make someone trip, such as rocks or tools. Regularly check to see if handrails are loose or broken. Make sure that both sides of any steps have handrails. Any raised  decks and porches should have guardrails on the edges. Have any leaves, snow, or ice cleared regularly. Use sand or salt on walking paths during winter. Clean up any spills in your garage right away. This includes oil or grease spills. What can I do in the bathroom? Use night lights. Install grab bars by the toilet and in the tub and shower. Do not use towel bars as grab bars. Use non-skid mats or decals in the tub or shower. If you need to sit down in the shower, use a plastic, non-slip stool. Keep the floor dry. Clean up any water that spills on the floor as soon as it happens. Remove soap buildup in the tub or shower regularly. Attach bath mats securely with double-sided non-slip rug tape. Do not have throw rugs and other things on the floor that can make you trip. What can I do in the bedroom? Use night lights. Make sure that you have a light by your bed that is easy to reach. Do not use any sheets or blankets that are too big for your bed. They should not hang down onto the floor. Have a firm chair that has side arms. You can use this for support while you get dressed. Do not have throw rugs and other things on the floor that can make you trip. What can I do in the kitchen? Clean up any spills right away. Avoid walking on wet floors. Keep items that you use a lot in easy-to-reach places. If you need to reach something above you, use a strong step stool that has a grab bar. Keep electrical cords out of the way. Do not use floor polish or wax that makes floors slippery. If you must use wax, use non-skid floor wax. Do not have throw rugs and other things on the floor that can make you trip. What can I do with my stairs? Do not leave any items on the stairs. Make sure that there are handrails on both sides of the stairs and use them. Fix handrails that are broken or loose. Make sure that handrails are as long as the stairways. Check any carpeting to make sure that it is firmly attached  to the stairs. Fix any carpet that is loose or worn. Avoid having throw rugs at the top or bottom of the stairs. If you do have throw rugs, attach them to the floor with carpet tape. Make sure that you have a light switch at the top of the stairs and the bottom of the stairs. If you do not have them, ask someone to add them for you. What else can I do to help prevent falls? Wear shoes that: Do not have high heels. Have rubber bottoms. Are comfortable and fit you well. Are closed at the toe. Do not wear sandals. If you use a stepladder: Make sure that it is fully opened. Do not climb a closed stepladder. Make sure that both sides of the stepladder  are locked into place. Ask someone to hold it for you, if possible. Clearly mark and make sure that you can see: Any grab bars or handrails. First and last steps. Where the edge of each step is. Use tools that help you move around (mobility aids) if they are needed. These include: Canes. Walkers. Scooters. Crutches. Turn on the lights when you go into a dark area. Replace any light bulbs as soon as they burn out. Set up your furniture so you have a clear path. Avoid moving your furniture around. If any of your floors are uneven, fix them. If there are any pets around you, be aware of where they are. Review your medicines with your doctor. Some medicines can make you feel dizzy. This can increase your chance of falling. Ask your doctor what other things that you can do to help prevent falls. This information is not intended to replace advice given to you by your health care provider. Make sure you discuss any questions you have with your health care provider. Document Released: 03/13/2009 Document Revised: 10/23/2015 Document Reviewed: 06/21/2014 Elsevier Interactive Patient Education  2017 [employer].

## 2022-11-04 DIAGNOSIS — L570 Actinic keratosis: Secondary | ICD-10-CM | POA: Diagnosis not present

## 2022-11-04 DIAGNOSIS — R233 Spontaneous ecchymoses: Secondary | ICD-10-CM | POA: Diagnosis not present

## 2022-11-04 DIAGNOSIS — C44329 Squamous cell carcinoma of skin of other parts of face: Secondary | ICD-10-CM | POA: Diagnosis not present

## 2023-01-13 ENCOUNTER — Encounter (INDEPENDENT_AMBULATORY_CARE_PROVIDER_SITE_OTHER): Payer: Self-pay

## 2023-02-22 ENCOUNTER — Ambulatory Visit (INDEPENDENT_AMBULATORY_CARE_PROVIDER_SITE_OTHER): Payer: PPO | Admitting: Family Medicine

## 2023-02-22 ENCOUNTER — Encounter: Payer: Self-pay | Admitting: Family Medicine

## 2023-02-22 ENCOUNTER — Encounter: Payer: PPO | Admitting: Family Medicine

## 2023-02-22 VITALS — BP 131/79 | HR 60 | Temp 97.7°F | Ht 72.0 in | Wt 309.4 lb

## 2023-02-22 DIAGNOSIS — Z0000 Encounter for general adult medical examination without abnormal findings: Secondary | ICD-10-CM | POA: Diagnosis not present

## 2023-02-22 DIAGNOSIS — I10 Essential (primary) hypertension: Secondary | ICD-10-CM | POA: Diagnosis not present

## 2023-02-22 DIAGNOSIS — E785 Hyperlipidemia, unspecified: Secondary | ICD-10-CM

## 2023-02-22 DIAGNOSIS — G4730 Sleep apnea, unspecified: Secondary | ICD-10-CM | POA: Diagnosis not present

## 2023-02-22 DIAGNOSIS — R739 Hyperglycemia, unspecified: Secondary | ICD-10-CM | POA: Diagnosis not present

## 2023-02-22 DIAGNOSIS — Z23 Encounter for immunization: Secondary | ICD-10-CM

## 2023-02-22 LAB — COMPREHENSIVE METABOLIC PANEL
ALT: 54 U/L — ABNORMAL HIGH (ref 0–53)
AST: 49 U/L — ABNORMAL HIGH (ref 0–37)
Albumin: 3.8 g/dL (ref 3.5–5.2)
Alkaline Phosphatase: 136 U/L — ABNORMAL HIGH (ref 39–117)
BUN: 16 mg/dL (ref 6–23)
CO2: 27 mEq/L (ref 19–32)
Calcium: 9 mg/dL (ref 8.4–10.5)
Chloride: 104 mEq/L (ref 96–112)
Creatinine, Ser: 0.95 mg/dL (ref 0.40–1.50)
GFR: 80.3 mL/min (ref >60.00)
Glucose, Bld: 109 mg/dL — ABNORMAL HIGH (ref 70–99)
Potassium: 4.4 mEq/L (ref 3.5–5.1)
Sodium: 139 mEq/L (ref 135–145)
Total Bilirubin: 1 mg/dL (ref 0.2–1.2)
Total Protein: 6.7 g/dL (ref 6.0–8.3)

## 2023-02-22 LAB — LIPID PANEL
Cholesterol: 126 mg/dL (ref 0–200)
HDL: 49 mg/dL (ref >39.00)
LDL Cholesterol: 57 mg/dL (ref 0–99)
NonHDL: 77.46
Total CHOL/HDL Ratio: 3
Triglycerides: 104 mg/dL (ref 0.0–149.0)
VLDL: 20.8 mg/dL (ref 0.0–40.0)

## 2023-02-22 LAB — CBC
HCT: 45.3 % (ref 39.0–52.0)
Hemoglobin: 15.1 g/dL (ref 13.0–17.0)
MCHC: 33.3 g/dL (ref 30.0–36.0)
MCV: 104.8 fl — ABNORMAL HIGH (ref 78.0–100.0)
Platelets: 129 10*3/uL — ABNORMAL LOW (ref 150.0–400.0)
RBC: 4.32 Mil/uL (ref 4.22–5.81)
RDW: 13.6 % (ref 11.5–15.5)
WBC: 3.7 10*3/uL — ABNORMAL LOW (ref 4.0–10.5)

## 2023-02-22 LAB — TSH: TSH: 2.24 u[IU]/mL (ref 0.35–5.50)

## 2023-02-22 LAB — HEMOGLOBIN A1C: Hgb A1c MFr Bld: 5.4 % (ref 4.6–6.5)

## 2023-02-22 LAB — PSA: PSA: 0.19 ng/mL (ref 0.10–4.00)

## 2023-02-22 NOTE — Assessment & Plan Note (Signed)
Check A1c 

## 2023-02-22 NOTE — Assessment & Plan Note (Signed)
Will refer for sleep study

## 2023-02-22 NOTE — Assessment & Plan Note (Signed)
Initially mildly elevated at 148/78. On recheck improved to 131/79. Continue regimen per cardiology with metoprolol succinate 50 mg daily and valsartan 160 mg daily.

## 2023-02-22 NOTE — Patient Instructions (Signed)
It was very nice to see you today!  We will give you your flu shot today.  We will check blood work.  We will refer you for a sleep study.  Please continue to work on diet and exercise.  We will see back in year for your next physical.  Come back sooner if needed.  Return in about 1 year (around 02/22/2024) for Annual Physical.   Take care, Dr Jimmey Ralph  PLEASE NOTE:  If you had any lab tests, please let us know if you have not heard back within a few days. You may see your results on mychart before we have a chance to review them but we will give you a call once they are reviewed by [place].   If we ordered any referrals today, please let us know if you have not heard from their office within the next week.   If you had any urgent prescriptions sent in today, please check with the pharmacy within an hour of our visit to make sure the prescription was transmitted appropriately.   Please try these tips to maintain a healthy lifestyle:  Eat at least 3 REAL meals and 1-2 snacks per day.  Aim for no more than 5 hours between eating.  If you eat breakfast, please do so within one hour of getting up.   Each meal should contain half fruits/vegetables, one quarter protein, and one quarter carbs (no bigger than a computer mouse)  Cut down on sweet beverages. This includes juice, soda, and sweet tea.   Drink at least 1 glass of water with each meal and aim for at least 8 glasses per day  Exercise at least 150 minutes every week.    Preventive Care 41 Years and Older, Male Preventive care refers to lifestyle choices and visits with your health care provider that can promote health and wellness. Preventive care visits are also called wellness exams. What can I expect for my preventive care visit? Counseling During your preventive care visit, your health care provider may ask about your: Medical history, including: Past medical problems. Family medical history. History of falls. Current  health, including: Emotional well-being. Home life and relationship well-being. Sexual activity. Memory and ability to understand (cognition). Lifestyle, including: Alcohol, nicotine or tobacco, and drug use. Access to firearms. Diet, exercise, and sleep habits. Work and work Astronomer. Sunscreen use. Safety issues such as seatbelt and bike helmet use. Physical exam Your health care provider will check your: Height and weight. These may be used to calculate your BMI (body mass index). BMI is a measurement that tells if you are at a healthy weight. Waist circumference. This measures the distance around your waistline. This measurement also tells if you are at a healthy weight and may help predict your risk of certain diseases, such as type 2 diabetes and high blood pressure. Heart rate and blood pressure. Body temperature. Skin for abnormal spots. What immunizations do I need?  Vaccines are usually given at various ages, according to a schedule. Your health care provider will recommend vaccines for you based on your age, medical history, and lifestyle or other factors, such as travel or where you work. What tests do I need? Screening Your health care provider may recommend screening tests for certain conditions. This may include: Lipid and cholesterol levels. Diabetes screening. This is done by checking your blood sugar (glucose) after you have not eaten for a while (fasting). Hepatitis C test. Hepatitis B test. HIV (human immunodeficiency virus) test. STI (sexually  transmitted infection) testing, if you are at risk. Lung cancer screening. Colorectal cancer screening. Prostate cancer screening. Abdominal aortic aneurysm (AAA) screening. You may need this if you are a current or former smoker. Talk with your health care provider about your test results, treatment options, and if necessary, the need for more tests. Follow these instructions at home: Eating and drinking  Eat a  diet that includes fresh fruits and vegetables, whole grains, lean protein, and low-fat dairy products. Limit your intake of foods with high amounts of sugar, saturated fats, and salt. Take vitamin and mineral supplements as recommended by your health care provider. Do not drink alcohol if your health care provider tells you not to drink. If you drink alcohol: Limit how much you have to 0-2 drinks a day. Know how much alcohol is in your drink. In the [place] one drink equals one 12 oz bottle of beer (355 mL), one 5 oz glass of wine (148 mL), or one 1 oz glass of hard liquor (44 mL). Lifestyle Brush your teeth every morning and night with fluoride toothpaste. Floss one time each day. Exercise for at least 30 minutes 5 or more days each week. Do not use any products that contain nicotine or tobacco. These products include cigarettes, chewing tobacco, and vaping devices, such as e-cigarettes. If you need help quitting, ask your health care provider. Do not use drugs. If you are sexually active, practice safe sex. Use a condom or other form of protection to prevent STIs. Take aspirin only as told by your health care provider. Make sure that you understand how much to take and what form to take. Work with your health care provider to find out whether it is safe and beneficial for you to take aspirin daily. Ask your health care provider if you need to take a cholesterol-lowering medicine (statin). Find healthy ways to manage stress, such as: Meditation, yoga, or listening to music. Journaling. Talking to a trusted person. Spending time with friends and family. Safety Always wear your seat belt while driving or riding in a vehicle. Do not drive: If you have been drinking alcohol. Do not ride with someone who has been drinking. When you are tired or distracted. While texting. If you have been using any mind-altering substances or drugs. Wear a helmet and other protective equipment during sports  activities. If you have firearms in your house, make sure you follow all gun safety procedures. Minimize exposure to UV radiation to reduce your risk of skin cancer. What's next? Visit your health care provider once a year for an annual wellness visit. Ask your health care provider how often you should have your eyes and teeth checked. Stay up to date on all vaccines. This information is not intended to replace advice given to you by your health care provider. Make sure you discuss any questions you have with your health care provider. Document Revised: 11/12/2020 Document Reviewed: 11/12/2020 Elsevier Patient Education  2024 [employer].

## 2023-02-22 NOTE — Assessment & Plan Note (Signed)
Check lipids.  He is on Lipitor 10 mg daily.

## 2023-02-22 NOTE — Progress Notes (Signed)
Chief Complaint:  Alfred [name] is a 72 y.o. male who presents today for his annual comprehensive physical exam.    Assessment/Plan:  Chronic Problems Addressed Today: Sleep-disordered breathing Will refer for sleep study.  Hyperlipidemia Check lipids.  He is on Lipitor 10 mg daily.  Hyperglycemia Check A1c.  Essential hypertension Initially mildly elevated at 148/78. On recheck improved to 131/79. Continue regimen per cardiology with metoprolol succinate 50 mg daily and valsartan 160 mg daily.   Preventative Healthcare: Check labs. Flu shot given today.  He can get tetanus at the pharmacy.  Up-to-date on other vaccines and screenings.  Patient Counseling(The following topics were reviewed and/or handout was given):  -Nutrition: Stressed importance of moderation in sodium/caffeine intake, saturated fat and cholesterol, caloric balance, sufficient intake of fresh fruits, vegetables, and fiber.  -Stressed the importance of regular exercise.   -Substance Abuse: Discussed cessation/primary prevention of tobacco, alcohol, or other drug use; driving or other dangerous activities under the influence; availability of treatment for abuse.   -Injury prevention: Discussed safety belts, safety helmets, smoke detector, smoking near bedding or upholstery.   -Sexuality: Discussed sexually transmitted diseases, partner selection, use of condoms, avoidance of unintended pregnancy and contraceptive alternatives.   -Dental health: Discussed importance of regular tooth brushing, flossing, and dental visits.  -Health maintenance and immunizations reviewed. Please refer to Health maintenance section.  Return to care in 1 year for next preventative visit.     Subjective:  HPI:  He has no acute complaints today.   HE has started noticing more daytime somnolence. He does note that he wakes up at night frequently to urinate. This is getting worse. He has not had any paroxysmal nocturnal dyspnea.  He  would like to have a sleep study performed.  Lifestyle Diet: None specific.  Exercise: None specific. Very active with work.      02/22/2023    7:46 AM  Depression screen PHQ 2/9  Decreased Interest 1  Down, Depressed, Hopeless 1  PHQ - 2 Score 2  Altered sleeping 1  Tired, decreased energy 1  Change in appetite 0  Feeling bad or failure about yourself  0  Trouble concentrating 0  Moving slowly or fidgety/restless 0  Suicidal thoughts 0  PHQ-9 Score 4  Difficult doing work/chores Not difficult at all    Health Maintenance Due  Topic Date Due   DTaP/Tdap/Td (1 - Tdap) Never done     ROS: Per HPI, otherwise a complete review of systems was negative.   PMH:  The following were reviewed and entered/updated in epic: Past Medical History:  Diagnosis Date   AAA (abdominal aortic aneurysm) (HCC) 08/2011   see Surgical history   Anginal pain (HCC)    Arthritis    "knees; not bad"   CAD (coronary artery disease)    DES to RCA and CFX; Dr. Excell Seltzer cardiologist   Depression ~ 2009   'treated for ~ 6 months"   Dyslipidemia    HTN (hypertension)    unspecified essential   Hx of adenomatous colonic polyps 2008   Hyperplasia of prostate without lower urinary tract symptoms (LUTS)    Left lumbar radiculopathy    Other and unspecified hyperlipidemia    Stroke [facility])    Patient Active Problem List   Diagnosis Date Noted   Cervical radiculopathy 02/16/2022   Hyperglycemia 01/27/2021   Sleep-disordered breathing 01/27/2021   Actinic keratosis 01/28/2020   CAD (coronary artery disease)    Essential hypertension 05/02/2009  History of AAA (abdominal aortic aneurysm) repair 11/05/2008   COLONIC POLYPS, ADENOMATOUS 03/28/2008   Hyperlipidemia 03/28/2008   LUMBAR RADICULOPATHY, LEFT 03/28/2008   Past Surgical History:  Procedure Laterality Date   ABDOMINAL AORTIC ANEURYSM REPAIR  09/06/2011   ABDOMINAL AORTIC ANEURYSM REPAIR  01/21/2012   Procedure: ANEURYSM ABDOMINAL AORTIC  REPAIR;  Surgeon: Sherren Kerns, MD;  Location: [facility] OR;  Service: Vascular;  Laterality: N/A;  Aortio- bi-iliac repair  using Cryolife descending thoracic aorta 14mm x10.5 cm.   APPENDECTOMY  1970   ARTERY REPAIR  09/06/2011   Procedure: ARTERY REPAIR;  Surgeon: Sherren Kerns, MD;  Location: [facility] OR;  Service: Vascular;  Laterality: Bilateral;   COLONOSCOPY W/ POLYPECTOMY  2008   adenomatous  & hyperplastic polyp;due 2013   CORONARY ANGIOPLASTY WITH STENT PLACEMENT  2002; 2005   "2 +1; total of 3"   CYSTOSCOPY  2005   varicose veins in bladder, Dr Retta Diones   TONSILLECTOMY AND ADENOIDECTOMY  1954   VASECTOMY      Family History  Problem Relation Age of Onset   Coronary artery disease Father        CABG in 16s   Diabetes Father    Colon polyps Father    Lung cancer Father        non-small cell carcinoma/bone CA   Hypertension Father    Coronary artery disease Maternal Grandfather    Coronary artery disease Paternal Grandfather    Other Mother        varicose veins   Anesthesia problems Neg Hx    Stroke Neg Hx    Colon cancer Neg Hx    Esophageal cancer Neg Hx    Rectal cancer Neg Hx    Stomach cancer Neg Hx     Medications- reviewed and updated Current Outpatient Medications  Medication Sig Dispense Refill   acetaminophen (TYLENOL) 500 MG tablet Take 500 mg by mouth every 6 (six) hours as needed for mild pain. PRN for pain/fever     aspirin 81 MG tablet Take 81 mg by mouth at bedtime.     atorvastatin (LIPITOR) 10 MG tablet Take 1 tablet (10 mg total) by mouth daily. 90 tablet 3   clopidogrel (PLAVIX) 75 MG tablet Take 1 tablet (75 mg total) by mouth daily. 90 tablet 3   metoprolol succinate (TOPROL-XL) 50 MG 24 hr tablet TAKE 1 TABLET BY MOUTH EVERY DAY WITH OR IMMEDIATELY FOLLOWING A MEAL 90 tablet 3   valsartan (DIOVAN) 160 MG tablet Take one (1) tablet by mouth (160 mg) daily 90 tablet 3   No current facility-administered medications for this visit.     Allergies-reviewed and updated No Known Allergies  Social History   Socioeconomic History   Marital status: Divorced    Spouse name: Not on file   Number of children: Not on file   Years of education: Not on file   Highest education level: Not on file  Occupational History   Occupation: Farming  Tobacco Use   Smoking status: Former    Current packs/day: 0.00    Average packs/day: 1 pack/day for 27.0 years (27.0 ttl pk-yrs)    Types: Cigarettes    Start date: 05/31/1966    Quit date: 05/31/1993    Years since quitting: 29.7   Smokeless tobacco: Current    Types: Snuff   Tobacco comments:    smoked 1968-1995, up to 1 ppd. Currently using Skoal 1 can / day  Vaping Use   Vaping  status: Never Used  Substance and Sexual Activity   Alcohol use: Yes    Alcohol/week: 21.0 standard drinks of alcohol    Types: 21 Shots of liquor per week    Comment: 2-3 drinks of Vodka a night, occasional beer   Drug use: No   Sexual activity: Yes  Other Topics Concern   Not on file  Social History Narrative   Not on file   Social Determinants of Health   Financial Resource Strain: Low Risk  (06/19/2021)   Overall Financial Resource Strain (CARDIA)    Difficulty of Paying Living Expenses: Not hard at all  Food Insecurity: No Food Insecurity (06/19/2021)   Hunger Vital Sign    Worried About Running Out of Food in the Last Year: Never true    Ran Out of Food in the Last Year: Never true  Transportation Needs: No Transportation Needs (06/19/2021)   PRAPARE - Administrator, Civil Service (Medical): No    Lack of Transportation (Non-Medical): No  Physical Activity: Inactive (06/19/2021)   Exercise Vital Sign    Days of Exercise per Week: 0 days    Minutes of Exercise per Session: 0 min  Stress: No Stress Concern Present (06/19/2021)   [employer] of Occupational Health - Occupational Stress Questionnaire    Feeling of Stress : Only a little  Social Connections: Unknown  (06/24/2022)   Social Connection and Isolation Panel [NHANES]    Frequency of Communication with Friends and Family: Not on file    Frequency of Social Gatherings with Friends and Family: Not on file    Attends Religious Services: Never    Database administrator or Organizations: Not on file    Attends Banker Meetings: Never    Marital Status: Not on file        Objective:  Physical Exam: BP 131/79   Pulse 60   Temp 97.7 F (36.5 C) (Temporal)   Ht 6' (1.829 m)   Wt (!) 309 lb 6.4 oz (140.3 kg)   SpO2 96%   BMI 41.96 kg/m   Body mass index is 41.96 kg/m. Wt Readings from Last 3 Encounters:  02/22/23 (!) 309 lb 6.4 oz (140.3 kg)  06/22/22 (!) 314 lb 12.8 oz (142.8 kg)  02/16/22 (!) 314 lb 6.4 oz (142.6 kg)   Gen: NAD, resting comfortably HEENT: TMs normal bilaterally. OP clear. No thyromegaly noted.  CV: RRR with no murmurs appreciated Pulm: NWOB, CTAB with no crackles, wheezes, or rhonchi GI: Normal bowel sounds present. Soft, Nontender, Nondistended. MSK: no edema, cyanosis, or clubbing noted Skin: warm, dry Neuro: CN2-12 grossly intact. Strength 5/5 in upper and lower extremities. Reflexes symmetric and intact bilaterally.  Psych: Normal affect and thought content     Alfred Labo M. Jimmey Ralph, MD 02/22/2023 8:41 AM

## 2023-02-24 NOTE — Progress Notes (Signed)
His labs are all stable compared to last year.  Liver numbers are slightly better than last year.  His blood sugar and cholesterol levels are at goal.  PSA is normal.  Do not need to make any changes to treatment plan based on results.  He can continue his current medications and continue to work on diet and exercise and we can recheck everything in a year or so.

## 2023-03-14 ENCOUNTER — Encounter: Payer: Self-pay | Admitting: Physician Assistant

## 2023-03-14 ENCOUNTER — Ambulatory Visit: Payer: PPO | Admitting: Physician Assistant

## 2023-03-14 VITALS — BP 140/70 | HR 63 | Temp 97.3°F | Ht 72.0 in | Wt 315.2 lb

## 2023-03-14 DIAGNOSIS — M79604 Pain in right leg: Secondary | ICD-10-CM | POA: Diagnosis not present

## 2023-03-14 NOTE — Patient Instructions (Addendum)
It was great to see you!  I discussed case with Dr Jimmey Ralph -- We will get you into Sports Medicine as soon as possible so they can determine next steps  Please keep leg elevated and consider compression stocking to keep the swelling down If we can keep the swelling down, then hopefully the pain will improve  If chest pain or shortness of breath let us know  A referral has been placed for you to see one of our fantastic providers at [facility]. Someone from their office will be in touch soon regarding scheduling your appointment.  Their location:  [place] [facility] at [facility]  [address] on the 1st floor Phone number [phone] Fax [phone].   This location is across the street from the entrance to [employer] and in the same complex as the [facility]  They will call you shortly to schedule appointment   Take care,  Jarold Motto PA-C
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
## 2023-03-15 ENCOUNTER — Other Ambulatory Visit: Payer: Self-pay

## 2023-03-15 ENCOUNTER — Encounter: Payer: Self-pay | Admitting: Family Medicine

## 2023-03-15 ENCOUNTER — Ambulatory Visit: Payer: PPO | Admitting: Family Medicine

## 2023-03-15 ENCOUNTER — Ambulatory Visit (INDEPENDENT_AMBULATORY_CARE_PROVIDER_SITE_OTHER): Payer: PPO

## 2023-03-15 VITALS — BP 122/72 | HR 74 | Ht 72.0 in | Wt 314.0 lb

## 2023-03-15 DIAGNOSIS — M25571 Pain in right ankle and joints of right foot: Secondary | ICD-10-CM | POA: Diagnosis not present

## 2023-03-15 DIAGNOSIS — M25471 Effusion, right ankle: Secondary | ICD-10-CM

## 2023-03-15 DIAGNOSIS — G8929 Other chronic pain: Secondary | ICD-10-CM

## 2023-03-15 DIAGNOSIS — M85871 Other specified disorders of bone density and structure, right ankle and foot: Secondary | ICD-10-CM | POA: Diagnosis not present

## 2023-03-15 LAB — URIC ACID: Uric Acid, Serum: 4.6 mg/dL (ref 4.0–7.8)
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
## 2023-03-15 NOTE — Patient Instructions (Addendum)
Thank you for coming in today.  You received an injection today. Seek immediate medical attention if the joint becomes red, extremely painful, or is oozing fluid.  Please get an Xray and lab today before you leave  I recommend an ankle compression sleeve.  Try Voltaren Gel

## 2023-03-16 NOTE — Progress Notes (Signed)
Uric acid level is normal.  Gout is extremely unlikely.

## 2023-04-06 NOTE — Progress Notes (Signed)
Right ankle x-ray looks okay to radiology.  No fractures.  No dislocation.  No severe arthritis.  Do have some chronic Achilles tendinitis changes visible.

## 2023-04-12 NOTE — Progress Notes (Unsigned)
   Rubin Payor, PhD, LAT, ATC acting as a scribe for [name], MD.  Alfred WYFFELS is a 72 y.o. male who presents to [facility] today for f/u R ankle pain. Pt was last seen by Dr. Denyse Amass on 03/15/23 and was given a R lateral ankle steroid injection, labs were checked, and he was advised to wear compression stockings.  Today, pt reports R ankle pain had been pretty painful, but seems to mostly resolved over the last 3-4 days. He had been helping out in [place], but has been resting more lately.  Dx testing: 03/15/23 R ankle XR & labs  Pertinent review of systems: No fevers or chills  Relevant historical information: Hypertension   Exam:  BP (!) 140/78   Pulse 61   Ht 6' (1.829 m)   Wt (!) 316 lb (143.3 kg)   SpO2 96%   BMI 42.86 kg/m  General: Well Developed, well nourished, and in no acute distress.   MSK: Right ankle minimal effusion.  Normal motion.    Lab and Radiology Results  EXAM: RIGHT ANKLE - COMPLETE 3+ VIEW   COMPARISON:  None Available.   FINDINGS: Osteopenia. No acute fracture or dislocation. Ankle mortise is preserved. Enthesopathic changes of the Achilles tendon. No area of erosion or osseous destruction. No unexpected radiopaque foreign body. Soft tissues are unremarkable.   IMPRESSION: 1. No acute fracture or dislocation.     Electronically Signed   By: Meda Klinefelter M.D.   On: 04/06/2023 07:35 I, [name], personally (independently) visualized and performed the interpretation of the images attached in this note.    Assessment and Plan: 72 y.o. male with right ankle pain and swelling.  X-ray was surprisingly normal.  I do think he probably does have some degenerative changes that it is not visible on x-ray that is constipating to his ankle pain and swelling.  He responded pretty well to a cortisone shot.  Recommend adding compression sleeve and continued activity.  Check back as needed.   Could repeat injection in 3 months which would be middle January.  If needed would recommend an MRI as next diagnostic step.    Discussed warning signs or symptoms. Please see discharge instructions. Patient expresses understanding.   The above documentation has been reviewed and is accurate and complete [name], M.D.

## 2023-04-14 ENCOUNTER — Ambulatory Visit: Payer: PPO | Admitting: Family Medicine

## 2023-04-14 VITALS — BP 140/78 | HR 61 | Ht 72.0 in | Wt 316.0 lb

## 2023-04-14 DIAGNOSIS — G8929 Other chronic pain: Secondary | ICD-10-CM

## 2023-04-14 DIAGNOSIS — M25571 Pain in right ankle and joints of right foot: Secondary | ICD-10-CM | POA: Diagnosis not present

## 2023-04-14 NOTE — Patient Instructions (Addendum)
Thank you for coming in today.   I recommend you obtained a compression sleeve to help with your joint problems. There are many options on the market however I recommend obtaining a full ankle Body Helix compression sleeve.  You can find information (including how to appropriate measure yourself for sizing) can be found at www.[email or web address].ch.  Many of these products are health savings account (HSA) eligible.   You can use the compression sleeve at any time throughout the day but is most important to use while being active as well as for 2 hours post-activity.   It is appropriate to ice following activity with the compression sleeve in place.   Recheck as needed.   Next step could be MRI.   I can repeat that injection every 3 months if needed

## 2023-04-15 ENCOUNTER — Ambulatory Visit: Payer: PPO | Admitting: Family Medicine

## 2023-06-30 ENCOUNTER — Encounter: Payer: Self-pay | Admitting: Cardiovascular Disease

## 2023-06-30 ENCOUNTER — Ambulatory Visit: Payer: PPO | Attending: Cardiovascular Disease | Admitting: Cardiovascular Disease

## 2023-06-30 VITALS — BP 126/84 | HR 66 | Ht 72.0 in | Wt 322.0 lb

## 2023-06-30 DIAGNOSIS — I2510 Atherosclerotic heart disease of native coronary artery without angina pectoris: Secondary | ICD-10-CM | POA: Diagnosis not present

## 2023-06-30 DIAGNOSIS — I10 Essential (primary) hypertension: Secondary | ICD-10-CM | POA: Diagnosis not present

## 2023-06-30 DIAGNOSIS — E782 Mixed hyperlipidemia: Secondary | ICD-10-CM | POA: Diagnosis not present

## 2023-06-30 DIAGNOSIS — Z9889 Other specified postprocedural states: Secondary | ICD-10-CM | POA: Diagnosis not present

## 2023-06-30 NOTE — Assessment & Plan Note (Signed)
Blood pressure control on metoprolol and valsartan.  No changes made today.  Most recent labs reviewed with creatinine 0.95

## 2023-06-30 NOTE — Assessment & Plan Note (Signed)
Patient followed by vascular surgery.  He reports no recurrent symptoms or problems.  He continues on aspirin, clopidogrel, beta-blocker, and a statin drug.

## 2023-06-30 NOTE — Patient Instructions (Signed)
Follow-Up: At [facility], you and your health needs are our priority.  As part of our continuing mission to provide you with exceptional heart care, we have created designated Provider Care Teams.  These Care Teams include your primary Cardiologist (physician) and Advanced Practice Providers (APPs -  Physician Assistants and Nurse Practitioners) who all work together to provide you with the care you need, when you need it.  Your next appointment:   1 year(s)  Provider:   Tonny Bollman, MD        1st Floor: - Lobby - Registration  - Pharmacy  - Lab - Cafe  2nd Floor: - PV Lab - Diagnostic Testing (echo, CT, nuclear med)  3rd Floor: - Vacant  4th Floor: - TCTS (cardiothoracic surgery) - AFib Clinic - Structural Heart Clinic - Vascular Surgery  - Vascular Ultrasound  5th Floor: - [facility]Care Cardiology (general and EP) - Clinical Pharmacy for coumadin, hypertension, lipid, weight-loss medications, and med management appointments    Valet parking services will be available as well.

## 2023-06-30 NOTE — Assessment & Plan Note (Signed)
Continues on DAPT.  No bleeding problems reported.

## 2023-06-30 NOTE — Progress Notes (Signed)
Cardiology Office Note:    Date:  06/30/2023   ID:  Alfred [name], DOB 1951-04-30, MRN [id number]  PCP:  Ardith Dark, MD   [facility] Providers Cardiologist:  Tonny Bollman, MD     Referring MD: Ardith Dark, MD   Chief Complaint  Patient presents with   Coronary Artery Disease    History of Present Illness:    Alfred [name] is a 73 y.o. male with a hx of:  Coronary artery disease  Hx of Cypher DES to RCA, PDA and LCx in 09/2003 Hypertension  Hyperlipidemia  Abdominal aortic aneurysm  S/p Repair in 2013 BPH Hx of CVA Hepatic steatosis (hx of elevated LFTs)  The patient is here alone today.  He reports no heart problems and specifically denies chest pain, chest pressure, or shortness of breath.  He said no heart palpitations, lightheadedness, or syncope.  He does complain of leg swelling.  He has been sedentary.  He notes that he has gained weight and he has not been following lifestyle recommendations.  He has been traveling with work and this lifestyle has not been conducive to eating healthy.  Current Medications: Current Meds  Medication Sig   acetaminophen (TYLENOL) 500 MG tablet Take 500 mg by mouth every 6 (six) hours as needed for mild pain. PRN for pain/fever   aspirin 81 MG tablet Take 81 mg by mouth at bedtime.   atorvastatin (LIPITOR) 10 MG tablet Take 1 tablet (10 mg total) by mouth daily.   clopidogrel (PLAVIX) 75 MG tablet Take 1 tablet (75 mg total) by mouth daily.   metoprolol succinate (TOPROL-XL) 50 MG 24 hr tablet TAKE 1 TABLET BY MOUTH EVERY DAY WITH OR IMMEDIATELY FOLLOWING A MEAL   valsartan (DIOVAN) 160 MG tablet Take one (1) tablet by mouth (160 mg) daily     Allergies:   Patient has no known allergies.   ROS:   Please see the history of present illness.    All other systems reviewed and are negative.  EKGs/Labs/Other Studies Reviewed:    The following studies were reviewed today: Cardiac Studies & Procedures      STRESS TESTS  MYOCARDIAL PERFUSION IMAGING 09/23/2021  Narrative   The study is normal. The study is low risk.   No ST deviation was noted.   Left ventricular function is normal. End diastolic cavity size is normal.   Prior study available for comparison from 05/09/2020.  Normal stress nuclear study with mild thinning of the inferior apical wall but no ischemia.  Gated ejection fraction 58% with normal wall motion.              EKG:   EKG Interpretation Date/Time:  Thursday June 30 2023 11:56:54 EST Ventricular Rate:  66 PR Interval:  182 QRS Duration:  102 QT Interval:  408 QTC Calculation: 427 R Axis:   -15  Text Interpretation: Normal sinus rhythm Minimal voltage criteria for LVH, may be normal variant ( R in aVL ) When compared with ECG of 21-Jan-2012 16:30, Nonspecific T wave abnormality, improved in Inferior leads Confirmed by Tonny Bollman [id number]) on 06/30/2023 12:16:52 PM    Recent Labs: 02/22/2023: ALT 54; BUN 16; Creatinine, Ser 0.95; Hemoglobin 15.1; Platelets 129.0; Potassium 4.4; Sodium 139; TSH 2.24  Recent Lipid Panel    Component Value Date/Time   CHOL 126 02/22/2023 0824   CHOL 134 01/09/2019 0956   TRIG 104.0 02/22/2023 0824   TRIG 110 05/26/2006 1118   HDL 49.00  02/22/2023 0824   HDL 50 01/09/2019 0956   CHOLHDL 3 02/22/2023 0824   VLDL 20.8 02/22/2023 0824   LDLCALC 57 02/22/2023 0824   LDLCALC 70 01/28/2020 0913   LDLDIRECT 78.1 01/15/2013 0747     Risk Assessment/Calculations:                Physical Exam:    VS:  BP 126/84   Pulse 66   Ht 6' (1.829 m)   Wt (!) 322 lb (146.1 kg)   SpO2 96%   BMI 43.67 kg/m     Wt Readings from Last 3 Encounters:  06/30/23 (!) 322 lb (146.1 kg)  04/14/23 (!) 316 lb (143.3 kg)  03/15/23 (!) 314 lb (142.4 kg)     GEN:  Well nourished, well developed obese male in no acute distress HEENT: Normal NECK: No JVD; No carotid bruits LYMPHATICS: No lymphadenopathy CARDIAC: RRR, no murmurs, rubs,  gallops RESPIRATORY:  Clear to auscultation without rales, wheezing or rhonchi  ABDOMEN: Soft, non-tender, non-distended MUSCULOSKELETAL:  No edema; No deformity  SKIN: Warm and dry NEUROLOGIC:  Alert and oriented x 3 PSYCHIATRIC:  Normal affect   Assessment & Plan Essential hypertension Blood pressure control on metoprolol and valsartan.  No changes made today.  Most recent labs reviewed with creatinine 0.95 History of AAA (abdominal aortic aneurysm) repair Patient followed by vascular surgery.  He reports no recurrent symptoms or problems.  He continues on aspirin, clopidogrel, beta-blocker, and a statin drug. Coronary artery disease involving native coronary artery of native heart without angina pectoris Continues on DAPT.  No bleeding problems reported. Mixed hyperlipidemia Treated with atorvastatin 10 mg daily.  Last lipids with an LDL cholesterol of 57.  We talked about the importance of lifestyle modification, weight loss, and initiation of regular exercise.            Medication Adjustments/Labs and Tests Ordered: Current medicines are reviewed at length with the patient today.  Concerns regarding medicines are outlined above.  Orders Placed This Encounter  Procedures   EKG 12-Lead   No orders of the defined types were placed in this encounter.   Patient Instructions  Follow-Up: At [facility], you and your health needs are our priority.  As part of our continuing mission to provide you with exceptional heart care, we have created designated Provider Care Teams.  These Care Teams include your primary Cardiologist (physician) and Advanced Practice Providers (APPs -  Physician Assistants and Nurse Practitioners) who all work together to provide you with the care you need, when you need it.  Your next appointment:   1 year(s)  Provider:   Tonny Bollman, MD       1st Floor: - Lobby - Registration  - Pharmacy  - Lab - Cafe  2nd Floor: - PV Lab -  Diagnostic Testing (echo, CT, nuclear med)  3rd Floor: - Vacant  4th Floor: - TCTS (cardiothoracic surgery) - AFib Clinic - Structural Heart Clinic - Vascular Surgery  - Vascular Ultrasound  5th Floor: - HeartCare Cardiology (general and EP) - Clinical Pharmacy for coumadin, hypertension, lipid, weight-loss medications, and med management appointments    Valet parking services will be available as well.      Signed, Tonny Bollman, MD  06/30/2023 1:45 PM     [facility]

## 2023-06-30 NOTE — Assessment & Plan Note (Signed)
Treated with atorvastatin 10 mg daily.  Last lipids with an LDL cholesterol of 57.  We talked about the importance of lifestyle modification, weight loss, and initiation of regular exercise.

## 2023-07-01 ENCOUNTER — Other Ambulatory Visit: Payer: Self-pay | Admitting: Cardiovascular Disease

## 2023-07-01 DIAGNOSIS — E782 Mixed hyperlipidemia: Secondary | ICD-10-CM

## 2023-07-01 DIAGNOSIS — I2510 Atherosclerotic heart disease of native coronary artery without angina pectoris: Secondary | ICD-10-CM

## 2023-07-01 DIAGNOSIS — I10 Essential (primary) hypertension: Secondary | ICD-10-CM

## 2023-07-01 DIAGNOSIS — I7143 Infrarenal abdominal aortic aneurysm, without rupture: Secondary | ICD-10-CM

## 2023-07-05 ENCOUNTER — Ambulatory Visit (INDEPENDENT_AMBULATORY_CARE_PROVIDER_SITE_OTHER): Payer: PPO

## 2023-07-05 VITALS — Wt 322.0 lb

## 2023-07-05 DIAGNOSIS — Z0000 Encounter for general adult medical examination without abnormal findings: Secondary | ICD-10-CM | POA: Diagnosis not present

## 2023-07-05 NOTE — Patient Instructions (Signed)
 [name] , Thank you for taking time to come for your Medicare Wellness Visit. I appreciate your ongoing commitment to your health goals. Please review the following plan we discussed and let me know if I can assist you in the future.   Referrals/Orders/Follow-Ups/Clinician Recommendations: Aim for 30 minutes of exercise or brisk walking, 6-8 glasses of water, and 5 servings of fruits and vegetables each day. Continue to work on losing weight   This is a list of the screening recommended for you and due dates:  Health Maintenance  Topic Date Due   DTaP/Tdap/Td vaccine (1 - Tdap) Never done   COVID-19 Vaccine (4 - 2024-25 season) 01/30/2023   Medicare Annual Wellness Visit  06/29/2023   Colon Cancer Screening  10/23/2023   Pneumonia Vaccine  Completed   Flu Shot  Completed   Hepatitis C Screening  Completed   Zoster (Shingles) Vaccine  Completed   HPV Vaccine  Aged Out    Advanced directives: (Copy Requested) Please bring a copy of your health care power of attorney and living will to the office to be added to your chart at your convenience.  Next Medicare Annual Wellness Visit scheduled for next year: Yes

## 2023-07-05 NOTE — Progress Notes (Signed)
 Subjective:   Alfred [name] is a 73 y.o. male who presents for Medicare Annual/Subsequent preventive examination.  Visit Complete: Virtual I connected with  Alfred [name] on 07/05/23 by a audio enabled telemedicine application and verified that I am speaking with the correct person using two identifiers.  Patient Location: Home  Provider Location: Office/Clinic  I discussed the limitations of evaluation and management by telemedicine. The patient expressed understanding and agreed to proceed.  Vital Signs: Because this visit was a virtual/telehealth visit, some criteria may be missing or patient reported. Any vitals not documented were not able to be obtained and vitals that have been documented are patient reported.   Cardiac Risk Factors include: advanced age (>50men, >79 women);dyslipidemia;male gender;hypertension;obesity (BMI >30kg/m2)     Objective:    Today's Vitals   07/05/23 0756  Weight: (!) 322 lb (146.1 kg)   Body mass index is 43.67 kg/m.     07/05/2023    8:01 AM 06/28/2022    8:08 AM 06/19/2021    8:12 AM 06/12/2020    8:15 AM 04/30/2019   12:13 PM 01/22/2016   12:16 PM 01/09/2015    9:18 AM  Advanced Directives  Does Patient Have a Medical Advance Directive? Yes Yes Yes Yes Yes Yes Yes  Type of Estate Agent of [place];Living will Healthcare Power of [place];Living will Healthcare Power of Attorney Living will Living will;Healthcare Power of [employer] Power of [place];Living will Healthcare Power of [place];Living will  Does patient want to make changes to medical advance directive?     No - Patient declined  No - Patient declined  Copy of Healthcare Power of Attorney in Chart? No - copy requested No - copy requested No - copy requested  No - copy requested      Current Medications (verified) Outpatient Encounter Medications as of 07/05/2023  Medication Sig   acetaminophen  (TYLENOL ) 500 MG tablet Take 500 mg by mouth every 6  (six) hours as needed for mild pain. PRN for pain/fever   aspirin  81 MG tablet Take 81 mg by mouth at bedtime.   atorvastatin  (LIPITOR) 10 MG tablet TAKE 1 TABLET BY MOUTH EVERY DAY   clopidogrel  (PLAVIX ) 75 MG tablet TAKE 1 TABLET BY MOUTH EVERY DAY   metoprolol  succinate (TOPROL -XL) 50 MG 24 hr tablet TAKE 1 TABLET BY MOUTH EVERY DAY WITH OR IMMEDIATELY FOLLOWING A MEAL   valsartan  (DIOVAN ) 160 MG tablet TAKE ONE (1) TABLET BY MOUTH (160 MG) DAILY   No facility-administered encounter medications on file as of 07/05/2023.    Allergies (verified) Patient has no known allergies.   History: Past Medical History:  Diagnosis Date   AAA (abdominal aortic aneurysm) (HCC) 08/2011   see Surgical history   Anginal pain (HCC)    Arthritis    knees; not bad   CAD (coronary artery disease)    DES to RCA and CFX; Dr. Wonda cardiologist   Depression ~ 2009   'treated for ~ 6 months   Dyslipidemia    HTN (hypertension)    unspecified essential   Hx of adenomatous colonic polyps 2008   Hyperplasia of prostate without lower urinary tract symptoms (LUTS)    Left lumbar radiculopathy    Other and unspecified hyperlipidemia    Stroke [facility])    Past Surgical History:  Procedure Laterality Date   ABDOMINAL AORTIC ANEURYSM REPAIR  09/06/2011   ABDOMINAL AORTIC ANEURYSM REPAIR  01/21/2012   Procedure: ANEURYSM ABDOMINAL AORTIC REPAIR;  Surgeon:  Carlin FORBES Haddock, MD;  Location: [facility] OR;  Service: Vascular;  Laterality: N/A;  Aortio- bi-iliac repair  using Cryolife descending thoracic aorta 14mm x10.5 cm.   APPENDECTOMY  1970   ARTERY REPAIR  09/06/2011   Procedure: ARTERY REPAIR;  Surgeon: Carlin FORBES Haddock, MD;  Location: [facility] OR;  Service: Vascular;  Laterality: Bilateral;   COLONOSCOPY W/ POLYPECTOMY  2008   adenomatous  & hyperplastic polyp;due 2013   CORONARY ANGIOPLASTY WITH STENT PLACEMENT  2002; 2005   2 +1; total of 3   CYSTOSCOPY  2005   varicose veins in bladder, Dr matilda    TONSILLECTOMY AND ADENOIDECTOMY  1954   VASECTOMY     Family History  Problem Relation Age of Onset   Coronary artery disease Father        CABG in 40s   Diabetes Father    Colon polyps Father    Lung cancer Father        non-small cell carcinoma/bone CA   Hypertension Father    Coronary artery disease Maternal Grandfather    Coronary artery disease Paternal Grandfather    Other Mother        varicose veins   Anesthesia problems Neg Hx    Stroke Neg Hx    Colon cancer Neg Hx    Esophageal cancer Neg Hx    Rectal cancer Neg Hx    Stomach cancer Neg Hx    Social History   Socioeconomic History   Marital status: Divorced    Spouse name: Not on file   Number of children: Not on file   Years of education: Not on file   Highest education level: Not on file  Occupational History   Occupation: Farming  Tobacco Use   Smoking status: Former    Current packs/day: 0.00    Average packs/day: 1 pack/day for 27.0 years (27.0 ttl pk-yrs)    Types: Cigarettes    Start date: 05/31/1966    Quit date: 05/31/1993    Years since quitting: 30.1   Smokeless tobacco: Current    Types: Snuff   Tobacco comments:    smoked 1968-1995, up to 1 ppd. Currently using Skoal 1 can / day  Vaping Use   Vaping status: Never Used  Substance and Sexual Activity   Alcohol use: Yes    Alcohol/week: 21.0 standard drinks of alcohol    Types: 21 Shots of liquor per week    Comment: 2-3 drinks of Vodka a night, occasional beer   Drug use: No   Sexual activity: Yes  Other Topics Concern   Not on file  Social History Narrative   Not on file   Social Drivers of Health   Financial Resource Strain: Low Risk  (07/05/2023)   Overall Financial Resource Strain (CARDIA)    Difficulty of Paying Living Expenses: Not hard at all  Food Insecurity: No Food Insecurity (07/05/2023)   Hunger Vital Sign    Worried About Running Out of Food in the Last Year: Never true    Ran Out of Food in the Last Year: Never true   Transportation Needs: No Transportation Needs (07/05/2023)   PRAPARE - Administrator, Civil Service (Medical): No    Lack of Transportation (Non-Medical): No  Physical Activity: Inactive (07/05/2023)   Exercise Vital Sign    Days of Exercise per Week: 0 days    Minutes of Exercise per Session: 0 min  Stress: No Stress Concern Present (07/05/2023)   [employer]  of Occupational Health - Occupational Stress Questionnaire    Feeling of Stress : Not at all  Social Connections: Moderately Isolated (07/05/2023)   Social Connection and Isolation Panel [NHANES]    Frequency of Communication with Friends and Family: More than three times a week    Frequency of Social Gatherings with Friends and Family: More than three times a week    Attends Religious Services: 1 to 4 times per year    Active Member of [employer] or Organizations: No    Attends Banker Meetings: Never    Marital Status: Divorced    Tobacco Counseling Ready to quit: Not Answered Counseling given: Not Answered Tobacco comments: smoked 1968-1995, up to 1 ppd. Currently using Skoal 1 can / day   Clinical Intake:  Pre-visit preparation completed: Yes  Pain : No/denies pain     BMI - recorded: 43.67 Nutritional Status: BMI > 30  Obese Diabetes: No  How often do you need to have someone help you when you read instructions, pamphlets, or other written materials from your doctor or pharmacy?: 1 - Never  Interpreter Needed?: No  Information entered by :: Ellouise Haws, LPN   Activities of Daily Living    07/05/2023    7:58 AM  In your present state of health, do you have any difficulty performing the following activities:  Hearing? 0  Vision? 0  Difficulty concentrating or making decisions? 0  Walking or climbing stairs? 0  Dressing or bathing? 0  Doing errands, shopping? 0  Preparing Food and eating ? N  Using the Toilet? N  In the past six months, have you accidently leaked urine? N  Do  you have problems with loss of bowel control? N  Managing your Medications? N  Managing your Finances? N  Housekeeping or managing your Housekeeping? N    Patient Care Team: Kennyth Worth HERO, MD as PCP - General (Family Medicine) Wonda Sharper, MD as PCP - Cardiology (Cardiology) Pandora Cadet, [facility] (Inactive) as Pharmacist (Pharmacist)  Indicate any recent Medical Services you may have received from other than [facility] providers in the past year (date may be approximate).     Assessment:   This is a routine wellness examination for [place].  Hearing/Vision screen Hearing Screening - Comments:: Pt denies any hearing issues  Vision Screening - Comments:: Pt follows up with Dr Bernardino Shoulder for annual eye exams    Goals Addressed             This Visit's Progress    Patient Stated       Continue to work on losing weight        Depression Screen    07/05/2023    8:03 AM 02/22/2023    7:46 AM 06/28/2022    8:07 AM 02/16/2022    7:54 AM 06/19/2021    8:10 AM 01/27/2021   11:15 AM 06/12/2020    8:11 AM  PHQ 2/9 Scores  PHQ - 2 Score 0 2 0 0 0 0 0  PHQ- 9 Score  4         Fall Risk    07/05/2023    8:05 AM 02/22/2023    7:47 AM 06/24/2022    4:23 PM 02/16/2022    7:55 AM 06/19/2021    8:14 AM  Fall Risk   Falls in the past year? 1 0 0 0 0  Number falls in past yr: 1 0 0 0 0  Injury with Fall? 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  No    Cognitive Function:        07/05/2023    8:07 AM 06/28/2022    8:10 AM 06/19/2021    8:19 AM 06/12/2020    8:21 AM  6CIT Screen  What Year? 0 points 0 points 0 points 0 points  What month? 0 points 0 points 0 points 0 points  What time? 0 points 0 points 0 points   Count back from 20 0 points 0 points 0 points 0 points  Months in reverse 0 points 0 points 0 points 0 points  Repeat phrase 0 points 0 points 0 points 0 points  Total Score 0 points 0 points 0 points     Immunizations Immunization History  Administered Date(s) Administered   Fluad Quad(high Dose 65+) 01/25/2019, 02/16/2022   Fluad Trivalent(High Dose 65+) 02/22/2023   PFIZER(Purple Top)SARS-COV-2 Vaccination 08/02/2019, 09/04/2019, 05/29/2020   Pneumococcal Conjugate-13 01/25/2018   Pneumococcal Polysaccharide-23 01/27/2021   Zoster Recombinant(Shingrix) 06/08/2020, 10/11/2020    TDAP status: Due, Education has been provided regarding the importance of this vaccine. Advised may receive this vaccine at local pharmacy or Health Dept. Aware to provide a copy of the vaccination record if obtained from local pharmacy or Health Dept. Verbalized acceptance and understanding.  Flu Vaccine status: Up to date  Pneumococcal vaccine status: Up to date  Covid-19 vaccine status: Information provided on how to obtain vaccines.   Qualifies for Shingles Vaccine? Yes   Zostavax completed Yes   Shingrix Completed?: Yes  Screening Tests Health Maintenance  Topic Date Due   DTaP/Tdap/Td (1 - Tdap) Never done   COVID-19 Vaccine (4 - 2024-25 season) 01/30/2023   Colonoscopy  10/23/2023   Medicare Annual Wellness (AWV)  07/04/2024   Pneumonia Vaccine 61+ Years old  Completed   INFLUENZA VACCINE  Completed   Hepatitis C Screening   Completed   Zoster Vaccines- Shingrix  Completed   HPV VACCINES  Aged Out    Health Maintenance  Health Maintenance Due  Topic Date Due   DTaP/Tdap/Td (1 - Tdap) Never done   COVID-19 Vaccine (4 - 2024-25 season) 01/30/2023   Colonoscopy  10/23/2023    Colorectal cancer screening: Referral to GI placed 07/05/23. Pt aware the office will call re: appt.   Additional Screening:  Hepatitis C Screening: Completed 02/16/22  Vision Screening: Recommended annual ophthalmology exams for early detection of glaucoma and other disorders of the eye. Is the patient up to date with their annual eye exam?  Yes  Who is the provider or what is the name of the office in  which the patient attends annual eye exams? Dr Bernardino Shoulder  If pt is not established with a provider, would they like to be referred to a provider to establish care? No .   Dental Screening: Recommended annual dental exams for proper oral hygiene   Community Resource Referral / Chronic Care Management: CRR required this visit?  No   CCM required this visit?  No     Plan:     I have personally reviewed and noted the following in the patient's chart:   Medical and social history Use of alcohol, tobacco or illicit drugs  Current medications and supplements including opioid prescriptions. Patient is not currently taking opioid prescriptions. Functional ability and status Nutritional status Physical activity Advanced directives List of other physicians Hospitalizations, surgeries, and ER visits in previous 12 months Vitals Screenings to include cognitive, depression, and falls Referrals and appointments  In addition, I have reviewed and discussed with patient certain preventive protocols, quality metrics, and best practice recommendations. A written personalized care plan for preventive services as well as general preventive health recommendations were provided to patient.     Ellouise VEAR Haws, LPN   12/02/7972   After  Visit Summary: (MyChart) Due to this being a telephonic visit, the after visit summary with patients personalized plan was offered to patient via MyChart   Nurse Notes: none

## 2023-07-18 DIAGNOSIS — R0981 Nasal congestion: Secondary | ICD-10-CM | POA: Diagnosis not present

## 2023-07-18 DIAGNOSIS — R059 Cough, unspecified: Secondary | ICD-10-CM | POA: Diagnosis not present

## 2023-10-18 ENCOUNTER — Encounter: Payer: Self-pay | Admitting: Family Medicine

## 2023-10-18 NOTE — Telephone Encounter (Signed)
 Ok to place referrals if needed. Please give him phone numbers to the respective offices as well.

## 2023-10-18 NOTE — Telephone Encounter (Signed)
**Note De-identified  [place] Obfuscation** Please advise 

## 2023-10-26 ENCOUNTER — Other Ambulatory Visit: Payer: Self-pay | Admitting: *Deleted

## 2023-10-26 DIAGNOSIS — G4730 Sleep apnea, unspecified: Secondary | ICD-10-CM

## 2023-10-26 DIAGNOSIS — Z1211 Encounter for screening for malignant neoplasm of colon: Secondary | ICD-10-CM

## 2023-11-24 ENCOUNTER — Encounter: Payer: Self-pay | Admitting: Physician Assistant

## 2023-12-12 ENCOUNTER — Telehealth: Payer: Self-pay | Admitting: Cardiovascular Disease

## 2023-12-12 DIAGNOSIS — I2510 Atherosclerotic heart disease of native coronary artery without angina pectoris: Secondary | ICD-10-CM

## 2023-12-12 NOTE — Telephone Encounter (Signed)
 Please review and advise.

## 2023-12-12 NOTE — Telephone Encounter (Signed)
 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 They no longer need a stress test. 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 He says he will also need an updated EKG.

## 2023-12-16 NOTE — Telephone Encounter (Signed)
 Patient stated he would like to have a lexiscan  myoview  done as soon as possible. That DOT is requiring a stress test, but he does not think he can get on treadmill and get his heart rate up.

## 2023-12-16 NOTE — Telephone Encounter (Signed)
 Patient is following up, per patient schedule message:  I am in need of a stress test by 01/04/24..Or I will lose my DOT health card.

## 2023-12-19 NOTE — Telephone Encounter (Signed)
 OK to order Lexiscan  myoview  stress test for evaluation of CAD and provide clearance for DOT. thanks

## 2023-12-21 ENCOUNTER — Telehealth (HOSPITAL_COMMUNITY): Payer: Self-pay | Admitting: Radiology

## 2023-12-21 NOTE — Telephone Encounter (Signed)
 Patient given detailed instructions per Myocardial Perfusion Study Information Sheet for the test on 7/28 at 7:30. Patient notified to arrive 15 minutes early and that it is imperative to arrive on time for appointment to keep from having the test rescheduled.  If you need to cancel or reschedule your appointment, please call the office within 24 hours of your appointment. . Patient verbalized understanding.EHK

## 2023-12-23 ENCOUNTER — Other Ambulatory Visit: Payer: Self-pay | Admitting: Cardiovascular Disease

## 2023-12-23 DIAGNOSIS — I2510 Atherosclerotic heart disease of native coronary artery without angina pectoris: Secondary | ICD-10-CM

## 2023-12-26 ENCOUNTER — Ambulatory Visit (HOSPITAL_COMMUNITY)
Admission: RE | Admit: 2023-12-26 | Discharge: 2023-12-26 | Disposition: A | Discharge status: Home / Self Care | Source: Ambulatory Visit | Attending: Cardiology | Admitting: Cardiology

## 2023-12-26 DIAGNOSIS — I2510 Atherosclerotic heart disease of native coronary artery without angina pectoris: Secondary | ICD-10-CM | POA: Insufficient documentation

## 2023-12-26 LAB — MYOCARDIAL PERFUSION IMAGING
Base ST Depression (mm): 0 mm
LV dias vol: 130 mL (ref 62–150)
LV sys vol: 47 mL (ref 4.2–5.8)
Nuc Stress EF: 64 %
Peak HR: 75 {beats}/min
Rest HR: 57 {beats}/min
Rest Nuclear Isotope Dose: 12 mCi
SDS: 3
SRS: 17
SSS: 8
ST Depression (mm): 0 mm
Stress Nuclear Isotope Dose: 32.4 mCi
TID: 1.14

## 2023-12-26 MED ORDER — TECHNETIUM TC 99M TETROFOSMIN IV KIT
12.0000 | PACK | Freq: Once | INTRAVENOUS | Status: AC | PRN
  Administered 2023-12-26: 12 via INTRAVENOUS

## 2023-12-26 MED ORDER — REGADENOSON 0.4 MG/5ML IV SOLN
0.4000 mg | Freq: Once | INTRAVENOUS | Status: AC
  Administered 2023-12-26: 0.4 mg via INTRAVENOUS

## 2023-12-26 MED ORDER — TECHNETIUM TC 99M TETROFOSMIN IV KIT
32.4000 | PACK | Freq: Once | INTRAVENOUS | Status: AC | PRN
  Administered 2023-12-26: 32.4 via INTRAVENOUS

## 2023-12-26 MED ORDER — REGADENOSON 0.4 MG/5ML IV SOLN
INTRAVENOUS | Status: AC
  Filled 2023-12-26: qty 5

## 2023-12-27 ENCOUNTER — Ambulatory Visit: Payer: Self-pay | Admitting: Cardiovascular Disease

## 2023-12-27 NOTE — Telephone Encounter (Signed)
 Patient needs a letter for DOT and have it sign and emailed to address below-  [email or web address]  Will send message to Dr. Wonda for okay to write letter and will have him sign tomorrow when he is in the office.

## 2024-01-09 NOTE — Progress Notes (Signed)
 01/10/2024 Alfred [name] [id number] 1950/06/17  Referring provider: Kennyth Worth HERO, MD Primary GI doctor: Dr. San (Dr. Aneita)  ASSESSMENT AND PLAN:  Personal history of adenomatous polyps  history of numerous greater than 10 adenomas 2019 10/22/2020 colonoscopy with Dr. Aneita adequate prep 6 TA polyps 5 to 9 mm descending transverse ascending cecum mild diverticulosis left colon, internal hemorrhoids, recall 3 years No changes in bowel habits, no blood in stool, no family history of colon cancer, will schedule for colonoscopy We have discussed the risks of bleeding, infection, perforation, medication reactions, and remote risk of death associated with colonoscopy. All questions were answered and the patient acknowledges these risk and wishes to proceed.  CAD status post DES with history of CVA/AAA repair 12/26/2023 Myoview  stress test normal ejection fraction low risk ischemic study On clopidogrel  Hold Plavix  for 5 days before procedure will instruct when and how to resume after procedure.  Patient understands that there is a low but real risk of cardiovascular event such as heart attack, stroke, or embolism /  thrombosis, or ischemia while off Plavix .  The patient consents to proceed.  Will communicate by phone or EMR with patient's prescribing provider to confirm that holding Plavix  is reasonable in this case.   GERD On medications, still with some GERD Will plan on Egd with colonoscopy to evaluate  Elevated LFTs with thrombocytopenia and macrocytosis Will have 1-2 drinks of vodka/water with lemon nightly for 5-6 years Morbidly obese, no family history of liver issues 03/01/2022 RUQ US  shows hepatic steatosis normal gallbladder Suspected metabolic dysfunction associated seatohepatitis (MASH, formerly NASH)  by history and ultrasound, some fear of more advanced fibrosis/cirrhosis with portal hypertension with thrombocytopenia Must exclude other chronic causes of  hepatocellular inflammation that can mimic fatty liver on ultrasound    Latest Ref Rng & Units 02/22/2023    8:24 AM 06/22/2022   10:27 AM 02/25/2022    9:01 AM  Hepatic Function  Total Protein 6.0 - 8.3 g/dL 6.7  6.8  7.0   Albumin  3.5 - 5.2 g/dL 3.8  4.1  3.8   AST 0 - 37 U/L 49  58  71   ALT 0 - 53 U/L 54  74  79   Alk Phosphatase 39 - 117 U/L 136  134  120   Total Bilirubin 0.2 - 1.2 mg/dL 1.0  0.8  0.7    Platelets 129.0  FIB 4 = 3.72 advanced fibrosis - Labs to include: hepatitis panel, iron, ferritin, TIBC,  IgG, ANA, Antismooth muscle antibody, AMA, celiac - will get INR to evaluate function -will get US  elastography, consider fibrosure - will schedule EGD with colonoscopy - need LFTs and CBC monitored every 6 months, - revaluation with imaging every 2-3 years.  -Continue to work on risk factor modification including diet exercise and control of risk factors including blood sugars. - Counseled on ETOH cessation.   Morbid obesity  Body mass index is 42.86 kg/m.  -Patient has been advised to make an attempt to improve diet and exercise patterns to aid in weight loss. -Recommended diet heavy in fruits and veggies and low in animal meats, cheeses, and dairy products, appropriate calorie intake   Patient Care Team: Kennyth Worth HERO, MD as PCP - General (Family Medicine) Wonda Sharper, MD as PCP - Cardiology (Cardiology) Pandora Cadet, [facility] as Pharmacist (Pharmacist)  HISTORY OF PRESENT ILLNESS: 73 y.o. male with a past medical history listed below presents for evaluation of colonoscopy.   Last  seen in the office 09/2020 by Dr. Aneita for screening colonoscopy on Plavix .  Discussed the use of AI scribe software for clinical note transcription with the patient, who gave verbal consent to proceed.  History of Present Illness   Alfred [name] is a 73 year old male who presents for a follow-up on his liver function and colonoscopy scheduling.  He has a history of tubular  adenomatous polyps, with six polyps found during his last colonoscopy in 2022. He also has diverticulosis and mild hemorrhoids. No blood in the stool or changes in bowel habits, although he notes occasional mild hemorrhoid symptoms. There is no family history of colon cancer, but his father died of non-small cell carcinoma.  He has elevated liver function tests and low platelets, with a 2023 ultrasound showing fatty liver. No symptoms such as jaundice or dark stools, although he mentions occasional mild stomach discomfort and reports some abdominal swelling related to his umbilical hernia. He consumes one to two drinks of vodka nightly, a habit maintained for five to six years since his children left home. There is no family history of liver issues.  He mentions a history of an umbilical hernia, which is not painful but can be uncomfortable when sitting for long periods. It is described as easily retractable and non-tender.  He has a history of taking medications for years, which he believes may have affected his enzymes, but he does not specify the medications. He denies frequent heartburn or reflux, only experiencing occasional mild stomach aches.      He  reports that he quit smoking about 30 years ago. His smoking use included cigarettes. He started smoking about 57 years ago. He has a 27 pack-year smoking history. His smokeless tobacco use includes snuff. He reports current alcohol use of about 21.0 standard drinks of alcohol per week. He reports that he does not use drugs.  RELEVANT GI HISTORY, IMAGING AND LABS: Results   LABS Platelets (PLT): Decreased Fibrosis-4 (Fib-4) Index: Significantly elevated Mean Corpuscular Volume (MCV): Elevated  RADIOLOGY Abdominal Ultrasound: Hepatic steatosis (2023)  DIAGNOSTIC Stress Test: Low risk (12/26/2023) Colonoscopy: Six tubular adenomatous polyps, diverticulosis, hemorrhoids (2022)      CBC    Component Value Date/Time   WBC 3.7 (L)  02/22/2023 0824   RBC 4.32 02/22/2023 0824   HGB 15.1 02/22/2023 0824   HGB 15.7 09/10/2016 0813   HCT 45.3 02/22/2023 0824   HCT 47.1 09/10/2016 0813   PLT 129.0 (L) 02/22/2023 0824   PLT 174 09/10/2016 0813   MCV 104.8 (H) 02/22/2023 0824   MCV 99 (H) 09/10/2016 0813   MCH 34.6 (H) 01/28/2020 0913   MCHC 33.3 02/22/2023 0824   RDW 13.6 02/22/2023 0824   RDW 13.5 09/10/2016 0813   LYMPHSABS 1.5 02/25/2022 0901   MONOABS 0.4 02/25/2022 0901   EOSABS 0.3 02/25/2022 0901   BASOSABS 0.0 02/25/2022 0901   Recent Labs    02/22/23 0824  HGB 15.1    CMP     Component Value Date/Time   NA 139 02/22/2023 0824   NA 139 06/22/2022 1027   K 4.4 02/22/2023 0824   CL 104 02/22/2023 0824   CO2 27 02/22/2023 0824   GLUCOSE 109 (H) 02/22/2023 0824   GLUCOSE 96 05/26/2006 1118   BUN 16 02/22/2023 0824   BUN 16 06/22/2022 1027   CREATININE 0.95 02/22/2023 0824   CREATININE 0.83 01/28/2020 0913   CALCIUM  9.0 02/22/2023 0824   PROT 6.7 02/22/2023 0824  PROT 6.8 06/22/2022 1027   ALBUMIN  3.8 02/22/2023 0824   ALBUMIN  4.1 06/22/2022 1027   AST 49 (H) 02/22/2023 0824   ALT 54 (H) 02/22/2023 0824   ALKPHOS 136 (H) 02/22/2023 0824   BILITOT 1.0 02/22/2023 0824   BILITOT 0.8 06/22/2022 1027   GFRNONAA 71 01/23/2019 1432   GFRAA 83 01/23/2019 1432      Latest Ref Rng & Units 02/22/2023    8:24 AM 06/22/2022   10:27 AM 02/25/2022    9:01 AM  Hepatic Function  Total Protein 6.0 - 8.3 g/dL 6.7  6.8  7.0   Albumin  3.5 - 5.2 g/dL 3.8  4.1  3.8   AST 0 - 37 U/L 49  58  71   ALT 0 - 53 U/L 54  74  79   Alk Phosphatase 39 - 117 U/L 136  134  120   Total Bilirubin 0.2 - 1.2 mg/dL 1.0  0.8  0.7       Current Medications:    Current Outpatient Medications (Cardiovascular):    atorvastatin  (LIPITOR) 10 MG tablet, TAKE 1 TABLET BY MOUTH EVERY DAY   metoprolol  succinate (TOPROL -XL) 50 MG 24 hr tablet, TAKE 1 TABLET BY MOUTH EVERY DAY WITH OR IMMEDIATELY FOLLOWING A MEAL   valsartan   (DIOVAN ) 160 MG tablet, TAKE ONE (1) TABLET BY MOUTH (160 MG) DAILY   Current Outpatient Medications (Analgesics):    acetaminophen  (TYLENOL ) 500 MG tablet, Take 500 mg by mouth every 6 (six) hours as needed for mild pain. PRN for pain/fever   aspirin  81 MG tablet, Take 81 mg by mouth at bedtime.  Current Outpatient Medications (Hematological):    clopidogrel  (PLAVIX ) 75 MG tablet, TAKE 1 TABLET BY MOUTH EVERY DAY  Current Outpatient Medications (Other):    Na Sulfate-K Sulfate-Mg Sulfate concentrate (SUPREP) 17.5-3.13-1.6 GM/177ML SOLN, Take 1 kit (354 mLs total) by mouth once for 1 dose.   Medical History:  Past Medical History:  Diagnosis Date   AAA (abdominal aortic aneurysm) (HCC) 08/2011   see Surgical history   Anginal pain (HCC)    Arthritis    knees; not bad   CAD (coronary artery disease)    DES to RCA and CFX; Dr. Wonda cardiologist   Depression ~ 2009   'treated for ~ 6 months   Dyslipidemia    HTN (hypertension)    unspecified essential   Hx of adenomatous colonic polyps 2008   Hyperplasia of prostate without lower urinary tract symptoms (LUTS)    Left lumbar radiculopathy    Other and unspecified hyperlipidemia    Stroke [facility])    Allergies: No Known Allergies   Surgical History:  He  has a past surgical history that includes Colonoscopy w/ polypectomy (2008); Cystoscopy (2005); Artery repair (09/06/2011); Appendectomy (1970); Coronary angioplasty with stent (2002; 2005); Tonsillectomy and adenoidectomy (1954); Vasectomy; Abdominal aortic aneurysm repair (09/06/2011); and Abdominal aortic aneurysm repair (01/21/2012). Family History:  His family history includes Colon polyps in his father; Coronary artery disease in his father, maternal grandfather, and paternal grandfather; Diabetes in his father; Hypertension in his father; Lung cancer in his father; Other in his mother.  REVIEW OF SYSTEMS  : All other systems reviewed and negative except where noted in the  History of Present Illness.  PHYSICAL EXAM: BP (!) 140/80   Pulse 65   Ht 6' (1.829 m)   Wt (!) 316 lb (143.3 kg)   BMI 42.86 kg/m  Physical Exam   GENERAL APPEARANCE: Obese, in  no apparent distress. HEENT: No cervical lymphadenopathy, unremarkable thyroid , sclerae anicteric, conjunctiva pink. RESPIRATORY: Respiratory effort normal, breath sounds equal bilaterally without rales, rhonchi, or wheezing. CARDIO: Regular rate and rhythm with no murmurs, rubs, or gallops, peripheral pulses intact. ABDOMEN: Soft, non-distended, obese, active bowel sounds in all four quadrants, no tenderness to palpation, no rebound. No fluid wave. + hepatomegaly. Swelling present, umbilical hernia present, easily reducible and non-tender, surgical scars present. + caput medusa.  RECTAL: Declines. MUSCULOSKELETAL: Full range of motion, normal gait, without edema. SKIN: Dry, intact without rashes or lesions. No jaundice. NEURO: Alert, oriented, no focal deficits. No astrexis.  PSYCH: Cooperative, normal mood and affect. EXTREMITIES: Non-pitting edema present.      Alan JONELLE Coombs, PA-C 10:22 AM

## 2024-01-10 ENCOUNTER — Ambulatory Visit: Payer: Self-pay | Admitting: Physician Assistant

## 2024-01-10 ENCOUNTER — Ambulatory Visit: Admitting: Physician Assistant

## 2024-01-10 ENCOUNTER — Telehealth: Payer: Self-pay

## 2024-01-10 ENCOUNTER — Encounter: Payer: Self-pay | Admitting: Physician Assistant

## 2024-01-10 ENCOUNTER — Other Ambulatory Visit

## 2024-01-10 ENCOUNTER — Other Ambulatory Visit (INDEPENDENT_AMBULATORY_CARE_PROVIDER_SITE_OTHER)

## 2024-01-10 VITALS — BP 140/80 | HR 65 | Ht 72.0 in | Wt 316.0 lb

## 2024-01-10 DIAGNOSIS — K219 Gastro-esophageal reflux disease without esophagitis: Secondary | ICD-10-CM | POA: Diagnosis not present

## 2024-01-10 DIAGNOSIS — Z1159 Encounter for screening for other viral diseases: Secondary | ICD-10-CM | POA: Diagnosis not present

## 2024-01-10 DIAGNOSIS — Z6841 Body Mass Index (BMI) 40.0 and over, adult: Secondary | ICD-10-CM

## 2024-01-10 DIAGNOSIS — K7581 Nonalcoholic steatohepatitis (NASH): Secondary | ICD-10-CM

## 2024-01-10 DIAGNOSIS — Z860101 Personal history of adenomatous and serrated colon polyps: Secondary | ICD-10-CM | POA: Diagnosis not present

## 2024-01-10 DIAGNOSIS — R7989 Other specified abnormal findings of blood chemistry: Secondary | ICD-10-CM | POA: Diagnosis not present

## 2024-01-10 DIAGNOSIS — D7589 Other specified diseases of blood and blood-forming organs: Secondary | ICD-10-CM

## 2024-01-10 DIAGNOSIS — D696 Thrombocytopenia, unspecified: Secondary | ICD-10-CM

## 2024-01-10 LAB — CBC WITH DIFFERENTIAL/PLATELET
Basophils Absolute: 0 K/uL (ref 0.0–0.1)
Basophils Relative: 1.1 % (ref 0.0–3.0)
Eosinophils Absolute: 0.3 K/uL (ref 0.0–0.7)
Eosinophils Relative: 8 % — ABNORMAL HIGH (ref 0.0–5.0)
HCT: 45.3 % (ref 39.0–52.0)
Hemoglobin: 15.4 g/dL (ref 13.0–17.0)
Lymphocytes Relative: 37.2 % (ref 12.0–46.0)
Lymphs Abs: 1.3 K/uL (ref 0.7–4.0)
MCHC: 34 g/dL (ref 30.0–36.0)
MCV: 101 fl — ABNORMAL HIGH (ref 78.0–100.0)
Monocytes Absolute: 0.4 K/uL (ref 0.1–1.0)
Monocytes Relative: 10.4 % (ref 3.0–12.0)
Neutro Abs: 1.5 K/uL (ref 1.4–7.7)
Neutrophils Relative %: 43.3 % (ref 43.0–77.0)
Platelets: 104 K/uL — ABNORMAL LOW (ref 150.0–400.0)
RBC: 4.49 Mil/uL (ref 4.22–5.81)
RDW: 14.1 % (ref 11.5–15.5)
WBC: 3.4 K/uL — ABNORMAL LOW (ref 4.0–10.5)

## 2024-01-10 LAB — GAMMA GT: GGT: 288 U/L — ABNORMAL HIGH (ref 7–51)

## 2024-01-10 LAB — HEPATIC FUNCTION PANEL
ALT: 56 U/L — ABNORMAL HIGH (ref 0–53)
AST: 54 U/L — ABNORMAL HIGH (ref 0–37)
Albumin: 3.9 g/dL (ref 3.5–5.2)
Alkaline Phosphatase: 143 U/L — ABNORMAL HIGH (ref 39–117)
Bilirubin, Direct: 0.2 mg/dL (ref 0.0–0.3)
Total Bilirubin: 0.9 mg/dL (ref 0.2–1.2)
Total Protein: 7.2 g/dL (ref 6.0–8.3)

## 2024-01-10 LAB — BASIC METABOLIC PANEL WITH GFR
BUN: 20 mg/dL (ref 6–23)
CO2: 27 meq/L (ref 19–32)
Calcium: 9.2 mg/dL (ref 8.4–10.5)
Chloride: 103 meq/L (ref 96–112)
Creatinine, Ser: 0.82 mg/dL (ref 0.40–1.50)
GFR: 87.58 mL/min (ref >60.00)
Glucose, Bld: 108 mg/dL — ABNORMAL HIGH (ref 70–99)
Potassium: 4.1 meq/L (ref 3.5–5.1)
Sodium: 138 meq/L (ref 135–145)

## 2024-01-10 LAB — IBC + FERRITIN
Ferritin: 539.5 ng/mL — ABNORMAL HIGH (ref 22.0–322.0)
Iron: 191 ug/dL — ABNORMAL HIGH (ref 42–165)
Saturation Ratios: 70 % — ABNORMAL HIGH (ref 20.0–50.0)
TIBC: 273 ug/dL (ref 250.0–450.0)
Transferrin: 195 mg/dL — ABNORMAL LOW (ref 212.0–360.0)

## 2024-01-10 LAB — PROTIME-INR
INR: 1.2 ratio — ABNORMAL HIGH (ref 0.8–1.0)
Prothrombin Time: 12.2 s (ref 9.6–13.1)

## 2024-01-10 MED ORDER — NA SULFATE-K SULFATE-MG SULF 17.5-3.13-1.6 GM/177ML PO SOLN: 1.0000 | Freq: Once | ORAL | 0 refills | Status: AC

## 2024-01-10 NOTE — Telephone Encounter (Signed)
 [facility] Pre-operative Risk Assessment     Request for surgical clearance:     Endoscopy Procedure  What type of surgery is being performed?     EGD/Colon   When is this surgery scheduled?     02-15-24  What type of clearance is required ?   Pharmacy  Are there any medications that need to be held prior to surgery and how long? Plavix  5 day hold prior  Practice name and name of physician performing surgery?      [facility]  What is your office phone and fax number?      Phone- [phone]  Fax- [phone]  Anesthesia type (None, local, MAC, general) ?       MAC   Please route your response to [facility])

## 2024-01-10 NOTE — Patient Instructions (Addendum)
 _______________________________________________________  If your blood pressure at your visit was 140/90 or greater, please contact your primary care physician to follow up on this.  _______________________________________________________  If you are age 73 or older, your body mass index should be between 23-30. Your Body mass index is 42.86 kg/m. If this is out of the aforementioned range listed, please consider follow up with your Primary Care Provider.  If you are age 47 or younger, your body mass index should be between 19-25. Your Body mass index is 42.86 kg/m. If this is out of the aformentioned range listed, please consider follow up with your Primary Care Provider.   ________________________________________________________  The [facility] providers would like to encourage you to use MYCHART to communicate with providers for non-urgent requests or questions.  Due to long hold times on the telephone, sending your provider a message by [facility] may be a faster and more efficient way to get a response.  Please allow 48 business hours for a response.  Please remember that this is for non-urgent requests.  _______________________________________________________  [facility] is using a team-based approach to care.  Your team is made up of your doctor and two to three APPS. Our APPS (Nurse Practitioners and Physician Assistants) work with your physician to ensure care continuity for you. They are fully qualified to address your health concerns and develop a treatment plan. They communicate directly with your gastroenterologist to care for you. Seeing the Advanced Practice Practitioners on your physician's team can help you by facilitating care more promptly, often allowing for earlier appointments, access to diagnostic testing, procedures, and other specialty referrals.   Your provider has requested that you go to the basement level for lab work before leaving today. Press B on the  elevator. The lab is located at the first door on the left as you exit the elevator.  You have been scheduled for an abdominal ultrasound at [facility] (1st floor of hospital) on 01-12-24 at 10am. Please arrive 15 minutes prior to your appointment for registration. Make certain not to have anything to eat or drink midnight prior to your appointment. Should you need to reschedule your appointment, please contact radiology at [phone]. This test typically takes about 30 minutes to perform.  You will be contacted by our office prior to your procedure for directions on holding your Plavix .  If you do not hear from our office 2 week prior to your scheduled procedure, please call [phone] to discuss.   You have been scheduled for an endoscopy and colonoscopy. Please follow the written instructions given to you at your visit today.  If you use inhalers (even only as needed), please bring them with you on the day of your procedure.  DO NOT TAKE 7 DAYS PRIOR TO TEST- Trulicity (dulaglutide) Ozempic, Wegovy (semaglutide) Mounjaro (tirzepatide) Bydureon Bcise (exanatide extended release)  DO NOT TAKE 1 DAY PRIOR TO YOUR TEST Rybelsus (semaglutide) Adlyxin (lixisenatide) Victoza (liraglutide) Byetta (exanatide) ___________________________________________________________________________  Diverticulosis Diverticulosis is a condition that develops when small pouches (diverticula) form in the wall of the large intestine (colon). The colon is where water is absorbed and stool (feces) is formed. The pouches form when the inside layer of the colon pushes through weak spots in the outer layers of the colon. You may have a few pouches or many of them. The pouches usually do not cause problems unless they become inflamed or infected. When this happens, the condition is called diverticulitis- this is left lower quadrant pain, diarrhea, fever,  chills, nausea or vomiting.  If this occurs please  call the office or go to the hospital. Sometimes these patches without inflammation can also have painless bleeding associated with them, if this happens please call the office or go to the hospital. Preventing constipation and increasing fiber can help reduce diverticula and prevent complications. Even if you feel you have a high-fiber diet, suggest getting on Benefiber or Cirtracel 2 times daily.  Metabolic dysfunction associated seatohepatitis  Now the leading cause of liver failure in the [place] .  It is normally from such risk factors as obesity, diabetes, insulin  resistance, high cholesterol, or metabolic syndrome.  The only definitive therapy is weight loss and exercise.   Suggest walking 20-30 mins daily.  Decreasing carbohydrates, increasing veggies.    Fatty Liver Fatty liver is the accumulation of fat in liver cells. It is also called hepatosteatosis or steatohepatitis. It is normal for your liver to contain some fat. If fat is more than 5 to 10% of your liver's weight, you have fatty liver.  There are often no symptoms (problems) for years while damage is still occurring. People often learn about their fatty liver when they have medical tests for other reasons. Fat can damage your liver for years or even decades without causing problems. When it becomes severe, it can cause fatigue, weight loss, weakness, and confusion. This makes you more likely to develop more serious liver problems. The liver is the largest organ in the body. It does a lot of work and often gives no warning signs when it is sick until late in a disease. The liver has many important jobs including: Breaking down foods. Storing vitamins, iron, and other minerals. Making proteins. Making bile for food digestion. Breaking down many products including medications, alcohol and some poisons.  PROGNOSIS  Fatty liver may cause no damage or it can lead to an inflammation of the liver. This is, called  steatohepatitis.  Over time the liver may become scarred and hardened. This condition is called cirrhosis. Cirrhosis is serious and may lead to liver failure or cancer. NASH is one of the leading causes of cirrhosis. About 10-20% of Americans have fatty liver and a smaller 2-5% has NASH.  TREATMENT  Weight loss, fat restriction, and exercise in overweight patients produces inconsistent results but is worth trying. Good control of diabetes may reduce fatty liver. Eat a balanced, healthy diet. Increase your physical activity. There are no medical or surgical treatments for a fatty liver or NASH, but improving your diet and increasing your exercise may help prevent or reverse some of the damage.   It was a pleasure to see you today!  Thank you for trusting me with your gastrointestinal care!

## 2024-01-11 ENCOUNTER — Telehealth: Payer: Self-pay

## 2024-01-11 NOTE — Telephone Encounter (Signed)
 Pt scheduled for VV on 02/06/24.

## 2024-01-11 NOTE — Telephone Encounter (Signed)
   Name: Alfred [name]  DOB: 30-Nov-1950  MRN: [id number]  Primary Cardiologist: Ozell Fell, MD   Preoperative team, please contact this patient and set up a phone call appointment for further preoperative risk assessment. Please obtain consent and complete medication review. Thank you for your help.  I confirm that guidance regarding antiplatelet and oral anticoagulation therapy has been completed and, if necessary, noted below.  Per office protocol, if patient is without any new symptoms or concerns at the time of their virtual visit, he may hold Plavix  for 5 days and Aspirin  for 7 days prior to procedure. Please resume Aspirin  and Plavix  as soon as possible postprocedure, at the discretion of the surgeon.    I also confirmed the patient resides in the state of [place] . As per [place] [facility] [employer] telemedicine laws, the patient must reside in the state in which the provider is licensed.   Lamarr Satterfield, NP 01/11/2024, 8:33 AM [facility]

## 2024-01-11 NOTE — Telephone Encounter (Signed)
 We are still waiting on your other liver function but your iron and iron saturation and ferritin were very elevated for you not being on an iron supplementation. This can be elevated sometimes with inflammation or sometimes with alcohol however with your liver function and the degree of elevation I am going to have you come back in for a lab to check for something called hemochromatosis which is genetic iron storage disease.  This could be contributing to the liver function.

## 2024-01-11 NOTE — Telephone Encounter (Signed)
  Patient Consent for Virtual Visit        Alfred [name] has provided verbal consent on 01/11/2024 for a virtual visit (video or telephone).   CONSENT FOR VIRTUAL VISIT FOR:  Alfred [name]  By participating in this virtual visit I agree to the following:  I hereby voluntarily request, consent and authorize [facility] and its employed or contracted physicians, physician assistants, nurse practitioners or other licensed health care professionals (the Practitioner), to provide me with telemedicine health care services (the "Services) as deemed necessary by the treating Practitioner. I acknowledge and consent to receive the Services by the Practitioner via telemedicine. I understand that the telemedicine visit will involve communicating with the Practitioner through live audiovisual communication technology and the disclosure of certain medical information by electronic transmission. I acknowledge that I have been given the opportunity to request an in-person assessment or other available alternative prior to the telemedicine visit and am voluntarily participating in the telemedicine visit.  I understand that I have the right to withhold or withdraw my consent to the use of telemedicine in the course of my care at any time, without affecting my right to future care or treatment, and that the Practitioner or I may terminate the telemedicine visit at any time. I understand that I have the right to inspect all information obtained and/or recorded in the course of the telemedicine visit and may receive copies of available information for a reasonable fee.  I understand that some of the potential risks of receiving the Services via telemedicine include:  Delay or interruption in medical evaluation due to technological equipment failure or disruption; Information transmitted may not be sufficient (e.g. poor resolution of images) to allow for appropriate medical decision making by the Practitioner;  and/or  In rare instances, security protocols could fail, causing a breach of personal health information.  Furthermore, I acknowledge that it is my responsibility to provide information about my medical history, conditions and care that is complete and accurate to the best of my ability. I acknowledge that Practitioner's advice, recommendations, and/or decision may be based on factors not within their control, such as incomplete or inaccurate data provided by me or distortions of diagnostic images or specimens that may result from electronic transmissions. I understand that the practice of medicine is not an exact science and that Practitioner makes no warranties or guarantees regarding treatment outcomes. I acknowledge that a copy of this consent can be made available to me via my patient portal [facility] MyChart), or I can request a printed copy by calling the office of [facility].    I understand that my insurance will be billed for this visit.   I have read or had this consent read to me. I understand the contents of this consent, which adequately explains the benefits and risks of the Services being provided via telemedicine.  I have been provided ample opportunity to ask questions regarding this consent and the Services and have had my questions answered to my satisfaction. I give my informed consent for the services to be provided through the use of telemedicine in my medical care

## 2024-01-12 ENCOUNTER — Ambulatory Visit (HOSPITAL_COMMUNITY)
Admission: RE | Admit: 2024-01-12 | Discharge: 2024-01-12 | Disposition: A | Discharge status: Home / Self Care | Source: Ambulatory Visit | Attending: Physician Assistant | Admitting: Physician Assistant

## 2024-01-12 ENCOUNTER — Ambulatory Visit (HOSPITAL_COMMUNITY): Admission: RE | Admit: 2024-01-12 | Source: Ambulatory Visit

## 2024-01-12 ENCOUNTER — Other Ambulatory Visit (INDEPENDENT_AMBULATORY_CARE_PROVIDER_SITE_OTHER)

## 2024-01-12 DIAGNOSIS — K760 Fatty (change of) liver, not elsewhere classified: Secondary | ICD-10-CM | POA: Diagnosis not present

## 2024-01-12 DIAGNOSIS — R932 Abnormal findings on diagnostic imaging of liver and biliary tract: Secondary | ICD-10-CM | POA: Diagnosis not present

## 2024-01-12 DIAGNOSIS — K7581 Nonalcoholic steatohepatitis (NASH): Secondary | ICD-10-CM | POA: Insufficient documentation

## 2024-01-12 DIAGNOSIS — K759 Inflammatory liver disease, unspecified: Secondary | ICD-10-CM | POA: Diagnosis not present

## 2024-01-12 DIAGNOSIS — R7989 Other specified abnormal findings of blood chemistry: Secondary | ICD-10-CM

## 2024-01-12 DIAGNOSIS — K709 Alcoholic liver disease, unspecified: Secondary | ICD-10-CM | POA: Diagnosis not present

## 2024-01-19 LAB — ANTI-NUCLEAR AB-TITER (ANA TITER)
ANA TITER: 1:80 {titer} — ABNORMAL HIGH
ANA Titer 1: 1:40 {titer} — ABNORMAL HIGH

## 2024-01-19 LAB — HEPATITIS C ANTIBODY: Hepatitis C Ab: NONREACTIVE

## 2024-01-19 LAB — IGG: IgG (Immunoglobin G), Serum: 1236 mg/dL (ref 600–1540)

## 2024-01-19 LAB — TISSUE TRANSGLUTAMINASE, IGA: (tTG) Ab, IgA: <1 U/mL

## 2024-01-19 LAB — HEPATITIS A ANTIBODY, TOTAL: Hepatitis A AB,Total: REACTIVE — AB

## 2024-01-19 LAB — HEPATITIS B SURFACE ANTIGEN: Hepatitis B Surface Ag: NONREACTIVE

## 2024-01-19 LAB — ANTI-SMOOTH MUSCLE ANTIBODY, IGG: Actin (Smooth Muscle) Antibody (IGG): <20 U (ref <20)

## 2024-01-19 LAB — HEPATITIS B SURFACE ANTIBODY,QUALITATIVE: Hep B S Ab: NONREACTIVE

## 2024-01-19 LAB — IGA: Immunoglobulin A: 378 mg/dL — ABNORMAL HIGH (ref 70–320)

## 2024-01-19 LAB — MITOCHONDRIAL ANTIBODIES: Mitochondrial M2 Ab, IgG: <=20 U (ref <=20.0)

## 2024-01-19 LAB — ANA: Anti Nuclear Antibody (ANA): POSITIVE — AB

## 2024-01-25 LAB — HEMOCHROMATOSIS DNA-PCR(C282Y,H63D)

## 2024-01-26 ENCOUNTER — Other Ambulatory Visit: Payer: Self-pay

## 2024-01-26 ENCOUNTER — Ambulatory Visit: Payer: Self-pay | Admitting: Physician Assistant

## 2024-01-26 DIAGNOSIS — R7989 Other specified abnormal findings of blood chemistry: Secondary | ICD-10-CM

## 2024-02-02 NOTE — Progress Notes (Signed)
 Agree with the assessment and plan as outlined by Quentin Mulling, PA-C. ? ?Keron Neenan, DO, FACG ? ?

## 2024-02-06 ENCOUNTER — Ambulatory Visit: Attending: Cardiovascular Disease

## 2024-02-06 DIAGNOSIS — Z01810 Encounter for preprocedural cardiovascular examination: Secondary | ICD-10-CM | POA: Diagnosis not present

## 2024-02-06 NOTE — Telephone Encounter (Signed)
 Patient made aware to stop Plavix  5 days prior and can take baby aspirin  during that time and he says he has his prep too now

## 2024-02-06 NOTE — Progress Notes (Signed)
 Virtual Visit via Telephone Note   Because of Alfred [name] co-morbid illnesses, he is at least at moderate risk for complications without adequate follow up.  This format is felt to be most appropriate for this patient at this time.  Due to technical limitations with video connection (technology), today's appointment will be conducted as an audio only telehealth visit, and TRIGO WINTERBOTTOM verbally agreed to proceed in this manner.   All issues noted in this document were discussed and addressed.  No physical exam could be performed with this format.  Evaluation Performed:  Preoperative cardiovascular risk assessment _____________   Date:  02/06/2024   Patient ID:  Alfred [name], DOB June 07, 1950, MRN [id number] Patient Location:  Home Provider location:   Office  Primary Care Provider:  Kennyth Worth HERO, MD Primary Cardiologist:  Ozell Fell, MD  Chief Complaint / Patient Profile   73 y.o. y/o male with a h/o essential hypertension, coronary artery disease, hyperlipidemia, AAA who is pending EGD/colonoscopy and presents today for telephonic preoperative cardiovascular risk assessment.  History of Present Illness    Alfred [name] is a 73 y.o. male who presents via audio/video conferencing for a telehealth visit today.  Pt was last seen in cardiology clinic on 06/30/2023 by Dr. Fell.  At that time [name] was doing well .  The patient is now pending procedure as outlined above. Since his last visit, he continues to be stable from a cardiac standpoint.  Today he denies chest pain, shortness of breath, lower extremity edema, fatigue, palpitations, melena, hematuria, hemoptysis, diaphoresis, weakness, presyncope, syncope, orthopnea, and PND.   Past Medical History    Past Medical History:  Diagnosis Date   AAA (abdominal aortic aneurysm) (HCC) 08/2011   see Surgical history   Anginal pain (HCC)    Arthritis    knees; not bad   CAD (coronary artery disease)    DES to  RCA and CFX; Dr. Fell cardiologist   Depression ~ 2009   'treated for ~ 6 months   Dyslipidemia    HTN (hypertension)    unspecified essential   Hx of adenomatous colonic polyps 2008   Hyperplasia of prostate without lower urinary tract symptoms (LUTS)    Left lumbar radiculopathy    Other and unspecified hyperlipidemia    Stroke [facility])    Past Surgical History:  Procedure Laterality Date   ABDOMINAL AORTIC ANEURYSM REPAIR  09/06/2011   ABDOMINAL AORTIC ANEURYSM REPAIR  01/21/2012   Procedure: ANEURYSM ABDOMINAL AORTIC REPAIR;  Surgeon: Carlin FORBES Haddock, MD;  Location: [facility] OR;  Service: Vascular;  Laterality: N/A;  Aortio- bi-iliac repair  using Cryolife descending thoracic aorta 14mm x10.5 cm.   APPENDECTOMY  1970   ARTERY REPAIR  09/06/2011   Procedure: ARTERY REPAIR;  Surgeon: Carlin FORBES Haddock, MD;  Location: [facility] OR;  Service: Vascular;  Laterality: Bilateral;   COLONOSCOPY W/ POLYPECTOMY  2008   adenomatous  & hyperplastic polyp;due 2013   CORONARY ANGIOPLASTY WITH STENT PLACEMENT  2002; 2005   2 +1; total of 3   CYSTOSCOPY  2005   varicose veins in bladder, Dr matilda   TONSILLECTOMY AND ADENOIDECTOMY  1954   VASECTOMY      Allergies  No Known Allergies  Home Medications    Prior to Admission medications   Medication Sig Start Date End Date Taking? Authorizing Provider  acetaminophen  (TYLENOL ) 500 MG tablet Take 500 mg by mouth every 6 (six) hours as needed for mild pain.  PRN for pain/fever 01/28/12   Roczniak, Regina J, PA-C  aspirin  81 MG tablet Take 81 mg by mouth at bedtime.    [provider]  atorvastatin  (LIPITOR) 10 MG tablet TAKE 1 TABLET BY MOUTH EVERY DAY 07/01/23   Wonda Sharper, MD  clopidogrel  (PLAVIX ) 75 MG tablet TAKE 1 TABLET BY MOUTH EVERY DAY 07/01/23   Wonda Sharper, MD  metoprolol  succinate (TOPROL -XL) 50 MG 24 hr tablet TAKE 1 TABLET BY MOUTH EVERY DAY WITH OR IMMEDIATELY FOLLOWING A MEAL 07/01/23   Wonda Sharper, MD  valsartan  (DIOVAN )  160 MG tablet TAKE ONE (1) TABLET BY MOUTH (160 MG) DAILY 07/01/23   Wonda Sharper, MD    Physical Exam    Vital Signs:  Alfred [name] does not have vital signs available for review today.  Given telephonic nature of communication, physical exam is limited. AAOx3. NAD. Normal affect.  Speech and respirations are unlabored.  Accessory Clinical Findings    None  Assessment & Plan    1.  Preoperative Cardiovascular Risk Assessment: Colonoscopy/EGD, 02/15/2024, [place] [facility], fax #[phone]      Primary Cardiologist: Sharper Wonda, MD  Chart reviewed as part of pre-operative protocol coverage. Given past medical history and time since last visit, based on ACC/AHA guidelines, CORA STETSON would be at acceptable risk for the planned procedure without further cardiovascular testing.   Patient was advised that if he develops new symptoms prior to surgery to contact our office to arrange a follow-up appointment.  He verbalized understanding.  He may hold Plavix  for 5 days and Aspirin  for 7 days prior to procedure. Please resume Aspirin  and Plavix  as soon as possible postprocedure, at the discretion of the surgeon.    I will route this recommendation to the requesting party via Epic fax function and remove from pre-op pool.       Time:   Today, I have spent 6 minutes with the patient with telehealth technology discussing medical history, symptoms, and management plan.  I spent 10 minutes reviewing patient's past cardiac history and cardiac medications.    Josefa CHRISTELLA Beauvais, NP  02/06/2024, 6:57 AM

## 2024-02-10 ENCOUNTER — Encounter: Payer: Self-pay | Admitting: Oncology

## 2024-02-10 ENCOUNTER — Inpatient Hospital Stay

## 2024-02-10 ENCOUNTER — Other Ambulatory Visit: Payer: Self-pay | Admitting: Oncology

## 2024-02-10 ENCOUNTER — Inpatient Hospital Stay: Attending: Oncology | Admitting: Oncology

## 2024-02-10 VITALS — BP 136/89 | HR 57 | Temp 97.2°F | Resp 17 | Ht 72.0 in | Wt 319.0 lb

## 2024-02-10 DIAGNOSIS — Z79899 Other long term (current) drug therapy: Secondary | ICD-10-CM | POA: Insufficient documentation

## 2024-02-10 DIAGNOSIS — Z801 Family history of malignant neoplasm of trachea, bronchus and lung: Secondary | ICD-10-CM | POA: Insufficient documentation

## 2024-02-10 DIAGNOSIS — F1090 Alcohol use, unspecified, uncomplicated: Secondary | ICD-10-CM

## 2024-02-10 DIAGNOSIS — F32A Depression, unspecified: Secondary | ICD-10-CM | POA: Diagnosis not present

## 2024-02-10 DIAGNOSIS — I2510 Atherosclerotic heart disease of native coronary artery without angina pectoris: Secondary | ICD-10-CM | POA: Diagnosis not present

## 2024-02-10 DIAGNOSIS — R7989 Other specified abnormal findings of blood chemistry: Secondary | ICD-10-CM

## 2024-02-10 DIAGNOSIS — Z8673 Personal history of transient ischemic attack (TIA), and cerebral infarction without residual deficits: Secondary | ICD-10-CM | POA: Diagnosis not present

## 2024-02-10 DIAGNOSIS — Z9089 Acquired absence of other organs: Secondary | ICD-10-CM | POA: Insufficient documentation

## 2024-02-10 DIAGNOSIS — Z7982 Long term (current) use of aspirin: Secondary | ICD-10-CM | POA: Insufficient documentation

## 2024-02-10 DIAGNOSIS — K709 Alcoholic liver disease, unspecified: Secondary | ICD-10-CM | POA: Diagnosis not present

## 2024-02-10 DIAGNOSIS — F17220 Nicotine dependence, chewing tobacco, uncomplicated: Secondary | ICD-10-CM | POA: Insufficient documentation

## 2024-02-10 DIAGNOSIS — Z9049 Acquired absence of other specified parts of digestive tract: Secondary | ICD-10-CM | POA: Diagnosis not present

## 2024-02-10 DIAGNOSIS — K219 Gastro-esophageal reflux disease without esophagitis: Secondary | ICD-10-CM | POA: Diagnosis not present

## 2024-02-10 DIAGNOSIS — D7589 Other specified diseases of blood and blood-forming organs: Secondary | ICD-10-CM | POA: Diagnosis not present

## 2024-02-10 DIAGNOSIS — N400 Enlarged prostate without lower urinary tract symptoms: Secondary | ICD-10-CM | POA: Insufficient documentation

## 2024-02-10 DIAGNOSIS — K760 Fatty (change of) liver, not elsewhere classified: Secondary | ICD-10-CM | POA: Insufficient documentation

## 2024-02-10 DIAGNOSIS — Z8679 Personal history of other diseases of the circulatory system: Secondary | ICD-10-CM | POA: Insufficient documentation

## 2024-02-10 DIAGNOSIS — I10 Essential (primary) hypertension: Secondary | ICD-10-CM | POA: Insufficient documentation

## 2024-02-10 DIAGNOSIS — Z860101 Personal history of adenomatous and serrated colon polyps: Secondary | ICD-10-CM | POA: Diagnosis not present

## 2024-02-10 DIAGNOSIS — Z8249 Family history of ischemic heart disease and other diseases of the circulatory system: Secondary | ICD-10-CM | POA: Insufficient documentation

## 2024-02-10 DIAGNOSIS — E785 Hyperlipidemia, unspecified: Secondary | ICD-10-CM | POA: Insufficient documentation

## 2024-02-10 DIAGNOSIS — Z7289 Other problems related to lifestyle: Secondary | ICD-10-CM | POA: Diagnosis not present

## 2024-02-10 DIAGNOSIS — Z833 Family history of diabetes mellitus: Secondary | ICD-10-CM | POA: Insufficient documentation

## 2024-02-10 DIAGNOSIS — Z83719 Family history of colon polyps, unspecified: Secondary | ICD-10-CM | POA: Insufficient documentation

## 2024-02-10 DIAGNOSIS — D696 Thrombocytopenia, unspecified: Secondary | ICD-10-CM | POA: Diagnosis not present

## 2024-02-10 DIAGNOSIS — D72819 Decreased white blood cell count, unspecified: Secondary | ICD-10-CM | POA: Insufficient documentation

## 2024-02-10 LAB — CMP (CANCER CENTER ONLY)
ALT: 52 U/L — ABNORMAL HIGH (ref 0–44)
AST: 48 U/L — ABNORMAL HIGH (ref 15–41)
Albumin: 4 g/dL (ref 3.5–5.0)
Alkaline Phosphatase: 139 U/L — ABNORMAL HIGH (ref 38–126)
Anion gap: 5 (ref 5–15)
BUN: 15 mg/dL (ref 8–23)
CO2: 29 mmol/L (ref 22–32)
Calcium: 9.2 mg/dL (ref 8.9–10.3)
Chloride: 104 mmol/L (ref 98–111)
Creatinine: 0.96 mg/dL (ref 0.61–1.24)
GFR, Estimated: >60 mL/min (ref >60)
Glucose, Bld: 102 mg/dL — ABNORMAL HIGH (ref 70–99)
Potassium: 4.2 mmol/L (ref 3.5–5.1)
Sodium: 138 mmol/L (ref 135–145)
Total Bilirubin: 0.8 mg/dL (ref 0.0–1.2)
Total Protein: 7.2 g/dL (ref 6.5–8.1)

## 2024-02-10 LAB — CBC WITH DIFFERENTIAL (CANCER CENTER ONLY)
Abs Immature Granulocytes: 0.01 K/uL (ref 0.00–0.07)
Basophils Absolute: 0 K/uL (ref 0.0–0.1)
Basophils Relative: 1 %
Eosinophils Absolute: 0.4 K/uL (ref 0.0–0.5)
Eosinophils Relative: 9 %
HCT: 44.9 % (ref 39.0–52.0)
Hemoglobin: 15.3 g/dL (ref 13.0–17.0)
Immature Granulocytes: 0 %
Lymphocytes Relative: 37 %
Lymphs Abs: 1.4 K/uL (ref 0.7–4.0)
MCH: 34.2 pg — ABNORMAL HIGH (ref 26.0–34.0)
MCHC: 34.1 g/dL (ref 30.0–36.0)
MCV: 100.2 fL — ABNORMAL HIGH (ref 80.0–100.0)
Monocytes Absolute: 0.3 K/uL (ref 0.1–1.0)
Monocytes Relative: 8 %
Neutro Abs: 1.8 K/uL (ref 1.7–7.7)
Neutrophils Relative %: 45 %
Platelet Count: 96 K/uL — ABNORMAL LOW (ref 150–400)
RBC: 4.48 MIL/uL (ref 4.22–5.81)
RDW: 12.7 % (ref 11.5–15.5)
WBC Count: 3.9 K/uL — ABNORMAL LOW (ref 4.0–10.5)
nRBC: 0 % (ref 0.0–0.2)

## 2024-02-10 LAB — IRON AND IRON BINDING CAPACITY (CC-WL,HP ONLY)
Iron: 186 ug/dL — ABNORMAL HIGH (ref 45–182)
Saturation Ratios: 69 % — ABNORMAL HIGH (ref 17.9–39.5)
TIBC: 270 ug/dL (ref 250–450)
UIBC: 84 ug/dL — ABNORMAL LOW (ref 117–376)

## 2024-02-10 LAB — FERRITIN: Ferritin: 752 ng/mL — ABNORMAL HIGH (ref 24–336)

## 2024-02-10 LAB — LACTATE DEHYDROGENASE: LDH: 178 U/L (ref 98–192)

## 2024-02-10 NOTE — Assessment & Plan Note (Signed)
 Chronic intermittent thrombocytopenia with platelet counts ranging from 120,000 to 200,000 since 2013. Leukopenia with white blood cell counts fluctuating between 2,900 and 4,000. Macrocytosis with MCV 101-104 since 2022. Alcohol use may contribute to cytopenias. Potential for bone marrow recovery with reduced alcohol intake. - Monitor blood counts to assess for improvement with reduced alcohol intake.

## 2024-02-10 NOTE — Assessment & Plan Note (Signed)
 Consuming 1-2 drinks of vodka nightly for six years, recently reduced to one shot per day. Impact on liver function and blood counts discussed. - Encourage reduction or cessation of alcohol intake.

## 2024-02-10 NOTE — Assessment & Plan Note (Signed)
 Elevated ferritin at 540, increased iron at 191, and iron saturation at 70% suggest iron overload.   Repeat labs today continue to show elevated ferritin of 752, iron saturation increased at 16%, iron increased at 186.    Clinical picture is concerning for hemochromatosis.  We will obtain HFE gene mutation analysis to include C282Y, H63D, S65C mutations for further evaluation.    Potential for organ damage due to iron deposition in liver, pancreas, skin, and heart. If homozygosity for C282Y mutation is noted, it would indicate hereditary hemochromatosis, while a mix of mutations may have a varying phenotypic expression. Ferritin goal below 50 if true hemochromatosis is present, otherwise goal is 200.  - We will arrange for therapeutic phlebotomy if hemochromatosis is confirmed.  - Monitor ferritin levels with a goal of below 50 if true hemochromatosis is present.  I will discuss results over the phone next week.  Additional workup including echocardiogram will be considered depending on results.  For now tentatively I will plan to see him in 4 weeks for follow-up.

## 2024-02-10 NOTE — Progress Notes (Signed)
 [facility]  HEMATOLOGY CLINIC CONSULTATION NOTE   PATIENT NAME: Alfred [name]   MR#: [id number] DOB: 02-12-1951  DATE OF SERVICE: 02/10/2024   REFERRING PROVIDER  Alan Coombs, PA-C  Patient Care Team: Kennyth Worth HERO, MD as PCP - General (Family Medicine) Wonda Sharper, MD as PCP - Cardiology (Cardiology) Pandora Cadet, [facility] as Pharmacist (Pharmacist) Coombs Alan SAUNDERS, PA-C (Gastroenterology)   REASON FOR CONSULTATION/ CHIEF COMPLAINT:  Elevated ferritin and iron saturation level, concern for hemochromatosis  ASSESSMENT & PLAN:  Alfred [name] is a 73 y.o. gentleman with a past medical history of CAD status post PCI, history of CVA/AAA repair, hypertension, depression, BPH, GERD, was referred to our service for evaluation of possible hemochromatosis, given elevated ferritin and iron saturation level.    Elevated ferritin level Elevated ferritin at 540, increased iron at 191, and iron saturation at 70% suggest iron overload.   Repeat labs today continue to show elevated ferritin of 752, iron saturation increased at 16%, iron increased at 186.    Clinical picture is concerning for hemochromatosis.  We will obtain HFE gene mutation analysis to include C282Y, H63D, S65C mutations for further evaluation.    Potential for organ damage due to iron deposition in liver, pancreas, skin, and heart. If homozygosity for C282Y mutation is noted, it would indicate hereditary hemochromatosis, while a mix of mutations may have a varying phenotypic expression. Ferritin goal below 50 if true hemochromatosis is present, otherwise goal is 200.  - We will arrange for therapeutic phlebotomy if hemochromatosis is confirmed.  - Monitor ferritin levels with a goal of below 50 if true hemochromatosis is present.  I will discuss results over the phone next week.  Additional workup including echocardiogram will be considered depending on results.  For now tentatively I will  plan to see him in 4 weeks for follow-up.  Thrombocytopenia (HCC) Chronic intermittent thrombocytopenia with platelet counts ranging from 120,000 to 200,000 since 2013. Leukopenia with white blood cell counts fluctuating between 2,900 and 4,000. Macrocytosis with MCV 101-104 since 2022. Alcohol use may contribute to cytopenias. Potential for bone marrow recovery with reduced alcohol intake. - Monitor blood counts to assess for improvement with reduced alcohol intake.  Alcohol use Consuming 1-2 drinks of vodka nightly for six years, recently reduced to one shot per day. Impact on liver function and blood counts discussed. - Encourage reduction or cessation of alcohol intake.  Elevated liver enzymes Mildly elevated AST at 54, ALT at 56, and alkaline phosphatase at 643. Total bilirubin is normal. Alcohol use may contribute to liver enzyme elevation. Potential for improvement with reduced alcohol intake. - Monitor liver function tests to assess for improvement with reduced alcohol intake.    I reviewed lab results and outside records for this visit and discussed relevant results with the patient. Diagnosis, plan of care and treatment options were also discussed in detail with the patient. Opportunity provided to ask questions and answers provided to his apparent satisfaction. Provided instructions to call our clinic with any problems, questions or concerns prior to return visit. I recommended to continue follow-up with PCP and sub-specialists. He verbalized understanding and agreed with the plan. No barriers to learning was detected.  Chinita Patten, MD  02/10/2024 2:42 PM   [facility] - A DEPT OF [facility] [address] [place] [address] Dept: [phone] Dept Fax: [phone]   HISTORY OF PRESENT ILLNESS:  Discussed  the use of AI scribe software for clinical note transcription with the patient, who gave verbal consent  to proceed.  History of Present Illness Alfred [name] is a 73 year old male who presents for evaluation of elevated iron levels and possible hemochromatosis. He was referred by his gastroenterologist for further evaluation of elevated iron levels and possible hemochromatosis.  Recent laboratory tests from January 10, 2024, revealed elevated ferritin at 540, increased iron at 191, and iron saturation at 70%. Liver function tests showed AST at 54, ALT at 56, and alkaline phosphatase mildly elevated at 143, with normal total bilirubin at 0.9. These findings raised concerns for hemochromatosis, leading to the referral.  He has a history of chronic intermittent thrombocytopenia since 2013, with platelet counts ranging from 120,000 to 200,000. He notes that his red blood cells are larger than normal, with recent measurements showing a size of 101, slightly above the normal range of 80 to 100.  He has been followed in the GI clinic for adenomatous polyps, first diagnosed in 2019, and has been undergoing regular colonoscopies for surveillance. He was noted to have elevated liver function tests during these evaluations.  He consumes one to two drinks of vodka nightly for the last six years, which he has recently reduced to one shot per day over the past three to four weeks.  He mentions a significant amount of stress due to a recent divorce finalized in January after 41 years of marriage, which he believes may be affecting his health. He describes himself as having internalized stress over the years.   MEDICAL HISTORY Past Medical History:  Diagnosis Date   AAA (abdominal aortic aneurysm) (HCC) 08/2011   see Surgical history   Anginal pain (HCC)    Arthritis    knees; not bad   CAD (coronary artery disease)    DES to RCA and CFX; Dr. Wonda cardiologist   Depression ~ 2009   'treated for ~ 6 months   Dyslipidemia    HTN (hypertension)    unspecified essential   Hx of adenomatous colonic  polyps 2008   Hyperplasia of prostate without lower urinary tract symptoms (LUTS)    Left lumbar radiculopathy    Other and unspecified hyperlipidemia    Stroke [facility])      SURGICAL HISTORY Past Surgical History:  Procedure Laterality Date   ABDOMINAL AORTIC ANEURYSM REPAIR  09/06/2011   ABDOMINAL AORTIC ANEURYSM REPAIR  01/21/2012   Procedure: ANEURYSM ABDOMINAL AORTIC REPAIR;  Surgeon: Carlin FORBES Haddock, MD;  Location: [facility] OR;  Service: Vascular;  Laterality: N/A;  Aortio- bi-iliac repair  using Cryolife descending thoracic aorta 14mm x10.5 cm.   APPENDECTOMY  1970   ARTERY REPAIR  09/06/2011   Procedure: ARTERY REPAIR;  Surgeon: Carlin FORBES Haddock, MD;  Location: [facility] OR;  Service: Vascular;  Laterality: Bilateral;   COLONOSCOPY W/ POLYPECTOMY  2008   adenomatous  & hyperplastic polyp;due 2013   CORONARY ANGIOPLASTY WITH STENT PLACEMENT  2002; 2005   2 +1; total of 3   CYSTOSCOPY  2005   varicose veins in bladder, Dr matilda   TONSILLECTOMY AND ADENOIDECTOMY  1954   VASECTOMY       SOCIAL HISTORY: He reports that he quit smoking about 30 years ago. His smoking use included cigarettes. He started smoking about 57 years ago. He has a 27 pack-year smoking history. His smokeless tobacco use includes snuff. He reports current alcohol use of about 21.0 standard drinks of alcohol per week. He reports  that he does not use drugs. Social History   Socioeconomic History   Marital status: Divorced    Spouse name: Not on file   Number of children: 4   Years of education: Not on file   Highest education level: Not on file  Occupational History   Occupation: Farming  Tobacco Use   Smoking status: Former    Current packs/day: 0.00    Average packs/day: 1 pack/day for 27.0 years (27.0 ttl pk-yrs)    Types: Cigarettes    Start date: 05/31/1966    Quit date: 05/31/1993    Years since quitting: 30.7   Smokeless tobacco: Current    Types: Snuff   Tobacco comments:    smoked 1968-1995, up to 1 ppd.  Currently using Skoal 1 can / day  Vaping Use   Vaping status: Never Used  Substance and Sexual Activity   Alcohol use: Yes    Alcohol/week: 21.0 standard drinks of alcohol    Types: 21 Shots of liquor per week    Comment: 2-3 drinks of Vodka a night, occasional beer   Drug use: No   Sexual activity: Yes  Other Topics Concern   Not on file  Social History Narrative   Not on file   Social Drivers of Health   Financial Resource Strain: Low Risk  (07/05/2023)   Overall Financial Resource Strain (CARDIA)    Difficulty of Paying Living Expenses: Not hard at all  Food Insecurity: No Food Insecurity (02/10/2024)   Hunger Vital Sign    Worried About Running Out of Food in the Last Year: Never true    Ran Out of Food in the Last Year: Never true  Transportation Needs: No Transportation Needs (02/10/2024)   PRAPARE - Administrator, Civil Service (Medical): No    Lack of Transportation (Non-Medical): No  Physical Activity: Inactive (07/05/2023)   Exercise Vital Sign    Days of Exercise per Week: 0 days    Minutes of Exercise per Session: 0 min  Stress: No Stress Concern Present (07/05/2023)   [employer] of Occupational Health - Occupational Stress Questionnaire    Feeling of Stress : Not at all  Social Connections: Moderately Isolated (07/05/2023)   Social Connection and Isolation Panel    Frequency of Communication with Friends and Family: More than three times a week    Frequency of Social Gatherings with Friends and Family: More than three times a week    Attends Religious Services: 1 to 4 times per year    Active Member of [employer] or Organizations: No    Attends Banker Meetings: Never    Marital Status: Divorced  Catering manager Violence: Not At Risk (02/10/2024)   Humiliation, Afraid, Rape, and Kick questionnaire    Fear of Current or Ex-Partner: No    Emotionally Abused: No    Physically Abused: No    Sexually Abused: No    FAMILY HISTORY: His  family history includes Colon polyps in his father; Coronary artery disease in his father, maternal grandfather, and paternal grandfather; Diabetes in his father; Hypertension in his father; Lung cancer in his father; Other in his mother.  CURRENT MEDICATIONS   Current Outpatient Medications  Medication Instructions   acetaminophen  (TYLENOL ) 500 mg, Every 6 hours PRN   aspirin  81 mg, Daily at bedtime   atorvastatin  (LIPITOR) 10 mg, Oral, Daily   clopidogrel  (PLAVIX ) 75 mg, Oral, Daily   metoprolol  succinate (TOPROL -XL) 50 MG 24 hr tablet TAKE 1  TABLET BY MOUTH EVERY DAY WITH OR IMMEDIATELY FOLLOWING A MEAL   valsartan  (DIOVAN ) 160 MG tablet TAKE ONE (1) TABLET BY MOUTH (160 MG) DAILY     ALLERGIES  He has no known allergies.  REVIEW OF SYSTEMS:  Review of Systems - Oncology   Rest of the pertinent review of systems is unremarkable except as mentioned above in HPI.  PHYSICAL EXAMINATION:    Onc Performance Status - 02/10/24 1008       ECOG Perf Status   ECOG Perf Status Restricted in physically strenuous activity but ambulatory and able to carry out work of a light or sedentary nature, e.g., light house work, office work      KPS SCALE   KPS % SCORE Normal activity with effort, some s/s of disease          Vitals:   02/10/24 0955  BP: 136/89  Pulse: (!) 57  Resp: 17  Temp: (!) 97.2 F (36.2 C)  SpO2: 100%   Filed Weights   02/10/24 0955  Weight: (!) 319 lb (144.7 kg)    Physical Exam Constitutional:      General: He is not in acute distress.    Appearance: Normal appearance.  HENT:     Head: Normocephalic and atraumatic.  Eyes:     Conjunctiva/sclera: Conjunctivae normal.  Cardiovascular:     Rate and Rhythm: Normal rate and regular rhythm.  Pulmonary:     Effort: Pulmonary effort is normal. No respiratory distress.  Abdominal:     General: There is no distension.  Neurological:     General: No focal deficit present.     Mental Status: He is alert  and oriented to person, place, and time.  Psychiatric:        Mood and Affect: Mood normal.        Behavior: Behavior normal.      LABORATORY DATA:   I have reviewed the data as listed.  Results for orders placed or performed in visit on 02/10/24  Lactate dehydrogenase  Result Value Ref Range   LDH 178 98 - 192 U/L  Ferritin  Result Value Ref Range   Ferritin 752 (H) 24 - 336 ng/mL  Iron and Iron Binding Capacity ([facility] only)  Result Value Ref Range   Iron 186 (H) 45 - 182 ug/dL   TIBC 729 749 - 549 ug/dL   Saturation Ratios 69 (H) 17.9 - 39.5 %   UIBC 84 (L) 117 - 376 ug/dL  CMP (Cancer Center only)  Result Value Ref Range   Sodium 138 135 - 145 mmol/L   Potassium 4.2 3.5 - 5.1 mmol/L   Chloride 104 98 - 111 mmol/L   CO2 29 22 - 32 mmol/L   Glucose, Bld 102 (H) 70 - 99 mg/dL   BUN 15 8 - 23 mg/dL   Creatinine 9.03 9.38 - 1.24 mg/dL   Calcium  9.2 8.9 - 10.3 mg/dL   Total Protein 7.2 6.5 - 8.1 g/dL   Albumin  4.0 3.5 - 5.0 g/dL   AST 48 (H) 15 - 41 U/L   ALT 52 (H) 0 - 44 U/L   Alkaline Phosphatase 139 (H) 38 - 126 U/L   Total Bilirubin 0.8 0.0 - 1.2 mg/dL   GFR, Estimated >39 >39 mL/min   Anion gap 5 5 - 15  CBC with Differential (Cancer Center Only)  Result Value Ref Range   WBC Count 3.9 (L) 4.0 - 10.5 K/uL   RBC 4.48 4.22 -  5.81 MIL/uL   Hemoglobin 15.3 13.0 - 17.0 g/dL   HCT 55.0 60.9 - 47.9 %   MCV 100.2 (H) 80.0 - 100.0 fL   MCH 34.2 (H) 26.0 - 34.0 pg   MCHC 34.1 30.0 - 36.0 g/dL   RDW 87.2 88.4 - 84.4 %   Platelet Count 96 (L) 150 - 400 K/uL   nRBC 0.0 0.0 - 0.2 %   Neutrophils Relative % 45 %   Neutro Abs 1.8 1.7 - 7.7 K/uL   Lymphocytes Relative 37 %   Lymphs Abs 1.4 0.7 - 4.0 K/uL   Monocytes Relative 8 %   Monocytes Absolute 0.3 0.1 - 1.0 K/uL   Eosinophils Relative 9 %   Eosinophils Absolute 0.4 0.0 - 0.5 K/uL   Basophils Relative 1 %   Basophils Absolute 0.0 0.0 - 0.1 K/uL   Immature Granulocytes 0 %   Abs Immature Granulocytes 0.01  0.00 - 0.07 K/uL     RADIOGRAPHIC STUDIES:  I have personally reviewed the radiological images as listed and agreed with the findings in the report.  US  ABDOMEN COMPLETE W/ELASTOGRAPHY Result Date: 01/15/2024 CLINICAL DATA:  Thrombocytopenia, elevated LFTs, fatty liver EXAM: ULTRASOUND ABDOMEN ULTRASOUND HEPATIC ELASTOGRAPHY TECHNIQUE: Sonography of the upper abdomen was performed. In addition, ultrasound elastography evaluation of the liver was performed. A region of interest was placed within the right lobe of the liver. Following application of a compressive sonographic pulse, tissue compressibility was assessed. Multiple assessments were performed at the selected site. Median tissue compressibility was determined. Previously, hepatic stiffness was assessed by shear wave velocity. Based on recently published Society of Radiologists in Ultrasound consensus article, reporting is now recommended to be performed in the SI units of pressure (kiloPascals) representing hepatic stiffness/elasticity. The obtained result is compared to the published reference standards. (cACLD = compensated Advanced Chronic Liver Disease) COMPARISON:  None Available. FINDINGS: ULTRASOUND ABDOMEN Gallbladder: No gallstones or wall thickening visualized. No sonographic Murphy sign noted by sonographer. Common bile duct: Diameter: 0.1 cm Liver: No focal lesion identified. Increased parenchymal echogenicity. Portal vein is patent on color Doppler imaging with normal direction of blood flow towards the liver. IVC: No abnormality visualized. Pancreas: Visualized portion unremarkable. Spleen: Size and appearance within normal limits. Right Kidney: Length: 11.3 cm. Echogenicity within normal limits. No mass or hydronephrosis visualized. Left Kidney: Length: 12.3 cm. Echogenicity within normal limits. No mass or hydronephrosis visualized. Abdominal aorta: No aneurysm visualized. Other findings: None. ULTRASOUND HEPATIC ELASTOGRAPHY Device:  Siemens Helix VTQ Patient position: Supine Transducer 9C2 Number of measurements: 12 Hepatic segment:  8 Median kPa: 28.7 IQR: 48.3 IQR/Median kPa ratio: 1.7 Data quality: IQR/Median kPa ratio of 0.3 or greater indicates reduced accuracy Diagnostic category: > or =17 kPa: highly suggestive of cACLD with an increased probability of clinically significant portal hypertension The use of hepatic elastography is applicable to patients with viral hepatitis and non-alcoholic fatty liver disease. At this time, there is insufficient data for the referenced cut-off values and use in other causes of liver disease, including alcoholic liver disease. Patients, however, may be assessed by elastography and serve as their own reference standard/baseline. In patients with non-alcoholic liver disease, the values suggesting compensated advanced chronic liver disease (cACLD) may be lower, and patients may need additional testing with elasticity results of 7-9 kPa. Please note that abnormal hepatic elasticity and shear wave velocities may also be identified in clinical settings other than with hepatic fibrosis, such as: acute hepatitis, elevated right heart and central venous pressures  including use of beta blockers, veno-occlusive disease (Budd-Chiari), infiltrative processes such as mastocytosis/amyloidosis/infiltrative tumor/lymphoma, extrahepatic cholestasis, with hyperemia in the post-prandial state, and with liver transplantation. Correlation with patient history, laboratory data, and clinical condition recommended. Diagnostic Categories: < or =5 kPa: high probability of being normal < or =9 kPa: in the absence of other known clinical signs, rules out cACLD >9 kPa and ?13 kPa: suggestive of cACLD, but needs further testing >13 kPa: highly suggestive of cACLD > or =17 kPa: highly suggestive of cACLD with an increased probability of clinically significant portal hypertension IMPRESSION: ULTRASOUND ABDOMEN: Hepatic steatosis.  ULTRASOUND HEPATIC ELASTOGRAPHY: Median kPa:  28.7 Diagnostic category: > or =17 kPa: highly suggestive of cACLD with an increased probability of clinically significant portal hypertension Electronically Signed   By: Marolyn JONETTA Jaksch M.D.   On: 01/15/2024 16:29     Orders Placed This Encounter  Procedures   CBC with Differential (Cancer Center Only)    Standing Status:   Future    Number of Occurrences:   1    Expiration Date:   02/09/2025   CMP (Cancer Center only)    Standing Status:   Future    Number of Occurrences:   1    Expiration Date:   02/09/2025   Iron and Iron Binding Capacity ([facility] only)    Standing Status:   Future    Number of Occurrences:   1    Expiration Date:   02/09/2025   Ferritin    Standing Status:   Future    Number of Occurrences:   1    Expiration Date:   02/09/2025   Lactate dehydrogenase    Standing Status:   Future    Number of Occurrences:   1    Expiration Date:   02/09/2025   Hemochromatosis DNA, PCR    Standing Status:   Future    Number of Occurrences:   1    Expiration Date:   02/09/2025    Future Appointments  Date Time Provider Department Center  02/15/2024  7:00 AM Cirigliano, Vito V, DO [facility]  02/17/2024 12:45 PM Zaina Jenkin, Chinita, MD [facility] None  03/09/2024  8:15 AM [facility] LAB [facility] None  03/09/2024  8:45 AM Kayly Kriegel, MD [facility] None  07/10/2024  8:00 AM [facility] ANNUAL WELLNESS VISIT 1 [facility] [place]    I spent a total of 55 minutes during this encounter with the patient including review of chart and various tests results, discussions about plan of care and coordination of care plan.  This document was completed utilizing speech recognition software. Grammatical errors, random word insertions, pronoun errors, and incomplete sentences are an occasional consequence of this system due to software limitations, ambient noise, and hardware issues. Any formal questions or concerns about the content, text  or information contained within the body of this dictation should be directly addressed to the provider for clarification.

## 2024-02-15 ENCOUNTER — Ambulatory Visit: Admitting: Gastroenterology

## 2024-02-15 ENCOUNTER — Encounter: Payer: Self-pay | Admitting: Gastroenterology

## 2024-02-15 VITALS — BP 103/48 | HR 78 | Temp 97.8°F | Resp 14 | Ht 72.0 in | Wt 319.0 lb

## 2024-02-15 DIAGNOSIS — K2270 Barrett's esophagus without dysplasia: Secondary | ICD-10-CM

## 2024-02-15 DIAGNOSIS — Z13810 Encounter for screening for upper gastrointestinal disorder: Secondary | ICD-10-CM

## 2024-02-15 DIAGNOSIS — Z860101 Personal history of adenomatous and serrated colon polyps: Secondary | ICD-10-CM

## 2024-02-15 DIAGNOSIS — Z1211 Encounter for screening for malignant neoplasm of colon: Secondary | ICD-10-CM | POA: Diagnosis not present

## 2024-02-15 DIAGNOSIS — R7989 Other specified abnormal findings of blood chemistry: Secondary | ICD-10-CM

## 2024-02-15 DIAGNOSIS — K2100 Gastro-esophageal reflux disease with esophagitis, without bleeding: Secondary | ICD-10-CM

## 2024-02-15 DIAGNOSIS — K2970 Gastritis, unspecified, without bleeding: Secondary | ICD-10-CM | POA: Diagnosis not present

## 2024-02-15 DIAGNOSIS — K648 Other hemorrhoids: Secondary | ICD-10-CM | POA: Diagnosis not present

## 2024-02-15 DIAGNOSIS — K641 Second degree hemorrhoids: Secondary | ICD-10-CM

## 2024-02-15 DIAGNOSIS — K5730 Diverticulosis of large intestine without perforation or abscess without bleeding: Secondary | ICD-10-CM | POA: Diagnosis not present

## 2024-02-15 DIAGNOSIS — E785 Hyperlipidemia, unspecified: Secondary | ICD-10-CM | POA: Diagnosis not present

## 2024-02-15 DIAGNOSIS — K5520 Angiodysplasia of colon without hemorrhage: Secondary | ICD-10-CM

## 2024-02-15 DIAGNOSIS — K222 Esophageal obstruction: Secondary | ICD-10-CM

## 2024-02-15 DIAGNOSIS — I10 Essential (primary) hypertension: Secondary | ICD-10-CM | POA: Diagnosis not present

## 2024-02-15 DIAGNOSIS — D124 Benign neoplasm of descending colon: Secondary | ICD-10-CM | POA: Diagnosis not present

## 2024-02-15 DIAGNOSIS — F32A Depression, unspecified: Secondary | ICD-10-CM | POA: Diagnosis not present

## 2024-02-15 DIAGNOSIS — R1310 Dysphagia, unspecified: Secondary | ICD-10-CM | POA: Diagnosis not present

## 2024-02-15 DIAGNOSIS — I2510 Atherosclerotic heart disease of native coronary artery without angina pectoris: Secondary | ICD-10-CM | POA: Diagnosis not present

## 2024-02-15 MED ORDER — PANTOPRAZOLE SODIUM 40 MG PO TBEC: 40.0000 mg | DELAYED_RELEASE_TABLET | Freq: Two times a day (BID) | ORAL | 3 refills | Status: AC

## 2024-02-15 MED ORDER — SODIUM CHLORIDE 0.9 % IV SOLN: 500.0000 mL | Freq: Once | INTRAVENOUS | Status: DC

## 2024-02-15 NOTE — Progress Notes (Signed)
 GASTROENTEROLOGY PROCEDURE H&P NOTE   Primary Care Physician: Kennyth Worth HERO, MD    Reason for Procedure:  Colon polyp surveillance, GERD, dysphagia  Plan:    EGD, colonoscopy  Patient is appropriate for endoscopic procedure(s) in the ambulatory ([facility]) setting.  The nature of the procedure, as well as the risks, benefits, and alternatives were carefully and thoroughly reviewed with the patient. Ample time for discussion and questions allowed. The patient understood, was satisfied, and agreed to proceed.     HPI: Alfred [name] is a 73 y.o. male who presents for colonoscopy for ongoing colon polyp surveillance and colon cancer screening.  No active GI symptoms.  No known family history of colon cancer or related malignancy.  Patient is otherwise without complaints or active issues today.  Last colonoscopy was 09/2021 and notable for 6 subcentimeter adenomas, left-sided diverticulosis, internal hemorrhoids, with recommendation to repeat in 3 years for surveillance.  Additionally, longstanding history of GERD with increased reflux symptoms and dyspepsia recently. Does have intermittent solid food dysphagia, mainly with rice.   Has been holding his Plavix  for 5 days for procedures today.  Past Medical History:  Diagnosis Date   AAA (abdominal aortic aneurysm) (HCC) 08/2011   see Surgical history   Anginal pain (HCC)    Arthritis    knees; not bad   CAD (coronary artery disease)    DES to RCA and CFX; Dr. Wonda cardiologist   Depression ~ 2009   'treated for ~ 6 months   Dyslipidemia    HTN (hypertension)    unspecified essential   Hx of adenomatous colonic polyps 2008   Hyperplasia of prostate without lower urinary tract symptoms (LUTS)    Left lumbar radiculopathy    Other and unspecified hyperlipidemia    Stroke [facility])     Past Surgical History:  Procedure Laterality Date   ABDOMINAL AORTIC ANEURYSM REPAIR  09/06/2011   ABDOMINAL AORTIC ANEURYSM REPAIR  01/21/2012    Procedure: ANEURYSM ABDOMINAL AORTIC REPAIR;  Surgeon: Carlin FORBES Haddock, MD;  Location: [facility] OR;  Service: Vascular;  Laterality: N/A;  Aortio- bi-iliac repair  using Cryolife descending thoracic aorta 14mm x10.5 cm.   APPENDECTOMY  1970   ARTERY REPAIR  09/06/2011   Procedure: ARTERY REPAIR;  Surgeon: Carlin FORBES Haddock, MD;  Location: [facility] OR;  Service: Vascular;  Laterality: Bilateral;   COLONOSCOPY W/ POLYPECTOMY  2008   adenomatous  & hyperplastic polyp;due 2013   CORONARY ANGIOPLASTY WITH STENT PLACEMENT  2002; 2005   2 +1; total of 3   CYSTOSCOPY  2005   varicose veins in bladder, Dr matilda   TONSILLECTOMY AND ADENOIDECTOMY  1954   VASECTOMY      Prior to Admission medications   Medication Sig Start Date End Date Taking? Authorizing Provider  acetaminophen  (TYLENOL ) 500 MG tablet Take 500 mg by mouth every 6 (six) hours as needed for mild pain. PRN for pain/fever 01/28/12  Yes Roczniak, Angeline PARAS, PA-C  aspirin  81 MG tablet Take 81 mg by mouth at bedtime.   Yes [provider]  atorvastatin  (LIPITOR) 10 MG tablet TAKE 1 TABLET BY MOUTH EVERY DAY 07/01/23  Yes Wonda Sharper, MD  metoprolol  succinate (TOPROL -XL) 50 MG 24 hr tablet TAKE 1 TABLET BY MOUTH EVERY DAY WITH OR IMMEDIATELY FOLLOWING A MEAL 07/01/23  Yes Wonda Sharper, MD  valsartan  (DIOVAN ) 160 MG tablet TAKE ONE (1) TABLET BY MOUTH (160 MG) DAILY 07/01/23  Yes Wonda Sharper, MD  clopidogrel  (PLAVIX ) 75 MG  tablet TAKE 1 TABLET BY MOUTH EVERY DAY 07/01/23   Wonda Sharper, MD    Current Outpatient Medications  Medication Sig Dispense Refill   acetaminophen  (TYLENOL ) 500 MG tablet Take 500 mg by mouth every 6 (six) hours as needed for mild pain. PRN for pain/fever     aspirin  81 MG tablet Take 81 mg by mouth at bedtime.     atorvastatin  (LIPITOR) 10 MG tablet TAKE 1 TABLET BY MOUTH EVERY DAY 90 tablet 3   metoprolol  succinate (TOPROL -XL) 50 MG 24 hr tablet TAKE 1 TABLET BY MOUTH EVERY DAY WITH OR IMMEDIATELY  FOLLOWING A MEAL 90 tablet 3   valsartan  (DIOVAN ) 160 MG tablet TAKE ONE (1) TABLET BY MOUTH (160 MG) DAILY 90 tablet 3   clopidogrel  (PLAVIX ) 75 MG tablet TAKE 1 TABLET BY MOUTH EVERY DAY 90 tablet 3   Current Facility-Administered Medications  Medication Dose Route Frequency Provider Last Rate Last Admin   0.9 %  sodium chloride  infusion  500 mL Intravenous Once Vermelle Cammarata V, DO        Allergies as of 02/15/2024   (No Known Allergies)    Family History  Problem Relation Age of Onset   Other Mother        varicose veins   Coronary artery disease Father        CABG in 74s   Diabetes Father    Colon polyps Father    Lung cancer Father        non-small cell carcinoma/bone CA   Hypertension Father    Coronary artery disease Maternal Grandfather    Coronary artery disease Paternal Grandfather    Anesthesia problems Neg Hx    Colon cancer Neg Hx    Esophageal cancer Neg Hx    Liver disease Neg Hx     Social History   Socioeconomic History   Marital status: Divorced    Spouse name: Not on file   Number of children: 4   Years of education: Not on file   Highest education level: Not on file  Occupational History   Occupation: Farming  Tobacco Use   Smoking status: Former    Current packs/day: 0.00    Average packs/day: 1 pack/day for 27.0 years (27.0 ttl pk-yrs)    Types: Cigarettes    Start date: 05/31/1966    Quit date: 05/31/1993    Years since quitting: 30.7   Smokeless tobacco: Current    Types: Snuff   Tobacco comments:    smoked 1968-1995, up to 1 ppd. Currently using Skoal 1 can / day  Vaping Use   Vaping status: Never Used  Substance and Sexual Activity   Alcohol use: Yes    Alcohol/week: 21.0 standard drinks of alcohol    Types: 21 Shots of liquor per week    Comment: 2-3 drinks of Vodka a night, occasional beer   Drug use: No   Sexual activity: Yes  Other Topics Concern   Not on file  Social History Narrative   Not on file   Social Drivers of  Health   Financial Resource Strain: Low Risk  (07/05/2023)   Overall Financial Resource Strain (CARDIA)    Difficulty of Paying Living Expenses: Not hard at all  Food Insecurity: No Food Insecurity (02/10/2024)   Hunger Vital Sign    Worried About Running Out of Food in the Last Year: Never true    Ran Out of Food in the Last Year: Never true  Transportation Needs: No Transportation  Needs (02/10/2024)   PRAPARE - Administrator, Civil Service (Medical): No    Lack of Transportation (Non-Medical): No  Physical Activity: Inactive (07/05/2023)   Exercise Vital Sign    Days of Exercise per Week: 0 days    Minutes of Exercise per Session: 0 min  Stress: No Stress Concern Present (07/05/2023)   [employer] of Occupational Health - Occupational Stress Questionnaire    Feeling of Stress : Not at all  Social Connections: Moderately Isolated (07/05/2023)   Social Connection and Isolation Panel    Frequency of Communication with Friends and Family: More than three times a week    Frequency of Social Gatherings with Friends and Family: More than three times a week    Attends Religious Services: 1 to 4 times per year    Active Member of [employer] or Organizations: No    Attends Banker Meetings: Never    Marital Status: Divorced  Catering manager Violence: Not At Risk (02/10/2024)   Humiliation, Afraid, Rape, and Kick questionnaire    Fear of Current or Ex-Partner: No    Emotionally Abused: No    Physically Abused: No    Sexually Abused: No    Physical Exam: Vital signs in last 24 hours: @BP  (!) 143/94   Pulse 62   Temp 97.8 F (36.6 C)   Ht 6' (1.829 m)   Wt (!) 319 lb (144.7 kg)   SpO2 94%   BMI 43.26 kg/m  GEN: NAD EYE: Sclerae anicteric ENT: MMM CV: Non-tachycardic Pulm: CTA b/l GI: Soft, NT/ND NEURO:  Alert & Oriented x 3   Sandor Flatter, DO [facility]   02/15/2024 7:52 AM

## 2024-02-15 NOTE — Patient Instructions (Addendum)
 Restart Plavix  (clopidogrel ) at prior dose tomorrow.  -Handout on Polyps, diverticulosis, hemorrhoids, gastritis and esophagitis provided. -await pathology results. -repeat colonoscopy for surveillance recommended. Date to be determined when pathology result become available.  -Continue present medications.Start Protonix  ( Pantoprazole ) 40 mg twice a day for 4 weeks, then decrease to 40 mg daily.  -Soft diet today. Handout provided   YOU HAD AN ENDOSCOPIC PROCEDURE TODAY AT THE [facility]:   Refer to the procedure report that was given to you for any specific questions about what was found during the examination.  If the procedure report does not answer your questions, please call your gastroenterologist to clarify.  If you requested that your care partner not be given the details of your procedure findings, then the procedure report has been included in a sealed envelope for you to review at your convenience later.  YOU SHOULD EXPECT: Some feelings of bloating in the abdomen. Passage of more gas than usual.  Walking can help get rid of the air that was put into your GI tract during the procedure and reduce the bloating. If you had a lower endoscopy (such as a colonoscopy or flexible sigmoidoscopy) you may notice spotting of blood in your stool or on the toilet paper. If you underwent a bowel prep for your procedure, you may not have a normal bowel movement for a few days.  Please Note:  You might notice some irritation and congestion in your nose or some drainage.  This is from the oxygen used during your procedure.  There is no need for concern and it should clear up in a day or so.  SYMPTOMS TO REPORT IMMEDIATELY:  Following lower endoscopy (colonoscopy or flexible sigmoidoscopy):  Excessive amounts of blood in the stool  Significant tenderness or worsening of abdominal pains  Swelling of the abdomen that is new, acute  Fever of 100F or higher  Following upper endoscopy  (EGD)  Vomiting of blood or coffee ground material  New chest pain or pain under the shoulder blades  Painful or persistently difficult swallowing  New shortness of breath  Fever of 100F or higher  Black, tarry-looking stools  For urgent or emergent issues, a gastroenterologist can be reached at any hour by calling [phone]. Do not use MyChart messaging for urgent concerns.    DIET:  We do recommend a small meal at first, but then you may proceed to  a soft diet today, see handout,tomorrow proceeded to your regular diet.  Drink plenty of fluids but you should avoid alcoholic beverages for 24 hours.  ACTIVITY:  You should plan to take it easy for the rest of today and you should NOT DRIVE or use heavy machinery until tomorrow (because of the sedation medicines used during the test).    FOLLOW UP: Our staff will call the number listed on your records the next business day following your procedure.  We will call around 7:15- 8:00 am to check on you and address any questions or concerns that you may have regarding the information given to you following your procedure. If we do not reach you, we will leave a message.     If any biopsies were taken you will be contacted by phone or by letter within the next 1-3 weeks.  Please call us  at [phone] if you have not heard about the biopsies in 3 weeks.    SIGNATURES/CONFIDENTIALITY: You and/or your care partner have signed paperwork which will be entered into your electronic  medical record.  These signatures attest to the fact that that the information above on your After Visit Summary has been reviewed and is understood.  Full responsibility of the confidentiality of this discharge information lies with you and/or your care-partner.

## 2024-02-15 NOTE — Progress Notes (Signed)
 Pt's states no medical or surgical changes since previsit or office visit.

## 2024-02-15 NOTE — Progress Notes (Signed)
 Called to room to assist during endoscopic procedure.  Patient ID and intended procedure confirmed with present staff. Received instructions for my participation in the procedure from the performing physician.

## 2024-02-15 NOTE — Op Note (Signed)
 [facility] Patient Name: Alfred [name] Procedure Date: 02/15/2024 7:47 AM MRN: [id number] Endoscopist: Sandor Flatter , MD, [id number] Age: 73 Referring MD:  Date of Birth: 09-Nov-1950 Gender: Male Account #: [id number] Procedure:                Upper GI endoscopy Indications:              Epigastric abdominal pain, Dysphagia, Esophageal                            reflux, Screening for Barrett's esophagus, Dyspepsia Medicines:                Monitored Anesthesia Care Procedure:                Pre-Anesthesia Assessment:                           - Prior to the procedure, a History and Physical                            was performed, and patient medications and                            allergies were reviewed. The patient's tolerance of                            previous anesthesia was also reviewed. The risks                            and benefits of the procedure and the sedation                            options and risks were discussed with the patient.                            All questions were answered, and informed consent                            was obtained. Prior Anticoagulants: The patient has                            taken Plavix  (clopidogrel ), last dose was 5 days                            prior to procedure. ASA Grade Assessment: III - A                            patient with severe systemic disease. After                            reviewing the risks and benefits, the patient was                            deemed in satisfactory condition to undergo the  procedure.                           After obtaining informed consent, the endoscope was                            passed under direct vision. Throughout the                            procedure, the patient's blood pressure, pulse, and                            oxygen saturations were monitored continuously. The                            Olympus Scope SN [id number]  was introduced through the                            mouth, and advanced to the second part of duodenum.                            The upper GI endoscopy was accomplished without                            difficulty. The patient tolerated the procedure                            well. Scope In: Scope Out: Findings:                 One benign-appearing, intrinsic mild stenosis was                            found 39 cm from the incisors. This stenosis                            measured 1 cm (in length). The stenosis was                            traversed. A TTS dilator was passed through the                            scope. Dilation with an 18-19-20 mm balloon dilator                            was performed to 19 mm. The dilation site was                            examined and showed mild mucosal disruption.                            Estimated blood loss was minimal.                           One tongue of salmon-colored mucosa  was present at                            40 cm. No other visible abnormalities were present.                            The maximum longitudinal extent of these esophageal                            mucosal changes was 0.5 cm in length. Biopsies were                            taken with a cold forceps for histology. Estimated                            blood loss was minimal.                           LA Grade A (one or more mucosal breaks less than 5                            mm, not extending between tops of 2 mucosal folds)                            esophagitis with no bleeding was found 40 cm from                            the incisors.                           The gastroesophageal flap valve was visualized                            endoscopically and classified as Hill Grade III                            (minimal fold, loose to endoscope, hiatal hernia                            likely).                           Diffuse mild inflammation  characterized by                            congestion (edema) and erythema was found in the                            gastric fundus, in the gastric body, at the                            incisura and in the gastric antrum. Biopsies were  taken with a cold forceps for Helicobacter pylori                            testing. Estimated blood loss was minimal.                           The examined duodenum was normal. Complications:            No immediate complications. Estimated Blood Loss:     Estimated blood loss was minimal. Impression:               - Benign-appearing esophageal stenosis. Dilated                            with 19 mm TTS balloon.                           - Salmon-colored mucosa. Biopsied.                           - LA Grade A reflux esophagitis with no bleeding.                           - Gastroesophageal flap valve classified as Hill                            Grade III (minimal fold, loose to endoscope, hiatal                            hernia likely).                           - Gastritis. Biopsied.                           - Normal examined duodenum. Recommendation:           - Patient has a contact number available for                            emergencies. The signs and symptoms of potential                            delayed complications were discussed with the                            patient. Return to normal activities tomorrow.                            Written discharge instructions were provided to the                            patient.                           - Soft diet today.                           -  Continue present medications.                           - Await pathology results.                           - Repeat upper endoscopy PRN for retreatment.                           - Resume Plavix  (clopidogrel ) at prior dose                            tomorrow.                           - Use Protonix   (pantoprazole ) 40 mg PO BID for 4                            weeks to promote mucosal healing, then reduce to 40                            mg daily for ongoing control of reflux. Sandor Flatter, MD 02/15/2024 9:01:13 AM

## 2024-02-15 NOTE — Progress Notes (Signed)
 Vss nad trans to pacu

## 2024-02-15 NOTE — Op Note (Signed)
 [facility] Patient Name: Alfred [name] Procedure Date: 02/15/2024 7:47 AM MRN: [id number] Endoscopist: Sandor Flatter , MD, [id number] Age: 73 Referring MD:  Date of Birth: [birth year] Gender: Male Account #: [id number] Procedure:                Colonoscopy Indications:              Surveillance: Personal history of adenomatous                            polyps on last colonoscopy 3 years ago                           Last colonoscopy was 09/2021 and notable for 6                            subcentimeter adenomas, left-sided diverticulosis,                            internal hemorrhoids, with recommendation to repeat                            in 3 years for surveillance. Medicines:                Monitored Anesthesia Care Procedure:                Pre-Anesthesia Assessment:                           - Prior to the procedure, a History and Physical                            was performed, and patient medications and                            allergies were reviewed. The patient's tolerance of                            previous anesthesia was also reviewed. The risks                            and benefits of the procedure and the sedation                            options and risks were discussed with the patient.                            All questions were answered, and informed consent                            was obtained. Prior Anticoagulants: The patient has                            taken Plavix  (clopidogrel ), last dose was 5 days  prior to procedure. ASA Grade Assessment: III - A                            patient with severe systemic disease. After                            reviewing the risks and benefits, the patient was                            deemed in satisfactory condition to undergo the                            procedure.                           After obtaining informed consent, the colonoscope                             was passed under direct vision. Throughout the                            procedure, the patient's blood pressure, pulse, and                            oxygen saturations were monitored continuously. The                            Olympus Scope SN: [id number] was introduced through                            the anus and advanced to the the terminal ileum.                            The colonoscopy was performed without difficulty.                            The patient tolerated the procedure well. The                            quality of the bowel preparation was good. The                            terminal ileum, ileocecal valve, appendiceal                            orifice, and rectum were photographed. Scope In: 8:28:10 AM Scope Out: 8:43:48 AM Scope Withdrawal Time: 0 hours 12 minutes 46 seconds  Total Procedure Duration: 0 hours 15 minutes 38 seconds  Findings:                 The perianal and digital rectal examinations were                            normal.  Two sessile polyps were found in the descending                            colon. The polyps were 3 to 4 mm in size. These                            polyps were removed with a cold snare. Resection                            and retrieval were complete. Estimated blood loss                            was minimal.                           A few small-mouthed diverticula were found in the                            sigmoid colon.                           A single small angioectasia with typical                            arborization was found in the ascending colon.                           Non-bleeding internal hemorrhoids were found during                            retroflexion. The hemorrhoids were small.                           The terminal ileum appeared normal. Complications:            No immediate complications. Estimated Blood Loss:     Estimated blood loss was  minimal. Impression:               - Two 3 to 4 mm polyps in the descending colon,                            removed with a cold snare. Resected and retrieved.                           - Diverticulosis in the sigmoid colon.                           - A single colonic angioectasia.                           - Non-bleeding internal hemorrhoids.                           - The examined portion of the ileum was normal. Recommendation:           - Patient has a  contact number available for                            emergencies. The signs and symptoms of potential                            delayed complications were discussed with the                            patient. Return to normal activities tomorrow.                            Written discharge instructions were provided to the                            patient.                           - Resume previous diet.                           - Continue present medications.                           - Await pathology results.                           - Will follow-up on pathology results, but given                            findings on this study, age, and chronic                            comorbidities, can likely forego repeat colonoscopy                            from a screening/polyp surveillance standpoint.                           - Return to GI office PRN.                           - Resume Plavix  (clopidogrel ) at prior dose                            tomorrow. Sandor Flatter, MD 02/15/2024 8:52:29 AM

## 2024-02-16 ENCOUNTER — Telehealth: Payer: Self-pay | Admitting: Lactation Services

## 2024-02-16 ENCOUNTER — Telehealth: Payer: Self-pay

## 2024-02-16 NOTE — Telephone Encounter (Signed)
 Patient called to ask about his request for a referral for a sleep study. He says he has missed the last 2 appointments that were scheduled due to work, so he really needs to have this rescheduled. Advised I will send this to the office.    Copied from CRM [id number]. Topic: Referral - Question >> Feb 16, 2024  8:17 AM Cleave MATSU wrote: Reason for CRM: pt wants a referral to get a sleep study done please call pt back and assist

## 2024-02-16 NOTE — Telephone Encounter (Signed)
  Follow up Call-     02/15/2024    7:31 AM  Call back number  Post procedure Call Back phone  # [phone]  Permission to leave phone message Yes     Patient questions:  Do you have a fever, pain , or abdominal swelling? No. Pain Score  0 *  Have you tolerated food without any problems? Yes.    Have you been able to return to your normal activities? Yes.    Do you have any questions about your discharge instructions: Diet   No. Medications  No. Follow up visit  No.  Do you have questions or concerns about your Care? No.  Actions: * If pain score is 4 or above: No action needed, pain <4.

## 2024-02-16 NOTE — Telephone Encounter (Signed)
 Need OV last OV with Dr Kennyth on 01/2023

## 2024-02-17 ENCOUNTER — Inpatient Hospital Stay: Admitting: Oncology

## 2024-02-17 ENCOUNTER — Encounter: Payer: Self-pay | Admitting: Oncology

## 2024-02-17 NOTE — Assessment & Plan Note (Addendum)
 Elevated ferritin at 540, increased iron at 191, and iron saturation at 70% suggest iron overload.   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  These findings raised concerns for hemochromatosis, leading to the referral.  On his consultation with us  on 02/10/2024, labs continued to show elevated ferritin of 752, iron saturation increased at 16%, iron increased at 186.     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  He is scheduled to begin therapeutic phlebotomies from 02/20/2024.  Initially we will plan for every 2-week phlebotomies and eventually space out.   I will see him as scheduled in October 2025. - Provide general dietary information on iron-rich foods at the next appointment.

## 2024-02-17 NOTE — Progress Notes (Signed)
 [facility]  HEMATOLOGY-ONCOLOGY ELECTRONIC VISIT PROGRESS NOTE  PATIENT NAME: Alfred [name]   MR#: [id number] DOB: Oct 10, 1950  DATE OF SERVICE: 02/17/2024  Patient Care Team: Kennyth Worth HERO, MD as PCP - General (Family Medicine) Wonda Sharper, MD as PCP - Cardiology (Cardiology) Pandora Cadet, [facility] as Pharmacist (Pharmacist) Craig Alan SAUNDERS, PA-C (Gastroenterology)  I connected with the patient via telephone conference and verified that I am speaking with the correct person using two identifiers. The patient's location is at home and I am providing care from the [facility].  I discussed the limitations, risks, security and privacy concerns of performing an evaluation and management service by e-visits and the availability of in person appointments. I also discussed with the patient that there may be a patient responsible charge related to this service. The patient expressed understanding and agreed to proceed.   ASSESSMENT & PLAN:   Alfred [name] is a 73 y.o.  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  These findings raised concerns for hemochromatosis, leading to the referral.  On his consultation with us  on 02/10/2024, labs continued to show elevated ferritin of 752, iron saturation increased at 16%, iron increased at 186.     Given heterozygosity for C282Y and H63D mutations, goal is to maintain ferritin below 200 with  therapeutic phlebotomies as needed.  He is scheduled to begin therapeutic phlebotomies from 02/20/2024.  Initially we will plan for every 2-week phlebotomies and eventually space out.   I will see him as scheduled in October 2025. - Provide general dietary information on iron-rich foods at the next appointment.   I discussed the assessment and treatment plan with the patient. The patient was provided an opportunity to ask questions and all were answered. The patient agreed with the plan and demonstrated an understanding of the instructions. The patient was advised to call back or seek an in-person evaluation if the symptoms worsen or if the condition fails to improve as anticipated.    I spent 12 minutes over the phone with the patient reviewing test results, discuss management and coordination/planning of care.  Chinita Patten, MD 02/17/2024 3:07 PM [facility] - A DEPT OF [facility] [address] [place] [address] Dept: [phone] Dept Fax: [phone]   INTERVAL HISTORY:  Please see above for problem oriented charting.  The purpose of today's discussion is to explain recent lab results and to formulate plan of care.  Discussed the use of AI scribe software for clinical note transcription with the patient, who gave verbal consent to proceed.  History of Present Illness Alfred [name] is a 73 year old male with hemochromatosis who presents for follow-up regarding his iron levels and phlebotomy schedule.  He has been undergoing workup for elevated ferritin levels, which were noted to be 752 ng/mL, approximately twice the normal level. His iron saturation was found to be 69%. Genetic testing revealed two mutations  associated with hemochromatosis, with one gene abnormal for each mutation.  He is scheduled for phlebotomy on Monday.  He reports no symptoms related to his condition. He has not been taking iron  supplements and is mindful of his dietary iron intake, avoiding high iron foods such as liver and consuming red meat infrequently.      SUMMARY OF HEMATOLOGY HISTORY:  He was referred by his gastroenterologist for further evaluation of elevated iron levels and possible hemochromatosis.   Recent laboratory tests from January 10, 2024, revealed elevated ferritin at 540, increased iron at 191, and iron saturation at 70%. Liver function tests showed AST at 54, ALT at 56, and alkaline phosphatase mildly elevated at 143, with normal total bilirubin at 0.9.   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  These findings raised concerns for hemochromatosis, leading to the referral.   He has a history of chronic intermittent thrombocytopenia since 2013, with platelet counts ranging from 120,000 to 200,000. He notes that his red blood cells are larger than normal, with recent measurements showing a size of 101, slightly above the normal range of 80 to 100.   He has been followed in the GI clinic for adenomatous polyps, first diagnosed in 2019, and has been undergoing regular colonoscopies for surveillance. He was noted to have elevated liver function tests during these evaluations.   He consumes one to two drinks of vodka nightly for the last six years, which he has recently reduced to one shot per day over the past three to four weeks.   He mentions a significant amount of stress due to a recent divorce finalized in January after 41 years of marriage, which he believes may be affecting his health. He describes himself as having internalized stress over the years.  On his consultation with us  on 02/10/2024, labs continued to show elevated ferritin of 752, iron saturation increased at 16%, iron increased at 186.     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  REVIEW OF  SYSTEMS:    Review of Systems - Oncology  All other pertinent systems were reviewed with the patient and are negative.  I have reviewed the past medical history, past surgical history, social history and family history with the patient and they are unchanged from previous note.  ALLERGIES:  He has no known allergies.  MEDICATIONS:  Current Outpatient Medications  Medication Sig Dispense Refill   acetaminophen  (TYLENOL ) 500 MG tablet Take 500 mg by mouth every 6 (six) hours as needed for mild pain. PRN for pain/fever     aspirin  81 MG tablet Take 81 mg by mouth at bedtime.     atorvastatin  (LIPITOR) 10 MG tablet TAKE 1 TABLET BY MOUTH EVERY DAY 90 tablet 3   clopidogrel  (PLAVIX ) 75 MG tablet TAKE 1 TABLET BY MOUTH EVERY DAY 90 tablet 3   metoprolol  succinate (TOPROL -XL) 50 MG 24 hr tablet TAKE 1 TABLET BY MOUTH EVERY DAY WITH OR IMMEDIATELY FOLLOWING A MEAL 90 tablet 3   pantoprazole  (PROTONIX ) 40 MG tablet Take 1 tablet (40 mg total) by mouth 2 (two) times daily. Take 40 mg twice a day for 4 week and then decrease to 40 mg daily 180 tablet 3   valsartan  (DIOVAN ) 160 MG tablet TAKE ONE (1) TABLET BY MOUTH (160 MG) DAILY 90 tablet 3   No current facility-administered medications for this visit.  PHYSICAL EXAMINATION:   Onc Performance Status - 02/17/24 1200       ECOG Perf Status   ECOG Perf Status Restricted in physically strenuous activity but ambulatory and able to carry out work of a light or sedentary nature, e.g., light house work, office work      KPS SCALE   KPS % SCORE Able to carry on normal activity, minor s/s of disease          LABORATORY DATA:   I have reviewed the data as listed.  Recent Results (from the past 2160 hours)  Myocardial Perfusion Imaging     Status: None   Collection Time: 12/26/23 10:36 AM  Result Value Ref Range   Rest Nuclear Isotope Dose 12.0 mCi   Rest HR 57.0 bpm   Rest BP 139/70 mmHg   Peak HR 75 bpm   Peak BP 151/67 mmHg    Stress Nuclear Isotope Dose 32.4 mCi   SSS 8.0    SRS 17.0    SDS 3.0    TID 1.14    LV sys vol 47.0 4.2 - 5.8 mL   LV dias vol 130.0 62 - 150 mL   Nuc Stress EF 64 %   Base ST Depression (mm) 0 mm   ST Depression (mm) 0 mm  Hepatitis C antibody     Status: None   Collection Time: 01/10/24 10:42 AM  Result Value Ref Range   Hepatitis C Ab NON-REACTIVE NON-REACTIVE    Comment: . HCV antibody was non-reactive. There is no laboratory  evidence of HCV infection. . In most cases, no further action is required. However, if recent HCV exposure is suspected, a test for HCV RNA (test code 64354) is suggested. . For additional information please refer to http://education.[facility].com/faq/FAQ22v1 (This link is being provided for informational/ educational purposes only.) .   Hepatitis B surface antibody,qualitative     Status: None   Collection Time: 01/10/24 10:42 AM  Result Value Ref Range   Hep B S Ab NON-REACTIVE NON-REACTIVE  Hepatitis B surface antigen     Status: None   Collection Time: 01/10/24 10:42 AM  Result Value Ref Range   Hepatitis B Surface Ag NON-REACTIVE NON-REACTIVE    Comment: . For additional information, please refer to  http://education.[facility].com/faq/FAQ202  (This link is being provided for informational/ educational purposes only.) .   Hepatitis A antibody, total     Status: Abnormal   Collection Time: 01/10/24 10:42 AM  Result Value Ref Range   Hepatitis A AB,Total REACTIVE (A) NON-REACTIVE    Comment: . For additional information, please refer to  http://education.[facility].com/faq/FAQ202  (This link is being provided for informational/ educational purposes only.) .   Mitochondrial antibodies     Status: None   Collection Time: 01/10/24 10:42 AM  Result Value Ref Range   Mitochondrial M2 Ab, IgG <=20.0 <=20.0 U    Comment: . Reference Range:    Negative:  <=20.0      U    Equivocal: 20.1 - 24.9 U    Positive:   >=25.0      U .   Anti-smooth muscle antibody, IgG     Status: None   Collection Time: 01/10/24 10:42 AM  Result Value Ref Range   Actin (Smooth Muscle) Antibody (IGG) <20 <20 U    Comment: . Reference Range:    <20 U: Negative >or=20 U: Positive . [name] Antibodies recognizing actin are the main component of smooth muscle antibodies associated with auto- immune  liver disease. Actin antibodies are found in approximately 75% of patients with autoimmune hepatitis (AIH) type 1, approximately 65% of patients with autoimmune cholangitis, approximately 30% of patients with primary biliary cirrhosis and approximately 2% of healthy controls. High values are closely correlated with AIH type 1. .   ANA     Status: Abnormal   Collection Time: 01/10/24 10:42 AM  Result Value Ref Range   Anti Nuclear Antibody (ANA) POSITIVE (A) NEGATIVE    Comment: ANA IFA is a first line screen for detecting the presence of up to approximately 150 autoantibodies in various autoimmune diseases. A positive ANA IFA result is suggestive of autoimmune disease and reflexes to titer and pattern. Further laboratory testing may be considered if clinically indicated. . For additional information, please refer to http://education.[facility].com/faq/FAQ177 (This link is being provided for informational/ educational purposes only.) .   IgG     Status: None   Collection Time: 01/10/24 10:42 AM  Result Value Ref Range   IgG (Immunoglobin G), Serum 1,236 600 - 1,540 mg/dL  IBC + Ferritin     Status: Abnormal   Collection Time: 01/10/24 10:42 AM  Result Value Ref Range   Iron 191 (H) 42 - 165 ug/dL   Transferrin 804.9 (L) 212.0 - 360.0 mg/dL   Saturation Ratios 29.9 (H) 20.0 - 50.0 %   Ferritin 539.5 (H) 22.0 - 322.0 ng/mL   TIBC 273.0 250.0 - 450.0 mcg/dL  IgA     Status: Abnormal   Collection Time: 01/10/24 10:42 AM  Result Value Ref Range   Immunoglobulin A 378 (H) 70 - 320 mg/dL  Tissue  transglutaminase, IgA     Status: None   Collection Time: 01/10/24 10:42 AM  Result Value Ref Range   (tTG) Ab, IgA <1.0 U/mL    Comment: Value          Interpretation -----          -------------- <15.0          Antibody not detected > or = 15.0    Antibody detected .   Protime-INR     Status: Abnormal   Collection Time: 01/10/24 10:42 AM  Result Value Ref Range   INR 1.2 (H) 0.8 - 1.0 ratio   Prothrombin Time 12.2 9.6 - 13.1 sec  Gamma GT     Status: Abnormal   Collection Time: 01/10/24 10:42 AM  Result Value Ref Range   GGT 288 (H) 7 - 51 U/L  Basic metabolic panel with GFR     Status: Abnormal   Collection Time: 01/10/24 10:42 AM  Result Value Ref Range   Sodium 138 135 - 145 mEq/L   Potassium 4.1 3.5 - 5.1 mEq/L   Chloride 103 96 - 112 mEq/L   CO2 27 19 - 32 mEq/L   Glucose, Bld 108 (H) 70 - 99 mg/dL   BUN 20 6 - 23 mg/dL   Creatinine, Ser 9.17 0.40 - 1.50 mg/dL   GFR 12.41 >39.99 mL/min    Comment: Calculated using the CKD-EPI Creatinine Equation (2021)   Calcium  9.2 8.4 - 10.5 mg/dL  Hepatic function panel     Status: Abnormal   Collection Time: 01/10/24 10:42 AM  Result Value Ref Range   Total Bilirubin 0.9 0.2 - 1.2 mg/dL   Bilirubin, Direct 0.2 0.0 - 0.3 mg/dL   Alkaline Phosphatase 143 (H) 39 - 117 U/L   AST 54 (H) 0 - 37 U/L   ALT 56 (H) 0 - 53 U/L  Total Protein 7.2 6.0 - 8.3 g/dL   Albumin  3.9 3.5 - 5.2 g/dL  CBC with Differential/Platelet     Status: Abnormal   Collection Time: 01/10/24 10:42 AM  Result Value Ref Range   WBC 3.4 (L) 4.0 - 10.5 K/uL   RBC 4.49 4.22 - 5.81 Mil/uL   Hemoglobin 15.4 13.0 - 17.0 g/dL   HCT 54.6 60.9 - 47.9 %   MCV 101.0 (H) 78.0 - 100.0 fl   MCHC 34.0 30.0 - 36.0 g/dL   RDW 85.8 88.4 - 84.4 %   Platelets 104.0 (L) 150.0 - 400.0 K/uL   Neutrophils Relative % 43.3 43.0 - 77.0 %   Lymphocytes Relative 37.2 12.0 - 46.0 %   Monocytes Relative 10.4 3.0 - 12.0 %   Eosinophils Relative 8.0 (H) 0.0 - 5.0 %   Basophils  Relative 1.1 0.0 - 3.0 %   Neutro Abs 1.5 1.4 - 7.7 K/uL   Lymphs Abs 1.3 0.7 - 4.0 K/uL   Monocytes Absolute 0.4 0.1 - 1.0 K/uL   Eosinophils Absolute 0.3 0.0 - 0.7 K/uL   Basophils Absolute 0.0 0.0 - 0.1 K/uL  Anti-nuclear ab-titer (ANA titer)     Status: Abnormal   Collection Time: 01/10/24 10:42 AM  Result Value Ref Range   ANA Titer 1 1:40 (H) titer    Comment: A low level ANA titer may be present in pre-clinical autoimmune diseases and normal individuals.                 Reference Range                 <1:40        Negative                 1:40-1:80    Low Antibody Level                 >1:80        Elevated Antibody Level .    ANA Pattern 1 Cytoplasmic (A)     Comment: The presence of cytoplasmic fluorescence was noted on the HEp-2 slide. Other reactivities (e.g., anti- mitochondrial antibodies or anti-smooth muscle antibodies) may be responsible for this fluorescence. The clinical significance of this finding is uncertain. Clinical correlation is recommended. . AC-15 to AC-23: Cytoplasmic . International Consensus on ANA Patterns ([email or web address])    ANA TITER 1:80 (H) titer    Comment: A low level ANA titer may be present in pre-clinical autoimmune diseases and normal individuals.                 Reference Range                 <1:40        Negative                 1:40-1:80    Low Antibody Level                 >1:80        Elevated Antibody Level .    ANA PATTERN Nuclear, Speckled (A)     Comment: Speckled pattern is associated with mixed connective tissue disease (MCTD), systemic lupus erythematosus (SLE), Sjogren's syndrome, dermatomyositis, and  systemic sclerosis/polymyositis overlap. . AC-2,4,5,29: Speckled . International Consensus on ANA Patterns ([email or web address])   Hemochromatosis DNA-PCR(c282y,h63d)     Status: None   Collection Time: 01/12/24  9:03 AM  Result Value Ref Range   DNA MUTATION  ANALYSIS  See Below     Comment: RESULT: HETEROZYGOUS FOR THE C282Y AND H63D PATHOGENIC VARIANTS . Interpretation: One copy each of the C282Y and H63D pathogenic variants in HFE gene was detected. Approximately 3%-8% of individuals with a biochemical diagnosis of hereditary hemochromatosis (HH) have this genotype. Therefore, this result is consistent with a diagnosis of HH in an individual with clinical evidence of HH. However, this genotype does not predict a diagnosis of HH in an asymptomatic individual, as only 0.5% to 2% of individuals with this genotype will develop symptoms or clinical evidence of this disorder. Disease diagnosis can only be made by demonstration of elevated iron stores. Genetic counseling is recommended to discuss the potential clinical implications of this result. . . Laboratory testing supervised and results monitored by Johnie Elbe, Ph.D., [facility], [facility], CGMB. . . . DETAILED ASSAY INFORMATION: Hereditary hemochromatosis (HH) is an autosomal recessive disorder of iron  metabolism that can result in iron overload and potential organ failure. It is one of the most common genetic disorders in individuals of European-Caucasian ancestry, with an estimated carrier frequency of 10%. HH is caused by pathogenic variants in the HFE gene. Most individuals with HH (60-90%) are homozygous for the C282Y pathogenic variant. A smaller percentage of affected individuals are either compound heterozygous for the C282Y and H63D pathogenic variants (3%-8%), or homozygous for the H63D pathogenic variant (approximately 1%). . METHODOLOGY: This assay detects two pathogenic variants in the HFE gene, C282Y (NM 999589.7: c.845G>A, p.Cys282Tyr) and H63D (NM 999589.7: c.187C>G, p.His63Asp), that are commonly associated with HH. These variants are detected by multiplex-polymerase chain reaction (PCR) amplification, followed by restriction enzyme digestion and  capillary electrophoresis. [name] LIMITATIONS: This assay does not detect other pathogenic variants in the  HFE gene that may be associated with HH. Although rare, false positive or false negative results may occur. All results should be interpreted in the context of clinical findings, relevant history, and other laboratory data. . Health care providers, please contact your local [facility]' genetic counselor or call [phone] ([phone]) for assistance with the interpretation of these results. . This test was developed and its analytical performance characteristics have been determined by [facility]. It has not been cleared or approved by FDA. This assay has been validated pursuant to the CLIA regulations and is used for clinical purposes. . For more information, please refer to http://education.[facility].com/faq/hemochromatosis. (This link is being provided for informational/educational purposes only.) . A portion of the testing was performed at [facility]. Reviewed and signed by Laboratory testing sup ervised and results monitored by Johnie Elbe, Ph.D., [facility], [facility], CGMB, Signed on 01/25/2024 at 17:24   Iron and Iron Binding Capacity ([facility] only)     Status: Abnormal   Collection Time: 02/10/24 11:04 AM  Result Value Ref Range   Iron 186 (H) 45 - 182 ug/dL   TIBC 729 749 - 549 ug/dL   Saturation Ratios 69 (H) 17.9 - 39.5 %   UIBC 84 (L) 117 - 376 ug/dL    Comment: Performed at [facility], [address], [place], [place] [address]  CMP (Cancer Center only)     Status: Abnormal   Collection Time: 02/10/24 11:04 AM  Result Value Ref Range   Sodium 138 135 - 145 mmol/L   Potassium 4.2 3.5 - 5.1 mmol/L   Chloride 104 98 - 111 mmol/L   CO2 29 22 - 32 mmol/L   Glucose, Bld 102 (H) 70 - 99 mg/dL  Comment: Glucose reference range applies only to samples taken after fasting for at  least 8 hours.   BUN 15 8 - 23 mg/dL   Creatinine 9.03 9.38 - 1.24 mg/dL   Calcium  9.2 8.9 - 10.3 mg/dL   Total Protein 7.2 6.5 - 8.1 g/dL   Albumin  4.0 3.5 - 5.0 g/dL   AST 48 (H) 15 - 41 U/L   ALT 52 (H) 0 - 44 U/L   Alkaline Phosphatase 139 (H) 38 - 126 U/L   Total Bilirubin 0.8 0.0 - 1.2 mg/dL   GFR, Estimated >39 >39 mL/min    Comment: (NOTE) Calculated using the CKD-EPI Creatinine Equation (2021)    Anion gap 5 5 - 15    Comment: Performed at [facility], [address], [place], [place] [address]  CBC with Differential (Cancer Center Only)     Status: Abnormal   Collection Time: 02/10/24 11:04 AM  Result Value Ref Range   WBC Count 3.9 (L) 4.0 - 10.5 K/uL   RBC 4.48 4.22 - 5.81 MIL/uL   Hemoglobin 15.3 13.0 - 17.0 g/dL   HCT 55.0 60.9 - 47.9 %   MCV 100.2 (H) 80.0 - 100.0 fL   MCH 34.2 (H) 26.0 - 34.0 pg   MCHC 34.1 30.0 - 36.0 g/dL   RDW 87.2 88.4 - 84.4 %   Platelet Count 96 (L) 150 - 400 K/uL   nRBC 0.0 0.0 - 0.2 %   Neutrophils Relative % 45 %   Neutro Abs 1.8 1.7 - 7.7 K/uL   Lymphocytes Relative 37 %   Lymphs Abs 1.4 0.7 - 4.0 K/uL   Monocytes Relative 8 %   Monocytes Absolute 0.3 0.1 - 1.0 K/uL   Eosinophils Relative 9 %   Eosinophils Absolute 0.4 0.0 - 0.5 K/uL   Basophils Relative 1 %   Basophils Absolute 0.0 0.0 - 0.1 K/uL   Immature Granulocytes 0 %   Abs Immature Granulocytes 0.01 0.00 - 0.07 K/uL    Comment: Performed at [facility], [address] [place], [place] [address]  Lactate dehydrogenase     Status: None   Collection Time: 02/10/24 11:05 AM  Result Value Ref Range   LDH 178 98 - 192 U/L    Comment: Performed at [facility], [address], [place], [place] [address]  Ferritin     Status: Abnormal   Collection Time: 02/10/24 11:05 AM  Result Value Ref Range   Ferritin 752 (H) 24 - 336 ng/mL    Comment: Performed at [facility], [address], [place], [place] [address]     RADIOGRAPHIC STUDIES:  No recent pertinent imaging studies available to review.  Orders Placed This Encounter  Procedures   Ferritin    Standing Status:   Standing    Number of Occurrences:   6    Expiration Date:   02/16/2025   Hemoglobin and Hematocrit (Cancer Center Only)    Standing Status:   Standing    Number of Occurrences:   6    Expiration Date:   02/16/2025   CBC with Differential (Cancer Center Only)    Standing Status:   Future    Expected Date:   03/09/2024    Expiration Date:   06/07/2024   CMP (Cancer Center only)    Standing Status:   Future    Expected Date:   03/09/2024    Expiration Date:   06/07/2024  Iron and Iron Binding Capacity ([facility] only)    Standing Status:   Future    Expected Date:   03/09/2024    Expiration Date:   06/07/2024   Ferritin    Standing Status:   Future    Expected Date:   03/09/2024    Expiration Date:   06/07/2024     Future Appointments  Date Time Provider Department Center  02/20/2024 11:00 AM Kennyth Worth HERO, MD [facility] [place]  02/20/2024  3:00 PM [facility] INFUSION [facility] None  03/05/2024  3:00 PM [facility] INFUSION [facility] None  03/09/2024  8:15 AM [facility] LAB [facility] None  03/09/2024  8:45 AM Ellee Wawrzyniak, MD [facility] None  07/10/2024  8:00 AM [facility] ANNUAL WELLNESS VISIT 1 [facility] [place]    This document was completed utilizing Engineer, civil (consulting). Grammatical errors, random word insertions, pronoun errors, and incomplete sentences are an occasional consequence of this system due to software limitations, ambient noise, and hardware issues. Any formal questions or concerns about the content, text or information contained within the body of this dictation should be directly addressed to the provider for clarification.

## 2024-02-20 ENCOUNTER — Encounter: Payer: Self-pay | Admitting: Family Medicine

## 2024-02-20 ENCOUNTER — Inpatient Hospital Stay

## 2024-02-20 ENCOUNTER — Ambulatory Visit (INDEPENDENT_AMBULATORY_CARE_PROVIDER_SITE_OTHER): Admitting: Family Medicine

## 2024-02-20 VITALS — BP 149/77 | HR 56 | Temp 97.6°F | Ht 72.0 in | Wt 315.8 lb

## 2024-02-20 DIAGNOSIS — R739 Hyperglycemia, unspecified: Secondary | ICD-10-CM | POA: Diagnosis not present

## 2024-02-20 DIAGNOSIS — Z23 Encounter for immunization: Secondary | ICD-10-CM

## 2024-02-20 DIAGNOSIS — Z125 Encounter for screening for malignant neoplasm of prostate: Secondary | ICD-10-CM | POA: Diagnosis not present

## 2024-02-20 DIAGNOSIS — I10 Essential (primary) hypertension: Secondary | ICD-10-CM

## 2024-02-20 DIAGNOSIS — E785 Hyperlipidemia, unspecified: Secondary | ICD-10-CM

## 2024-02-20 DIAGNOSIS — G4730 Sleep apnea, unspecified: Secondary | ICD-10-CM

## 2024-02-20 LAB — LIPID PANEL
Cholesterol: 129 mg/dL (ref 0–200)
HDL: 39.6 mg/dL (ref >39.00)
LDL Cholesterol: 64 mg/dL (ref 0–99)
NonHDL: 89.14
Total CHOL/HDL Ratio: 3
Triglycerides: 124 mg/dL (ref 0.0–149.0)
VLDL: 24.8 mg/dL (ref 0.0–40.0)

## 2024-02-20 LAB — HEMOGLOBIN A1C: Hgb A1c MFr Bld: 5.9 % (ref 4.6–6.5)

## 2024-02-20 LAB — PSA: PSA: 0.13 ng/mL (ref 0.10–4.00)

## 2024-02-20 LAB — SURGICAL PATHOLOGY

## 2024-02-20 NOTE — Assessment & Plan Note (Signed)
Check A1c with labs. 

## 2024-02-20 NOTE — Patient Instructions (Addendum)
 It was very nice to see you today!  VISIT SUMMARY: During your visit, we discussed your ongoing management of hereditary hemochromatosis, your experience with muscle spasms near your eye, and your need for a sleep study referral. We also reviewed your general health maintenance and planned for routine blood work and a flu vaccination.  YOUR PLAN: HEREDITARY HEMOCHROMATOSIS: You have chronic hereditary hemochromatosis, which is being managed with regular phlebotomy treatments. -Continue therapeutic phlebotomy every two weeks for three sessions. -Re-evaluate iron levels after the phlebotomy sessions. -Consider blood donation as part of your management. -Monitor for symptoms of liver and kidney iron overload.  BLEPHAROSPASM: You experience intermittent muscle spasms near your eye, which you can alleviate by rubbing. -Monitor your symptoms. -Consider Botox treatment if the spasms worsen.  GENERAL HEALTH MAINTENANCE: We discussed routine health maintenance, including vaccinations and blood work. -Get a flu vaccination. -Complete blood work for cholesterol, A1c, and PSA.  FOLLOW-UP: You need a referral for a sleep study due to persistent sleepiness and low energy. -A referral for a sleep study has been placed. -Schedule a follow-up appointment for next year unless issues arise.  No follow-ups on file.   Take care, Dr Kennyth  PLEASE NOTE:  If you had any lab tests, please let us  know if you have not heard back within a few days. You may see your results on mychart before we have a chance to review them but we will give you a call once they are reviewed by us .   If we ordered any referrals today, please let us  know if you have not heard from their office within the next week.   If you had any urgent prescriptions sent in today, please check with the pharmacy within an hour of our visit to make sure the prescription was transmitted appropriately.   Please try these tips to maintain a  healthy lifestyle:  Eat at least 3 REAL meals and 1-2 snacks per day.  Aim for no more than 5 hours between eating.  If you eat breakfast, please do so within one hour of getting up.   Each meal should contain half fruits/vegetables, one quarter protein, and one quarter carbs (no bigger than a computer mouse)  Cut down on sweet beverages. This includes juice, soda, and sweet tea.   Drink at least 1 glass of water with each meal and aim for at least 8 glasses per day  Exercise at least 150 minutes every week.

## 2024-02-20 NOTE — Assessment & Plan Note (Signed)
 Patient was referred for sleep study last year however was not able to schedule due to work conflicts.  Will place another referral today.

## 2024-02-20 NOTE — Progress Notes (Signed)
 Alfred [name] presents today for phlebotomy per MD orders. Phlebotomy procedure started using phlebotomy kit to LAC, beginning at 1516 and ending at 1525. 513 grams removed. Patient observed for 30 minutes after procedure without any incident. Patient tolerated procedure well. IV needle removed intact.

## 2024-02-20 NOTE — Progress Notes (Signed)
   Alfred [name] is a 73 y.o. male who presents today for an office visit.  Assessment/Plan:   Assessment & Plan Hereditary hemochromatosis   Chronic hereditary hemochromatosis is managed by specialists with ongoing therapeutic phlebotomy. He is reassured about the safety of blood donation and potential iron overload effects on the liver and kidneys were discussed. Continue therapeutic phlebotomy every two weeks for three sessions and re-evaluate iron levels post-phlebotomy. Consider blood donation as part of management.[name]  Blepharospasm   He experiences intermittent blepharospasm with identified potential triggers. Botox is considered if symptoms worsen. Monitor symptoms and consider Botox if blepharospasm worsens.   Chronic Problems Addressed Today: Hyperlipidemia On Lipitor 10 mg daily.  Check labs.  Hyperglycemia Check A1c with labs.  Sleep-disordered breathing Patient was referred for sleep study last year however was not able to schedule due to work conflicts.  Will place another referral today.  Essential hypertension Mildly elevated today.  Typically is well-controlled.  He is on metoprolol  succinate 50 mg daily and valsartan  160 mg daily per cardiology.  He will continue to monitor at home and let us  know if persistently elevated.  Flu vaccine given today    Subjective:  HPI:  See assessment / plan for status of chronic conditions.   Discussed the use of AI scribe software for clinical note transcription with the patient, who gave verbal consent to proceed.  History of Present Illness Alfred [name] is a 73 year old male with hemochromatosis who presents for follow-up and referral for a sleep study.  He is currently managed by a gastroenterologist and an oncologist for his hemochromatosis, undergoing phlebotomy treatments every two weeks for three sessions to control his iron levels. He has experienced fatigue and low energy for about a year. He also notes a decrease  in his white and red blood cell counts.  He is seeking a referral for a sleep study due to persistent sleepiness and low energy, which he feels is not typical for him. Previous attempts to schedule a sleep study were interrupted by other commitments, causing the referral to expire.  He describes experiencing a muscle spasm near his eye for about a year, which he can alleviate by rubbing. He attributes this to stress rather than caffeine, as he only consumes one cup of coffee in the morning.  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 He lives alone but frequently has family visiting, which he sometimes finds overwhelming.  He has a history of squamous cell carcinoma that was treated years ago and sees a dermatologist every year or two for skin checks. He has also reduced his alcohol consumption, which he believes is beneficial for his health.         Objective:  Physical Exam: BP (!) 149/77   Pulse (!) 56   Temp 97.6 F (36.4 C) (Temporal)   Ht 6' (1.829 m)   Wt (!) 315 lb 12.8 oz (143.2 kg)   SpO2 97%   BMI 42.83 kg/m   Gen: No acute distress, resting comfortably CV: Regular rate and rhythm with no murmurs appreciated Pulm: Normal work of breathing, clear to auscultation bilaterally with no crackles, wheezes, or rhonchi Neuro: Grossly normal, moves all extremities Psych: Normal affect and thought content      Alfred [name] M. Kennyth, MD 02/20/2024 11:59 AM

## 2024-02-20 NOTE — Assessment & Plan Note (Signed)
 Mildly elevated today.  Typically is well-controlled.  He is on metoprolol  succinate 50 mg daily and valsartan  160 mg daily per cardiology.  He will continue to monitor at home and let us  know if persistently elevated.

## 2024-02-20 NOTE — Assessment & Plan Note (Signed)
On Lipitor 10 mg daily.  Check labs.

## 2024-02-21 ENCOUNTER — Ambulatory Visit: Payer: Self-pay | Admitting: Family Medicine

## 2024-02-21 LAB — HEMOCHROMATOSIS DNA-PCR(C282Y,H63D)

## 2024-02-21 NOTE — Progress Notes (Signed)
 A1c is up a little bit at 5.9.  This is in the prediabetic range.  Do not need to start meds for this but he should continue to work on diet and exercise and we can recheck in a year.  His cholesterol levels are at goal.  His PSA is normal.  We can recheck everything in a year.

## 2024-02-23 ENCOUNTER — Ambulatory Visit: Payer: Self-pay | Admitting: Gastroenterology

## 2024-02-23 ENCOUNTER — Telehealth: Payer: Self-pay | Admitting: Gastroenterology

## 2024-02-23 NOTE — Telephone Encounter (Signed)
 Received a call from patient stating he is returning a call he received earlier, Please review and advise.   Thank you

## 2024-02-23 NOTE — Telephone Encounter (Signed)
 Returned call to patient and answered questions regarding pathology reports from colonoscopy and endoscopy.  Patient agreed to plan and verbalized understanding. No further questions.

## 2024-03-05 ENCOUNTER — Inpatient Hospital Stay

## 2024-03-05 ENCOUNTER — Inpatient Hospital Stay: Attending: Oncology

## 2024-03-05 LAB — HEMOGLOBIN AND HEMATOCRIT (CANCER CENTER ONLY)
HCT: 42.5 % (ref 39.0–52.0)
Hemoglobin: 14.3 g/dL (ref 13.0–17.0)

## 2024-03-05 LAB — FERRITIN: Ferritin: 578 ng/mL — ABNORMAL HIGH (ref 24–336)

## 2024-03-05 NOTE — Patient Instructions (Signed)

## 2024-03-05 NOTE — Progress Notes (Signed)
 Alfred [name] presents today for phlebotomy per MD orders. Phlebotomy procedure started at 1516 and ended at 1530. 517 grams removed via 16G LAC Patient observed for 25 minutes after procedure without any incident. Patient tolerated procedure well. IV needle removed intact.

## 2024-03-09 ENCOUNTER — Inpatient Hospital Stay (HOSPITAL_BASED_OUTPATIENT_CLINIC_OR_DEPARTMENT_OTHER): Admitting: Oncology

## 2024-03-09 ENCOUNTER — Inpatient Hospital Stay

## 2024-03-09 DIAGNOSIS — D696 Thrombocytopenia, unspecified: Secondary | ICD-10-CM | POA: Diagnosis not present

## 2024-03-09 LAB — CMP (CANCER CENTER ONLY)
ALT: 42 U/L (ref 0–44)
AST: 48 U/L — ABNORMAL HIGH (ref 15–41)
Albumin: 3.8 g/dL (ref 3.5–5.0)
Alkaline Phosphatase: 123 U/L (ref 38–126)
Anion gap: 4 — ABNORMAL LOW (ref 5–15)
BUN: 18 mg/dL (ref 8–23)
CO2: 28 mmol/L (ref 22–32)
Calcium: 9.4 mg/dL (ref 8.9–10.3)
Chloride: 108 mmol/L (ref 98–111)
Creatinine: 1.03 mg/dL (ref 0.61–1.24)
GFR, Estimated: >60 mL/min (ref >60)
Glucose, Bld: 106 mg/dL — ABNORMAL HIGH (ref 70–99)
Potassium: 4.4 mmol/L (ref 3.5–5.1)
Sodium: 140 mmol/L (ref 135–145)
Total Bilirubin: 0.7 mg/dL (ref 0.0–1.2)
Total Protein: 6.8 g/dL (ref 6.5–8.1)

## 2024-03-09 LAB — CBC WITH DIFFERENTIAL (CANCER CENTER ONLY)
Abs Immature Granulocytes: 0.01 K/uL (ref 0.00–0.07)
Basophils Absolute: 0 K/uL (ref 0.0–0.1)
Basophils Relative: 1 %
Eosinophils Absolute: 0.3 K/uL (ref 0.0–0.5)
Eosinophils Relative: 7 %
HCT: 40.5 % (ref 39.0–52.0)
Hemoglobin: 14.1 g/dL (ref 13.0–17.0)
Immature Granulocytes: 0 %
Lymphocytes Relative: 36 %
Lymphs Abs: 1.3 K/uL (ref 0.7–4.0)
MCH: 34.1 pg — ABNORMAL HIGH (ref 26.0–34.0)
MCHC: 34.8 g/dL (ref 30.0–36.0)
MCV: 98.1 fL (ref 80.0–100.0)
Monocytes Absolute: 0.3 K/uL (ref 0.1–1.0)
Monocytes Relative: 8 %
Neutro Abs: 1.7 K/uL (ref 1.7–7.7)
Neutrophils Relative %: 48 %
Platelet Count: 105 K/uL — ABNORMAL LOW (ref 150–400)
RBC: 4.13 MIL/uL — ABNORMAL LOW (ref 4.22–5.81)
RDW: 13.1 % (ref 11.5–15.5)
WBC Count: 3.5 K/uL — ABNORMAL LOW (ref 4.0–10.5)
nRBC: 0 % (ref 0.0–0.2)

## 2024-03-09 LAB — IRON AND IRON BINDING CAPACITY (CC-WL,HP ONLY)
Iron: 119 ug/dL (ref 45–182)
Saturation Ratios: 42 % — ABNORMAL HIGH (ref 17.9–39.5)
TIBC: 281 ug/dL (ref 250–450)
UIBC: 162 ug/dL (ref 117–376)

## 2024-03-09 LAB — FERRITIN: Ferritin: 653 ng/mL — ABNORMAL HIGH (ref 24–336)

## 2024-03-09 NOTE — Assessment & Plan Note (Addendum)
 Chronic intermittent thrombocytopenia with platelet counts ranging from 120,000 to 200,000 since 2013. Leukopenia with white blood cell counts fluctuating between 2,900 and 4,000. Macrocytosis with MCV 101-104 since 2022. Alcohol use may contribute to cytopenias.  Thrombocytopenia with slightly low platelet and white blood cell counts since 2023. Alcohol intake reduction may positively impact these counts. The condition is being monitored in conjunction with the management of hereditary hemochromatosis.  - Monitor blood counts to assess for improvement with reduced alcohol intake.

## 2024-03-09 NOTE — Progress Notes (Signed)
 [facility]  HEMATOLOGY CLINIC PROGRESS NOTE  PATIENT NAME: Alfred [name]   MR#: [id number] DOB: 04/17/1951  Patient Care Team: Kennyth Worth HERO, MD as PCP - General (Family Medicine) Wonda Sharper, MD as PCP - Cardiology (Cardiology) Pandora Cadet, [facility] as Pharmacist (Pharmacist) Craig Alan SAUNDERS, PA-C (Gastroenterology)  Date of visit: 03/09/2024   ASSESSMENT & PLAN:   Alfred [name] is a 73 y.o. 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  These findings raised concerns for hemochromatosis, leading to the referral.  On his consultation with us  on 02/10/2024, labs continued to show elevated ferritin of 752, iron saturation increased at 16%, iron increased at 186.     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  He began therapeutic phlebotomies from 02/20/2024.  Initially we will plan for every 2-week phlebotomies and eventually space out.   Current ferritin level is 578, with a goal to reduce it to below 200. The condition is not full-blown hemochromatosis, but a carrier state, which requires ongoing management. Stress and sleep apnea may contribute to elevated ferritin levels but do not cause the genetic mutation.   Arrange phlebotomy every four weeks to manage ferritin levels. - Schedule labs and phlebotomy on the same day for the next appointment,  then transition to labs one day prior and phlebotomy the next day. - Monitor hemoglobin levels to ensure he does not drop too low during phlebotomy. - Plan to reduce phlebotomy frequency as ferritin levels approach the goal. - Educate about the hereditary nature of the condition and potential implications for offspring.  Thrombocytopenia Chronic intermittent thrombocytopenia with platelet counts ranging from 120,000 to 200,000 since 2013. Leukopenia with white blood cell counts fluctuating between 2,900 and 4,000. Macrocytosis with MCV 101-104 since 2022. Alcohol use may contribute to cytopenias.  Thrombocytopenia with slightly low platelet and white blood cell counts since 2023. Alcohol intake reduction may positively impact these counts. The condition is being monitored in conjunction with the management of hereditary hemochromatosis.  - Monitor blood counts to assess for improvement with reduced alcohol intake.  Alcohol use Consuming 1-2 drinks of vodka nightly for six years, recently reduced to one shot per day. Impact on liver function and blood counts discussed. - Encourage reduction or cessation of alcohol intake.   I spent a total of 30 minutes during this encounter with the patient including review of chart and various tests results, discussions about plan of care and coordination of care plan.  I reviewed lab results and outside records for this visit and discussed relevant results with the patient. Diagnosis, plan of care and treatment options were also discussed in detail with the patient. Opportunity provided to ask questions and answers provided to his apparent satisfaction. Provided instructions to call our clinic with any problems, questions or concerns prior to return visit. I recommended to continue follow-up with PCP and sub-specialists. He verbalized understanding and agreed with the  plan. No barriers to learning was detected.  Chinita Patten, MD  03/09/2024 8:44 AM  [facility] - A DEPT OF [facility] 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  INTERVAL HISTORY:  Discussed the use of AI scribe software for clinical note transcription with the patient, who gave verbal consent to proceed.  History of Present Illness Alfred [name] is a 73 year old male with compound heterozygosity for hemochromatosis who presents for follow-up regarding his phlebotomy treatment.  He has been undergoing phlebotomy treatments to manage his iron levels due to compound heterozygosity for hemochromatosis. Recent lab results showed a reduction in ferritin levels from over 700 to 578. Current lab results are pending. The goal is to reduce his ferritin levels to below 200 without causing his hemoglobin to drop too low.  He has a history of stress, including a divorce after 41 years of marriage, and is undergoing a sleep apnea test soon. He inquired about the impact of stress and sleep apnea on his condition, noting that stress and inflammation can elevate ferritin levels.  He has two abnormal mutations related to hemochromatosis, specifically C282Y and H63D, but does not have full-blown hemochromatosis. He is aware of the genetic implications and the potential for his offspring to inherit the condition.  He has reduced his alcohol intake to one shot per day, which he believes helps him manage stress. He acknowledges that his white and platelet counts have been slightly low since 2021 and 2023, respectively, and wonders if alcohol consumption has contributed to this.  He lives about 20 minutes away from the clinic and is willing to adjust his schedule for future phlebotomy appointments.    SUMMARY OF HEMATOLOGIC HISTORY:  He was referred by his gastroenterologist for further  evaluation of elevated iron levels and possible hemochromatosis.   Recent laboratory tests from January 10, 2024, revealed elevated ferritin at 540, increased iron at 191, and iron saturation at 70%. Liver function tests showed AST at 54, ALT at 56, and alkaline phosphatase mildly elevated at 143, with normal total bilirubin at 0.9.    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  These findings raised concerns for hemochromatosis, leading to the referral.   He has a history of chronic intermittent thrombocytopenia since 2013, with platelet counts ranging from 120,000 to 200,000. He notes that his red blood cells are larger than normal, with recent measurements showing a size of 101, slightly above the normal range of 80 to 100.   He has been followed in the GI clinic for adenomatous polyps, first diagnosed in 2019, and has been undergoing regular colonoscopies for surveillance. He was noted to have elevated liver function tests during these evaluations.   He consumes one to two drinks of vodka nightly for the last six years, which he has recently reduced to one shot per day over the past three to four weeks.   He mentions a significant amount of stress due to a recent divorce finalized in January after 41 years of marriage, which he believes may be affecting his health. He describes himself as having internalized stress over the years.   On his consultation with us  on 02/10/2024, labs continued to show elevated ferritin of 752, iron saturation increased at 16%, iron increased  at 186.     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  I have reviewed the past medical history, past surgical history, social history and family history with the patient and they are unchanged from previous note.  ALLERGIES: He has no known allergies.  MEDICATIONS:  Current Outpatient  Medications  Medication Sig Dispense Refill   acetaminophen  (TYLENOL ) 500 MG tablet Take 500 mg by mouth every 6 (six) hours as needed for mild pain. PRN for pain/fever     aspirin  81 MG tablet Take 81 mg by mouth at bedtime.     atorvastatin  (LIPITOR) 10 MG tablet TAKE 1 TABLET BY MOUTH EVERY DAY 90 tablet 3   clopidogrel  (PLAVIX ) 75 MG tablet TAKE 1 TABLET BY MOUTH EVERY DAY 90 tablet 3   metoprolol  succinate (TOPROL -XL) 50 MG 24 hr tablet TAKE 1 TABLET BY MOUTH EVERY DAY WITH OR IMMEDIATELY FOLLOWING A MEAL 90 tablet 3   pantoprazole  (PROTONIX ) 40 MG tablet Take 1 tablet (40 mg total) by mouth 2 (two) times daily. Take 40 mg twice a day for 4 week and then decrease to 40 mg daily 180 tablet 3   valsartan  (DIOVAN ) 160 MG tablet TAKE ONE (1) TABLET BY MOUTH (160 MG) DAILY 90 tablet 3   No current facility-administered medications for this visit.     REVIEW OF SYSTEMS:    Review of Systems - Oncology  All other pertinent systems were reviewed with the patient and are negative.  PHYSICAL EXAMINATION:   Onc Performance Status - 03/09/24 0835       ECOG Perf Status   ECOG Perf Status Restricted in physically strenuous activity but ambulatory and able to carry out work of a light or sedentary nature, e.g., light house work, office work      KPS SCALE   KPS % SCORE Normal, no compliants, no evidence of disease          Vitals:   03/09/24 0824  BP: 138/88  Pulse: 64  Resp: 17  Temp: 98 F (36.7 C)  SpO2: 97%   Filed Weights   03/09/24 0824  Weight: (!) 319 lb 6.4 oz (144.9 kg)    Physical Exam Constitutional:      General: He is not in acute distress.    Appearance: Normal appearance.  HENT:     Head: Normocephalic and atraumatic.  Eyes:     Conjunctiva/sclera: Conjunctivae normal.  Cardiovascular:     Rate and Rhythm: Normal rate and regular rhythm.     Heart sounds: Normal heart sounds.  Pulmonary:     Effort: Pulmonary effort is normal. No respiratory  distress.     Breath sounds: Normal breath sounds.  Abdominal:     General: There is no distension.  Neurological:     General: No focal deficit present.     Mental Status: He is alert and oriented to person, place, and time.  Psychiatric:        Mood and Affect: Mood normal.        Behavior: Behavior normal.     LABORATORY DATA:   I have reviewed the data as listed.  Results for orders placed or performed in visit on 03/09/24  CBC with Differential (Cancer Center Only)  Result Value Ref Range   WBC Count 3.5 (L) 4.0 - 10.5 K/uL   RBC 4.13 (L) 4.22 - 5.81 MIL/uL   Hemoglobin 14.1 13.0 - 17.0 g/dL  HCT 40.5 39.0 - 52.0 %   MCV 98.1 80.0 - 100.0 fL   MCH 34.1 (H) 26.0 - 34.0 pg   MCHC 34.8 30.0 - 36.0 g/dL   RDW 86.8 88.4 - 84.4 %   Platelet Count 105 (L) 150 - 400 K/uL   nRBC 0.0 0.0 - 0.2 %   Neutrophils Relative % 48 %   Neutro Abs 1.7 1.7 - 7.7 K/uL   Lymphocytes Relative 36 %   Lymphs Abs 1.3 0.7 - 4.0 K/uL   Monocytes Relative 8 %   Monocytes Absolute 0.3 0.1 - 1.0 K/uL   Eosinophils Relative 7 %   Eosinophils Absolute 0.3 0.0 - 0.5 K/uL   Basophils Relative 1 %   Basophils Absolute 0.0 0.0 - 0.1 K/uL   Immature Granulocytes 0 %   Abs Immature Granulocytes 0.01 0.00 - 0.07 K/uL  CMP (Cancer Center only)  Result Value Ref Range   Sodium 140 135 - 145 mmol/L   Potassium 4.4 3.5 - 5.1 mmol/L   Chloride 108 98 - 111 mmol/L   CO2 28 22 - 32 mmol/L   Glucose, Bld 106 (H) 70 - 99 mg/dL   BUN 18 8 - 23 mg/dL   Creatinine 8.96 9.38 - 1.24 mg/dL   Calcium  9.4 8.9 - 10.3 mg/dL   Total Protein 6.8 6.5 - 8.1 g/dL   Albumin  3.8 3.5 - 5.0 g/dL   AST 48 (H) 15 - 41 U/L   ALT 42 0 - 44 U/L   Alkaline Phosphatase 123 38 - 126 U/L   Total Bilirubin 0.7 0.0 - 1.2 mg/dL   GFR, Estimated >39 >39 mL/min   Anion gap 4 (L) 5 - 15  Iron and Iron Binding Capacity ([facility] only)  Result Value Ref Range   Iron 119 45 - 182 ug/dL   TIBC 718 749 - 549 ug/dL   Saturation Ratios  42 (H) 17.9 - 39.5 %   UIBC 162 117 - 376 ug/dL  Ferritin  Result Value Ref Range   Ferritin 653 (H) 24 - 336 ng/mL    RADIOGRAPHIC STUDIES:  No recent pertinent imaging studies available to review.  Orders Placed This Encounter  Procedures   Hemoglobin and Hematocrit (Cancer Center Only)    Standing Status:   Standing    Number of Occurrences:   6    Expiration Date:   03/12/2025   Ferritin    Standing Status:   Standing    Number of Occurrences:   6    Expiration Date:   03/12/2025   Iron and Iron Binding Capacity ([facility] only)    Standing Status:   Future    Expected Date:   07/13/2024    Expiration Date:   10/11/2024   CBC with Differential (Cancer Center Only)    Standing Status:   Future    Expected Date:   07/13/2024    Expiration Date:   10/11/2024   CMP (Cancer Center only)    Standing Status:   Future    Expected Date:   07/13/2024    Expiration Date:   10/11/2024   Ferritin    Standing Status:   Future    Expected Date:   07/13/2024    Expiration Date:   03/12/2025     Future Appointments  Date Time Provider Department Center  03/15/2024  9:15 AM Buck Saucer, MD [facility] None  03/28/2024  7:30 AM [facility] LAB [facility] None  03/29/2024  8:45 AM [facility] INFUSION [facility]  None  05/02/2024  7:45 AM [facility] LAB [facility] None  05/03/2024  8:45 AM [facility] INFUSION [facility] None  06/06/2024  7:30 AM [facility] LAB [facility] None  06/06/2024  8:45 AM [facility] INFUSION [facility] None  07/10/2024  8:00 AM [facility] ANNUAL WELLNESS VISIT 1 [facility] [place]  07/10/2024  9:30 AM [facility] LAB [facility] None  07/11/2024  8:30 AM Cledith Abdou, Chinita, MD [facility] None     This document was completed utilizing speech recognition software. Grammatical errors, random word insertions, pronoun errors, and incomplete sentences are an occasional consequence of this system due to software limitations, ambient noise, and hardware issues. Any  formal questions or concerns about the content, text or information contained within the body of this dictation should be directly addressed to the provider for clarification.

## 2024-03-09 NOTE — Assessment & Plan Note (Addendum)
 Elevated ferritin at 540, increased iron at 191, and iron saturation at 70% suggest iron overload.   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  These findings raised concerns for hemochromatosis, leading to the referral.  On his consultation with us  on 02/10/2024, labs continued to show elevated ferritin of 752, iron saturation increased at 16%, iron increased at 186.     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  He began therapeutic phlebotomies from 02/20/2024.  Initially we will plan for every 2-week phlebotomies and eventually space out.   Current ferritin level is 578, with a goal to reduce it to below 200. The condition is not full-blown hemochromatosis, but a carrier state, which requires ongoing management. Stress and sleep apnea may contribute to elevated ferritin levels but do not cause the genetic mutation.   Arrange phlebotomy every four weeks to manage ferritin levels. - Schedule labs and phlebotomy on the same day for the next appointment, then transition to labs one day prior and phlebotomy the next day. - Monitor hemoglobin levels to ensure he does not drop too low during phlebotomy. - Plan to reduce phlebotomy frequency as ferritin levels approach the goal. - Educate about the hereditary nature of the condition and potential implications for offspring.

## 2024-03-12 ENCOUNTER — Encounter: Payer: Self-pay | Admitting: Oncology

## 2024-03-15 ENCOUNTER — Ambulatory Visit: Admitting: Neurology

## 2024-03-15 ENCOUNTER — Encounter: Payer: Self-pay | Admitting: Neurology

## 2024-03-15 VITALS — BP 134/77 | HR 53 | Ht 72.0 in | Wt 320.6 lb

## 2024-03-15 DIAGNOSIS — R351 Nocturia: Secondary | ICD-10-CM | POA: Diagnosis not present

## 2024-03-15 DIAGNOSIS — R0681 Apnea, not elsewhere classified: Secondary | ICD-10-CM | POA: Diagnosis not present

## 2024-03-15 DIAGNOSIS — Z9189 Other specified personal risk factors, not elsewhere classified: Secondary | ICD-10-CM

## 2024-03-15 DIAGNOSIS — R0683 Snoring: Secondary | ICD-10-CM

## 2024-03-15 DIAGNOSIS — Z955 Presence of coronary angioplasty implant and graft: Secondary | ICD-10-CM

## 2024-03-15 DIAGNOSIS — G4719 Other hypersomnia: Secondary | ICD-10-CM | POA: Diagnosis not present

## 2024-03-15 DIAGNOSIS — I2510 Atherosclerotic heart disease of native coronary artery without angina pectoris: Secondary | ICD-10-CM | POA: Diagnosis not present

## 2024-03-15 NOTE — Patient Instructions (Signed)

## 2024-03-15 NOTE — Progress Notes (Signed)
 Subjective:    Patient ID: Alfred [name] is a 73 y.o. male.  HPI    Alfred Mar, MD, PhD [facility] [address] [address] [place], [place] [address]   Dear Dr. Kennyth,  I saw your patient, Alfred [name], upon your kind request in my sleep clinic today for initial consultation of his sleep disorder, in particular, concern for underlying obstructive sleep apnea.  The patient is unaccompanied today.  As you know, Alfred [name] is a 73 year old male with an underlying complex medical history of hemochromatosis with therapeutic phlebotomies, thrombocytopenia, hyperglycemia, hyperlipidemia, hypertension, AAA with status post aneurysm repair, arthritis, coronary artery disease with status post stent placement, blepharospasm, lumbar radiculopathy, stroke (per chart review), and severe obesity with a BMI of over 40, who reports snoring and excessive daytime somnolence.  There have been rare reports of witnessed apneas.  His Epworth sleepiness score is 14 out of 24, fatigue severity score is 39 out of 63.  I reviewed your office note from 02/20/2024. His symptoms of daytime somnolence have evolved over time, worse in the past year, he has noticed sleepiness at the wheel but denies ever falling asleep or dozing off at the wheel.  He drives trucks at times, has 2 travel for work, works for a [employer], engineering.  When he travels, he sometimes has to share a room with colleagues and he has been told that snoring can be loud and in the past he was also told that he had pauses in his breathing.  He is divorced, he lives alone, he has 4 grown children.  No pets in the household.  He goes to bed generally between 9 and 10 and rise time is between 4:30 AM and 5.  He has nocturia about 2 or 3 times per average night.  He is retired as a Visual merchandiser.  He quit smoking in 1995 but dips tobacco.  He drinks caffeine in the form of coffee, 2 cups in the mornings.  He drinks  alcohol daily, usually 1 shot, has reduced his alcohol consumption over time.  He denies recurrent nocturnal or morning headaches.  His weight has been fluctuating over the years.  Her Past Medical History Is Significant For: Past Medical History:  Diagnosis Date   AAA (abdominal aortic aneurysm) 08/2011   see Surgical history   Anginal pain    Arthritis    knees; not bad   CAD (coronary artery disease)    DES to RCA and CFX; Dr. Wonda cardiologist   Depression ~ 2009   'treated for ~ 6 months   Dyslipidemia    HTN (hypertension)    unspecified essential   Hx of adenomatous colonic polyps 2008   Hyperplasia of prostate without lower urinary tract symptoms (LUTS)    Left lumbar radiculopathy    Other and unspecified hyperlipidemia    Stroke [facility])     His Past Surgical History Is Significant For: Past Surgical History:  Procedure Laterality Date   ABDOMINAL AORTIC ANEURYSM REPAIR  09/06/2011   ABDOMINAL AORTIC ANEURYSM REPAIR  01/21/2012   Procedure: ANEURYSM ABDOMINAL AORTIC REPAIR;  Surgeon: Carlin FORBES Haddock, MD;  Location: [facility] OR;  Service: Vascular;  Laterality: N/A;  Aortio- bi-iliac repair  using Cryolife descending thoracic aorta 14mm x10.5 cm.   APPENDECTOMY  1970   ARTERY REPAIR  09/06/2011   Procedure: ARTERY REPAIR;  Surgeon: Carlin FORBES Haddock, MD;  Location: [facility] OR;  Service: Vascular;  Laterality: Bilateral;  COLONOSCOPY W/ POLYPECTOMY  2008   adenomatous  & hyperplastic polyp;due 2013   CORONARY ANGIOPLASTY WITH STENT PLACEMENT  2002; 2005   2 +1; total of 3   CYSTOSCOPY  2005   varicose veins in bladder, Dr matilda   TONSILLECTOMY AND ADENOIDECTOMY  1954   VASECTOMY      His Family History Is Significant For: Family History  Problem Relation Age of Onset   Other Mother        varicose veins   Coronary artery disease Father        CABG in 15s   Diabetes Father    Colon polyps Father    Lung cancer Father        non-small cell carcinoma/bone CA    Hypertension Father    Coronary artery disease Maternal Grandfather    Coronary artery disease Paternal Grandfather    Anesthesia problems Neg Hx    Colon cancer Neg Hx    Esophageal cancer Neg Hx    Liver disease Neg Hx    Sleep apnea Neg Hx     His Social History Is Significant For: Social History   Socioeconomic History   Marital status: Divorced    Spouse name: Not on file   Number of children: 4   Years of education: Not on file   Highest education level: Some college, no degree  Occupational History   Occupation: Farming  Tobacco Use   Smoking status: Former    Current packs/day: 0.00    Average packs/day: 1 pack/day for 27.0 years (27.0 ttl pk-yrs)    Types: Cigarettes    Start date: 05/31/1966    Quit date: 05/31/1993    Years since quitting: 30.8   Smokeless tobacco: Current    Types: Snuff   Tobacco comments:    smoked 1968-1995, up to 1 ppd. Currently using Skoal 1 can / day  Vaping Use   Vaping status: Never Used  Substance and Sexual Activity   Alcohol use: Yes    Alcohol/week: 7.0 standard drinks of alcohol    Types: 7 Shots of liquor per week   Drug use: No   Sexual activity: Yes  Other Topics Concern   Not on file  Social History Narrative   Pt lives alone    Pt works    Social Drivers of Corporate investment banker Strain: Low Risk  (02/16/2024)   Overall Financial Resource Strain (CARDIA)    Difficulty of Paying Living Expenses: Not hard at all  Food Insecurity: No Food Insecurity (02/16/2024)   Hunger Vital Sign    Worried About Running Out of Food in the Last Year: Never Alfred    Ran Out of Food in the Last Year: Never Alfred  Transportation Needs: No Transportation Needs (02/16/2024)   PRAPARE - Administrator, Civil Service (Medical): No    Lack of Transportation (Non-Medical): No  Physical Activity: Sufficiently Active (02/16/2024)   Exercise Vital Sign    Days of Exercise per Week: 5 days    Minutes of Exercise per Session: 60 min   Stress: No Stress Concern Present (02/16/2024)   Alfred [name] of Occupational Health - Occupational Stress Questionnaire    Feeling of Stress: Only a little  Social Connections: Moderately Integrated (02/16/2024)   Social Connection and Isolation Panel    Frequency of Communication with Friends and Family: More than three times a week    Frequency of Social Gatherings with Friends and Family: More than three  times a week    Attends Religious Services: 1 to 4 times per year    Active Member of Clubs or Organizations: Yes    Attends Banker Meetings: 1 to 4 times per year    Marital Status: Divorced    His Allergies Are:  No Known Allergies:   His Current Medications Are:  Outpatient Encounter Medications as of 03/15/2024  Medication Sig   acetaminophen  (TYLENOL ) 500 MG tablet Take 500 mg by mouth every 6 (six) hours as needed for mild pain. PRN for pain/fever   aspirin  81 MG tablet Take 81 mg by mouth at bedtime.   atorvastatin  (LIPITOR) 10 MG tablet TAKE 1 TABLET BY MOUTH EVERY DAY   clopidogrel  (PLAVIX ) 75 MG tablet TAKE 1 TABLET BY MOUTH EVERY DAY   metoprolol  succinate (TOPROL -XL) 50 MG 24 hr tablet TAKE 1 TABLET BY MOUTH EVERY DAY WITH OR IMMEDIATELY FOLLOWING A MEAL   pantoprazole  (PROTONIX ) 40 MG tablet Take 1 tablet (40 mg total) by mouth 2 (two) times daily. Take 40 mg twice a day for 4 week and then decrease to 40 mg daily   valsartan  (DIOVAN ) 160 MG tablet TAKE ONE (1) TABLET BY MOUTH (160 MG) DAILY   No facility-administered encounter medications on file as of 03/15/2024.  :   Review of Systems:  Out of a complete 14 point review of systems, all are reviewed and negative with the exception of these symptoms as listed below:  Review of Systems  Neurological:        Pt here for sleep consult Pt snores,fatigue,controlled BP pt denies sleep study,cpap machine,headaches       Objective:  Neurological Exam  Physical Exam Physical Examination:    Vitals:   03/15/24 0859  BP: 134/77  Pulse: (!) 53    General Examination: The patient is a very pleasant 73 y.o. male in no acute distress. He appears well-developed and well-nourished and well groomed.   HEENT: Normocephalic, atraumatic, pupils are equal, round and reactive to light, extraocular tracking is good without limitation to gaze excursion or nystagmus noted. No photophobia.  Corrective eye glasses in place. Hearing is grossly intact.  Face is symmetric with normal facial animation, with the exception of intermittent right lower face twitching in keeping with hemifacial spasm.  Of note, he has no pain, no history of Bell's palsy, has noticed this twitching intermittently in the past 3 or 4 months. Speech is clear without dysarthria. There is no hypophonia. There is no lip, neck/head, jaw or voice tremor. Neck is supple with full range of passive and active motion. There are no carotid bruits on auscultation.  Airway/Oropharynx exam reveals: mild mouth dryness, adequate dental hygiene and moderate airway crowding, due to small airway entry, thicker soft palate, larger uvula.  Tonsils absent.  Neck circumference 19 1/4 inches, minimal overbite noted. Tongue protrudes centrally and palate elevates symmetrically.   Chest: Clear to auscultation without wheezing, rhonchi or crackles noted.  Heart: S1+S2+0, regular and normal without murmurs, rubs or gallops noted.   Abdomen: Soft, non-tender and non-distended.  Extremities: There is trace pitting edema in the distal lower extremities bilaterally.   Skin: Warm and dry without trophic changes noted.   Musculoskeletal: exam reveals no obvious joint deformities.   Neurologically:  Mental status: The patient is awake, alert and oriented in all 4 spheres. His immediate and remote memory, attention, language skills and fund of knowledge are appropriate. There is no evidence of aphasia, agnosia, apraxia or anomia.  Speech is clear with  normal prosody and enunciation. Thought process is linear. Mood is normal and affect is normal.  Cranial nerves II - XII are as described above under HEENT exam.  Motor exam: Normal bulk, strength and tone is noted. There is no obvious action or resting tremor.  Fine motor skills and coordination: Intact grossly.  Cerebellar testing: No dysmetria or intention tremor. There is no truncal or gait ataxia.  Sensory exam: intact to light touch in the upper and lower extremities.  Gait, station and balance: He stands easily. No veering to one side is noted. No leaning to one side is noted. Posture is age-appropriate and stance is narrow based. Gait shows normal stride length and normal pace. No problems turning are noted.   Assessment and Plan:  In summary, Alfred [name] is a very pleasant 73 y.o.-year old male with an underlying complex medical history of hemochromatosis with therapeutic phlebotomies, thrombocytopenia, hyperglycemia, hyperlipidemia, hypertension, AAA with status post aneurysm repair, arthritis, coronary artery disease with status post stent placement, blepharospasm, lumbar radiculopathy, stroke (per chart review), and severe obesity with a BMI of over 40, whose history and physical exam are concerning for sleep disordered breathing, particularly obstructive sleep apnea (OSA). A laboratory attended sleep study is typically considered gold standard for evaluation of sleep disordered breathing.   I had a long chat with the patient about my findings and the diagnosis of sleep apnea, particularly OSA, its prognosis and treatment options. We talked about medical/conservative treatments, surgical interventions and non-pharmacological approaches for symptom control. I explained, in particular, the risks and ramifications of untreated moderate to severe OSA, especially with respect to developing cardiovascular disease down the road, including congestive heart failure (CHF), difficult to treat  hypertension, cardiac arrhythmias (particularly A-fib), neurovascular complications including TIA, stroke and dementia. Even type 2 diabetes has, in part, been linked to untreated OSA. Symptoms of untreated OSA may include (but may not be limited to) daytime sleepiness, nocturia (i.e. frequent nighttime urination), memory problems, mood irritability and suboptimally controlled or worsening mood disorder such as depression and/or anxiety, lack of energy, lack of motivation, physical discomfort, as well as recurrent headaches, especially morning or nocturnal headaches. We talked about the importance of maintaining a healthy lifestyle and striving for healthy weight.  The importance of complete tobacco cessation was also addressed.  In addition, we talked about the importance of striving for and maintaining good sleep hygiene.  He is encouraged to scale back on his daily alcohol consumption.  He is furthermore advised to monitor his facial twitching, if he would like to get evaluated for this formally, he can ask you for referral. I recommended a sleep study at this time. I outlined the differences between a laboratory attended sleep study which is considered more comprehensive and accurate over the option of a home sleep test (HST); the latter may lead to underestimation of sleep disordered breathing in some instances and does not help with diagnosing upper airway resistance syndrome and is not accurate enough to diagnose primary central sleep apnea typically. I outlined possible surgical and non-surgical treatment options of OSA, including the use of a positive airway pressure (PAP) device (i.e. CPAP, AutoPAP/APAP or BiPAP in certain circumstances), a custom-made dental device (aka oral appliance, which would require a referral to a specialist dentist or orthodontist typically, and is generally speaking not considered for patients with full dentures or edentulous state), upper airway surgical options, such as  traditional UPPP (which is not considered a first-line  treatment) or the Inspire device (hypoglossal nerve stimulator, which would involve a referral for consultation with an ENT surgeon, after careful selection, following inclusion criteria - also not first-line treatment). I explained the PAP treatment option to the patient in detail, as this is generally considered first-line treatment.  The patient indicated that he would be willing to try PAP therapy, if the need arises. I explained the importance of being compliant with PAP treatment, not only for insurance purposes but primarily to improve patient's symptoms symptoms, and for the patient's long term health benefit, including to reduce His cardiovascular risks longer-term.    We will pick up our discussion about the next steps and treatment options after testing.  We will keep hiim posted as to the test results by phone call and/or MyChart messaging where possible.  We will plan to follow-up in sleep clinic accordingly as well.  I answered all his questions today and the patient was in agreement.   I encouraged him to call with any interim questions, concerns, problems or updates or email us  through MyChart.  Generally speaking, sleep test authorizations may take up to 2 weeks, sometimes less, sometimes longer, the patient is encouraged to get in touch with us  if they do not hear back from the sleep lab staff directly within the next 2 weeks.  Thank you very much for allowing me to participate in the care of this nice patient. If I can be of any further assistance to you please do not hesitate to call me at [phone].  Sincerely,   Alfred Mar, MD, PhD

## 2024-03-23 NOTE — Progress Notes (Signed)
 SCHON ZEIDERS                                          MRN: [id number]   03/23/2024   The [facility]I Quality Team Specialist reviewed this patient medical record for the purposes of chart review for care gap closure. The following were reviewed: abstraction for care gap closure-controlling blood pressure.    [facility]BCI Quality Team

## 2024-03-26 ENCOUNTER — Telehealth: Payer: Self-pay | Admitting: Oncology

## 2024-03-28 ENCOUNTER — Telehealth: Payer: Self-pay | Admitting: Neurology

## 2024-03-28 ENCOUNTER — Encounter: Payer: Self-pay | Admitting: Oncology

## 2024-03-28 ENCOUNTER — Inpatient Hospital Stay

## 2024-03-28 LAB — HEMOGLOBIN AND HEMATOCRIT (CANCER CENTER ONLY)
HCT: 42.1 % (ref 39.0–52.0)
Hemoglobin: 14.5 g/dL (ref 13.0–17.0)

## 2024-03-28 LAB — FERRITIN: Ferritin: 500 ng/mL — ABNORMAL HIGH (ref 24–336)

## 2024-03-28 NOTE — Telephone Encounter (Signed)
 HTA NPSG pending

## 2024-03-29 ENCOUNTER — Inpatient Hospital Stay

## 2024-03-29 NOTE — Progress Notes (Signed)
 Alfred [name] presents today for phlebotomy per MD orders. Phlebotomy procedure started at 1551 and ended at 1559. 509 grams removed via 16G RAC Patient observed for 20 minutes after procedure without any incident. Patient tolerated procedure well. Beverage and snack provided. IV needle removed intact.

## 2024-04-09 NOTE — Telephone Encounter (Signed)
 NPSG HTA shara: [id number] (exp. 03/28/24 to 06/26/24)

## 2024-04-18 NOTE — Telephone Encounter (Signed)
 NPSG HTA shara: [id number] (exp. 03/28/24 to 06/26/24)   Patient is scheduled at [facility] for 06/04/2024 at 8 pm.  Mailed packet and sent mychart

## 2024-05-02 ENCOUNTER — Inpatient Hospital Stay: Attending: Oncology

## 2024-05-02 LAB — HEMOGLOBIN AND HEMATOCRIT (CANCER CENTER ONLY)
HCT: 45.3 % (ref 39.0–52.0)
Hemoglobin: 15 g/dL (ref 13.0–17.0)

## 2024-05-02 LAB — FERRITIN: Ferritin: 465 ng/mL — ABNORMAL HIGH (ref 24–336)

## 2024-05-03 ENCOUNTER — Inpatient Hospital Stay

## 2024-05-03 ENCOUNTER — Encounter

## 2024-05-03 NOTE — Patient Instructions (Signed)

## 2024-05-03 NOTE — Progress Notes (Signed)
 Alfred [name] presents today for phlebotomy per MD orders. Phlebotomy procedure started at 1528 and ended at 1535. 520 grams removed. 16G phlebotomy kit used to L AC.  Patient observed for 10 minutes after procedure without any incident. Patient tolerated procedure well. IV needle removed intact.

## 2024-06-04 ENCOUNTER — Ambulatory Visit: Admitting: Neurology

## 2024-06-04 DIAGNOSIS — R0683 Snoring: Secondary | ICD-10-CM

## 2024-06-04 DIAGNOSIS — G4733 Obstructive sleep apnea (adult) (pediatric): Secondary | ICD-10-CM

## 2024-06-04 DIAGNOSIS — G4719 Other hypersomnia: Secondary | ICD-10-CM

## 2024-06-04 DIAGNOSIS — I2510 Atherosclerotic heart disease of native coronary artery without angina pectoris: Secondary | ICD-10-CM

## 2024-06-04 DIAGNOSIS — R9431 Abnormal electrocardiogram [ECG] [EKG]: Secondary | ICD-10-CM

## 2024-06-04 DIAGNOSIS — Z955 Presence of coronary angioplasty implant and graft: Secondary | ICD-10-CM

## 2024-06-04 DIAGNOSIS — G4720 Circadian rhythm sleep disorder, unspecified type: Secondary | ICD-10-CM

## 2024-06-04 DIAGNOSIS — R351 Nocturia: Secondary | ICD-10-CM

## 2024-06-04 DIAGNOSIS — Z9189 Other specified personal risk factors, not elsewhere classified: Secondary | ICD-10-CM

## 2024-06-04 DIAGNOSIS — R0681 Apnea, not elsewhere classified: Secondary | ICD-10-CM

## 2024-06-05 NOTE — Procedures (Unsigned)
 Physician Interpretation: Please see link under Procedure Tab or under Encounters tab for physician report, technical report, as well as O2 titration and/or PAP titration tables (if applicable).

## 2024-06-06 ENCOUNTER — Ambulatory Visit: Payer: Self-pay | Admitting: Neurology

## 2024-06-06 ENCOUNTER — Encounter

## 2024-06-06 ENCOUNTER — Inpatient Hospital Stay: Attending: Oncology

## 2024-06-06 DIAGNOSIS — G4733 Obstructive sleep apnea (adult) (pediatric): Secondary | ICD-10-CM

## 2024-06-06 LAB — FERRITIN: Ferritin: 271 ng/mL (ref 24–336)

## 2024-06-06 LAB — HEMOGLOBIN AND HEMATOCRIT (CANCER CENTER ONLY)
HCT: 43.5 % (ref 39.0–52.0)
Hemoglobin: 14.6 g/dL (ref 13.0–17.0)

## 2024-06-07 ENCOUNTER — Encounter: Payer: Self-pay | Admitting: Cardiovascular Disease

## 2024-06-07 ENCOUNTER — Inpatient Hospital Stay

## 2024-06-07 NOTE — Progress Notes (Signed)
 Alfred [name] presents today for phlebotomy per MD orders. Ferritin 271, Hgb 14.6. Phlebotomy procedure started at 1521 and ended at 1527. 490 grams removed via LAC 16G Patient observed for 15 minutes after procedure without any incident. Patient tolerated procedure well. IV needle removed intact.

## 2024-06-07 NOTE — Patient Instructions (Signed)

## 2024-06-09 ENCOUNTER — Encounter: Payer: Self-pay | Admitting: Neurology

## 2024-06-11 NOTE — Telephone Encounter (Signed)
 Spoke to patient gave sleep study results Pt aware  urgent set up due to severe OSA  Gave patient  Adapt # Pt aware of insurance compliance Made pt f/u visit with Sarah,NP 07/2024 Sent orders to adapt and forward sleep study results to PCP this am Pt expressed understanding and thanked me for calling

## 2024-06-11 NOTE — Telephone Encounter (Signed)
-----   Message from True Mar, MD sent at 06/06/2024  6:12 PM EST ----- Urgent set up requested on PAP therapy, due to severe OSA. Patient referred by PCP, seen by me on 03/15/24, patient had a split night sleep study on 06/04/24. Please call and notify patient that the recent sleep study showed severe obstructive sleep apnea (OSA). He did well with BiPAP therapy during the study with significant improvement of the respiratory  events. I would like start the patient on a BiPAP machine for home use. I placed the order in the chart.  Please advise patient that we will need a follow up appointment with either myself or one of our nurse practitioners in about 2-3 months post set-up to check for how they are doing on treatment and  how well it's going with the machine in general. Most insurance company require a certain compliance percentage to continue to cover/pay for the machine. Please ask patient to schedule this FU  appointment, according to the set-up date, which is the day they receive the machine. Please make sure, the patient understands the importance of keeping this window for the FU appointment, as it is  often an barista and not our rule. Failing to adhere to this may result in losing coverage for sleep apnea treatment, at which point some insurances require repeating the whole process.  Plus, monitoring compliance data is usually good feedback for the patient as far as how they are doing, how many hours they are on it, how well the mask fits, etc.  Also remind patient, that any PAP machine or mask issues should be first addressed with the DME company, who provided the machine/supplies.  Please ask if patient has a preference regarding DME company, may depend on the insurance too.  True Mar, MD, PhD [facility])

## 2024-06-11 NOTE — Telephone Encounter (Signed)
 Please review result phone note

## 2024-07-03 ENCOUNTER — Telehealth: Payer: Self-pay | Admitting: Neurology

## 2024-07-03 DIAGNOSIS — Z789 Other specified health status: Secondary | ICD-10-CM

## 2024-07-03 DIAGNOSIS — G4733 Obstructive sleep apnea (adult) (pediatric): Secondary | ICD-10-CM

## 2024-07-03 NOTE — Telephone Encounter (Signed)
 Prt called to request an order be place to get pressured lower on Cpap Machine . Pt stated pressure to high  he can t sleep

## 2024-07-04 NOTE — Telephone Encounter (Signed)
 Orders sent to adapt this am Pt aware of pressure change.

## 2024-07-04 NOTE — Telephone Encounter (Signed)
 Will reduce pressure from 21/17 cm to 19/15 cm.

## 2024-07-04 NOTE — Telephone Encounter (Signed)
 Pt states too much air pressure coming from mask .

## 2024-07-10 ENCOUNTER — Ambulatory Visit: Payer: PPO

## 2024-07-10 ENCOUNTER — Inpatient Hospital Stay: Attending: Oncology

## 2024-07-11 ENCOUNTER — Inpatient Hospital Stay: Admitting: Oncology

## 2024-08-02 ENCOUNTER — Encounter: Admitting: Neurology

## 2024-08-15 ENCOUNTER — Ambulatory Visit: Admitting: Gastroenterology

## 2024-10-26 ENCOUNTER — Ambulatory Visit: Admitting: Cardiovascular Disease
